# Patient Record
Sex: Male | Born: 1960 | Race: Black or African American | Hispanic: No | Marital: Single | Smoking: Former smoker
Health system: Southern US, Community
[De-identification: ages and names within clinical notes are randomized; demographics above are authoritative.]

## PROBLEM LIST (undated history)

## (undated) DIAGNOSIS — K922 Gastrointestinal hemorrhage, unspecified: Secondary | ICD-10-CM

## (undated) DIAGNOSIS — I517 Cardiomegaly: Secondary | ICD-10-CM

## (undated) DIAGNOSIS — D649 Anemia, unspecified: Secondary | ICD-10-CM

## (undated) DIAGNOSIS — M549 Dorsalgia, unspecified: Secondary | ICD-10-CM

## (undated) DIAGNOSIS — S82892A Other fracture of left lower leg, initial encounter for closed fracture: Secondary | ICD-10-CM

## (undated) DIAGNOSIS — I10 Essential (primary) hypertension: Secondary | ICD-10-CM

## (undated) DIAGNOSIS — N289 Disorder of kidney and ureter, unspecified: Secondary | ICD-10-CM

## (undated) DIAGNOSIS — D696 Thrombocytopenia, unspecified: Secondary | ICD-10-CM

## (undated) DIAGNOSIS — I8500 Esophageal varices without bleeding: Secondary | ICD-10-CM

## (undated) DIAGNOSIS — K759 Inflammatory liver disease, unspecified: Secondary | ICD-10-CM

## (undated) DIAGNOSIS — Z95828 Presence of other vascular implants and grafts: Secondary | ICD-10-CM

## (undated) DIAGNOSIS — D689 Coagulation defect, unspecified: Secondary | ICD-10-CM

## (undated) DIAGNOSIS — K7460 Unspecified cirrhosis of liver: Secondary | ICD-10-CM

## (undated) DIAGNOSIS — F319 Bipolar disorder, unspecified: Secondary | ICD-10-CM

## (undated) HISTORY — PX: HERNIA REPAIR: SHX51

## (undated) HISTORY — DX: Esophageal varices without bleeding: I85.00

## (undated) HISTORY — DX: Unspecified cirrhosis of liver: K74.60

## (undated) HISTORY — DX: Gastrointestinal hemorrhage, unspecified: K92.2

---

## 1989-08-10 HISTORY — PX: EYE SURGERY: SHX253

## 1998-12-13 ENCOUNTER — Emergency Department (HOSPITAL_COMMUNITY): Admission: EM | Admit: 1998-12-13 | Discharge: 1998-12-13 | Payer: Self-pay | Admitting: Emergency Medicine

## 2002-10-08 ENCOUNTER — Emergency Department (HOSPITAL_COMMUNITY): Admission: EM | Admit: 2002-10-08 | Discharge: 2002-10-08 | Payer: Self-pay | Admitting: Emergency Medicine

## 2004-04-10 ENCOUNTER — Emergency Department (HOSPITAL_COMMUNITY): Admission: EM | Admit: 2004-04-10 | Discharge: 2004-04-10 | Payer: Self-pay

## 2008-03-16 ENCOUNTER — Emergency Department (HOSPITAL_COMMUNITY): Admission: EM | Admit: 2008-03-16 | Discharge: 2008-03-16 | Payer: Self-pay | Admitting: Emergency Medicine

## 2008-03-30 ENCOUNTER — Emergency Department (HOSPITAL_COMMUNITY): Admission: EM | Admit: 2008-03-30 | Discharge: 2008-03-30 | Payer: Self-pay | Admitting: Emergency Medicine

## 2008-10-10 DIAGNOSIS — S82892A Other fracture of left lower leg, initial encounter for closed fracture: Secondary | ICD-10-CM

## 2008-10-10 HISTORY — DX: Other fracture of left lower leg, initial encounter for closed fracture: S82.892A

## 2008-10-21 ENCOUNTER — Emergency Department (HOSPITAL_COMMUNITY): Admission: EM | Admit: 2008-10-21 | Discharge: 2008-10-21 | Payer: Self-pay | Admitting: Emergency Medicine

## 2008-11-01 ENCOUNTER — Ambulatory Visit (HOSPITAL_COMMUNITY): Admission: RE | Admit: 2008-11-01 | Discharge: 2008-11-01 | Payer: Self-pay | Admitting: Orthopedic Surgery

## 2010-08-06 ENCOUNTER — Emergency Department (HOSPITAL_COMMUNITY): Admission: EM | Admit: 2010-08-06 | Discharge: 2010-08-06 | Payer: Self-pay | Admitting: Emergency Medicine

## 2011-02-22 LAB — CBC
HCT: 39.9 % (ref 39.0–52.0)
Hemoglobin: 14.2 g/dL (ref 13.0–17.0)
RBC: 4.51 MIL/uL (ref 4.22–5.81)
RDW: 14.8 % (ref 11.5–15.5)

## 2011-02-22 LAB — POCT CARDIAC MARKERS
CKMB, poc: 4.9 ng/mL (ref 1.0–8.0)
Myoglobin, poc: 155 ng/mL (ref 12–200)
Troponin i, poc: <0.05 ng/mL (ref 0.00–0.09)

## 2011-02-22 LAB — POCT I-STAT, CHEM 8
BUN: <3 mg/dL — ABNORMAL LOW (ref 6–23)
Chloride: 108 mEq/L (ref 96–112)
Glucose, Bld: 113 mg/dL — ABNORMAL HIGH (ref 70–99)
HCT: 44 % (ref 39.0–52.0)
Sodium: 145 mEq/L (ref 135–145)

## 2011-02-22 LAB — DIFFERENTIAL
Basophils Relative: 1 % (ref 0–1)
Eosinophils Absolute: 0.1 10*3/uL (ref 0.0–0.7)
Monocytes Absolute: 0.4 10*3/uL (ref 0.1–1.0)
Monocytes Relative: 7 % (ref 3–12)
Neutrophils Relative %: 76 % (ref 43–77)

## 2011-04-24 NOTE — Consult Note (Signed)
Edwin Love, Edwin Love NO.:  [id number]   MEDICAL RECORD NO.:  [id number]          PATIENT TYPE:  EMS   LOCATION:  [facility]                         FACILITY:  [facility]   PHYSICIAN:  Mearl Latin, PA       DATE OF BIRTH:  12-06-1961   DATE OF CONSULTATION:  10/21/2008  DATE OF DISCHARGE:                                 CONSULTATION   REQUESTING PHYSICIAN:  Marisa Severin, MD   REASON FOR CONSULTATION:  Left ankle fracture.   HISTORY OF PRESENT ILLNESS:  [name] is a 50 year old African-  American male who presents to the emergency room today with complaints  of left ankle pain.  [name] states at approximately one week ago  he was walking on his front lawn when he stepped into an uneven surface,  most likely a path hole in the ground and subsequently rolled his ankle  resulting in an injury.  [name] initially thought nothing of it  and did not go to the emergency room, wanted to have it evaluated  despite his mom encouraging him to have it assessed.  Over the ensuing  week, his pain continued to get worse and today, he was walking on it  when he fell again, which reinjured the left ankle.  Currently, Mr.  Rozo presents to the emergency room for evaluation of his injury.  X-  rays of the left ankle obtained in the ED demonstrated a left ankle  fracture.  Currently, [name] states that his pain is approximately  6-7/10.  It is worse with movement and it is located primarily about his  left ankle.  He does get some relief when he is at rest and pain  medications do help his pain.  As noted, his pain has gotten worse over  the ensuing last week and was exacerbated with his recent fall today.  Pain is described as sharp at times around the left ankle.  No radiation  of pain up to his leg or down into his foot.  He does note that his  ankle is swollen as well.  [name] denies any chest pain, shortness  of breath.  No nausea, vomiting, or diarrhea.   No headaches.  No fevers.  No chills.  No recent weight loss.  Positive left ankle pain.   PAST MEDICAL HISTORY:  Significant for uncontrolled hypertension.   MEDICATIONS:  None.   ALLERGIES:  No known drug allergies.   SURGICAL HISTORY:  Traumatic injury to left globe with subsequent  resection.   FAMILY HISTORY:  Mom with diabetes.  Sister with sickle cell anemia.   SOCIAL HISTORY:  Mr. Bolender does use tobacco products and has done so  for many years and does not get a specific timeframe, states that he  used approximately 1 to 2 packs of cigarettes per day and smoke more  when he is drinking.  He does admit to marijuana use.  Chronic alcohol  use, drinks approximately 6 packs per day, and has not had any alcohol  today.  The patient is unemployed, but did work for [employer] in  the past.  The  patient does live at home with his mom.   PHYSICAL EXAMINATION:  VITAL SIGNS:  Temperature 98.1, heart rate 85, BP  160/90, respirations 16 and 100% on room air.  No labs.  GENERAL:  Mr. Staples is a 50 year old African American male who does  appear to be older than his stated age.  He does appear to be well  nourished.  HEENT:  Atraumatic and normocephalic.  Moist mucous membranes.  Poor  dentition.  His left eye has been removed.  Right eye, extraocular  muscles are intact.  NECK:  Supple.  No lymphadenopathy.  No spinous process tenderness.  The  patient demonstrates full range of motion of C-spine.  LUNGS:  Clear to auscultation bilaterally.  HEART:  Regular rate and rhythm.  ABDOMEN:  Soft and nontender.  Positive bowel sounds.  PELVIS:  Stable without pain with AP and lateral compression of ASIS or  iliac crest.  EXTREMITIES:  Bilateral upper extremities, no gross deformities noted.  The patient demonstrates full active range of motion at the hand, wrist,  elbow, and shoulder joints.  Strength is also appropriate at 5/5.  Sensation along the radial, ulnar, and median  nerves is intact to light  touch bilaterally.  Motor function along the radial, ulnar, and median  nerves is also intact.  There is palpable radial pulses bilaterally.  The extremities were warm and skin is appropriate color and temperature.  Right lower extremity, no gross deformities noted.  The patient  demonstrates good range of motion with ankle motion, knee motion, and  hip motion.  No pain is elicited with palpation of the soft tissue of  the joint.  No crepitus or back motion is appreciated with passive  ranging.  Motor and sensory functions on the right leg are also intact  along the deep peroneal nerve, superficial peroneal nerve, and tibial  nerve.  There is a palpable dorsalis pedis pulse.  The extremity is warm  and appropriate color and temperature.  Of note, the patient's nails  demonstrates significant degree of onycholysis.  Left lower extremity,  left hip and left knee are unremarkable.  They are nontender to  palpation, demonstrate good range of motion within satisfactory  limitations.  No tenderness is elicited with palpation of the hip or the  left knee.  Thigh is also nontender to palpation.  Initial inspection of  the left ankle demonstrates a significant swelling with some ecchymosis  appreciated along the lateral aspect.  The patient does have significant  tenderness of palpation over the lateral aspect of the left ankle, which  is more painful than palpation over the medial aspect.  Significant  tenderness elicited with palpation of the lateral malleolus and some  tenderness appreciated with palpation over the distal tib of the medial  malleolus.  No significant tenderness elicited with palpation of the  distal tibia in the midline.  The patient does have pain with gentle  passive range of motion of the left ankle as well.  His skin does not  wrinkle satisfactorily both medial and laterally.  Sensation along the  deep peroneal nerve, superficial peroneal nerve,  and tibial nerve is  intact to light touch.  Extensor hallucis longus and flexor hallucis  longus function is intact.  Grossly anterior tibialis, posterior  tibialis, peroneals, and gastroc are also intact.  There was a palpable  dorsalis pedis and posterior tibialis pulse.  The extremity is warm.  Again, the patient has significant degree of onycholysis of his  toenails.  The patient does not complain of any numbness or tingling.   X-RAYS:  Three views of the left ankle demonstrate a spiral-type  fracture of the left distal fibula with some extension in the fibula  shaft.  There is no obvious fracture of the medial malleolus; however,  there is lateral displacement of the talus with regards to the ankle  mortise.  There is also appreciable avulsion of the lateral talar  process.  There is significant medial clear space widening as well as  tibiofibular overlap approximately 10.2 mm on AP radiographs and no  appreciable overlap on the oblique films.   ASSESSMENT AND PLAN:  This is a 50 year old African American male with a  left ankle fracture/bimalleolar equivalent/supination-external rotation.  OTA classification 44 - B2.  1. The patient was placed in a posterior stirrup splint to hold      reduction in place secondary to soft tissue swelling.  The patient      is not a candidate for open reduction and internal fixation at this      time.  We will have the patient followup in the office      approximately 1 week for reevaluation of the soft tissue.  2. Postreduction films are pending.  3. Nonweightbearing left lower extremity.  The patient will ambulate      with crutches.  4. The patient is fairly comfortable, given delayed presentation;      therefore, we feel that it is unnecessary to admit the patient to      the hospital at this time.  The patient would be more comfortable      at home.  5. Strict ice and elevation.  The patient instructed to elevate his      left lower  extremity above his heart.  The patient was also      instructed on proper splint care.  The patient is to avoid getting      the splint wet and dirty.  I have informed Mr. Cardosa that should      the splint get wet, he runs a risk of splint breakdown as well as      maceration of the skin secondary to wet padding material against      his skin.  6. The patient is also to contact his PCP regarding his elevated blood      pressure.  We will not proceed the surgery until this is fairly      controlled.  7. Provided the patient with a script for Percocet for his pain      control as well as oxycodone for breakthrough pain.  8. The patient will follow up within 1 week.  He is to call our office      to schedule an appointment, number is [phone].  The patient was      seen and evaluated in the emergency room with Dr. Carola Frost.      Mearl Latin, PA     KWP/MEDQ  D:  10/21/2008  T:  10/22/2008  Job:  [id number]

## 2011-09-04 LAB — RAPID URINE DRUG SCREEN, HOSP PERFORMED
Benzodiazepines: NOT DETECTED
Cocaine: NOT DETECTED
Opiates: NOT DETECTED

## 2011-09-04 LAB — DIFFERENTIAL
Basophils Absolute: 0
Basophils Relative: 0
Eosinophils Absolute: 0.1
Eosinophils Relative: 1
Lymphs Abs: 1.7
Monocytes Absolute: 0.4
Monocytes Relative: 4

## 2011-09-04 LAB — BASIC METABOLIC PANEL
BUN: 32 — ABNORMAL HIGH
CO2: 20
Creatinine, Ser: 1.07
GFR calc Af Amer: >60
Glucose, Bld: 152 — ABNORMAL HIGH

## 2011-09-04 LAB — CBC
Hemoglobin: 13.2
WBC: 10.9 — ABNORMAL HIGH

## 2011-09-04 LAB — PROTIME-INR: Prothrombin Time: 17.6 — ABNORMAL HIGH

## 2011-09-11 LAB — COMPREHENSIVE METABOLIC PANEL
ALT: 41
AST: 77 — ABNORMAL HIGH
Alkaline Phosphatase: 113
CO2: 25
Chloride: 107
Creatinine, Ser: 0.8
GFR calc Af Amer: >60
Sodium: 140
Total Bilirubin: 1.3 — ABNORMAL HIGH

## 2011-09-11 LAB — CBC
Hemoglobin: 12.6 — ABNORMAL LOW
MCHC: 32.7
MCV: 85.2
RBC: 4.52
RDW: 18.2 — ABNORMAL HIGH

## 2011-09-11 LAB — URINE CULTURE: Colony Count: 25000

## 2011-09-11 LAB — APTT: aPTT: 37

## 2011-09-11 LAB — DIFFERENTIAL
Eosinophils Absolute: 0.1
Lymphs Abs: 2.4
Monocytes Absolute: 0.8
Neutro Abs: 7.9 — ABNORMAL HIGH
Neutrophils Relative %: 70

## 2011-09-11 LAB — URINALYSIS, ROUTINE W REFLEX MICROSCOPIC
Specific Gravity, Urine: 1.02
pH: 6

## 2011-09-11 LAB — URINE MICROSCOPIC-ADD ON

## 2012-09-20 ENCOUNTER — Emergency Department (HOSPITAL_COMMUNITY): Payer: Self-pay

## 2012-09-20 ENCOUNTER — Encounter (HOSPITAL_COMMUNITY): Payer: Self-pay | Admitting: Emergency Medicine

## 2012-09-20 ENCOUNTER — Emergency Department (HOSPITAL_COMMUNITY)
Admission: EM | Admit: 2012-09-20 | Discharge: 2012-09-20 | Disposition: A | Discharge status: Home / Self Care | Payer: Self-pay | Attending: Emergency Medicine | Admitting: Emergency Medicine

## 2012-09-20 DIAGNOSIS — Z8781 Personal history of (healed) traumatic fracture: Secondary | ICD-10-CM | POA: Insufficient documentation

## 2012-09-20 DIAGNOSIS — M79673 Pain in unspecified foot: Secondary | ICD-10-CM

## 2012-09-20 DIAGNOSIS — M79609 Pain in unspecified limb: Secondary | ICD-10-CM | POA: Insufficient documentation

## 2012-09-20 HISTORY — DX: Essential (primary) hypertension: I10

## 2012-09-20 MED ORDER — IBUPROFEN 600 MG PO TABS: 600.0000 mg | ORAL_TABLET | Freq: Four times a day (QID) | ORAL | Status: DC | PRN

## 2012-09-20 MED ORDER — HYDROCODONE-ACETAMINOPHEN 5-325 MG PO TABS: 1.0000 | ORAL_TABLET | Freq: Four times a day (QID) | ORAL | Status: DC | PRN

## 2012-09-20 NOTE — ED Notes (Signed)
Pt returned from radiology.

## 2012-09-20 NOTE — ED Provider Notes (Signed)
History    This chart was scribed for Edwin Kaplan, MD, MD by Smitty Pluck. The patient was seen in room TR09C and the patient's care was started at 7:16PM.  CSN: [id number]  Arrival date & time 09/20/12  1423    Chief Complaint  Patient presents with  . Foot Pain    (Consider location/radiation/quality/duration/timing/severity/associated sxs/prior treatment) Patient is a 51 y.o. male presenting with lower extremity pain. The history is provided by the patient. No language interpreter was used.  Foot Pain   URHO RIO is a 51 y.o. male who presents to the Emergency Department complaining of constant, moderate left foot pain in the bunion region and first 2-3 toes onset  Week. Bearing weight aggravates the pain. Pt reports that he has had calluses removed and tried foot cream without relief. Denies hx of substance abuse, liver disease and gout . Pt reports that he drinks beer daily and smokes cigarettes. Reports having intermittent, right wrist pain. Denies any other pain.    Past Medical History  Diagnosis Date  . Hypertension     History reviewed. No pertinent past surgical history.  No family history on file.  History  Substance Use Topics  . Smoking status: Current Every Day Smoker  . Smokeless tobacco: Not on file  . Alcohol Use: Yes      Review of Systems  Musculoskeletal: Positive for joint swelling.  All other systems reviewed and are negative.    Allergies  Review of patient's allergies indicates no known allergies.  Home Medications  No current outpatient prescriptions on file.  BP 144/95  Pulse 69  Temp 98.1 F (36.7 C) (Oral)  Resp 20  SpO2 91%  Physical Exam  Nursing note and vitals reviewed. Constitutional: He is oriented to person, place, and time. He appears well-developed and well-nourished. No distress.  HENT:  Head: Normocephalic and atraumatic.  Neck: Normal range of motion.  Cardiovascular: Normal rate, regular rhythm  and normal heart sounds.   No murmur heard. Pulmonary/Chest: Effort normal. No respiratory distress.  Musculoskeletal:       Right wrist on dorsal aspect 2-3 cm lesion soft mobile and moves with flexion and extension of wrist. +2 radial pulse  +2 DP  Tenderness over metatarsal MTP joint and base of foot No erythema No crepitus No fluctuance The left great toe is nontender to palpation Left great toe 2 and 3 are tender to palpation but without erythema   Neurological: He is alert and oriented to person, place, and time.  Skin: Skin is warm and dry.  Psychiatric: He has a normal mood and affect. His behavior is normal.    ED Course  Procedures (including critical care time) DIAGNOSTIC STUDIES: Oxygen Saturation is 91% on room air, normal by my interpretation.    COORDINATION OF CARE: 7:23 PM Discussed ED treatment with pt     Labs Reviewed - No data to display No results found.   No diagnosis found.    MDM  Medical screening examination/treatment/procedure(s) were performed by me as the supervising physician. Scribe service was utilized for documentation only.  Pt exam is benign, but he has some risk factors for infection and we will get Xray to ensure there is no underlying pathology.         Edwin Kaplan, MD 10/11/12 [phone]

## 2012-09-20 NOTE — ED Notes (Signed)
Pt. Stated, I've had foot pain for a week or so.  I went to [facility]. He took some calluses off but its still hurting anytime I put pressure on it.

## 2012-09-20 NOTE — Progress Notes (Signed)
Orthopedic Tech Progress Note Patient Details:  Edwin Love 29-Aug-1961 [id number]  Ortho Devices Type of Ortho Device: Postop boot Ortho Device/Splint Location: left foot Ortho Device/Splint Interventions: Application   [name] 09/20/2012, 9:08 PM

## 2012-09-21 NOTE — ED Provider Notes (Signed)
Edwin Love S   8:20PM Pt discussed in sign out with Dr. Rhunette Croft.  Pt with complaints of foot pain.  X-rays pending.  X-rays with no new acute injury.  Pt with old fx.  Pt stable for d/c home.    Angus Seller, [place] 09/21/12 2224

## 2012-09-22 NOTE — ED Provider Notes (Signed)
Medical screening examination/treatment/procedure(s) were performed by non-physician practitioner and as supervising physician I was immediately available for consultation/collaboration.  Doug Sou, MD 09/22/12 [phone]

## 2014-01-10 DIAGNOSIS — D649 Anemia, unspecified: Secondary | ICD-10-CM

## 2014-01-10 HISTORY — DX: Anemia, unspecified: D64.9

## 2014-01-21 ENCOUNTER — Encounter (HOSPITAL_COMMUNITY): Payer: Self-pay | Admitting: Emergency Medicine

## 2014-01-21 ENCOUNTER — Other Ambulatory Visit: Payer: Self-pay

## 2014-01-21 ENCOUNTER — Emergency Department (HOSPITAL_COMMUNITY)
Admission: EM | Admit: 2014-01-21 | Discharge: 2014-01-21 | Disposition: A | Discharge status: Home / Self Care | Payer: Medicaid Other | Attending: Emergency Medicine | Admitting: Emergency Medicine

## 2014-01-21 ENCOUNTER — Emergency Department (HOSPITAL_COMMUNITY): Payer: Medicaid Other

## 2014-01-21 DIAGNOSIS — Z8679 Personal history of other diseases of the circulatory system: Secondary | ICD-10-CM | POA: Insufficient documentation

## 2014-01-21 DIAGNOSIS — R079 Chest pain, unspecified: Secondary | ICD-10-CM | POA: Insufficient documentation

## 2014-01-21 DIAGNOSIS — R0602 Shortness of breath: Secondary | ICD-10-CM | POA: Insufficient documentation

## 2014-01-21 DIAGNOSIS — Z791 Long term (current) use of non-steroidal anti-inflammatories (NSAID): Secondary | ICD-10-CM | POA: Insufficient documentation

## 2014-01-21 DIAGNOSIS — F1210 Cannabis abuse, uncomplicated: Secondary | ICD-10-CM | POA: Insufficient documentation

## 2014-01-21 DIAGNOSIS — F17200 Nicotine dependence, unspecified, uncomplicated: Secondary | ICD-10-CM | POA: Insufficient documentation

## 2014-01-21 DIAGNOSIS — D649 Anemia, unspecified: Secondary | ICD-10-CM

## 2014-01-21 DIAGNOSIS — I10 Essential (primary) hypertension: Secondary | ICD-10-CM | POA: Insufficient documentation

## 2014-01-21 DIAGNOSIS — N289 Disorder of kidney and ureter, unspecified: Secondary | ICD-10-CM | POA: Insufficient documentation

## 2014-01-21 DIAGNOSIS — D509 Iron deficiency anemia, unspecified: Secondary | ICD-10-CM | POA: Insufficient documentation

## 2014-01-21 DIAGNOSIS — I517 Cardiomegaly: Secondary | ICD-10-CM

## 2014-01-21 DIAGNOSIS — R040 Epistaxis: Secondary | ICD-10-CM | POA: Insufficient documentation

## 2014-01-21 DIAGNOSIS — M549 Dorsalgia, unspecified: Secondary | ICD-10-CM | POA: Insufficient documentation

## 2014-01-21 HISTORY — DX: Dorsalgia, unspecified: M54.9

## 2014-01-21 LAB — RPR: RPR: NONREACTIVE

## 2014-01-21 LAB — CBC
HEMATOCRIT: 29.3 % — AB (ref 39.0–52.0)
HEMOGLOBIN: 10.3 g/dL — AB (ref 13.0–17.0)
MCH: 25.3 pg — AB (ref 26.0–34.0)
MCHC: 35.2 g/dL (ref 30.0–36.0)
MCV: 72 fL — ABNORMAL LOW (ref 78.0–100.0)
Platelets: 315 10*3/uL (ref 150–400)
RBC: 4.07 MIL/uL — AB (ref 4.22–5.81)
RDW: 17.9 % — ABNORMAL HIGH (ref 11.5–15.5)
WBC: 9.3 10*3/uL (ref 4.0–10.5)

## 2014-01-21 LAB — BASIC METABOLIC PANEL
BUN: 16 mg/dL (ref 6–23)
BUN: 17 mg/dL (ref 6–23)
CALCIUM: 8.5 mg/dL (ref 8.4–10.5)
CHLORIDE: 108 meq/L (ref 96–112)
CO2: 13 meq/L — AB (ref 19–32)
CO2: 16 mEq/L — ABNORMAL LOW (ref 19–32)
Calcium: 8.2 mg/dL — ABNORMAL LOW (ref 8.4–10.5)
Chloride: 106 mEq/L (ref 96–112)
Creatinine, Ser: 1.4 mg/dL — ABNORMAL HIGH (ref 0.50–1.35)
Creatinine, Ser: 1.41 mg/dL — ABNORMAL HIGH (ref 0.50–1.35)
GFR calc Af Amer: 65 mL/min — ABNORMAL LOW (ref >90)
GFR calc non Af Amer: 56 mL/min — ABNORMAL LOW (ref >90)
GFR, EST AFRICAN AMERICAN: 64 mL/min — AB (ref >90)
GFR, EST NON AFRICAN AMERICAN: 55 mL/min — AB (ref >90)
GLUCOSE: 84 mg/dL (ref 70–99)
Glucose, Bld: 109 mg/dL — ABNORMAL HIGH (ref 70–99)
POTASSIUM: 4.1 meq/L (ref 3.7–5.3)
POTASSIUM: 4.3 meq/L (ref 3.7–5.3)
SODIUM: 137 meq/L (ref 137–147)
SODIUM: 139 meq/L (ref 137–147)

## 2014-01-21 LAB — POCT I-STAT TROPONIN I
TROPONIN I, POC: 0.02 ng/mL (ref 0.00–0.08)
TROPONIN I, POC: 0.01 ng/mL (ref 0.00–0.08)

## 2014-01-21 LAB — PRO B NATRIURETIC PEPTIDE: Pro B Natriuretic peptide (BNP): 350.3 pg/mL — ABNORMAL HIGH (ref 0–125)

## 2014-01-21 LAB — OCCULT BLOOD, POC DEVICE: FECAL OCCULT BLD: NEGATIVE

## 2014-01-21 LAB — CG4 I-STAT (LACTIC ACID): Lactic Acid, Venous: 1.12 mmol/L (ref 0.5–2.2)

## 2014-01-21 LAB — SALICYLATE LEVEL

## 2014-01-21 LAB — ETHANOL

## 2014-01-21 MED ORDER — ASPIRIN 81 MG PO CHEW: 81.0000 mg | CHEWABLE_TABLET | Freq: Every day | ORAL | Status: DC

## 2014-01-21 MED ORDER — SODIUM CHLORIDE 0.9 % IV BOLUS (SEPSIS)
1000.0000 mL | Freq: Once | INTRAVENOUS | Status: AC
  Administered 2014-01-21: 1000 mL via INTRAVENOUS

## 2014-01-21 MED ORDER — SODIUM CHLORIDE 0.9 % IV BOLUS (SEPSIS)
1000.0000 mL | Freq: Once | INTRAVENOUS | Status: AC
Start: 2014-01-21 — End: 2014-01-21
  Administered 2014-01-21: 1000 mL via INTRAVENOUS

## 2014-01-21 MED ORDER — FERROUS SULFATE 325 (65 FE) MG PO TABS: 325.0000 mg | ORAL_TABLET | Freq: Every day | ORAL | Status: DC

## 2014-01-21 NOTE — ED Provider Notes (Signed)
CSN: [id number]     Arrival date & time 01/21/14  1242 History   First MD Initiated Contact with Patient 01/21/14 1519     Chief Complaint  Patient presents with  . Chest Pain  . Epistaxis     (Consider location/radiation/quality/duration/timing/severity/associated sxs/prior Treatment) Patient is a 53 y.o. male presenting with chest pain and nosebleeds.  Chest Pain Epistaxis  53 yo male presents today with Nose bleed that started 2 days ago. Patient has hx of nose bleeds. Bleeding currently resolved but patient states he sees little specs of blood when he blows his nose. Patient states he blows his nose a lot. Admits to runny nose, sore throat, chills, and productive cough with green sputum production. Cold sxs reported to be ongoing for 10 days. Patient also admits to chest pain over the past couple days as well. Describes pressure pain that comes and goes. Patient states pain is same with activity as it is with rest. Pain worsened by movement and lifting. Patient admits to chronic shortness of breath with activity "for years" that is unchanged since recent symptoms. Patient admits to similar chest pains in the past.  PMH significant for back pain, HTN, LEFT leg pain.   Advanced age > 22 yo: Yes HTN: Yes Hyperlipidemia: No Cigarette smoking: Yes Diabetes Mellitus: No Family hx of CAD or MI < 73 yo : No Male or Post menopausal: Yes Cocaine use: No Prior MI: No CABG: No Stress test: No Angina: Yes      Past Medical History  Diagnosis Date  . Hypertension   . Back pain    History reviewed. No pertinent past surgical history. History reviewed. No pertinent family history. History  Substance Use Topics  . Smoking status: Current Every Day Smoker  . Smokeless tobacco: Not on file  . Alcohol Use: Yes    Review of Systems  HENT: Positive for nosebleeds.   Cardiovascular: Positive for chest pain.  All other systems reviewed and are negative.      Allergies  Review  of patient's allergies indicates no known allergies.  Home Medications   Current Outpatient Rx  Name  Route  Sig  Dispense  Refill  . ibuprofen (ADVIL,MOTRIN) 600 MG tablet   Oral   Take 1,200 mg by mouth every 6 (six) hours as needed (back pain).         Marland Kitchen PRESCRIPTION MEDICATION   Oral   Take 1 tablet by mouth daily. Depression medication         . aspirin 81 MG chewable tablet   Oral   Chew 1 tablet (81 mg total) by mouth daily.   30 tablet   0   . ferrous sulfate 325 (65 FE) MG tablet   Oral   Take 1 tablet (325 mg total) by mouth daily.   30 tablet   0    BP 124/76  Pulse 98  Temp(Src) 97.9 F (36.6 C) (Oral)  Resp 21  Ht 5\' 9"  (1.753 m)  Wt 190 lb (86.183 kg)  BMI 28.05 kg/m2  SpO2 100% Physical Exam  Nursing note and vitals reviewed. Constitutional: He is oriented to person, place, and time. He appears well-developed and well-nourished. No distress.  HENT:  Head: Normocephalic and atraumatic.  Eyes: Conjunctivae are normal.  Neck: No JVD present. No tracheal deviation present.  Cardiovascular: Normal rate and regular rhythm.  Exam reveals no gallop and no friction rub.   No murmur heard. Pulmonary/Chest: Effort normal. No respiratory distress. He has  no wheezes. He has no rhonchi. He has no rales.  Abdominal: Soft. There is no tenderness.  Musculoskeletal: Normal range of motion. He exhibits no edema.  Neurological: He is alert and oriented to person, place, and time.  Skin: Skin is warm and dry. He is not diaphoretic.  Psychiatric: He has a normal mood and affect. His behavior is normal.    ED Course  Procedures (including critical care time) Labs Review Labs Reviewed  CBC - Abnormal; Notable for the following:    RBC 4.07 (*)    Hemoglobin 10.3 (*)    HCT 29.3 (*)    MCV 72.0 (*)    MCH 25.3 (*)    RDW 17.9 (*)    All other components within normal limits  BASIC METABOLIC PANEL - Abnormal; Notable for the following:    CO2 13 (*)     Creatinine, Ser 1.40 (*)    GFR calc non Af Amer 56 (*)    GFR calc Af Amer 65 (*)    All other components within normal limits  PRO B NATRIURETIC PEPTIDE - Abnormal; Notable for the following:    Pro B Natriuretic peptide (BNP) 350.3 (*)    All other components within normal limits  BASIC METABOLIC PANEL - Abnormal; Notable for the following:    CO2 16 (*)    Glucose, Bld 109 (*)    Creatinine, Ser 1.41 (*)    Calcium 8.2 (*)    GFR calc non Af Amer 55 (*)    GFR calc Af Amer 64 (*)    All other components within normal limits  SALICYLATE LEVEL - Abnormal; Notable for the following:    Salicylate Lvl <1.6 (*)    All other components within normal limits  RPR  ETHANOL  POCT I-STAT TROPONIN I  OCCULT BLOOD, POC DEVICE  CG4 I-STAT (LACTIC ACID)  POCT I-STAT TROPONIN I   Imaging Review Dg Chest 2 View  01/21/2014   CLINICAL DATA:  Chest pain.  EXAM: CHEST  2 VIEW  COMPARISON:  August 06, 2010.  FINDINGS: The heart size and mediastinal contours are within normal limits. Both lungs are clear. No pneumothorax or pleural effusion is noted. The visualized skeletal structures are unremarkable.  IMPRESSION: No active cardiopulmonary disease.   Electronically Signed   By: Sabino Dick M.D.   On: 01/21/2014 13:36    EKG Interpretation    Date/Time:  Thursday January 21 2014 12:51:02 EST Ventricular Rate:  112 PR Interval:  120 QRS Duration: 102 QT Interval:  352 QTC Calculation: 480 R Axis:   79 Text Interpretation:  Sinus tachycardia Right atrial enlargement Left ventricular hypertrophy with repolarization abnormality Abnormal ECG Since last tracing rate faster Confirmed by MILLER  MD, BRIAN ([id number]) on 01/21/2014 3:23:36 PM            MDM   Final diagnoses:  Chest pain  Epistaxis  Renal insufficiency  Anemia  LVH (left ventricular hypertrophy)   EKG shows sinus tach with LVH. CXR negative Troponin negative BNP mildy elevated at 350.3.  Low CO2 with associated anion  gap of 18  Cr elevated to 1.4 Anemia at 10.3, Hemmoccult negative. No other obvious signs of bleeding. Plan to have rechecked in 1-2 days.  Lactic acid < 4 Salicylate level negative Ethanol level negative Repeat BMP shows improved anion gap of 15 after IV fluids.   Patient states he is read to go. Patient appears stable for discharge.  Discussed findings with patient. Advised  patient to start iron supplementation as well as baby aspirin daily. Advised follow up in 1-2 days to recheck Hgb either at PCP or ED. Patient agrees with plan.  Meds given in ED:  Medications  sodium chloride 0.9 % bolus 1,000 mL (0 mLs Intravenous Stopped 01/21/14 1718)  sodium chloride 0.9 % bolus 1,000 mL (0 mLs Intravenous Stopped 01/21/14 1907)    Discharge Medication List as of 01/21/2014  7:05 PM    START taking these medications   Details  aspirin 81 MG chewable tablet Chew 1 tablet (81 mg total) by mouth daily., Starting 01/21/2014, Until Discontinued, Print    ferrous sulfate 325 (65 FE) MG tablet Take 1 tablet (325 mg total) by mouth daily., Starting 01/21/2014, Until Discontinued, Print          Dossie Arbour [place], [place] 01/23/14 [phone]

## 2014-01-21 NOTE — Discharge Instructions (Signed)
Cardiology will call you today to set up an appointment for outpatient stress test. Get your hemoglobin rechecked in 1-2 days to assess for any acute significant changes in hemoglobin, either at your primary care provider or return to emergency department for repeat testing. If you develop progressive chest pain or shortness of breath please return to the ED immediately. Recommend start iron supplement as prescribed for your anemia. Start 81 mg aspirin therapy daily.    Emergency Department Resource Guide 1) Find a Doctor and Pay Out of Pocket Although you won't have to find out who is covered by your insurance plan, it is a good idea to ask around and get recommendations. You will then need to call the office and see if the doctor you have chosen will accept you as a new patient and what types of options they offer for patients who are self-pay. Some doctors offer discounts or will set up payment plans for their patients who do not have insurance, but you will need to ask so you aren't surprised when you get to your appointment.  2) Contact Your Local Health Department Not all health departments have doctors that can see patients for sick visits, but many do, so it is worth a call to see if yours does. If you don't know where your local health department is, you can check in your phone book. The CDC also has a tool to help you locate your state's health department, and many state websites also have listings of all of their local health departments.  3) Find a [facility] If your illness is not likely to be very severe or complicated, you may want to try a walk in clinic. These are popping up all over the country in pharmacies, drugstores, and shopping centers. They're usually staffed by nurse practitioners or physician assistants that have been trained to treat common illnesses and complaints. They're usually fairly quick and inexpensive. However, if you have serious medical issues or chronic medical  problems, these are probably not your best option.  No Primary Care Doctor: - Call Health Connect at  [phone] - they can help you locate a primary care doctor that  accepts your insurance, provides certain services, etc. - Physician Referral Service- [phone]  Chronic Pain Problems: Organization         Address  Phone   Notes  [facility]  [phone] Patients need to be referred by their primary care doctor.   Medication Assistance: Organization         Address  Phone   Notes  [facility] Medication [facility] [place], [place] [address] [phone] --Must be a resident of [facility] -- Must have NO insurance coverage whatsoever (no Medicaid/ Medicare, etc.) -- The pt. MUST have a primary care doctor that directs their care regularly and follows them in the community   MedAssist  [phone]   [employer]  [phone]    Agencies that provide inexpensive medical care: Organization         Address  Phone   Notes  [place]  [phone]   [facility]    [phone]   [facility] [place], [place] [address] [phone]   [place] [address] [place] [phone]   Planned Parenthood    [phone]   [facility]    [phone]  Community Health and [place]  [place] [address], [place] Phone:  [phone], Fax:  [phone] Hours of Operation:  9 am - 6 pm, M-F.  Also accepts Medicaid/Medicare and self-pay.  [facility] for [place] [address], [address], [place] Phone: [phone], Fax: [phone]. Hours of Operation:  8:30 am - 5:30 pm, M-F.  Also accepts Medicaid and self-pay.  [facility] [place] [address] [place] Phone: [phone]   [place], [place], [place] [phone], Ext. [phone]  Mondays & Thursdays: 7-9 AM.  First 15 patients are seen on a first come, first serve basis.    [place] Providers:  Organization         Address  Phone   Notes  [facility] [address], [place] [phone] Also accepts self-pay patients.  [facility] [address], [place]  [phone]   [place], [address], [place] [phone]   [facility] [address], [place] [phone]   Lucianne Lei [address], [place]   [phone] Only accepts [place] Access [place] patients after they have their name applied to their card.   Self-Pay (no insurance) in [facility]:  Organization         Address  Phone   Notes  Sickle Cell Patients, [facility] [place] [phone]   [facility] Urgent Care [place] [phone]   [facility] [place]  [place], [place], [place] [phone]   [facility]/Dr. Osei-Bonsu  [address] [place] or [address], [place] [phone] Phone number for both [place] and [place] locations is the same.  Urgent Medical and [facility] [address], [place] [phone]   [facility] [place] [address], [place] or [address] [phone]   [facility] [address] [place] [phone], phone; [phone], fax Sees patients 1st and 3rd Saturday of every month.  Must not qualify for public or private insurance (i.e. Medicaid, Medicare, [place] Health Choice, Veterans' Benefits)  Household income should be no more than 200% of the poverty level The clinic cannot treat you if you are pregnant or think you are pregnant  Sexually transmitted diseases are not treated at  the clinic.    Dental Care: Organization         Address  Phone  Notes  [facility] Department of [facility] [place] [phone] Accepts children up to age 25 who are enrolled in [place] or [place]; pregnant women with a Medicaid card; and children who have applied for Medicaid or [place] Health Choice, but were declined, whose parents can pay a reduced fee at time of service.  [facility] Department of [facility]  [address], [place] [phone] Accepts children up to age 67 who are enrolled in [place] or [place]; pregnant women with a Medicaid card; and children who have applied for Medicaid or  Health Choice, but were declined, whose parents can pay a reduced fee at time of service.  [place]  [phone]  [place] [phone] Patients are seen by appointment only. Walk-ins are not accepted. [place] will see patients 46 years of age and older. Monday - Tuesday (8am-5pm) Most Wednesdays (8:30-5pm) $30 per visit, cash only  [facility] Adult Dental Access PROGRAM  [address], [facility] [phone] Patients are seen by appointment only. Walk-ins are not accepted. [place] will see patients 10 years of age and older. One Wednesday Evening (Monthly: Volunteer Based).  $30 per visit, cash only  [place]  [phone] for adults; Children under age 30, call Graduate Pediatric Dentistry at [phone]. Children aged 7-14, please call [phone] to request a pediatric application.  Dental services are provided in all areas of dental care including fillings, crowns and bridges, complete and partial dentures, implants, gum treatment, root canals, and extractions. Preventive care is also provided. Treatment is provided to both adults and children. Patients are selected via a lottery and there is often a  waiting list.   [facility] [address], [place]  [phone] [email or web address]   [facility] [address] [place], [place] [phone], Ext. [phone] Second and Fourth Thursday of each month, opens at 6:30 AM; Clinic ends at 9 AM.  Patients are seen on a first-come first-served basis, and a limited number are seen during each clinic.   [facility]  [address] Hillard Danker [place], [place] [phone]   Eligibility Requirements You must have lived in [place], [place], or [place] counties for at least the last three months.   You cannot be eligible for state or federal sponsored [employer], including [employer], [place], or [employer].   You generally cannot be eligible for healthcare insurance through your employer.    How to apply: Eligibility screenings are held every Tuesday and Wednesday afternoon from 1:00 pm until 4:00 pm. You do not need an appointment for the interview!  [facility] [address] [place], [place]   [place]  [place] Department  [place]  [phone]    Behavioral Health Resources in the Community: Intensive Outpatient Programs Organization         Address  Phone  Notes  [place] [place], [place] [phone]   [facility] Outpatient [address] [place], [place]   ADS: Alcohol & Drug S[facility]cs [address] [place], [place]   [place] [address]  [place], [place] or [phone]   Substance Abuse Resources Organization         Address  Phone  Notes  Alcohol and Drug Services  [phone]   [place]  [phone]   [facility] [place]   Chinita Pester  [phone]   Residential & Outpatient Substance Abuse  Program  [phone]   Psychological Services Organization         Address  Phone  Notes  [facility] [place]  [place]  [phone]   [place] [address], [place] or [phone]    Mobile Crisis Teams Organization         Address  Phone  Notes  [facility], Mobile Crisis Care Unit  [phone]   [facility] Psychotherapeutic [facility]  [address] [place], [place]  [facility] [address], Ste [place] [phone]    Self-Help/Support Groups Organization         Address  Phone             Notes  Mental Health Assoc. of [place] - variety of support groups  [place] Call for more information  Narcotics Anonymous (NA), [facility] [address], [employer] [place]  2 meetings at this location   Special educational needs teacher         Address  Phone  Notes  ASAP Residential Treatment [place],    [place]  [phone]   [facility]  [address] [place] [address], [place], [place]   [place], [place] [phone] Admissions: 8am-3pm M-F  Incentives Substance [place] [address],    [place], [place] [phone]   [facility] [address] [place], [place], [place]   [facility] [address]  [place], [place]   [facility] - Intensive Outpatient [address]een [address], [place], [place]   [facility] ([place].,  [place], [place] [phone] or [phone]   [facility] (RTS) [address], [place], [place] Accepts Medicaid  [facility] [place] [address]  [place] [phone] Substance Abuse/Addiction Treatment   [facility] Organization          Address  Phone  Notes  [facility]  [phone]   Domenic Schwab, PhD [address] [place]lis [place], [place]   2818698019 or [phone]   [place], [place] [phone]   [facility] [address] [place], [place] [phone] Insurance/Medicaid/sponsorship through [facility] and Families [address] [place]                                    [place], [place] [phone] [place] [address], [place] [phone]    Dr. Adele Schilder  [phone]   [facility] Dept. 1) [address] [place] 2) [place] 3)  [place] [address], [place] [phone]  [phone]   [place] [phone] or [phone] (After Hours)

## 2014-01-21 NOTE — ED Notes (Signed)
Pt reports onset two days ago of mid chest tightness, sob and nosebleeds. Airway is intact, ekg done at triage.

## 2014-01-21 NOTE — ED Notes (Signed)
Chaperoned Bernadene Bell, PA in Hemoccult collection

## 2014-01-21 NOTE — ED Notes (Signed)
PA at bedside.

## 2014-01-24 NOTE — ED Provider Notes (Signed)
53 year old male, history of hypertension and tobacco use who presents with multiple complaints  #1 nosebleeding, states that he has been having sneezing dry cough and specks of blood when he blows his nose over the last several days. This never bleed spontaneously, it is always small volume, he has no nose pain, sinus pain or congestion associated with this.  #2 chest pain, the patient states that he has been having intermittent chest pain for several months, he describes this as a feeling of pressure on the left side of his chest, he is always short lived, and always happens when he exerts himself, it always resolves very quickly and he is chest pain-free at this time. In fact he has had very little chest pain over the last couple of days. The patient denies any lower extremities swelling, immobilization, trauma, travel, hormone use. He has no history of DVT or PE, no history of cardiac disease he does have a history of left ventricular hypertrophy that he describes as "they told me I had an enlarged heart and that was because of a high blood pressure"  The patient's EKG is consistent with left ventricular hypertrophy, mild sinus tachycardia, repolarization abnormalities are present however the EKG is unchanged since August of 2011. His chest x-ray as interpreted by myself shows no signs of infiltrate, pneumothorax or abnormal mediastinum. Labwork ordered and will be interpreted, troponin pending, again the patient is chest pain-free and has been most of the day today. His symptoms are only when he exerts himself inactivity such as raking the leaves, doing chores around the house. He has never had a stress test, no history of heart catheterization, no history of heart attack that he knows of.  I discussed his care with Cecilie Kicks of cardiology who will call the patient today to make an appointment for a cardiology followup this week and possible stress test. The patient is in agreement with this  plan  5:15 PM, the patient has an increased anion gap acidosis, will obtain alcohol level, salicylate level, 1 L of fluid given, repeat troponin, patient still appears benign, has no chest pain, no shortness of breath, no nose bleeding while he has been here.  EKG Interpretation    Date/Time:  Thursday January 21 2014 12:51:02 EST Ventricular Rate:  112 PR Interval:  120 QRS Duration: 102 QT Interval:  352 QTC Calculation: 480 R Axis:   79 Text Interpretation:  Sinus tachycardia Right atrial enlargement Left ventricular hypertrophy with repolarization abnormality Abnormal ECG Since last tracing rate faster Confirmed by Darald Uzzle  MD, Deneene Tarver ([id number]) on 01/21/2014 3:23:36 PM             Medical screening examination/treatment/procedure(s) were conducted as a shared visit with non-physician practitioner(s) and myself.  I personally evaluated the patient during the encounter.  Clinical Impression: Chest Pain, bronchitis, epistaxis, anemia, renal insufficiency     Johnna Acosta, MD 01/24/14 0003

## 2014-01-29 ENCOUNTER — Ambulatory Visit (INDEPENDENT_AMBULATORY_CARE_PROVIDER_SITE_OTHER): Payer: Medicaid Other | Admitting: Cardiology

## 2014-01-29 ENCOUNTER — Encounter: Payer: Self-pay | Admitting: Cardiology

## 2014-01-29 VITALS — BP 162/110 | HR 112 | Ht 69.0 in | Wt 176.0 lb

## 2014-01-29 DIAGNOSIS — R0609 Other forms of dyspnea: Secondary | ICD-10-CM

## 2014-01-29 DIAGNOSIS — N183 Chronic kidney disease, stage 3 unspecified: Secondary | ICD-10-CM

## 2014-01-29 DIAGNOSIS — F329 Major depressive disorder, single episode, unspecified: Secondary | ICD-10-CM

## 2014-01-29 DIAGNOSIS — R0989 Other specified symptoms and signs involving the circulatory and respiratory systems: Secondary | ICD-10-CM

## 2014-01-29 DIAGNOSIS — R079 Chest pain, unspecified: Secondary | ICD-10-CM

## 2014-01-29 DIAGNOSIS — Z720 Tobacco use: Secondary | ICD-10-CM | POA: Insufficient documentation

## 2014-01-29 DIAGNOSIS — F32A Depression, unspecified: Secondary | ICD-10-CM

## 2014-01-29 DIAGNOSIS — R000 Tachycardia, unspecified: Secondary | ICD-10-CM | POA: Insufficient documentation

## 2014-01-29 DIAGNOSIS — F3289 Other specified depressive episodes: Secondary | ICD-10-CM

## 2014-01-29 DIAGNOSIS — R0600 Dyspnea, unspecified: Secondary | ICD-10-CM

## 2014-01-29 DIAGNOSIS — F17200 Nicotine dependence, unspecified, uncomplicated: Secondary | ICD-10-CM

## 2014-01-29 MED ORDER — CARVEDILOL 12.5 MG PO TABS: 12.5000 mg | ORAL_TABLET | Freq: Two times a day (BID) | ORAL | Status: DC

## 2014-01-29 MED ORDER — LISINOPRIL 10 MG PO TABS: 10.0000 mg | ORAL_TABLET | Freq: Every day | ORAL | Status: DC

## 2014-01-29 NOTE — Patient Instructions (Signed)
Your physician has recommended you make the following change in your medication:   1. Carvedilol 12.5mg  twice daily. 2. Lisinopril 10 mg daily.  Your physician has requested that you have an echocardiogram. Echocardiography is a painless test that uses sound waves to create images of your heart. It provides your doctor with information about the size and shape of your heart and how well your heart's chambers and valves are working. This procedure takes approximately one hour. There are no restrictions for this procedure.  Your physician has requested that you have a lexiscan myoview. For further information please visit [email or web address]. Please follow instruction sheet, as given.  Your physician recommends that you return for lab work in: 2 weeks, BMET  Your physician recommends that you schedule a follow-up appointment in: 2 weeks with Dr. Marlou Porch

## 2014-01-29 NOTE — Progress Notes (Signed)
[place]. [address], [address] [place], [place]  [address] Phone: [phone] Fax:  [phone]  Date:  01/29/2014   ID:  Edwin Love, DOB 20-Jun-1961, MRN [id number]  PCP:  Love primary provider on file.   History of Present Illness: Edwin Love is a 53 y.o. male here for evaluation of chest pain. Was in emergency department on 11/20/14 with complaint of chest pain and nosebleeds. He was having ongoing cold symptoms at the time. He is limited chest discomfort over the last several days, intermittent pressure same with activity and rest which sometimes is worsened by movement or lifting. He's had chronic shortness of breath with activity for years that is unchanged. These chest pains have been present in the past. Hypertension in the past. Not on any medications. He was told that his heart was enlarged at one point. Chest x-ray here does not show cardiomegaly.  Continues to smoke, Love prior CAD history.  Creatinine 1.41, potassium 4.3, blood alcohol was normal.  EKG on 11/21/14 demonstrated sinus rhythm, LVH, nonspecific ST-T wave changes with generalized ST segment depression.  Has March 5 Adult medicine center visit. He is being treated with depression medications which he brought with him.  Wt Readings from Last 3 Encounters:  01/29/14 176 lb (79.833 kg)  01/21/14 190 lb (86.183 kg)     Past Medical History  Diagnosis Date  . Hypertension   . Back pain     Past Surgical History  Procedure Laterality Date  . Eye surgery Left 1990's    Current Outpatient Prescriptions  Medication Sig Dispense Refill  . citalopram (CELEXA) 20 MG tablet Take 20 mg by mouth daily.      . hydrOXYzine (ATARAX/VISTARIL) 50 MG tablet Take 50 mg by mouth daily at 10 pm.      . ibuprofen (ADVIL,MOTRIN) 200 MG tablet Take 200 mg by mouth every 6 (six) hours as needed.      . paliperidone (INVEGA) 6 MG 24 hr tablet Take 6 mg by mouth daily.       Love current  facility-administered medications for this visit.    Allergies:   Love Known Allergies  Social History:  The patient  reports that he has been smoking.  He does not have any smokeless tobacco history on file. He reports that he drinks alcohol. He reports that he uses illicit drugs (Marijuana).   Family History  Problem Relation Age of Onset  . Diabetes Brother   . Hypertension      family   mother had diabetes, sister with sickle cell anemia.  ROS:  Please see the history of present illness.   Denies any fevers, chills, orthopnea, PND, rash, syncope. Recent upper respiratory virus. Positive for chest pain.   All other systems reviewed and negative.   PHYSICAL EXAM: VS:  BP 162/110  Pulse 112  Ht 5\' 9"  (1.753 m)  Wt 176 lb (79.833 kg)  BMI 25.98 kg/m2 Well nourished, well developed, in Love acute distressWearing sunglasses. HEENT: normal, [place]/AT, EOMI Neck: Love JVD, normal carotid upstroke, Love bruit Cardiac:  normal S1, S2; tachycardic regular; Love murmurNo significant JVD Lungs:  clear to auscultation bilaterally, Love wheezing, rhonchi or rales Abd: soft, nontender, Love hepatomegaly, Love bruitsMildly protuberant Ext: Love edema, 2+ distal pulses Skin: warm and dry GU: deferred Neuro: Love focal abnormalities noted, AAO x 3  EKG:  11/21/14 demonstrated sinus rhythm, LVH, nonspecific ST-T wave changes with generalized ST segment depression.  Chest x-ray 01/21/14-normal, Love evidence of cardiomegaly. Personally viewed.  Labs: 01/21/14- BNP 350, creatinine 1.41, potassium 4.3, troponin normal, hemoglobin 10.3  ASSESSMENT AND PLAN:  1. Chest pain-intermittent, chronic. Abnormal EKG with nonspecific ST changes. Could be angina. He does have some exertional components to it. I will check a pharmacologic nuclear stress test. 2. Dyspnea/tachycardia-we will go ahead and check an echocardiogram. He was told previously that he had cardiomegaly. Perhaps this was from his LVH on EKG. With his  uncontrolled blood pressure, tachycardia, certainly he could have a cardiomyopathy. Because of this, I am starting him on carvedilol 12.5 mg twice a day as well as lisinopril 10 mg once a day. His blood pressure is 162/110 and he should be able to tolerate this dosing. BNP is slightly elevated. 3. Chronic kidney disease stage III-creatinine 1.41 at baseline. We will recheck in 2 weeks after initiation of ACE inhibitor. He also has an appointment with adult internal medicine, primary care on March 5. I strongly encouraged him to continue with this appointment. 4. Tachycardia-as above. 5. Tobacco use-tobacco cessation counseling. 6. He states that he would like records for his disability lawyer. This is fine. 7. Two-week followup with basic metabolic profile.  Signed, Candee Furbish, MD [facility]  01/29/2014 9:48 AM

## 2014-02-07 DIAGNOSIS — I517 Cardiomegaly: Secondary | ICD-10-CM

## 2014-02-07 HISTORY — DX: Cardiomegaly: I51.7

## 2014-02-08 ENCOUNTER — Encounter (HOSPITAL_COMMUNITY): Admission: EM | Disposition: A | Discharge status: Home / Self Care | Payer: Self-pay | Source: Home / Self Care

## 2014-02-08 ENCOUNTER — Encounter (HOSPITAL_COMMUNITY): Payer: Self-pay | Admitting: Emergency Medicine

## 2014-02-08 ENCOUNTER — Observation Stay (HOSPITAL_COMMUNITY): Payer: Medicaid Other | Admitting: Anesthesiology

## 2014-02-08 ENCOUNTER — Emergency Department (HOSPITAL_COMMUNITY): Payer: Medicaid Other

## 2014-02-08 ENCOUNTER — Encounter (HOSPITAL_COMMUNITY): Payer: Medicaid Other | Admitting: Anesthesiology

## 2014-02-08 ENCOUNTER — Inpatient Hospital Stay (HOSPITAL_COMMUNITY)
Admission: EM | Admit: 2014-02-08 | Discharge: 2014-02-11 | DRG: 352 | Disposition: A | Discharge status: Home / Self Care | Payer: Medicaid Other | Attending: General Surgery | Admitting: General Surgery

## 2014-02-08 DIAGNOSIS — G8929 Other chronic pain: Secondary | ICD-10-CM | POA: Diagnosis present

## 2014-02-08 DIAGNOSIS — D696 Thrombocytopenia, unspecified: Secondary | ICD-10-CM | POA: Diagnosis present

## 2014-02-08 DIAGNOSIS — K4030 Unilateral inguinal hernia, with obstruction, without gangrene, not specified as recurrent: Principal | ICD-10-CM | POA: Diagnosis present

## 2014-02-08 DIAGNOSIS — F17200 Nicotine dependence, unspecified, uncomplicated: Secondary | ICD-10-CM | POA: Diagnosis present

## 2014-02-08 DIAGNOSIS — M549 Dorsalgia, unspecified: Secondary | ICD-10-CM | POA: Diagnosis present

## 2014-02-08 DIAGNOSIS — F319 Bipolar disorder, unspecified: Secondary | ICD-10-CM | POA: Diagnosis present

## 2014-02-08 DIAGNOSIS — M109 Gout, unspecified: Secondary | ICD-10-CM | POA: Diagnosis not present

## 2014-02-08 DIAGNOSIS — N183 Chronic kidney disease, stage 3 unspecified: Secondary | ICD-10-CM | POA: Diagnosis present

## 2014-02-08 DIAGNOSIS — F1210 Cannabis abuse, uncomplicated: Secondary | ICD-10-CM | POA: Diagnosis present

## 2014-02-08 DIAGNOSIS — I129 Hypertensive chronic kidney disease with stage 1 through stage 4 chronic kidney disease, or unspecified chronic kidney disease: Secondary | ICD-10-CM | POA: Diagnosis present

## 2014-02-08 DIAGNOSIS — F1010 Alcohol abuse, uncomplicated: Secondary | ICD-10-CM | POA: Diagnosis present

## 2014-02-08 DIAGNOSIS — D638 Anemia in other chronic diseases classified elsewhere: Secondary | ICD-10-CM | POA: Diagnosis present

## 2014-02-08 DIAGNOSIS — F1410 Cocaine abuse, uncomplicated: Secondary | ICD-10-CM | POA: Diagnosis present

## 2014-02-08 HISTORY — DX: Cardiomegaly: I51.7

## 2014-02-08 HISTORY — PX: INSERTION OF MESH: SHX5868

## 2014-02-08 HISTORY — DX: Disorder of kidney and ureter, unspecified: N28.9

## 2014-02-08 HISTORY — PX: INGUINAL HERNIA REPAIR: SHX194

## 2014-02-08 HISTORY — DX: Anemia, unspecified: D64.9

## 2014-02-08 LAB — CBC WITH DIFFERENTIAL/PLATELET
Basophils Absolute: 0 10*3/uL (ref 0.0–0.1)
Basophils Relative: 0 % (ref 0–1)
EOS PCT: 1 % (ref 0–5)
Eosinophils Absolute: 0.1 10*3/uL (ref 0.0–0.7)
HCT: 28.3 % — ABNORMAL LOW (ref 39.0–52.0)
Hemoglobin: 9.9 g/dL — ABNORMAL LOW (ref 13.0–17.0)
Lymphocytes Relative: 14 % (ref 12–46)
Lymphs Abs: 0.9 10*3/uL (ref 0.7–4.0)
MCH: 26.3 pg (ref 26.0–34.0)
MCHC: 35 g/dL (ref 30.0–36.0)
MCV: 75.3 fL — ABNORMAL LOW (ref 78.0–100.0)
MONOS PCT: 5 % (ref 3–12)
Monocytes Absolute: 0.3 10*3/uL (ref 0.1–1.0)
NEUTROS PCT: 80 % — AB (ref 43–77)
Neutro Abs: 4.9 10*3/uL (ref 1.7–7.7)
Platelets: 120 10*3/uL — ABNORMAL LOW (ref 150–400)
RBC: 3.76 MIL/uL — AB (ref 4.22–5.81)
RDW: 22.6 % — ABNORMAL HIGH (ref 11.5–15.5)
SMEAR REVIEW: DECREASED
WBC: 6.2 10*3/uL (ref 4.0–10.5)

## 2014-02-08 LAB — BASIC METABOLIC PANEL
BUN: 12 mg/dL (ref 6–23)
CO2: 17 mEq/L — ABNORMAL LOW (ref 19–32)
Calcium: 8.5 mg/dL (ref 8.4–10.5)
Chloride: 105 mEq/L (ref 96–112)
Creatinine, Ser: 0.91 mg/dL (ref 0.50–1.35)
Glucose, Bld: 122 mg/dL — ABNORMAL HIGH (ref 70–99)
POTASSIUM: 4.5 meq/L (ref 3.7–5.3)
SODIUM: 137 meq/L (ref 137–147)

## 2014-02-08 LAB — URINALYSIS, ROUTINE W REFLEX MICROSCOPIC
Bilirubin Urine: NEGATIVE
Glucose, UA: NEGATIVE mg/dL
Ketones, ur: NEGATIVE mg/dL
Leukocytes, UA: NEGATIVE
NITRITE: NEGATIVE
PH: 5.5 (ref 5.0–8.0)
Protein, ur: 100 mg/dL — AB
SPECIFIC GRAVITY, URINE: 1.03 (ref 1.005–1.030)
UROBILINOGEN UA: 1 mg/dL (ref 0.0–1.0)

## 2014-02-08 LAB — HEPATIC FUNCTION PANEL
ALBUMIN: 2.1 g/dL — AB (ref 3.5–5.2)
ALT: 12 U/L (ref 0–53)
AST: 31 U/L (ref 0–37)
Alkaline Phosphatase: 118 U/L — ABNORMAL HIGH (ref 39–117)
BILIRUBIN TOTAL: 0.4 mg/dL (ref 0.3–1.2)
Bilirubin, Direct: <0.2 mg/dL (ref 0.0–0.3)
TOTAL PROTEIN: 7.9 g/dL (ref 6.0–8.3)

## 2014-02-08 LAB — I-STAT CG4 LACTIC ACID, ED: Lactic Acid, Venous: 2.31 mmol/L — ABNORMAL HIGH (ref 0.5–2.2)

## 2014-02-08 LAB — URINE MICROSCOPIC-ADD ON

## 2014-02-08 LAB — PROTIME-INR
INR: 1.27 (ref 0.00–1.49)
PROTHROMBIN TIME: 15.6 s — AB (ref 11.6–15.2)

## 2014-02-08 LAB — ETHANOL: Alcohol, Ethyl (B): 146 mg/dL — ABNORMAL HIGH (ref 0–11)

## 2014-02-08 LAB — AMMONIA: AMMONIA: 38 umol/L (ref 11–60)

## 2014-02-08 SURGERY — REPAIR, HERNIA, INGUINAL, INCARCERATED
Anesthesia: General | Site: Groin | Laterality: Right

## 2014-02-08 MED ORDER — SODIUM CHLORIDE 0.9 % IV SOLN
1.0000 g | Freq: Once | INTRAVENOUS | Status: DC
  Filled 2014-02-08: qty 1

## 2014-02-08 MED ORDER — ROCURONIUM BROMIDE 50 MG/5ML IV SOLN
INTRAVENOUS | Status: AC
Start: 2014-02-08 — End: 2014-02-08
  Filled 2014-02-08: qty 1

## 2014-02-08 MED ORDER — KETOROLAC TROMETHAMINE 30 MG/ML IJ SOLN
30.0000 mg | Freq: Once | INTRAMUSCULAR | Status: AC
  Administered 2014-02-08: 30 mg via INTRAVENOUS
  Filled 2014-02-08: qty 1

## 2014-02-08 MED ORDER — LACTATED RINGERS IV SOLN
INTRAVENOUS | Status: DC | PRN
  Administered 2014-02-08 (×2): via INTRAVENOUS

## 2014-02-08 MED ORDER — IOHEXOL 300 MG/ML  SOLN
25.0000 mL | Freq: Once | INTRAMUSCULAR | Status: AC | PRN
  Administered 2014-02-08: 25 mL via ORAL

## 2014-02-08 MED ORDER — BUPIVACAINE-EPINEPHRINE (PF) 0.5% -1:200000 IJ SOLN
INTRAMUSCULAR | Status: AC
  Filled 2014-02-08: qty 10

## 2014-02-08 MED ORDER — FENTANYL CITRATE 0.05 MG/ML IJ SOLN
INTRAMUSCULAR | Status: DC | PRN
  Administered 2014-02-08: 100 ug via INTRAVENOUS

## 2014-02-08 MED ORDER — ONDANSETRON HCL 4 MG/2ML IJ SOLN
INTRAMUSCULAR | Status: AC
  Filled 2014-02-08: qty 2

## 2014-02-08 MED ORDER — LIDOCAINE HCL (CARDIAC) 20 MG/ML IV SOLN
INTRAVENOUS | Status: AC
  Filled 2014-02-08: qty 5

## 2014-02-08 MED ORDER — PROPOFOL 10 MG/ML IV BOLUS
INTRAVENOUS | Status: AC
  Filled 2014-02-08: qty 20

## 2014-02-08 MED ORDER — SUCCINYLCHOLINE CHLORIDE 20 MG/ML IJ SOLN
INTRAMUSCULAR | Status: DC | PRN
  Administered 2014-02-08: 120 mg via INTRAVENOUS

## 2014-02-08 MED ORDER — HYDROMORPHONE HCL PF 1 MG/ML IJ SOLN
1.0000 mg | INTRAMUSCULAR | Status: AC
  Administered 2014-02-08: 1 mg via INTRAVENOUS
  Filled 2014-02-08: qty 1

## 2014-02-08 MED ORDER — 0.9 % SODIUM CHLORIDE (POUR BTL) OPTIME
TOPICAL | Status: DC | PRN
  Administered 2014-02-08: 1000 mL

## 2014-02-08 MED ORDER — MIDAZOLAM HCL 5 MG/5ML IJ SOLN
INTRAMUSCULAR | Status: DC | PRN
  Administered 2014-02-08: 2 mg via INTRAVENOUS

## 2014-02-08 MED ORDER — ROCURONIUM BROMIDE 100 MG/10ML IV SOLN
INTRAVENOUS | Status: DC | PRN
  Administered 2014-02-08: 30 mg via INTRAVENOUS

## 2014-02-08 MED ORDER — ONDANSETRON HCL 4 MG/2ML IJ SOLN
4.0000 mg | Freq: Four times a day (QID) | INTRAMUSCULAR | Status: DC | PRN
  Filled 2014-02-08: qty 2

## 2014-02-08 MED ORDER — OXYCODONE HCL 5 MG PO TABS: 5.0000 mg | ORAL_TABLET | Freq: Once | ORAL | Status: DC | PRN

## 2014-02-08 MED ORDER — MIDAZOLAM HCL 2 MG/2ML IJ SOLN
INTRAMUSCULAR | Status: AC
  Filled 2014-02-08: qty 2

## 2014-02-08 MED ORDER — ONDANSETRON HCL 4 MG/2ML IJ SOLN
4.0000 mg | Freq: Once | INTRAMUSCULAR | Status: AC
  Administered 2014-02-08: 4 mg via INTRAVENOUS
  Filled 2014-02-08: qty 2

## 2014-02-08 MED ORDER — HYDROMORPHONE HCL PF 1 MG/ML IJ SOLN
INTRAMUSCULAR | Status: AC
  Administered 2014-02-08: 0.5 mg via INTRAVENOUS
  Filled 2014-02-08: qty 1

## 2014-02-08 MED ORDER — ONDANSETRON HCL 4 MG/2ML IJ SOLN: 4.0000 mg | Freq: Four times a day (QID) | INTRAMUSCULAR | Status: DC | PRN

## 2014-02-08 MED ORDER — GLYCOPYRROLATE 0.2 MG/ML IJ SOLN
INTRAMUSCULAR | Status: DC | PRN
  Administered 2014-02-08: 0.6 mg via INTRAVENOUS

## 2014-02-08 MED ORDER — PROPOFOL 10 MG/ML IV BOLUS
INTRAVENOUS | Status: DC | PRN
  Administered 2014-02-08: 150 mg via INTRAVENOUS
  Administered 2014-02-08: 50 mg via INTRAVENOUS

## 2014-02-08 MED ORDER — HYDROMORPHONE HCL PF 1 MG/ML IJ SOLN
0.2500 mg | INTRAMUSCULAR | Status: DC | PRN
  Administered 2014-02-08 (×2): 0.5 mg via INTRAVENOUS

## 2014-02-08 MED ORDER — ENOXAPARIN SODIUM 40 MG/0.4ML ~~LOC~~ SOLN
40.0000 mg | SUBCUTANEOUS | Status: DC
  Administered 2014-02-08 – 2014-02-10 (×3): 40 mg via SUBCUTANEOUS
  Filled 2014-02-08 (×4): qty 0.4

## 2014-02-08 MED ORDER — SODIUM CHLORIDE 0.9 % IV SOLN
INTRAVENOUS | Status: DC
Start: 2014-02-08 — End: 2014-02-11
  Administered 2014-02-09 – 2014-02-10 (×3): via INTRAVENOUS

## 2014-02-08 MED ORDER — FENTANYL CITRATE 0.05 MG/ML IJ SOLN
INTRAMUSCULAR | Status: AC
  Filled 2014-02-08: qty 5

## 2014-02-08 MED ORDER — OXYCODONE HCL 5 MG/5ML PO SOLN: 5.0000 mg | Freq: Once | ORAL | Status: DC | PRN

## 2014-02-08 MED ORDER — ARTIFICIAL TEARS OP OINT
TOPICAL_OINTMENT | OPHTHALMIC | Status: DC | PRN
  Administered 2014-02-08: 1 via OPHTHALMIC

## 2014-02-08 MED ORDER — ONDANSETRON HCL 4 MG/2ML IJ SOLN
INTRAMUSCULAR | Status: DC | PRN
  Administered 2014-02-08: 4 mg via INTRAVENOUS

## 2014-02-08 MED ORDER — ARTIFICIAL TEARS OP OINT
TOPICAL_OINTMENT | OPHTHALMIC | Status: AC
  Filled 2014-02-08: qty 3.5

## 2014-02-08 MED ORDER — SODIUM CHLORIDE 0.9 % IV BOLUS (SEPSIS)
1000.0000 mL | Freq: Once | INTRAVENOUS | Status: AC
  Administered 2014-02-08: 1000 mL via INTRAVENOUS

## 2014-02-08 MED ORDER — HYDROMORPHONE HCL PF 1 MG/ML IJ SOLN
0.5000 mg | INTRAMUSCULAR | Status: DC | PRN
  Administered 2014-02-09 – 2014-02-10 (×3): 1 mg via INTRAVENOUS
  Filled 2014-02-08 (×3): qty 1

## 2014-02-08 MED ORDER — BUPIVACAINE-EPINEPHRINE 0.5% -1:200000 IJ SOLN
INTRAMUSCULAR | Status: DC | PRN
  Administered 2014-02-08: 30 mL

## 2014-02-08 MED ORDER — SUCCINYLCHOLINE CHLORIDE 20 MG/ML IJ SOLN
INTRAMUSCULAR | Status: AC
  Filled 2014-02-08: qty 1

## 2014-02-08 MED ORDER — NEOSTIGMINE METHYLSULFATE 1 MG/ML IJ SOLN
INTRAMUSCULAR | Status: DC | PRN
  Administered 2014-02-08: 3 mg via INTRAVENOUS

## 2014-02-08 MED ORDER — LIDOCAINE HCL (CARDIAC) 20 MG/ML IV SOLN
INTRAVENOUS | Status: DC | PRN
  Administered 2014-02-08: 100 mg via INTRAVENOUS

## 2014-02-08 MED ORDER — ERTAPENEM SODIUM 1 G IJ SOLR
1.0000 g | INTRAMUSCULAR | Status: DC | PRN
  Administered 2014-02-08: 1 g via INTRAVENOUS

## 2014-02-08 MED ORDER — IOHEXOL 300 MG/ML  SOLN
100.0000 mL | Freq: Once | INTRAMUSCULAR | Status: AC | PRN
  Administered 2014-02-08: 100 mL via INTRAVENOUS

## 2014-02-08 SURGICAL SUPPLY — 57 items
ADH SKN CLS APL DERMABOND .7 (GAUZE/BANDAGES/DRESSINGS) ×1
BLADE SURG 10 STRL SS (BLADE) ×3 IMPLANT
BLADE SURG 15 STRL LF DISP TIS (BLADE) ×1 IMPLANT
BLADE SURG 15 STRL SS (BLADE) ×3
BLADE SURG ROTATE 9660 (MISCELLANEOUS) ×2 IMPLANT
CHLORAPREP W/TINT 26ML (MISCELLANEOUS) ×3 IMPLANT
COVER SURGICAL LIGHT HANDLE (MISCELLANEOUS) ×3 IMPLANT
DECANTER SPIKE VIAL GLASS SM (MISCELLANEOUS) IMPLANT
DERMABOND ADVANCED (GAUZE/BANDAGES/DRESSINGS) ×2
DERMABOND ADVANCED .7 DNX12 (GAUZE/BANDAGES/DRESSINGS) ×1 IMPLANT
DRAIN PENROSE 1/2X12 LTX STRL (WOUND CARE) ×2 IMPLANT
DRAPE INCISE IOBAN 66X45 STRL (DRAPES) ×3 IMPLANT
DRAPE LAPAROTOMY TRNSV 102X78 (DRAPE) ×3 IMPLANT
DRAPE UTILITY 15X26 W/TAPE STR (DRAPE) ×6 IMPLANT
ELECT CAUTERY BLADE 6.4 (BLADE) ×3 IMPLANT
ELECT REM PT RETURN 9FT ADLT (ELECTROSURGICAL) ×3
ELECTRODE REM PT RTRN 9FT ADLT (ELECTROSURGICAL) ×1 IMPLANT
GLOVE BIO SURGEON STRL SZ7.5 (GLOVE) ×2 IMPLANT
GLOVE BIOGEL PI IND STRL 6.5 (GLOVE) IMPLANT
GLOVE BIOGEL PI IND STRL 7.0 (GLOVE) IMPLANT
GLOVE BIOGEL PI IND STRL 7.5 (GLOVE) IMPLANT
GLOVE BIOGEL PI IND STRL 8 (GLOVE) ×1 IMPLANT
GLOVE BIOGEL PI INDICATOR 6.5 (GLOVE) ×4
GLOVE BIOGEL PI INDICATOR 7.0 (GLOVE) ×4
GLOVE BIOGEL PI INDICATOR 7.5 (GLOVE) ×2
GLOVE BIOGEL PI INDICATOR 8 (GLOVE) ×2
GLOVE ECLIPSE 6.5 STRL STRAW (GLOVE) ×2 IMPLANT
GLOVE SS BIOGEL STRL SZ 7.5 (GLOVE) ×1 IMPLANT
GLOVE SUPERSENSE BIOGEL SZ 7.5 (GLOVE) ×2
GLOVE SURG SS PI 7.0 STRL IVOR (GLOVE) ×2 IMPLANT
GOWN STRL NON-REIN LRG LVL3 (GOWN DISPOSABLE) ×7 IMPLANT
GOWN STRL REIN XL XLG (GOWN DISPOSABLE) ×5 IMPLANT
KIT BASIN OR (CUSTOM PROCEDURE TRAY) ×3 IMPLANT
KIT ROOM TURNOVER OR (KITS) ×3 IMPLANT
MESH PROLENE 3X6 (Mesh General) ×2 IMPLANT
NDL HYPO 25GX1X1/2 BEV (NEEDLE) ×1 IMPLANT
NEEDLE HYPO 25GX1X1/2 BEV (NEEDLE) ×3 IMPLANT
NS IRRIG 1000ML POUR BTL (IV SOLUTION) ×3 IMPLANT
PACK SURGICAL SETUP 50X90 (CUSTOM PROCEDURE TRAY) ×3 IMPLANT
PAD ARMBOARD 7.5X6 YLW CONV (MISCELLANEOUS) ×3 IMPLANT
PENCIL BUTTON HOLSTER BLD 10FT (ELECTRODE) ×3 IMPLANT
SPECIMEN JAR SMALL (MISCELLANEOUS) IMPLANT
SPONGE LAP 4X18 X RAY DECT (DISPOSABLE) ×3 IMPLANT
SUT MON AB 4-0 PC3 18 (SUTURE) ×3 IMPLANT
SUT PROLENE 0 CT 1 CR/8 (SUTURE) ×2 IMPLANT
SUT PROLENE 2 0 CT2 30 (SUTURE) ×6 IMPLANT
SUT SILK 2 0 SH (SUTURE) IMPLANT
SUT SILK 3 0 (SUTURE)
SUT SILK 3-0 18XBRD TIE 12 (SUTURE) IMPLANT
SUT VIC AB 3-0 54X BRD REEL (SUTURE) ×1 IMPLANT
SUT VIC AB 3-0 BRD 54 (SUTURE) ×3
SUT VIC AB 3-0 CT1 27 (SUTURE) ×3
SUT VIC AB 3-0 CT1 TAPERPNT 27 (SUTURE) ×1 IMPLANT
SYR CONTROL 10ML LL (SYRINGE) ×3 IMPLANT
TOWEL OR 17X24 6PK STRL BLUE (TOWEL DISPOSABLE) ×3 IMPLANT
TOWEL OR 17X26 10 PK STRL BLUE (TOWEL DISPOSABLE) ×3 IMPLANT
TRAY FOLEY CATH 14FRSI W/METER (CATHETERS) ×2 IMPLANT

## 2014-02-08 NOTE — ED Notes (Signed)
Surgical PA at bedside  

## 2014-02-08 NOTE — Anesthesia Procedure Notes (Signed)
Procedure Name: Intubation Date/Time: 02/08/2014 1:05 PM Performed by: Scheryl Darter Pre-anesthesia Checklist: Patient identified, Emergency Drugs available, Suction available, Patient being monitored and Timeout performed Patient Re-evaluated:Patient Re-evaluated prior to inductionOxygen Delivery Method: Circle system utilized Preoxygenation: Pre-oxygenation with 100% oxygen Intubation Type: IV induction and Rapid sequence Ventilation: Mask ventilation without difficulty Laryngoscope Size: Miller and 3 Grade View: Grade I Tube type: Oral Tube size: 7.5 mm Number of attempts: 1 Airway Equipment and Method: Stylet Placement Confirmation: ETT inserted through vocal cords under direct vision Secured at: 22 cm Tube secured with: Tape Comments: ALL teeth loose as per pt disclosure during pre-op interview. Pt aware one or more teeth may come out before/after/during intubation/extubation process. Pt aware and agrees  to proceed.

## 2014-02-08 NOTE — ED Notes (Signed)
Patient transported to CT 

## 2014-02-08 NOTE — ED Notes (Signed)
Pt informed of to drink contrast to prevent delay

## 2014-02-08 NOTE — Anesthesia Preprocedure Evaluation (Addendum)
Anesthesia Evaluation  Patient identified by MRN, date of birth, ID band Patient awake    Reviewed: Allergy & Precautions, H&P , NPO status , Patient's Chart, lab work & pertinent test results  Airway Mallampati: II TM Distance: >3 FB Neck ROM: full    Dental  (+) Poor Dentition, Loose, Dental Advisory Given   Pulmonary Current Smoker,    Pulmonary exam normal       Cardiovascular hypertension,     Neuro/Psych Depression negative neurological ROS     GI/Hepatic negative GI ROS, Neg liver ROS,   Endo/Other  negative endocrine ROS  Renal/GU Renal InsufficiencyRenal disease     Musculoskeletal   Abdominal   Peds  Hematology   Anesthesia Other Findings   Reproductive/Obstetrics                         Anesthesia Physical Anesthesia Plan  ASA: II  Anesthesia Plan: General   Post-op Pain Management:    Induction: Intravenous  Airway Management Planned: Oral ETT  Additional Equipment:   Intra-op Plan:   Post-operative Plan: Extubation in OR  Informed Consent: I have reviewed the patients History and Physical, chart, labs and discussed the procedure including the risks, benefits and alternatives for the proposed anesthesia with the patient or authorized representative who has indicated his/her understanding and acceptance.   Dental advisory given  Plan Discussed with: CRNA, Anesthesiologist and Surgeon  Anesthesia Plan Comments:        Anesthesia Quick Evaluation

## 2014-02-08 NOTE — H&P (Signed)
Edwin Love [birth year]  [id number].    Requesting MD: Dr. Lita Mains Chief Complaint/Reason for Consult: Inguinal hernia HPI:  Edwin Love is a 53 y.o. male with past medical history of HTN, Stage III Kidney disease, LVH, and chronic back pain who arrived with EMS from home reporting right testicular pain/swelling that started at 0200 on 02/08/14. Patient states he has had recurrent pain and swelling to the same area in the past.  He was referred to a urologist but did not follow up due to cost.  He rates the pain 8/10 and states that it does not radiate anywhere.  He denies any aggravating/relieving factors. He states he sought medical care for one previous episode, but has had at least two similar episodes where he did not seek care and the pain and swelling resolved on its own.  Pt denies hernia to any other area and denies any abdominal surgeries (hernia repair or otherwise).  Pt reports drinking a "40oz" a day and admits to sometimes drinking more.  Denies any recent hx of drug use, but states he has used drugs in the past.    Pt denies injury, fever, chills, chest pain, SOB, dysuria, pain with bowel movements, blood in his stool, and N/V.  No h/o other hernias or any abdominal surgeries.  Pt states he may have lost weight recently, but doesn't keep track of it.   Pt states he is scheduled for a cardiac workup with Dr. Marlou Porch later this month after going to the hospital with chest pain.  The patient was found to have both clinically and radiographically a right incarcerated inguinal hernia, with elevated lactic acid   ROS: All systems reviewed and otherwise negative except for as above  Family History  Problem Relation Age of Onset  . Diabetes Brother   . Hypertension      family    Past Medical History  Diagnosis Date  . Hypertension   . Back pain   . Renal disorder   . Anemia   . LVH (left ventricular hypertrophy)     Past Surgical History  Procedure  Laterality Date  . Eye surgery Left 1990's    Foreign body    Social History:  reports that he has been smoking Cigarettes.  He has a 17 pack-year smoking history. He does not have any smokeless tobacco history on file. He reports that he drinks alcohol. He reports that he uses illicit drugs (Marijuana and Cocaine).  Allergies: No Known Allergies   (Not in a hospital admission)  Blood pressure 148/88, pulse 82, temperature 98 F (36.7 C), temperature source Oral, resp. rate 16, SpO2 98.00%.  Physical Exam: General: pleasant, WD/WN black male who is laying in bed in NAD HEENT: head is normocephalic, atraumatic. Left eye resected from a previous trauma. Sclera of Right eye noninjected, with mild scleral icterus.  Pupil approx 2 mm.  Ears and nose without any masses or lesions.  Mouth is pink and moist. Heart: regular, rate, and rhythm.  No obvious murmurs, gallops, or rubs noted. Lungs: CTAB, no wheezes, rhonchi, or rales noted.  Respiratory effort nonlabored Abd: soft, non tender, distended. +BS, large amount of ascites. Fluid wave present. Non-tender umbilical hernia with defect approx 7 mm in diameter.  MS: all 4 extremities are symmetrical with no cyanosis, clubbing, or edema. 2+ DP pulses. GU: Obvious swelling to right side of scrotum, with a large, hard non-reducible mass.  Severe tenderness. Left testicle normal, non-tender.   Results for orders placed  during the hospital encounter of 02/08/14 (from the past 48 hour(s))  CBC WITH DIFFERENTIAL     Status: Abnormal   Collection Time    02/08/14  7:19 AM      Result Value Ref Range   WBC 6.2  4.0 - 10.5 K/uL   RBC 3.76 (*) 4.22 - 5.81 MIL/uL   Hemoglobin 9.9 (*) 13.0 - 17.0 g/dL   HCT 28.3 (*) 39.0 - 52.0 %   MCV 75.3 (*) 78.0 - 100.0 fL   MCH 26.3  26.0 - 34.0 pg   MCHC 35.0  30.0 - 36.0 g/dL   RDW 22.6 (*) 11.5 - 15.5 %   Platelets 120 (*) 150 - 400 K/uL   Neutrophils Relative % 80 (*) 43 - 77 %   Lymphocytes Relative 14   12 - 46 %   Monocytes Relative 5  3 - 12 %   Eosinophils Relative 1  0 - 5 %   Basophils Relative 0  0 - 1 %   Neutro Abs 4.9  1.7 - 7.7 K/uL   Lymphs Abs 0.9  0.7 - 4.0 K/uL   Monocytes Absolute 0.3  0.1 - 1.0 K/uL   Eosinophils Absolute 0.1  0.0 - 0.7 K/uL   Basophils Absolute 0.0  0.0 - 0.1 K/uL   RBC Morphology TARGET CELLS     Comment: POLYCHROMASIA PRESENT   WBC Morphology TOXIC GRANULATION     Smear Review PLATELETS APPEAR DECREASED    BASIC METABOLIC PANEL     Status: Abnormal   Collection Time    02/08/14  7:19 AM      Result Value Ref Range   Sodium 137  137 - 147 mEq/L   Potassium 4.5  3.7 - 5.3 mEq/L   Comment: HEMOLYSIS AT THIS LEVEL MAY AFFECT RESULT   Chloride 105  96 - 112 mEq/L   CO2 17 (*) 19 - 32 mEq/L   Glucose, Bld 122 (*) 70 - 99 mg/dL   BUN 12  6 - 23 mg/dL   Creatinine, Ser 0.91  0.50 - 1.35 mg/dL   Calcium 8.5  8.4 - 10.5 mg/dL   GFR calc non Af Amer >90  >90 mL/min   GFR calc Af Amer >90  >90 mL/min   Comment: (NOTE)     The eGFR has been calculated using the CKD EPI equation.     This calculation has not been validated in all clinical situations.     eGFR's persistently <90 mL/min signify possible Chronic Kidney     Disease.   Ct Abdomen Pelvis W Contrast  02/08/2014   CLINICAL DATA:  Right testicular pain and swelling onset this morning, with history of recurring pain to this area  EXAM: CT ABDOMEN AND PELVIS WITH CONTRAST  TECHNIQUE: Multidetector CT imaging of the abdomen and pelvis was performed using the standard protocol following bolus administration of intravenous contrast.  CONTRAST:  150mL OMNIPAQUE IOHEXOL 300 MG/ML  SOLN  COMPARISON:  US SCROTUM dated 04/10/2004; [place] ART/VEN ABD/PELV/SCROTUM DOPPLER dated 04/10/2004  FINDINGS: Visualized lung bases clear.  Small volume of ascites seen around the liver, and into the gallbladder fossa as was trace ascites in the right lower quadrant. No focal hepatic abnormalities. Gallbladder is mildly distended with  mild wall thickening. No gallstones identified. Pancreas and spleen are normal. Adrenal glands show no significant findings, with an 8 mm nodule left adrenal apex likely benign. Kidneys are normal. Now calcification of the aorta noted. Appendix is normal.  Bladder is normal. Distal ileum herniates into the right inguinal canal and into the right scrotum were there is a hydrocele. There is mild bowel wall thickening involving herniated bowel with mild inflammatory change in the inguinal canal. Immediately proximal loop of small bowel is mildly dilated to a diameter of 3.4 cm. Terminal ileum is decompressed. There is moderate wall thickening of the cecum and proximal ascending colon with mild surrounding inflammatory change.  There are no acute osseous abnormalities.  IMPRESSION: 1. Right inguinal hernia containing distal small bowel with findings to suggest possibility of strangulation and developing obstruction. 2. Evidence of wall thickening and inflammatory change involving proximal colon. Uncertain of this is related to the small bowel containing inguinal hernia, with considerations including secondary inflammation by adjacent spread versus vascular compromise. 3. Small volume ascites in the abdomen. Gallbladder is distended to about 11 cm with mild wall thickening. Uncertain if this is related to the inflammatory process in the pelvis or was of this represents a separate concurrent inflammatory process involving the gallbladder. Critical Value/emergent results were called by telephone at the time of interpretation on 02/08/2014 at 9:56 AM to the physician treating the patient in the emergency department, who verbally acknowledged these results.   Electronically Signed   By: Skipper Cliche M.D.   On: 02/08/2014 09:58      Assessment/Plan R incarcerated Inguinal hernia Stage III Renal Disease Thrombocytopenia Anemia of chronic disease? Hypertension - not currently taking meds due to cost Chronic back  pain - applying for disability LVH Alcohol Use Tobacco Use Depression - on celexa  Plan: 1.  Admit to Surgery for emergent reduction of inguinal hernia and repair, possible bowel resection, possible mesh placement. 2.  NPO, IVF, pain control, antiemetics, antibiotics preop 1g Invanz 3.  Pending LFT's 4.  May need medicine and/or cardiology post-op   Barrington Ellison, PA-S [facility] 02/08/2014, 11:09 AM Pager: [phone]

## 2014-02-08 NOTE — Preoperative (Signed)
Beta Blockers   Reason not to administer Beta Blockers:Not Applicable 

## 2014-02-08 NOTE — Transfer of Care (Signed)
Immediate Anesthesia Transfer of Care Note  Patient: Edwin Love  Procedure(s) Performed: Procedure(s): HERNIA REPAIR INGUINAL INCARCERATED (Right) INSERTION OF MESH (Right)  Patient Location: PACU  Anesthesia Type:General  Level of Consciousness: awake, sedated and patient cooperative  Airway & Oxygen Therapy: Patient Spontanous Breathing and Patient connected to face mask oxygen  Post-op Assessment: Report given to PACU RN, Post -op Vital signs reviewed and stable and Patient moving all extremities  Post vital signs: Reviewed and stable  Complications: No apparent anesthesia complications

## 2014-02-08 NOTE — Op Note (Signed)
Preoperative Diagnosis: Incarcerated right inguinal hernia [550.10]  Postoprative Diagnosis: Same  Procedure: Procedure(s): HERNIA REPAIR INGUINAL INCARCERATED INSERTION OF MESH   Surgeon: Excell Seltzer T   Assistants: Coralie Keens P.A.  Anesthesia:  General endotracheal anesthesia  Indications: patient is a 53 year old male who presents with an approximately 8 hour history of pain and swelling in his right groin. On examination and confirmed by CT scan he has an incarcerated right inguinal hernia containing bowel. We have recommended the emergency repair under general anesthesia. I discussed the indications for the surgery and risks the patient and his father detailed elsewhere and he agrees to proceed.  Procedure Detail:  Patient was brought to the operating room, placed in the supine position on the operating table, and general endotracheal anesthesia induced. Orogastric tube was placed. Foley catheter was placed. The abdomen and groins, genitalia and upper thighs were widely sterilely prepped and draped. Patient timeout was performed and the procedure verified. He had received broad-spectrum IV antibiotics. An oblique incision was made in the right groin and dissection carried down through the subcutaneous tissue and Scarpa's fascia. The external oblique was identified and cleared laterally to the inguinal ligament and down to the external ring. The external oblique was then divided along the lines of its fibers through the external ring. A large hernia sac and the cord were bluntly dissected up off the floor of the inguinal canal at this point the contents of the hernia reduced into the abdominal cavity. There was a large very thick hernia sac. Using extensive blunt and cautery dissection I was able to identify the cord structures and dissected these completely off of it away from the hernia sac and the hernia sac was isolated from cord structures down toward the testicle. At this point I  divided the hernia sac with cautery just above the testicle. It contained clear serous fluid. I was able to examine the several loops of bowel through the fairly tight internal ring and one appeared somewhat edematous, likely having been within the hernia sac, but no evidence of damage or ischemia to the bowel. Following this the hernia sac was completely dissected away from cord structures up to and above the level of the internal ring and at this point the sac was suture ligated with 0 Prolene and divided and removed and the stump retracted nicely above the internal ring. The inguinal ligament and floor of the canal were cleared for repair. A piece of Prolene mesh was trimmed to size to fit the floor of the inguinal canal with tails around the cord at the internal ring. It was sutured initially to the pubic tubercle and then to the inguinal ligament working medial to lateral until the suture was well lateral to the internal ring. Medially the mesh was sutured to the edge of the rectus sheath. The tails were then tacked together lateral to the cord creating a new snug internal ring. Following this the cord and ilioinguinal nerves were returned to their anatomic position the external oblique was closed over this with running 3-0 Vicryl. Subcutaneous was closed with running 3-0 Vicryl and skin with subcuticular 4-0 Monocryl and Dermabond. Sponge needle and instrument counts were correct.    Findings: Incarcerated inguinal hernia containing bowel which did not appear ischemic  Estimated Blood Loss:  Minimal         Drains:  none  Blood Given: none          Specimens:  none  Complications:  * No complications entered in OR log *         Disposition: PACU - hemodynamically stable.         Condition: stable

## 2014-02-08 NOTE — Discharge Planning (Signed)
J0RP Felicia E, Community Liaison  Spoke to patient about primary care resources, resource guide and my contact information given for any future questions or concerns.

## 2014-02-08 NOTE — ED Provider Notes (Signed)
CSN: [id number]     Arrival date & time 02/08/14  7062 History   First MD Initiated Contact with Patient 02/08/14 [phone]     Chief Complaint  Patient presents with  . Testicle Pain     (Consider location/radiation/quality/duration/timing/severity/associated sxs/prior Treatment) The history is provided by the patient.   Patient presents with a "knot" above his right testicle.  States he has problems with his testicles before and has been to the urologist.  States this comes and goes but this time the "knot" is above his right testicle and is painful, 8/10 intensity, no radiation, exacerbated with palpation, no palliative factors.  Denies fevers, abdominal pain, vomiting, penile discharge, dysuria, change in bowel habits.    Interview somewhat limited due to apparent intoxication.    Past Medical History  Diagnosis Date  . Hypertension   . Back pain   . Renal disorder   . Anemia   . LVH (left ventricular hypertrophy)    Past Surgical History  Procedure Laterality Date  . Eye surgery Left 91's   Family History  Problem Relation Age of Onset  . Diabetes Brother   . Hypertension      family   History  Substance Use Topics  . Smoking status: Current Every Day Smoker  . Smokeless tobacco: Not on file  . Alcohol Use: Yes     Comment: mostly on weekends    Review of Systems  Constitutional: Negative for fever.  Respiratory: Negative for cough and shortness of breath.   Cardiovascular: Negative for chest pain.  Gastrointestinal: Negative for nausea, vomiting, abdominal pain and diarrhea.  Genitourinary: Positive for scrotal swelling. Negative for dysuria, urgency, frequency, discharge and penile swelling.  All other systems reviewed and are negative.      Allergies  Review of patient's allergies indicates no known allergies.  Home Medications   Current Outpatient Rx  Name  Route  Sig  Dispense  Refill  . carvedilol (COREG) 12.5 MG tablet   Oral   Take 1 tablet  (12.5 mg total) by mouth 2 (two) times daily.   60 tablet   3   . citalopram (CELEXA) 20 MG tablet   Oral   Take 20 mg by mouth daily.         . hydrOXYzine (ATARAX/VISTARIL) 50 MG tablet   Oral   Take 50 mg by mouth daily at 10 pm.         . ibuprofen (ADVIL,MOTRIN) 200 MG tablet   Oral   Take 200 mg by mouth every 6 (six) hours as needed.         Marland Kitchen lisinopril (PRINIVIL,ZESTRIL) 10 MG tablet   Oral   Take 1 tablet (10 mg total) by mouth daily.   30 tablet   3   . paliperidone (INVEGA) 6 MG 24 hr tablet   Oral   Take 6 mg by mouth daily.          BP 103/67  Pulse 77  Temp(Src) 98 F (36.7 C) (Oral)  Resp 14  SpO2 100% Physical Exam  Nursing note and vitals reviewed. Constitutional: He appears well-developed and well-nourished. No distress.  Appears intoxicated  HENT:  Head: Normocephalic and atraumatic.  Neck: Neck supple.  Cardiovascular: Normal rate and regular rhythm.   Pulmonary/Chest: Effort normal and breath sounds normal. No respiratory distress. He has no wheezes. He has no rales.  Abdominal: Soft. He exhibits no distension and no mass. There is no tenderness. There is no rebound and  no guarding. A hernia is present. Hernia confirmed positive in the right inguinal area.  Genitourinary:     Neurological: He is alert. He exhibits normal muscle tone.  Skin: He is not diaphoretic.    ED Course  Procedures (including critical care time) Labs Review Labs Reviewed  CBC WITH DIFFERENTIAL - Abnormal; Notable for the following:    RBC 3.76 (*)    Hemoglobin 9.9 (*)    HCT 28.3 (*)    MCV 75.3 (*)    RDW 22.6 (*)    Platelets 120 (*)    Neutrophils Relative % 80 (*)    All other components within normal limits  BASIC METABOLIC PANEL - Abnormal; Notable for the following:    CO2 17 (*)    Glucose, Bld 122 (*)    All other components within normal limits  HEPATIC FUNCTION PANEL - Abnormal; Notable for the following:    Albumin 2.1 (*)     Alkaline Phosphatase 118 (*)    All other components within normal limits  ETHANOL - Abnormal; Notable for the following:    Alcohol, Ethyl (B) 146 (*)    All other components within normal limits  PROTIME-INR - Abnormal; Notable for the following:    Prothrombin Time 15.6 (*)    All other components within normal limits  I-STAT CG4 LACTIC ACID, ED - Abnormal; Notable for the following:    Lactic Acid, Venous 2.31 (*)    All other components within normal limits  AMMONIA  URINALYSIS, ROUTINE W REFLEX MICROSCOPIC   Imaging Review Ct Abdomen Pelvis W Contrast  02/08/2014   CLINICAL DATA:  Right testicular pain and swelling onset this morning, with history of recurring pain to this area  EXAM: CT ABDOMEN AND PELVIS WITH CONTRAST  TECHNIQUE: Multidetector CT imaging of the abdomen and pelvis was performed using the standard protocol following bolus administration of intravenous contrast.  CONTRAST:  190mL OMNIPAQUE IOHEXOL 300 MG/ML  SOLN  COMPARISON:  US SCROTUM dated 04/10/2004; [place] ART/VEN ABD/PELV/SCROTUM DOPPLER dated 04/10/2004  FINDINGS: Visualized lung bases clear.  Small volume of ascites seen around the liver, and into the gallbladder fossa as was trace ascites in the right lower quadrant. No focal hepatic abnormalities. Gallbladder is mildly distended with mild wall thickening. No gallstones identified. Pancreas and spleen are normal. Adrenal glands show no significant findings, with an 8 mm nodule left adrenal apex likely benign. Kidneys are normal. Now calcification of the aorta noted. Appendix is normal.  Bladder is normal. Distal ileum herniates into the right inguinal canal and into the right scrotum were there is a hydrocele. There is mild bowel wall thickening involving herniated bowel with mild inflammatory change in the inguinal canal. Immediately proximal loop of small bowel is mildly dilated to a diameter of 3.4 cm. Terminal ileum is decompressed. There is moderate wall thickening of the  cecum and proximal ascending colon with mild surrounding inflammatory change.  There are no acute osseous abnormalities.  IMPRESSION: 1. Right inguinal hernia containing distal small bowel with findings to suggest possibility of strangulation and developing obstruction. 2. Evidence of wall thickening and inflammatory change involving proximal colon. Uncertain of this is related to the small bowel containing inguinal hernia, with considerations including secondary inflammation by adjacent spread versus vascular compromise. 3. Small volume ascites in the abdomen. Gallbladder is distended to about 11 cm with mild wall thickening. Uncertain if this is related to the inflammatory process in the pelvis or was of this represents a separate  concurrent inflammatory process involving the gallbladder. Critical Value/emergent results were called by telephone at the time of interpretation on 02/08/2014 at 9:56 AM to the physician treating the patient in the emergency department, who verbally acknowledged these results.   Electronically Signed   By: Skipper Cliche M.D.   On: 02/08/2014 09:58     EKG Interpretation None     6:47 AM Suspect incarcerated right inguinal hernia. Hernia not currently reducible.  I have laid the bed flat and have ordered pain medication, will reattempt.  No outward signs of strangulation.  CT abd/pelvis ordered.   7:41 AM Unable to reduce hernia after pain medication.   MDM   Final diagnoses:  Strangulated inguinal hernia    Patient with c/o right testicular pain, found to have right inguinal hernia on exam that I could not reduce.  Concern for incarceration.  CT scan shows possible strangulation and developing obstruction.  Also noted gallbladder distension and ascites.  He does not have abdominal tenderness.  Pt admitted to [facility].  Taken from ED to the OR.        [place], PA-C 02/08/14 1619

## 2014-02-08 NOTE — H&P (Signed)
Patient interviewed and examined, agree with PA note above. He has an incarcerated RIH and will require emergency repair. Discussed procedure with patient and father including indication, risks of anesthesia, cardiorespiratory failure, bleeding, infection, recurrence and possible need for bowel resection.  They understand and agree to proceed Edward Jolly MD, FACS  02/08/2014 12:53 PM

## 2014-02-08 NOTE — Anesthesia Postprocedure Evaluation (Signed)
Anesthesia Post Note  Patient: Edwin Love  Procedure(s) Performed: Procedure(s) (LRB): HERNIA REPAIR INGUINAL INCARCERATED (Right) INSERTION OF MESH (Right)  Anesthesia type: general  Patient location: PACU  Post pain: Pain level controlled  Post assessment: Patient's Cardiovascular Status Stable  Last Vitals:  Filed Vitals:   02/08/14 1545  BP: 154/93  Pulse: 89  Temp:   Resp: 15    Post vital signs: Reviewed and stable  Level of consciousness: sedated  Complications: No apparent anesthesia complications

## 2014-02-08 NOTE — ED Notes (Addendum)
Pt. arrived with EMS from home reports right testicular pain/swelling onset this morning , denies injury / no urinary discomfort , no fever or chills. Pt. stated recurring pain in the past - referred to urologist but did not return for follow up visit .

## 2014-02-09 LAB — HEPATIC FUNCTION PANEL
ALT: 9 U/L (ref 0–53)
AST: 28 U/L (ref 0–37)
Albumin: 1.7 g/dL — ABNORMAL LOW (ref 3.5–5.2)
Alkaline Phosphatase: 101 U/L (ref 39–117)
Bilirubin, Direct: 0.3 mg/dL (ref 0.0–0.3)
Indirect Bilirubin: 0.4 mg/dL (ref 0.3–0.9)
TOTAL PROTEIN: 6.8 g/dL (ref 6.0–8.3)
Total Bilirubin: 0.7 mg/dL (ref 0.3–1.2)

## 2014-02-09 LAB — BASIC METABOLIC PANEL
BUN: 12 mg/dL (ref 6–23)
CHLORIDE: 110 meq/L (ref 96–112)
CO2: 21 meq/L (ref 19–32)
Calcium: 7.9 mg/dL — ABNORMAL LOW (ref 8.4–10.5)
Creatinine, Ser: 1.01 mg/dL (ref 0.50–1.35)
GFR calc non Af Amer: 83 mL/min — ABNORMAL LOW (ref >90)
Glucose, Bld: 88 mg/dL (ref 70–99)
Potassium: 3.7 mEq/L (ref 3.7–5.3)
Sodium: 143 mEq/L (ref 137–147)

## 2014-02-09 LAB — CBC
HCT: 35.1 % — ABNORMAL LOW (ref 39.0–52.0)
Hemoglobin: 11.9 g/dL — ABNORMAL LOW (ref 13.0–17.0)
MCH: 26 pg (ref 26.0–34.0)
MCHC: 33.9 g/dL (ref 30.0–36.0)
MCV: 76.8 fL — AB (ref 78.0–100.0)
PLATELETS: 65 10*3/uL — AB (ref 150–400)
RBC: 4.57 MIL/uL (ref 4.22–5.81)
RDW: 22.8 % — ABNORMAL HIGH (ref 11.5–15.5)
WBC: 5.4 10*3/uL (ref 4.0–10.5)

## 2014-02-09 MED ORDER — VITAMIN B-1 100 MG PO TABS
100.0000 mg | ORAL_TABLET | Freq: Every day | ORAL | Status: DC
Start: 2014-02-09 — End: 2014-02-11
  Administered 2014-02-09 – 2014-02-11 (×3): 100 mg via ORAL
  Filled 2014-02-09 (×3): qty 1

## 2014-02-09 MED ORDER — LISINOPRIL 10 MG PO TABS
10.0000 mg | ORAL_TABLET | Freq: Every day | ORAL | Status: DC
  Administered 2014-02-09 – 2014-02-11 (×3): 10 mg via ORAL
  Filled 2014-02-09 (×3): qty 1

## 2014-02-09 MED ORDER — LORAZEPAM 2 MG/ML IJ SOLN: 1.0000 mg | Freq: Four times a day (QID) | INTRAMUSCULAR | Status: DC | PRN

## 2014-02-09 MED ORDER — THIAMINE HCL 100 MG/ML IJ SOLN
100.0000 mg | Freq: Every day | INTRAMUSCULAR | Status: DC
  Filled 2014-02-09: qty 1

## 2014-02-09 MED ORDER — ACETAMINOPHEN 160 MG/5ML PO SOLN: 650.0000 mg | ORAL | Status: DC | PRN

## 2014-02-09 MED ORDER — ADULT MULTIVITAMIN W/MINERALS CH
1.0000 | ORAL_TABLET | Freq: Every day | ORAL | Status: DC
  Administered 2014-02-09 – 2014-02-11 (×3): 1 via ORAL
  Filled 2014-02-09 (×3): qty 1

## 2014-02-09 MED ORDER — PALIPERIDONE ER 6 MG PO TB24
6.0000 mg | ORAL_TABLET | Freq: Every day | ORAL | Status: DC
  Administered 2014-02-09 – 2014-02-11 (×3): 6 mg via ORAL
  Filled 2014-02-09 (×3): qty 1

## 2014-02-09 MED ORDER — CARVEDILOL 12.5 MG PO TABS
12.5000 mg | ORAL_TABLET | Freq: Two times a day (BID) | ORAL | Status: DC
  Administered 2014-02-09 – 2014-02-11 (×5): 12.5 mg via ORAL
  Filled 2014-02-09 (×6): qty 1

## 2014-02-09 MED ORDER — LORAZEPAM 1 MG PO TABS: 1.0000 mg | ORAL_TABLET | Freq: Four times a day (QID) | ORAL | Status: DC | PRN

## 2014-02-09 MED ORDER — OXYCODONE HCL 5 MG/5ML PO SOLN
5.0000 mg | ORAL | Status: DC | PRN
  Administered 2014-02-09 (×2): 10 mg via ORAL
  Administered 2014-02-10: 5 mg via ORAL
  Filled 2014-02-09 (×3): qty 10

## 2014-02-09 MED ORDER — CITALOPRAM HYDROBROMIDE 20 MG PO TABS
20.0000 mg | ORAL_TABLET | Freq: Every day | ORAL | Status: DC
  Administered 2014-02-09 – 2014-02-11 (×3): 20 mg via ORAL
  Filled 2014-02-09 (×3): qty 1

## 2014-02-09 MED ORDER — HYDROXYZINE HCL 50 MG PO TABS
50.0000 mg | ORAL_TABLET | Freq: Every day | ORAL | Status: DC
  Administered 2014-02-09 – 2014-02-10 (×2): 50 mg via ORAL
  Filled 2014-02-09 (×3): qty 1

## 2014-02-09 MED ORDER — FOLIC ACID 1 MG PO TABS
1.0000 mg | ORAL_TABLET | Freq: Every day | ORAL | Status: DC
  Administered 2014-02-09 – 2014-02-11 (×3): 1 mg via ORAL
  Filled 2014-02-09 (×3): qty 1

## 2014-02-09 NOTE — Progress Notes (Signed)
1 Day Post-Op  Subjective: Complains of pain, will not roll to side or sit up for exam.  Some flatus, denies every having issues with DT's or withdrawal issues.  He says he's trying to get disability for vision loss and Bipolar.  Objective: Vital signs in last 24 hours: Temp:  [97.5 F (36.4 C)-98.8 F (37.1 C)] 98.8 F (37.1 C) (03/03 0622) Pulse Rate:  [76-96] 96 (03/03 0622) Resp:  [13-20] 18 (03/03 0622) BP: (145-167)/(84-99) 154/88 mmHg (03/03 0622) SpO2:  [96 %-100 %] 96 % (03/03 0622) Weight:  [83 kg (182 lb 15.7 oz)] 83 kg (182 lb 15.7 oz) (03/02 2300)  NPO x icechips BP up some, afebrile,  Plaetlets low  Intake/Output from previous day: 03/02 0701 - 03/03 0700 In: 1666.7 [I.V.:1666.7] Out: 2450 [Urine:1825; Blood:25] Intake/Output this shift:    General appearance: alert, cooperative, no distress and hurts and does not want to move. Resp: clear to ascultation anterior, I cannot get him to sit up or roll to his side. GI: soft, sore, few BS, hypoactive, not really distended. some flatus  Lab Results:   Recent Labs  02/08/14 0719 02/09/14 0350  WBC 6.2 5.4  HGB 9.9* 11.9*  HCT 28.3* 35.1*  PLT 120* 65*    BMET  Recent Labs  02/08/14 0719 02/09/14 0350  NA 137 143  K 4.5 3.7  CL 105 110  CO2 17* 21  GLUCOSE 122* 88  BUN 12 12  CREATININE 0.91 1.01  CALCIUM 8.5 7.9*   PT/INR  Recent Labs  02/08/14 1150  LABPROT 15.6*  INR 1.27     Recent Labs Lab 02/08/14 1111  AST 31  ALT 12  ALKPHOS 118*  BILITOT 0.4  PROT 7.9  ALBUMIN 2.1*     Lipase  No results found for this basename: lipase     Studies/Results: Ct Abdomen Pelvis W Contrast  02/08/2014   CLINICAL DATA:  Right testicular pain and swelling onset this morning, with history of recurring pain to this area  EXAM: CT ABDOMEN AND PELVIS WITH CONTRAST  TECHNIQUE: Multidetector CT imaging of the abdomen and pelvis was performed using the standard protocol following bolus administration  of intravenous contrast.  CONTRAST:  153mL OMNIPAQUE IOHEXOL 300 MG/ML  SOLN  COMPARISON:  US SCROTUM dated 04/10/2004; [place] ART/VEN ABD/PELV/SCROTUM DOPPLER dated 04/10/2004  FINDINGS: Visualized lung bases clear.  Small volume of ascites seen around the liver, and into the gallbladder fossa as was trace ascites in the right lower quadrant. No focal hepatic abnormalities. Gallbladder is mildly distended with mild wall thickening. No gallstones identified. Pancreas and spleen are normal. Adrenal glands show no significant findings, with an 8 mm nodule left adrenal apex likely benign. Kidneys are normal. Now calcification of the aorta noted. Appendix is normal.  Bladder is normal. Distal ileum herniates into the right inguinal canal and into the right scrotum were there is a hydrocele. There is mild bowel wall thickening involving herniated bowel with mild inflammatory change in the inguinal canal. Immediately proximal loop of small bowel is mildly dilated to a diameter of 3.4 cm. Terminal ileum is decompressed. There is moderate wall thickening of the cecum and proximal ascending colon with mild surrounding inflammatory change.  There are no acute osseous abnormalities.  IMPRESSION: 1. Right inguinal hernia containing distal small bowel with findings to suggest possibility of strangulation and developing obstruction. 2. Evidence of wall thickening and inflammatory change involving proximal colon. Uncertain of this is related to the small bowel containing  inguinal hernia, with considerations including secondary inflammation by adjacent spread versus vascular compromise. 3. Small volume ascites in the abdomen. Gallbladder is distended to about 11 cm with mild wall thickening. Uncertain if this is related to the inflammatory process in the pelvis or was of this represents a separate concurrent inflammatory process involving the gallbladder. Critical Value/emergent results were called by telephone at the time of  interpretation on 02/08/2014 at 9:56 AM to the physician treating the patient in the emergency department, who verbally acknowledged these results.   Electronically Signed   By: Skipper Cliche M.D.   On: 02/08/2014 09:58    Medications: . enoxaparin (LOVENOX) injection  40 mg Subcutaneous Q24H  . ertapenem  1 g Intravenous Once   . sodium chloride 100 mL/hr at 02/09/14 0020   Prior to Admission medications   Medication Sig Start Date End Date Taking? Authorizing Provider  carvedilol (COREG) 12.5 MG tablet Take 1 tablet (12.5 mg total) by mouth 2 (two) times daily. 01/29/14  Yes Candee Furbish, MD  citalopram (CELEXA) 20 MG tablet Take 20 mg by mouth daily.   Yes Historical Provider, MD  hydrOXYzine (ATARAX/VISTARIL) 50 MG tablet Take 50 mg by mouth daily at 10 pm.   Yes Historical Provider, MD  ibuprofen (ADVIL,MOTRIN) 200 MG tablet Take 400 mg by mouth every 6 (six) hours as needed for moderate pain.   Yes Historical Provider, MD  paliperidone (INVEGA) 6 MG 24 hr tablet Take 6 mg by mouth daily.   Yes Historical Provider, MD  lisinopril (PRINIVIL,ZESTRIL) 10 MG tablet Take 1 tablet (10 mg total) by mouth daily. 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  Start to mobilize.  It doesn't look like he's had anything since 1230 AM.  D/c foley.    LOS: 1 day    Shelley Pooley 02/09/2014

## 2014-02-09 NOTE — Progress Notes (Signed)
Patient interviewed and examined, agree with PA note above. Wound clean with minimal swelling  Edward Jolly MD, FACS  02/09/2014 9:45 AM

## 2014-02-10 ENCOUNTER — Encounter (HOSPITAL_COMMUNITY): Payer: Self-pay | Admitting: General Surgery

## 2014-02-10 DIAGNOSIS — F17200 Nicotine dependence, unspecified, uncomplicated: Secondary | ICD-10-CM | POA: Diagnosis present

## 2014-02-10 DIAGNOSIS — F1010 Alcohol abuse, uncomplicated: Secondary | ICD-10-CM | POA: Diagnosis present

## 2014-02-10 DIAGNOSIS — D638 Anemia in other chronic diseases classified elsewhere: Secondary | ICD-10-CM | POA: Diagnosis present

## 2014-02-10 DIAGNOSIS — M549 Dorsalgia, unspecified: Secondary | ICD-10-CM | POA: Diagnosis present

## 2014-02-10 DIAGNOSIS — F1410 Cocaine abuse, uncomplicated: Secondary | ICD-10-CM | POA: Diagnosis present

## 2014-02-10 DIAGNOSIS — F1210 Cannabis abuse, uncomplicated: Secondary | ICD-10-CM | POA: Diagnosis present

## 2014-02-10 DIAGNOSIS — N183 Chronic kidney disease, stage 3 unspecified: Secondary | ICD-10-CM | POA: Diagnosis present

## 2014-02-10 DIAGNOSIS — F319 Bipolar disorder, unspecified: Secondary | ICD-10-CM | POA: Diagnosis present

## 2014-02-10 DIAGNOSIS — M109 Gout, unspecified: Secondary | ICD-10-CM | POA: Diagnosis not present

## 2014-02-10 DIAGNOSIS — G8929 Other chronic pain: Secondary | ICD-10-CM | POA: Diagnosis present

## 2014-02-10 DIAGNOSIS — K4030 Unilateral inguinal hernia, with obstruction, without gangrene, not specified as recurrent: Secondary | ICD-10-CM | POA: Diagnosis present

## 2014-02-10 DIAGNOSIS — I129 Hypertensive chronic kidney disease with stage 1 through stage 4 chronic kidney disease, or unspecified chronic kidney disease: Secondary | ICD-10-CM | POA: Diagnosis present

## 2014-02-10 DIAGNOSIS — D696 Thrombocytopenia, unspecified: Secondary | ICD-10-CM | POA: Diagnosis present

## 2014-02-10 LAB — CBC
HEMATOCRIT: 29.6 % — AB (ref 39.0–52.0)
Hemoglobin: 10 g/dL — ABNORMAL LOW (ref 13.0–17.0)
MCH: 26.4 pg (ref 26.0–34.0)
MCHC: 33.8 g/dL (ref 30.0–36.0)
MCV: 78.1 fL (ref 78.0–100.0)
PLATELETS: 130 10*3/uL — AB (ref 150–400)
RBC: 3.79 MIL/uL — ABNORMAL LOW (ref 4.22–5.81)
RDW: 23 % — AB (ref 11.5–15.5)
WBC: 9.2 10*3/uL (ref 4.0–10.5)

## 2014-02-10 LAB — URIC ACID: Uric Acid, Serum: 8.6 mg/dL — ABNORMAL HIGH (ref 4.0–7.8)

## 2014-02-10 LAB — COMPREHENSIVE METABOLIC PANEL
ALBUMIN: 1.8 g/dL — AB (ref 3.5–5.2)
ALK PHOS: 118 U/L — AB (ref 39–117)
ALT: 9 U/L (ref 0–53)
AST: 24 U/L (ref 0–37)
BUN: 14 mg/dL (ref 6–23)
CHLORIDE: 109 meq/L (ref 96–112)
CO2: 20 mEq/L (ref 19–32)
Calcium: 8.2 mg/dL — ABNORMAL LOW (ref 8.4–10.5)
Creatinine, Ser: 1.24 mg/dL (ref 0.50–1.35)
GFR calc Af Amer: 75 mL/min — ABNORMAL LOW (ref >90)
GFR calc non Af Amer: 65 mL/min — ABNORMAL LOW (ref >90)
Glucose, Bld: 111 mg/dL — ABNORMAL HIGH (ref 70–99)
POTASSIUM: 4.1 meq/L (ref 3.7–5.3)
Sodium: 143 mEq/L (ref 137–147)
Total Bilirubin: 0.7 mg/dL (ref 0.3–1.2)
Total Protein: 7.3 g/dL (ref 6.0–8.3)

## 2014-02-10 LAB — PROTIME-INR
INR: 1.41 (ref 0.00–1.49)
Prothrombin Time: 16.9 seconds — ABNORMAL HIGH (ref 11.6–15.2)

## 2014-02-10 LAB — APTT: aPTT: 37 seconds (ref 24–37)

## 2014-02-10 MED ORDER — METHYLPREDNISOLONE ACETATE 80 MG/ML IJ SUSP
Freq: Once | INTRAMUSCULAR | Status: AC
  Administered 2014-02-10: 17:00:00 via INTRA_ARTICULAR
  Filled 2014-02-10 (×2): qty 1

## 2014-02-10 NOTE — Evaluation (Signed)
Physical Therapy Evaluation Patient Details Name: Edwin Love MRN: [id number] DOB: 1961/10/24 Today's Date: 02/10/2014 Time: 5009-3818 PT Time Calculation (min): 26 min  PT Assessment / Plan / Recommendation History of Present Illness  patient is a 53 yo male s/p HERNIA REPAIR INGUINAL INCARCERATED (Right).  Clinical Impression  Patient demonstrates deficits in functional mobility as indicated below. Will need skilled PT to address deficits and maximize function. Will need assist from family upon discharge, if family unable to provide and patient does not progress, may need ST SNF. Will see as indicated and progress as tolerated.    PT Assessment  Patient needs continued PT services    Follow Up Recommendations  Supervision/Assistance - 24 hour    Does the patient have the potential to tolerate intense rehabilitation      Barriers to Discharge Inaccessible home environment;Decreased caregiver support (family not entirely reliable) stairs to enter the home    Equipment Recommendations  Rolling walker with 5" wheels       Frequency Min 4X/week    Precautions / Restrictions Precautions Precautions: Fall   Pertinent Vitals/Pain 8/10      Mobility  Bed Mobility Overal bed mobility: Needs Assistance Bed Mobility: Rolling;Sidelying to Sit Rolling: Min guard Sidelying to sit: Min assist General bed mobility comments: increased time and VCs for positioning with bed mobility, patient very limited by pain, difficulty coming to upright EOB on his own without minimal assist Transfers Overall transfer level: Needs assistance Equipment used: Rolling walker (2 wheeled) Transfers: Sit to/from [facility] Sit to Stand: Mod assist Stand pivot transfers: Min assist General transfer comment: Mod assist to come to upright, VCs for hand placement, assist for elevation and stability using RW. patient complains of LLE pain during  standing Ambulation/Gait Ambulation/Gait assistance: Mod assist Ambulation Distance (Feet): 3 Feet Assistive device: Rolling walker (2 wheeled)    Exercises     PT Diagnosis: Difficulty walking;Acute pain;Abnormality of gait  PT Problem List: Decreased strength;Decreased range of motion;Decreased activity tolerance;Decreased balance;Decreased mobility;Decreased knowledge of use of DME;Pain PT Treatment Interventions: DME instruction;Stair training;Gait training;Functional mobility training;Therapeutic activities;Therapeutic exercise;Balance training;Patient/family education     PT Goals(Current goals can be found in the care plan section) Acute Rehab PT Goals Patient Stated Goal: to go home tomorrow PT Goal Formulation: With patient Time For Goal Achievement: 02/24/14 Potential to Achieve Goals: Fair  Visit Information  Last PT Received On: 02/10/14 Assistance Needed: +1 History of Present Illness: patient is a 53 yo male s/p HERNIA REPAIR INGUINAL INCARCERATED (Right).       Prior Functioning  Home Living Family/patient expects to be discharged to:: Private residence Living Arrangements: Parent;Other relatives Available Help at Discharge: Family Type of Home: House Home Access: Stairs to enter [employer] of Steps: 3 Entrance Stairs-Rails: Can reach both Home Layout: Two level;Able to live on main level with bedroom/bathroom Home Equipment: Cane - single point Additional Comments: walk in shower Prior Function Level of Independence: Independent Comments: occasional use of cane Communication Communication: No difficulties Dominant Hand: Right    Cognition  Cognition Arousal/Alertness: Awake/alert Behavior During Therapy: WFL for tasks assessed/performed Overall Cognitive Status: Within Functional Limits for tasks assessed    Extremity/Trunk Assessment Upper Extremity Assessment Upper Extremity Assessment: Overall WFL for tasks assessed Lower Extremity  Assessment Lower Extremity Assessment: LLE deficits/detail LLE Deficits / Details: complains of pain in LLE      End of Session PT - End of Session Equipment Utilized During Treatment: Gait belt Activity Tolerance:  Patient limited by pain Patient left: in chair;with call bell/phone within reach Nurse Communication: Mobility status  GP Functional Assessment Tool Used: clinical judgement Functional Limitation: Mobility: Walking and moving around Mobility: Walking and Moving Around Current Status (O1224): 0 percent impaired, limited or restricted Mobility: Walking and Moving Around Goal Status (M2500): At least 40 percent but less than 60 percent impaired, limited or restricted   Duncan Dull 02/10/2014, 1:11 PM Alben Deeds, [place] DPT  [phone]

## 2014-02-10 NOTE — Progress Notes (Signed)
UR completed. Patient changed to inpatient- slow to progress- requiring IVF @ 50

## 2014-02-10 NOTE — ED Provider Notes (Signed)
Medical screening examination/treatment/procedure(s) were performed by non-physician practitioner and as supervising physician I was immediately available for consultation/collaboration.   EKG Interpretation None        Vanesa Renier, MD 02/10/14 2308 

## 2014-02-10 NOTE — Progress Notes (Signed)
2 Days Post-Op  Subjective: He did get out of bed and into bathroom, but it does not sound like he left the room.  He says he has flatus and BM, but I don't see anything to document this.  He keeps his eyes closed thru most of our talk and does not want to sit up but was able to.  He lives with his parents according to the patient.  He was asking about his shoes also.  Objective: Vital signs in last 24 hours: Temp:  [97.8 F (36.6 C)-99.9 F (37.7 C)] 98.4 F (36.9 C) (03/04 0512) Pulse Rate:  [72-90] 80 (03/04 0512) Resp:  [16-20] 17 (03/04 0512) BP: (138-172)/(71-96) 141/80 mmHg (03/04 0512) SpO2:  [93 %-98 %] 95 % (03/04 0512)   120 PO recorded  Diet: clears Afebrile, BP up some,  Labs ok yesterday  Intake/Output from previous day: 03/03 0701 - 03/04 0700 In: 2710 [P.O.:120; I.V.:2590] Out: 825 [Urine:825] Intake/Output this shift:    General appearance: alert, cooperative and no distress Resp: clear to auscultation bilaterally GI: soft, non-tender; bowel sounds normal; no masses,  no organomegaly and the incision looks fine.  Lab Results:   Recent Labs  02/08/14 0719 02/09/14 0350  WBC 6.2 5.4  HGB 9.9* 11.9*  HCT 28.3* 35.1*  PLT 120* 65*    BMET  Recent Labs  02/08/14 0719 02/09/14 0350  NA 137 143  K 4.5 3.7  CL 105 110  CO2 17* 21  GLUCOSE 122* 88  BUN 12 12  CREATININE 0.91 1.01  CALCIUM 8.5 7.9*   PT/INR  Recent Labs  02/08/14 1150  LABPROT 15.6*  INR 1.27     Recent Labs Lab 02/08/14 1111 02/09/14 0830  AST 31 28  ALT 12 9  ALKPHOS 118* 101  BILITOT 0.4 0.7  PROT 7.9 6.8  ALBUMIN 2.1* 1.7*     Lipase  No results found for this basename: lipase     Studies/Results: Ct Abdomen Pelvis W Contrast  02/08/2014   CLINICAL DATA:  Right testicular pain and swelling onset this morning, with history of recurring pain to this area  EXAM: CT ABDOMEN AND PELVIS WITH CONTRAST  TECHNIQUE: Multidetector CT imaging of the abdomen and  pelvis was performed using the standard protocol following bolus administration of intravenous contrast.  CONTRAST:  164mL OMNIPAQUE IOHEXOL 300 MG/ML  SOLN  COMPARISON:  US SCROTUM dated 04/10/2004; [place] ART/VEN ABD/PELV/SCROTUM DOPPLER dated 04/10/2004  FINDINGS: Visualized lung bases clear.  Small volume of ascites seen around the liver, and into the gallbladder fossa as was trace ascites in the right lower quadrant. No focal hepatic abnormalities. Gallbladder is mildly distended with mild wall thickening. No gallstones identified. Pancreas and spleen are normal. Adrenal glands show no significant findings, with an 8 mm nodule left adrenal apex likely benign. Kidneys are normal. Now calcification of the aorta noted. Appendix is normal.  Bladder is normal. Distal ileum herniates into the right inguinal canal and into the right scrotum were there is a hydrocele. There is mild bowel wall thickening involving herniated bowel with mild inflammatory change in the inguinal canal. Immediately proximal loop of small bowel is mildly dilated to a diameter of 3.4 cm. Terminal ileum is decompressed. There is moderate wall thickening of the cecum and proximal ascending colon with mild surrounding inflammatory change.  There are no acute osseous abnormalities.  IMPRESSION: 1. Right inguinal hernia containing distal small bowel with findings to suggest possibility of strangulation and developing obstruction. 2.  Evidence of wall thickening and inflammatory change involving proximal colon. Uncertain of this is related to the small bowel containing inguinal hernia, with considerations including secondary inflammation by adjacent spread versus vascular compromise. 3. Small volume ascites in the abdomen. Gallbladder is distended to about 11 cm with mild wall thickening. Uncertain if this is related to the inflammatory process in the pelvis or was of this represents a separate concurrent inflammatory process involving the gallbladder.  Critical Value/emergent results were called by telephone at the time of interpretation on 02/08/2014 at 9:56 AM to the physician treating the patient in the emergency department, who verbally acknowledged these results.   Electronically Signed   By: Skipper Cliche M.D.   On: 02/08/2014 09:58    Medications: . carvedilol  12.5 mg Oral BID  . citalopram  20 mg Oral Daily  . enoxaparin (LOVENOX) injection  40 mg Subcutaneous Q24H  . ertapenem  1 g Intravenous Once  . folic acid  1 mg Oral Daily  . hydrOXYzine  50 mg Oral Q2200  . lisinopril  10 mg Oral Daily  . multivitamin with minerals  1 tablet Oral Daily  . paliperidone  6 mg Oral Daily  . thiamine  100 mg Oral Daily   Or  . 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  PLan:  Ask case management to see and talk with family about going home tomorrow.  Mobilize, ask PT to see how he does in the halls.  Advance diet to full liquids now and soft diet by supper if he does well.  Recheck labs in AM. Plan: He is on Lovenox, I doubt it is DVT, but will start with dopplers and if negative will need Ortho consult, for left knee effusion.  Recheck labs now. uRIC ACID IS 8.6.  Orthopedic has been called.  LOS: 2 days    Raynette Arras 02/10/2014

## 2014-02-10 NOTE — Consult Note (Signed)
Reason for Consult: swollen l. knee Referring Physician: hospitalists  Edwin Love is an 53 y.o. male.  HPI: 53 yo male s/p hernia repair with painful swollen l knee.  We are consulted for evaluation and treatment.Pt really gives no history and only grunts.  Past Medical History  Diagnosis Date  . Hypertension   . Back pain   . Renal disorder   . Anemia   . LVH (left ventricular hypertrophy)     Past Surgical History  Procedure Laterality Date  . Eye surgery Left 1990's    Foreign body  . Inguinal hernia repair Right 02/08/2014    Procedure: HERNIA REPAIR INGUINAL INCARCERATED;  Surgeon: Mariella Saa, MD;  Location: [facility] OR;  Service: General;  Laterality: Right;  . Insertion of mesh Right 02/08/2014    Procedure: INSERTION OF MESH;  Surgeon: Mariella Saa, MD;  Location: [facility] OR;  Service: General;  Laterality: Right;    Family History  Problem Relation Age of Onset  . Diabetes Brother   . Hypertension      family    Social History:  reports that he has been smoking Cigarettes.  He has a 17 pack-year smoking history. He does not have any smokeless tobacco history on file. He reports that he drinks alcohol. He reports that he uses illicit drugs (Marijuana and Cocaine).  Allergies: No Known Allergies  Medications: I have reviewed the patient's current medications.  Results for orders placed during the hospital encounter of 02/08/14 (from the past 48 hour(s))  URINALYSIS, ROUTINE W REFLEX MICROSCOPIC     Status: Abnormal   Collection Time    02/08/14  6:50 PM      Result Value Ref Range   Color, Urine YELLOW  YELLOW   APPearance CLEAR  CLEAR   Specific Gravity, Urine 1.030  1.005 - 1.030   pH 5.5  5.0 - 8.0   Glucose, UA NEGATIVE  NEGATIVE mg/dL   Hgb urine dipstick SMALL (*) NEGATIVE   Bilirubin Urine NEGATIVE  NEGATIVE   Ketones, ur NEGATIVE  NEGATIVE mg/dL   Protein, ur 751 (*) NEGATIVE mg/dL   Urobilinogen, UA 1.0  0.0 - 1.0 mg/dL   Nitrite  NEGATIVE  NEGATIVE   Leukocytes, UA NEGATIVE  NEGATIVE  URINE MICROSCOPIC-ADD ON     Status: None   Collection Time    02/08/14  6:50 PM      Result Value Ref Range   Squamous Epithelial / LPF RARE  RARE   WBC, UA 0-2  <3 WBC/hpf   RBC / HPF 0-2  <3 RBC/hpf   Bacteria, UA RARE  RARE  BASIC METABOLIC PANEL     Status: Abnormal   Collection Time    02/09/14  3:50 AM      Result Value Ref Range   Sodium 143  137 - 147 mEq/L   Potassium 3.7  3.7 - 5.3 mEq/L   Comment: DELTA CHECK NOTED   Chloride 110  96 - 112 mEq/L   CO2 21  19 - 32 mEq/L   Glucose, Bld 88  70 - 99 mg/dL   BUN 12  6 - 23 mg/dL   Creatinine, Ser 9.82  0.50 - 1.35 mg/dL   Calcium 7.9 (*) 8.4 - 10.5 mg/dL   GFR calc non Af Amer 83 (*) >90 mL/min   GFR calc Af Amer >90  >90 mL/min   Comment: (NOTE)     The eGFR has been calculated using the CKD EPI equation.  This calculation has not been validated in all clinical situations.     eGFR's persistently <90 mL/min signify possible Chronic Kidney     Disease.  CBC     Status: Abnormal   Collection Time    02/09/14  3:50 AM      Result Value Ref Range   WBC 5.4  4.0 - 10.5 K/uL   RBC 4.57  4.22 - 5.81 MIL/uL   Hemoglobin 11.9 (*) 13.0 - 17.0 g/dL   Comment: DELTA CHECK NOTED     REPEATED TO VERIFY   HCT 35.1 (*) 39.0 - 52.0 %   MCV 76.8 (*) 78.0 - 100.0 fL   MCH 26.0  26.0 - 34.0 pg   MCHC 33.9  30.0 - 36.0 g/dL   RDW 22.8 (*) 11.5 - 15.5 %   Platelets 65 (*) 150 - 400 K/uL   Comment: PLATELET COUNT CONFIRMED BY SMEAR     REPEATED TO VERIFY  HEPATIC FUNCTION PANEL     Status: Abnormal   Collection Time    02/09/14  8:30 AM      Result Value Ref Range   Total Protein 6.8  6.0 - 8.3 g/dL   Albumin 1.7 (*) 3.5 - 5.2 g/dL   AST 28  0 - 37 U/L   ALT 9  0 - 53 U/L   Alkaline Phosphatase 101  39 - 117 U/L   Total Bilirubin 0.7  0.3 - 1.2 mg/dL   Bilirubin, Direct 0.3  0.0 - 0.3 mg/dL   Indirect Bilirubin 0.4  0.3 - 0.9 mg/dL  CBC     Status: Abnormal  (Preliminary result)   Collection Time    02/10/14  2:30 PM      Result Value Ref Range   WBC 9.2  4.0 - 10.5 K/uL   RBC 3.79 (*) 4.22 - 5.81 MIL/uL   Hemoglobin 10.0 (*) 13.0 - 17.0 g/dL   HCT 29.6 (*) 39.0 - 52.0 %   MCV 78.1  78.0 - 100.0 fL   MCH 26.4  26.0 - 34.0 pg   MCHC 33.8  30.0 - 36.0 g/dL   RDW 23.0 (*) 11.5 - 15.5 %   Platelets PENDING  150 - 400 K/uL  COMPREHENSIVE METABOLIC PANEL     Status: Abnormal   Collection Time    02/10/14  2:30 PM      Result Value Ref Range   Sodium 143  137 - 147 mEq/L   Potassium 4.1  3.7 - 5.3 mEq/L   Chloride 109  96 - 112 mEq/L   CO2 20  19 - 32 mEq/L   Glucose, Bld 111 (*) 70 - 99 mg/dL   BUN 14  6 - 23 mg/dL   Creatinine, Ser 1.24  0.50 - 1.35 mg/dL   Calcium 8.2 (*) 8.4 - 10.5 mg/dL   Total Protein 7.3  6.0 - 8.3 g/dL   Albumin 1.8 (*) 3.5 - 5.2 g/dL   AST 24  0 - 37 U/L   ALT 9  0 - 53 U/L   Alkaline Phosphatase 118 (*) 39 - 117 U/L   Total Bilirubin 0.7  0.3 - 1.2 mg/dL   GFR calc non Af Amer 65 (*) >90 mL/min   GFR calc Af Amer 75 (*) >90 mL/min   Comment: (NOTE)     The eGFR has been calculated using the CKD EPI equation.     This calculation has not been validated in all clinical situations.  eGFR's persistently <90 mL/min signify possible Chronic Kidney     Disease.  URIC ACID     Status: Abnormal   Collection Time    02/10/14  2:30 PM      Result Value Ref Range   Uric Acid, Serum 8.6 (*) 4.0 - 7.8 mg/dL  APTT     Status: None   Collection Time    02/10/14  2:30 PM      Result Value Ref Range   aPTT 37  24 - 37 seconds   Comment:            IF BASELINE aPTT IS ELEVATED,     SUGGEST PATIENT RISK ASSESSMENT     BE USED TO DETERMINE APPROPRIATE     ANTICOAGULANT THERAPY.  PROTIME-INR     Status: Abnormal   Collection Time    02/10/14  2:30 PM      Result Value Ref Range   Prothrombin Time 16.9 (*) 11.6 - 15.2 seconds   INR 1.41  0.00 - 1.49    No results found.  ROS ROS: I have reviewed the  patient's review of systems thoroughly and there are no positive responses as relates to the HPI. EXAM: Blood pressure 155/82, pulse 72, temperature 98.7 F (37.1 C), temperature source Oral, resp. rate 19, height $RemoveBe'5\' 9"'PCpexsKhQ$  (1.753 m), weight 182 lb 15.7 oz (83 kg), SpO2 96.00%. Physical Exam Well-developed well-nourished patient in no acute distress. Alert and oriented x3 HEENT:within normal limits Cardiac: Regular rate and rhythm Pulmonary: Lungs clear to auscultation Abdomen: Soft and nontender.  Normal active bowel sounds  Musculoskeletal:l knee: 2+ effusion and pain with rom.  Minimal erythema Assessment/Plan: 53 yo male with painful swollen l knee/  We aspirated 90cc of fluid that appeared top be gout.  Fluid was flushed by nurse before we could send it for evaluation.  Given the consistency with gout, we felt injection with cortisone indicated and he was injected with 6 cc marcaine and 2cc $Remov'80mg'xMJmyj$ /cc depomedrol under sterile condition.  We will follow in hospital and in office if discharged.  Altheria Shadoan L 02/10/2014, 6:04 PM

## 2014-02-10 NOTE — Progress Notes (Signed)
Patient interviewed and examined, agree with PA note above.  Edward Jolly MD, FACS  02/10/2014 7:57 PM
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
## 2014-02-11 ENCOUNTER — Telehealth (INDEPENDENT_AMBULATORY_CARE_PROVIDER_SITE_OTHER): Payer: Self-pay | Admitting: *Deleted

## 2014-02-11 MED ORDER — THIAMINE HCL 100 MG PO TABS: 100.0000 mg | ORAL_TABLET | Freq: Every day | ORAL | Status: DC

## 2014-02-11 MED ORDER — ADULT MULTIVITAMIN W/MINERALS CH: 1.0000 | ORAL_TABLET | Freq: Every day | ORAL | Status: DC

## 2014-02-11 MED ORDER — OXYCODONE-ACETAMINOPHEN 5-325 MG PO TABS: 1.0000 | ORAL_TABLET | Freq: Four times a day (QID) | ORAL | Status: DC | PRN

## 2014-02-11 MED ORDER — LISINOPRIL 10 MG PO TABS: 10.0000 mg | ORAL_TABLET | Freq: Every day | ORAL | Status: DC

## 2014-02-11 MED ORDER — CARVEDILOL 12.5 MG PO TABS: 12.5000 mg | ORAL_TABLET | Freq: Two times a day (BID) | ORAL | Status: DC

## 2014-02-11 NOTE — Discharge Instructions (Signed)
[facility]      [facility] [phone]  OPEN ABDOMINAL SURGERY: POST OP INSTRUCTIONS  Always review your discharge instruction sheet given to you by the facility where your surgery was performed.  IF YOU HAVE DISABILITY OR FAMILY LEAVE FORMS, YOU MUST BRING THEM TO THE OFFICE FOR PROCESSING.  PLEASE DO NOT GIVE THEM TO YOUR DOCTOR.  1. A prescription for pain medication may be given to you upon discharge.  Take your pain medication as prescribed, if needed.  If narcotic pain medicine is not needed, then you may take acetaminophen (Tylenol) or ibuprofen (Advil) as needed. 2. Take your usually prescribed medications unless otherwise directed. 3. If you need a refill on your pain medication, please contact your pharmacy. They will contact our office to request authorization.  Prescriptions will not be filled after 5pm or on week-ends. 4. You should follow a light diet the first few days after arrival home, such as soup and crackers, pudding, etc.unless your doctor has advised otherwise. A high-fiber, low fat diet can be resumed as tolerated.   Be sure to include lots of fluids daily. Most patients will experience some swelling and bruising on the chest and neck area.  Ice packs will help.  Swelling and bruising can take several days to resolve 5. Most patients will experience some swelling and bruising in the area of the incision. Ice pack will help. Swelling and bruising can take several days to resolve..  6. It is common to experience some constipation if taking pain medication after surgery.  Increasing fluid intake and taking a stool softener will usually help or prevent this problem from occurring.  A mild laxative (Milk of Magnesia or Miralax) should be taken according to package directions if there are no bowel movements after 48 hours. 7.  You may have steri-strips (small skin tapes) in place directly over the incision.  These strips should be left on the skin for 7-10 days.  If your  surgeon used skin glue on the incision, you may shower in 24 hours.  The glue will flake off over the next 2-3 weeks.  Any sutures or staples will be removed at the office during your follow-up visit. You may find that a light gauze bandage over your incision may keep your staples from being rubbed or pulled. You may shower and replace the bandage daily. 8. ACTIVITIES:  You may resume regular (light) daily activities beginning the next day--such as daily self-care, walking, climbing stairs--gradually increasing activities as tolerated.  You may have sexual intercourse when it is comfortable.  Refrain from any heavy lifting or straining until approved by your doctor. a. You may drive when you no longer are taking prescription pain medication, you can comfortably wear a seatbelt, and you can safely maneuver your car and apply brakes b. Return to Work: ___________________________________ 9. You should see your doctor in the office for a follow-up appointment approximately two weeks after your surgery.  Make sure that you call for this appointment within a day or two after you arrive home to insure a convenient appointment time. OTHER INSTRUCTIONS:  _____________________________________________________________ _____________________________________________________________  WHEN TO CALL YOUR DOCTOR: 1. Fever over 101.0 2. Inability to urinate 3. Nausea and/or vomiting 4. Extreme swelling or bruising 5. Continued bleeding from incision. 6. Increased pain, redness, or drainage from the incision. 7. Difficulty swallowing or breathing 8. Muscle cramping or spasms. 9. Numbness or tingling in hands or feet or around lips.  The clinic staff is available to   answer your questions during regular business hours.  Please don't hesitate to call and ask to speak to one of the nurses if you have concerns.  For further questions, please visit [email or web address]   

## 2014-02-11 NOTE — Discharge Summary (Signed)
Physician Discharge Summary  Patient ID: Edwin Love MRN: [id number] DOB/AGE: [birth year] 53 y.o.  Admit date: 02/08/2014 Discharge date: 02/11/2014  Admitting Diagnosis: Incarcerated right inguinal hernia Scrotal swelling  Discharge Diagnosis Patient Active Problem List   Diagnosis Date Noted  . Incarcerated inguinal hernia 02/08/2014  . Chest pain 01/29/2014  . Tachycardia 01/29/2014  . CKD (chronic kidney disease) stage 3, GFR 30-59 ml/min 01/29/2014  . Tobacco abuse 01/29/2014  . Depression 01/29/2014    Consultants None  Imaging: No results found.  Procedures Dr. Excell Seltzer (02/08/14) - Repair incarcerated inguinal hernia.  Hospital Course:  53 y.o. male with past medical history of HTN, Stage III Kidney disease, LVH, and chronic back pain who arrived with EMS from home reporting right testicular pain/swelling that started at 0200 on 02/08/14. Patient states he has had recurrent pain and swelling to the same area in the past. He was referred to a urologist but did not follow up due to cost. He rates the pain 8/10 and states that it does not radiate anywhere. He denies any aggravating/relieving factors. He states he sought medical care for one previous episode, but has had at least two similar episodes where he did not seek care and the pain and swelling resolved on its own. Pt denies hernia to any other area and denies any abdominal surgeries (hernia repair or otherwise). Pt reports drinking a "40oz" a day and admits to sometimes drinking more. Denies any recent hx of drug use, but states he has used drugs in the past.   Pt denies injury, fever, chills, chest pain, SOB, dysuria, pain with bowel movements, blood in his stool, and N/V. No h/o other hernias or any abdominal surgeries. Pt states he may have lost weight recently, but doesn't keep track of it. Pt states he is scheduled for a cardiac workup with Dr. Marlou Porch later this month after going to the hospital with chest pain.   The patient was found to have both clinically and radiographically a right incarcerated inguinal hernia, with elevated lactic acid.  Patient was admitted and underwent procedure listed above.  Tolerated procedure well and was transferred to the floor.  Had significant post-op pain which was eventually well controlled.  Diet was advanced as tolerated.  On POD #3, the patient was voiding well, tolerating diet, ambulating well, pain well controlled, vital signs stable, incisions c/d/i and felt stable for discharge home.  Patient will follow up in our office in 2-3 weeks and knows to call with questions or concerns.  Physical Exam: General:  Alert, NAD, pleasant, comfortable Abd:  Soft, ND, mild tenderness, incision in R groin C/D/I with dermabond in place    Medication List         carvedilol 12.5 MG tablet  Commonly known as:  COREG  Take 1 tablet (12.5 mg total) by mouth 2 (two) times daily.     carvedilol 12.5 MG tablet  Commonly known as:  COREG  Take 1 tablet (12.5 mg total) by mouth 2 (two) times daily.     citalopram 20 MG tablet  Commonly known as:  CELEXA  Take 20 mg by mouth daily.     hydrOXYzine 50 MG tablet  Commonly known as:  ATARAX/VISTARIL  Take 50 mg by mouth daily at 10 pm.     ibuprofen 200 MG tablet  Commonly known as:  ADVIL,MOTRIN  Take 400 mg by mouth every 6 (six) hours as needed for moderate pain.     lisinopril 10 MG tablet  Commonly known as:  PRINIVIL,ZESTRIL  Take 1 tablet (10 mg total) by mouth daily.     lisinopril 10 MG tablet  Commonly known as:  PRINIVIL,ZESTRIL  Take 1 tablet (10 mg total) by mouth daily.     multivitamin with minerals Tabs tablet  Take 1 tablet by mouth daily.     oxyCODONE-acetaminophen 5-325 MG per tablet  Commonly known as:  ROXICET  Take 1-2 tablets by mouth every 6 (six) hours as needed for moderate pain or severe pain.     paliperidone 6 MG 24 hr tablet  Commonly known as:  INVEGA  Take 6 mg by mouth daily.      thiamine 100 MG tablet  Take 1 tablet (100 mg total) by mouth daily.             Follow-up Information   Follow up with HOXWORTH,BENJAMIN T, MD In 3 weeks. (For post-operation check, arrive 30 minutes prior to your appointment time to fill out paperwork.)    Specialty:  General Surgery   Contact information:   [place] [address] [phone]       Schedule an appointment as soon as possible for a visit with No PCP Per Patient. (For a post-follow up appointment with your primary care provider.)    Specialty:  General Practice      Follow up with Candee Furbish, MD. ([place])    Specialty:  Cardiology   Contact information:   [address] [place] [address] [phone]       Signed: Coralie Keens [facility] [phone]  02/11/2014, 8:28 AM

## 2014-02-11 NOTE — Progress Notes (Signed)
Subjective: Patient much more alert and talkative today.  He states the left knee feels great.  He is no longer having difficulty up and moving around.   Objective: Vital signs in last 24 hours: Temp:  [97.6 F (36.4 C)-98.8 F (37.1 C)] 97.6 F (36.4 C) (03/05 0501) Pulse Rate:  [69-86] 69 (03/05 0754) Resp:  [17-19] 18 (03/05 0501) BP: (111-193)/(76-107) 166/99 mmHg (03/05 0754) SpO2:  [96 %-99 %] 99 % (03/05 0501)  Intake/Output from previous day: 03/04 0701 - 03/05 0700 In: 240 [P.O.:240] Out: 370 [Urine:370] Intake/Output this shift:     Recent Labs  02/09/14 0350 02/10/14 1430  HGB 11.9* 10.0*    Recent Labs  02/09/14 0350 02/10/14 1430  WBC 5.4 9.2  RBC 4.57 3.79*  HCT 35.1* 29.6*  PLT 65* 130*    Recent Labs  02/09/14 0350 02/10/14 1430  NA 143 143  K 3.7 4.1  CL 110 109  CO2 21 20  BUN 12 14  CREATININE 1.01 1.24  GLUCOSE 88 111*  CALCIUM 7.9* 8.2*    Recent Labs  02/08/14 1150 02/10/14 1430  INR 1.27 1.41    Neurologically intact ABD soft Neurovascular intact Sensation intact distally Intact pulses distally No cellulitis present Compartment soft left knee 1+ effusion today.  No instability.  Minimal pain with range of motion.  No warmth or erythema.  Assessment/Plan: Patient with significant left knee effusion which has resolved with aspiration injection.  I will plan on setting him in the office in 10-14 days for repeat evaluation.  We will sign off at this point.   Hera Celaya L 02/11/2014, 8:29 AM

## 2014-02-11 NOTE — Telephone Encounter (Signed)
Appt made for 03/04/14 @ 11a per CM.  Megan PA paged with information

## 2014-02-11 NOTE — Telephone Encounter (Signed)
Message copied by Theora Master on Thu Feb 11, 2014  8:58 AM ------      Message from: [facility]      Created: Thu Feb 11, 2014  8:13 AM       Please make appt with Dr. Excell Seltzer in 2-3 weeks for post-op check s/p inguinal hernia reduction and repair with mesh for incarcerated right inguinal hernia ------

## 2014-02-11 NOTE — Progress Notes (Signed)
Physical Therapy Treatment Patient Details Name: Edwin Love MRN: [id number] DOB: May 28, 1961 Today's Date: 02/11/2014 Time: 1037-1050 PT Time Calculation (min): 13 min  PT Assessment / Plan / Recommendation  History of Present Illness patient is a 53 yo male s/p HERNIA REPAIR INGUINAL INCARCERATED (Right).   PT Comments   Patient appears to be back to baseline with functional mobility. Some sway with ambulation but no LOB and has been ambulating in his room without assistance. HHPT for safety eval. Pt planning for DC today  Follow Up Recommendations  Home health PT;Supervision/Assistance - 24 hour     Does the patient have the potential to tolerate intense rehabilitation     Barriers to Discharge        Equipment Recommendations       Recommendations for Other Services    Frequency     Progress towards PT Goals Progress towards PT goals: Progressing toward goals  Plan Current plan remains appropriate    Precautions / Restrictions Precautions Precautions: Fall   Pertinent Vitals/Pain Denied pain    Mobility  Bed Mobility Overal bed mobility: Modified Independent Transfers Overall transfer level: Modified independent Ambulation/Gait Ambulation/Gait assistance: Supervision Ambulation Distance (Feet): 400 Feet Assistive device: None Stairs: Yes Stairs assistance: Supervision Stair Management: One rail Left;One rail Right Number of Stairs: 5 General stair comments: good technique and balance    Exercises     PT Diagnosis:    PT Problem List:   PT Treatment Interventions:     PT Goals (current goals can now be found in the care plan section)    Visit Information  Last PT Received On: 02/11/14 Assistance Needed: +1 History of Present Illness: patient is a 53 yo male s/p HERNIA REPAIR INGUINAL INCARCERATED (Right).    Subjective Data      Cognition  Cognition Arousal/Alertness: Awake/alert Behavior During Therapy: WFL for tasks  assessed/performed Overall Cognitive Status: Within Functional Limits for tasks assessed    Balance     End of Session PT - End of Session Equipment Utilized During Treatment: Gait belt Activity Tolerance: Patient tolerated treatment well Patient left: in bed;with call bell/phone within reach   GP     Jacqualyn Posey 02/11/2014, 12:14 PM 02/11/2014 Jacqualyn Posey PTA [phone] pager [phone] office

## 2014-02-11 NOTE — Progress Notes (Signed)
Discharge home. Home discharge instruction given, no questions verbalized. 

## 2014-02-11 NOTE — Discharge Summary (Signed)
Patient interviewed and examined, agree with PA note above.  Edward Jolly MD, FACS  02/11/2014 5:34 PM

## 2014-02-12 ENCOUNTER — Encounter (HOSPITAL_COMMUNITY): Payer: Self-pay

## 2014-02-16 ENCOUNTER — Ambulatory Visit (HOSPITAL_COMMUNITY): Payer: Medicaid Other | Attending: Cardiology | Admitting: Cardiology

## 2014-02-16 ENCOUNTER — Encounter: Payer: Self-pay | Admitting: Cardiology

## 2014-02-16 DIAGNOSIS — R0609 Other forms of dyspnea: Secondary | ICD-10-CM

## 2014-02-16 DIAGNOSIS — R079 Chest pain, unspecified: Secondary | ICD-10-CM | POA: Diagnosis not present

## 2014-02-16 DIAGNOSIS — R0989 Other specified symptoms and signs involving the circulatory and respiratory systems: Secondary | ICD-10-CM

## 2014-02-16 NOTE — Progress Notes (Signed)
Echo performed. 

## 2014-02-17 ENCOUNTER — Telehealth: Payer: Self-pay | Admitting: Cardiology

## 2014-02-17 NOTE — Telephone Encounter (Signed)
Returned call to patient he stated he wanted to know if Dr.Skains had a chance to review CD that he gave him.Stated he needed to send it to his lawyers.Dr.Skains out of office will send message to him.Advised to keep appointment with Dr.Skains 02/23/14.

## 2014-02-17 NOTE — Telephone Encounter (Signed)
New Message:  Pt is asking if Dr. Marlou Porch has reviewed his CD with his x-rays on it. Pt is requesting a call back to hear if the doctor has reviewed that information or not.

## 2014-02-17 NOTE — Telephone Encounter (Signed)
I was unable to open files on my computer. So no, did not get to review x-rays.

## 2014-02-18 NOTE — Telephone Encounter (Signed)
Returned call to patient Dr.Skains was unable to open files on his computer.Advised to keep appointments.

## 2014-02-22 ENCOUNTER — Ambulatory Visit (HOSPITAL_COMMUNITY): Payer: Medicaid Other | Attending: Internal Medicine | Admitting: Radiology

## 2014-02-22 ENCOUNTER — Encounter: Payer: Self-pay | Admitting: Internal Medicine

## 2014-02-22 VITALS — BP 152/97 | Ht 69.0 in | Wt 176.0 lb

## 2014-02-22 DIAGNOSIS — R0989 Other specified symptoms and signs involving the circulatory and respiratory systems: Secondary | ICD-10-CM

## 2014-02-22 DIAGNOSIS — R0609 Other forms of dyspnea: Secondary | ICD-10-CM

## 2014-02-22 DIAGNOSIS — R079 Chest pain, unspecified: Secondary | ICD-10-CM

## 2014-02-22 MED ORDER — TECHNETIUM TC 99M SESTAMIBI GENERIC - CARDIOLITE
10.0000 | Freq: Once | INTRAVENOUS | Status: AC | PRN
  Administered 2014-02-22: 10 via INTRAVENOUS

## 2014-02-22 MED ORDER — REGADENOSON 0.4 MG/5ML IV SOLN
0.4000 mg | Freq: Once | INTRAVENOUS | Status: AC
  Administered 2014-02-22: 0.4 mg via INTRAVENOUS

## 2014-02-22 MED ORDER — TECHNETIUM TC 99M SESTAMIBI GENERIC - CARDIOLITE
30.0000 | Freq: Once | INTRAVENOUS | Status: AC | PRN
  Administered 2014-02-22: 30 via INTRAVENOUS

## 2014-02-22 NOTE — Progress Notes (Signed)
[facility] [address] [place], [place] [address] [phone]    Cardiology Nuclear Med Study  Edwin Love is a 53 y.o. male     MRN : [id number]     DOB: 04-29-1961  Procedure Date: 02/22/2014  Nuclear Med Background Indication for Stress Test:  Evaluation for Ischemia and Abnormal EKG History:  02/16/14 ECHO: Severe LVH EF: 55-60% Cardiac Risk Factors: Hypertension and Smoker  Symptoms:  Chest Pain, DOE and Palpitations   Nuclear Pre-Procedure Caffeine/Decaff Intake:  10:30pm NPO After: 10:30pm   Lungs:  clear O2 Sat: 96% on room air. IV 0.9% NS with Angio Cath:  22g  IV Site: R Hand x 1, tolerated well IV Started by:  Irven Baltimore, RN  Chest Size (in):  42 Cup Size: n/a  Height: 5\' 9"  (1.753 m)  Weight:  176 lb (79.833 kg)  BMI:  Body mass index is 25.98 kg/(m^2). Tech Comments:  Took Lisinopril this am, and Coreg last night. Irven Baltimore, RN    Nuclear Med Study 1 or 2 day study: 1 day  Stress Test Type:  Carlton Adam  Reading MD: N/A  Order Authorizing Provider:  Candee Furbish, MD  Resting Radionuclide: Technetium 12m Sestamibi  Resting Radionuclide Dose: 11.0 mCi   Stress Radionuclide:  Technetium 49m Sestamibi  Stress Radionuclide Dose: 33.0 mCi           Stress Protocol Rest HR: 71 Stress HR: 100  Rest BP: 152/97 Stress BP: 161/107  Exercise Time (min): n/a METS: n/a   Predicted Max HR: 167 bpm % Max HR: 59.88 bpm Rate Pressure Product: 16100   Dose of Adenosine (mg):  n/a Dose of Lexiscan: 0.4 mg  Dose of Atropine (mg): n/a Dose of Dobutamine: n/a mcg/kg/min (at max HR)  Stress Test Technologist: Perrin Maltese, EMT-P  Nuclear Technologist:  Charlton Amor, CNMT     Rest Procedure:  Myocardial perfusion imaging was performed at rest 45 minutes following the intravenous administration of Technetium 80m Sestamibi. Rest ECG: NSR . TWI inferiorly  Stress Procedure:  The patient received IV Lexiscan 0.4 mg over  15-seconds.  Technetium 60m Sestamibi injected at 30-seconds. This patient was sob and lt. Headed with the Lexiscan injection. Quantitative spect images were obtained after a 45 minute delay. Stress ECG: No significant change from baseline ECG  QPS Raw Data Images:  Normal; no motion artifact; normal heart/lung ratio. Stress Images:  Normal homogeneous uptake in all areas of the myocardium. Rest Images:  Normal homogeneous uptake in all areas of the myocardium. Subtraction (SDS):  No evidence of ischemia. Transient Ischemic Dilatation (Normal <1.22):  0.85 Lung/Heart Ratio (Normal <0.45):  0.28  Quantitative Gated Spect Images QGS EDV:  128 ml QGS ESV:  69 ml  Impression Exercise Capacity:  Lexiscan with no exercise. BP Response:  Normal blood pressure response. Clinical Symptoms:  No significant symptoms noted. ECG Impression:  No significant ST segment change suggestive of ischemia. Comparison with Prior Nuclear Study: No images to compare  Overall Impression:  Normal stress nuclear study.  No evidence of ischemia.   LV Ejection Fraction: 46%.  LV Wall Motion:  NL LV Function; NL Wall Motion.   Thayer Headings, Brooke Bonito., MD, [facility] 02/22/2014, 5:47 PM Office - [phone] Pager [phone]

## 2014-02-23 ENCOUNTER — Encounter: Payer: Self-pay | Admitting: Cardiology

## 2014-02-23 ENCOUNTER — Ambulatory Visit (INDEPENDENT_AMBULATORY_CARE_PROVIDER_SITE_OTHER): Payer: Medicaid Other | Admitting: Cardiology

## 2014-02-23 VITALS — BP 190/90 | HR 76 | Ht 69.0 in | Wt 174.0 lb

## 2014-02-23 DIAGNOSIS — Z720 Tobacco use: Secondary | ICD-10-CM

## 2014-02-23 DIAGNOSIS — R000 Tachycardia, unspecified: Secondary | ICD-10-CM

## 2014-02-23 DIAGNOSIS — I10 Essential (primary) hypertension: Secondary | ICD-10-CM

## 2014-02-23 DIAGNOSIS — F17200 Nicotine dependence, unspecified, uncomplicated: Secondary | ICD-10-CM

## 2014-02-23 DIAGNOSIS — I517 Cardiomegaly: Secondary | ICD-10-CM

## 2014-02-23 DIAGNOSIS — I119 Hypertensive heart disease without heart failure: Secondary | ICD-10-CM

## 2014-02-23 MED ORDER — LISINOPRIL 10 MG PO TABS: 20.0000 mg | ORAL_TABLET | Freq: Every day | ORAL | Status: DC

## 2014-02-23 MED ORDER — CARVEDILOL 25 MG PO TABS: 25.0000 mg | ORAL_TABLET | Freq: Two times a day (BID) | ORAL | Status: DC

## 2014-02-23 MED ORDER — AMLODIPINE BESYLATE 5 MG PO TABS: 5.0000 mg | ORAL_TABLET | Freq: Every day | ORAL | Status: DC

## 2014-02-23 NOTE — Patient Instructions (Signed)
Your physician has recommended you make the following change in your medication:   1. Increase Carvedilol to 25 mg twice daily. 2. Increase Lisinopril to 20 mg once daily. 3. Start Amlodipine 5 mg once daily.  Your physician wants you to follow-up in: 6 months with Dr. Marlou Porch. You will receive a reminder letter in the mail two months in advance. If you don't receive a letter, please call our office to schedule the follow-up appointment.

## 2014-02-23 NOTE — Progress Notes (Signed)
[facility]. [address], [address] [place], [place]  [address] Phone: [phone] Fax:  [phone]  Date:  02/23/2014   ID:  Edwin Love, DOB [birth year], MRN [id number]  PCP:  No primary provider on file.   History of Present Illness: Edwin Love is a 53 y.o. male here for evaluation of chest pain. Was in emergency department on 11/20/14 with complaint of chest pain and nosebleeds. He was having ongoing cold symptoms at the time. He is limited chest discomfort over the last several days, intermittent pressure same with activity and rest which sometimes is worsened by movement or lifting. He's had chronic shortness of breath with activity for years that is unchanged. These chest pains have been present in the past. Hypertension in the past. Not on any medications. He was told that his heart was enlarged at one point. Chest x-ray here does not show cardiomegaly.  Continues to smoke, no prior CAD history.  Creatinine 1.41, potassium 4.3, blood alcohol was normal.  EKG on 11/21/14 demonstrated sinus rhythm, LVH, nonspecific ST-T wave changes with generalized ST segment depression.  Has  Adult medicine center visit. He is being treated with depression medications which he brought with him.  I'm not convinced that he is taking his medications.    Wt Readings from Last 3 Encounters:  02/23/14 174 lb (78.926 kg)  02/22/14 176 lb (79.833 kg)  02/08/14 182 lb 15.7 oz (83 kg)     Past Medical History  Diagnosis Date  . Hypertension   . Back pain   . Renal disorder   . Anemia   . LVH (left ventricular hypertrophy)     Past Surgical History  Procedure Laterality Date  . Eye surgery Left 1990's    Foreign body  . Inguinal hernia repair Right 02/08/2014    Procedure: HERNIA REPAIR INGUINAL INCARCERATED;  Surgeon: Edward Jolly, MD;  Location: [facility];  Service: General;  Laterality: Right;  . Insertion of mesh Right 02/08/2014    Procedure: INSERTION OF MESH;   Surgeon: Edward Jolly, MD;  Location: [facility];  Service: General;  Laterality: Right;    Current Outpatient Prescriptions  Medication Sig Dispense Refill  . carvedilol (COREG) 12.5 MG tablet Take 1 tablet (12.5 mg total) by mouth 2 (two) times daily.  60 tablet  3  . citalopram (CELEXA) 20 MG tablet Take 20 mg by mouth daily.      . hydrOXYzine (ATARAX/VISTARIL) 50 MG tablet Take 50 mg by mouth daily at 10 pm.      . ibuprofen (ADVIL,MOTRIN) 200 MG tablet Take 400 mg by mouth every 6 (six) hours as needed for moderate pain.      Marland Kitchen lisinopril (PRINIVIL,ZESTRIL) 10 MG tablet Take 1 tablet (10 mg total) by mouth daily.  30 tablet  0  . oxyCODONE-acetaminophen (ROXICET) 5-325 MG per tablet Take 1-2 tablets by mouth every 6 (six) hours as needed for moderate pain or severe pain.  40 tablet  0  . paliperidone (INVEGA) 6 MG 24 hr tablet Take 6 mg by mouth daily.       No current facility-administered medications for this visit.    Allergies:   No Known Allergies  Social History:  The patient  reports that he has been smoking Cigarettes.  He has a 17 pack-year smoking history. He does not have any smokeless tobacco history on file. He reports that he drinks alcohol. He reports that he uses illicit drugs (  Marijuana and Cocaine).   Family History  Problem Relation Age of Onset  . Diabetes Brother   . Hypertension      family   mother had diabetes, sister with sickle cell anemia.  ROS:  Please see the history of present illness.   Denies any fevers, chills, orthopnea, PND, rash, syncope. Recent upper respiratory virus. Positive for chest pain.   All other systems reviewed and negative.   PHYSICAL EXAM: VS:  BP 190/90  Pulse 76  Ht 5\' 9"  (1.753 m)  Wt 174 lb (78.926 kg)  BMI 25.68 kg/m2 Well nourished, well developed, in no acute distressWearing sunglasses. HEENT: normal, [place]/AT, EOMI, normal-appearing tongue Neck: no JVD, normal carotid upstroke, no bruit Cardiac:  normal S1, S2;   regular; no murmurNo significant JVD Lungs:  clear to auscultation bilaterally, no wheezing, rhonchi or rales Abd: soft, nontender, no hepatomegaly, no bruitsMildly protuberant Ext: no edema, 2+ distal pulses Skin: warm and dry GU: deferred Neuro: no focal abnormalities noted, AAO x 3  EKG:  11/21/14 demonstrated sinus rhythm, LVH, nonspecific ST-T wave changes with generalized ST segment depression.    Chest x-ray 01/21/14-normal, no evidence of cardiomegaly. Personally viewed.  Labs: 01/21/14- BNP 350, creatinine 1.41, potassium 4.3, troponin normal, hemoglobin 10.3  Nuclear stress test 02/23/14: Low risk, no ischemia, EF 46%  Echocardiogram 02/16/14-left ventricular hypertrophy, severe, normal ejection fraction, grade 2 diastolic dysfunction, moderately dilated left atrium.  ASSESSMENT AND PLAN:  1. Chest pain-intermittent, chronic. Abnormal EKG with nonspecific ST changes. Nuclear stress test overall was reassuring with no evidence of ischemia.  2. Dyspnea/tachycardia-echocardiogram demonstrates left ventricular hypertrophy, quite significant. He was told previously that he had cardiomegaly. With his uncontrolled blood pressure, this is the most likely culprit for his left ventricular hypertrophy. Because of this, I will increase his carvedilol to 25 mg twice a day. His tachycardia is improved. Also increase his lisinopril to 20 mg a day. His blood pressure originally was 162/110 and he should be able to tolerate this dosing. BNP is slightly elevated. 3. Chronic kidney disease stage III-creatinine 1.41 at baseline.  2 weeks after initiation of ACE inhibitor. He also has an appointment with adult internal medicine. I strongly encouraged him to continue with this appointment. 4. Tachycardia-as above. Improved 5. Tobacco use-tobacco cessation counseling. 6. Hypertension-as above. For cost reasons, he would like to work with his PCP on this. 7. He states that he would like records for his  disability lawyer. This is fine. I requested that the lawyers send [place] a formal request. 8. Checking basic metabolic profile and TSH. Will check SPEP  as well (low albumin normal total protein). Thrombocytopenia, platelets 65,000. Recheck CBC. Definitely has the possibility of underlying hepatic disease. Continue workup with primary physician. With LVH, consider amyloidosis however hypertension most likely reason. However classic EKG finding is low voltage with severe LVH.  Signed, Candee Furbish, MD [facility]  02/23/2014 9:29 AM

## 2014-03-04 ENCOUNTER — Encounter (INDEPENDENT_AMBULATORY_CARE_PROVIDER_SITE_OTHER): Payer: Self-pay | Admitting: General Surgery

## 2014-03-16 ENCOUNTER — Telehealth (INDEPENDENT_AMBULATORY_CARE_PROVIDER_SITE_OTHER): Payer: Self-pay

## 2014-03-16 NOTE — Telephone Encounter (Signed)
Called and left message for patient regarding his appointment being rescheduled to 03/18/14 @ 10:10 am w/Dr. Excell Seltzer.  Father will give message to patient to make aware.

## 2014-03-17 ENCOUNTER — Encounter (INDEPENDENT_AMBULATORY_CARE_PROVIDER_SITE_OTHER): Payer: Self-pay | Admitting: General Surgery

## 2014-03-18 ENCOUNTER — Encounter (INDEPENDENT_AMBULATORY_CARE_PROVIDER_SITE_OTHER): Payer: Self-pay | Admitting: General Surgery

## 2014-03-18 ENCOUNTER — Ambulatory Visit (INDEPENDENT_AMBULATORY_CARE_PROVIDER_SITE_OTHER): Payer: Medicaid Other | Admitting: General Surgery

## 2014-03-18 VITALS — BP 190/120 | HR 80 | Temp 98.2°F | Resp 14 | Ht 68.0 in | Wt 179.8 lb

## 2014-03-18 DIAGNOSIS — Z09 Encounter for follow-up examination after completed treatment for conditions other than malignant neoplasm: Secondary | ICD-10-CM

## 2014-03-18 NOTE — Patient Instructions (Signed)
You have a small hernia at the umbilicus (bellybutton) that likely will need to be repaired. We will discuss this at your next visit.

## 2014-03-18 NOTE — Progress Notes (Signed)
Chief complaint: Followup hernia repair  History: Patient returns 4 weeks following emergency repair of an incarcerated right inguinal hernia with small bowel obstruction. He still has some soreness at the incision particularly after helping his father lived to table a week ago but no major complaints. No GI complaints.  He does however notice a lump in the midline abdomen just above his umbilicus.  Exam: BP 190/120  Pulse 80  Temp(Src) 98.2 F (36.8 C) (Temporal)  Resp 14  Ht 5\' 8"  (1.478 m)  Wt 295 lb 12.8 oz (81.557 kg)  BMI 27.34 kg/m2 General: No distress Abdomen: Right inguinal incision is well-healed. No evidence of recurrence or complication there is however a several centimeter reducible tender hernia presenting just above the umbilicus.  Assessment and plan: Status post emergency right inguinal hernia repair, doing well without complication. He does however have a somewhat symptomatic epigastric hernia. I think this likely will need to be repaired. We will give him a little more time to get over his recent surgery. I will see him back in 6 weeks to discuss this further and recheck.

## 2014-04-29 ENCOUNTER — Encounter (INDEPENDENT_AMBULATORY_CARE_PROVIDER_SITE_OTHER): Payer: Self-pay

## 2014-04-29 ENCOUNTER — Encounter (INDEPENDENT_AMBULATORY_CARE_PROVIDER_SITE_OTHER): Payer: Self-pay | Admitting: General Surgery

## 2014-04-29 ENCOUNTER — Ambulatory Visit (INDEPENDENT_AMBULATORY_CARE_PROVIDER_SITE_OTHER): Payer: Medicaid Other | Admitting: General Surgery

## 2014-04-29 VITALS — BP 158/94 | HR 80 | Temp 97.4°F | Resp 16 | Ht 69.0 in | Wt 181.0 lb

## 2014-04-29 DIAGNOSIS — K439 Ventral hernia without obstruction or gangrene: Secondary | ICD-10-CM

## 2014-04-29 NOTE — Progress Notes (Signed)
Chief complaint: Hernia abdominal wall in followup inguinal hernia  History: Patient returns to the office with almost 3 months following emergency repair of an incarcerated right inguinal hernia with small bowel obstruction.  He generally has been doing well from this with just some gradually decreasing occasional discomfort. He however has an intermittent bulge just above his umbilicus that is somewhat tender at times without any significant pain or vomiting. This been present for quite a while. He was actually noted at his admission 3 months ago but due to the emergency nature of the surgery and bowel obstruction we deferred repair at that time.  Past Medical History  Diagnosis Date  . Hypertension   . Back pain   . Renal disorder   . Anemia   . LVH (left ventricular hypertrophy)    Past Surgical History  Procedure Laterality Date  . Eye surgery Left 1990's    Foreign body  . Inguinal hernia repair Right 02/08/2014    Procedure: HERNIA REPAIR INGUINAL INCARCERATED;  Surgeon: Edward Jolly, MD;  Location: [facility];  Service: General;  Laterality: Right;  . Insertion of mesh Right 02/08/2014    Procedure: INSERTION OF MESH;  Surgeon: Edward Jolly, MD;  Location: [facility];  Service: General;  Laterality: Right;  . Hernia repair     Current Outpatient Prescriptions  Medication Sig Dispense Refill  . amLODipine (NORVASC) 5 MG tablet Take 1 tablet (5 mg total) by mouth daily.  30 tablet  3  . carvedilol (COREG) 25 MG tablet Take 1 tablet (25 mg total) by mouth 2 (two) times daily.  60 tablet  3  . citalopram (CELEXA) 20 MG tablet Take 20 mg by mouth daily.      . hydrOXYzine (ATARAX/VISTARIL) 50 MG tablet Take 50 mg by mouth daily at 10 pm.      . ibuprofen (ADVIL,MOTRIN) 200 MG tablet Take 400 mg by mouth every 6 (six) hours as needed for moderate pain.      Marland Kitchen lisinopril (PRINIVIL,ZESTRIL) 10 MG tablet Take 2 tablets (20 mg total) by mouth daily.  60 tablet  3  . paliperidone (INVEGA)  6 MG 24 hr tablet Take 6 mg by mouth daily.       No current facility-administered medications for this visit.   No Known Allergies History  Substance Use Topics  . Smoking status: Current Every Day Smoker -- 0.50 packs/day for 34 years    Types: Cigarettes  . Smokeless tobacco: Not on file     Comment: Smoke since 53 years old  . Alcohol Use: Yes     Comment: 1-2 40oz daily  review of systems: General: No fever chills malaise Respiratory: Some shortness of breath on exertion. No cough or wheezing Cardiac: Denies chest pain palpitations GI: Denies nausea vomiting or change in bowel habits  Exam: BP 158/94  Pulse 80  Temp(Src) 97.4 F (36.3 C) (Temporal)  Resp 16  Ht 5\' 9"  (1.753 m)  Wt 181 lb (82.101 kg)  BMI 26.72 kg/m2 General: Alert slightly chronically ill-appearing Afro-American male in no distress Skin: No rash infection Lungs: Clear breath sounds bilaterally without increased work of breathing Cardiac: Regular rate and rhythm. No edema. Abdomen: Mildly obese. Right inguinal repair is solid with mild tenderness. There is a moderate-sized hernia palpable in the midline just above the umbilicus which is slightly tender and reducible with slight difficulty and appears to be coming through about a 2 cm defect. Extremities: No edema Neurologic: alert and oriented.  Gait normal.  Assessment and plan: Symptomatic supraumbilical hernia. This is tender and I believe at risk for incarceration and therefore should be repaired. We discussed the nature of the surgery and risks of anesthesia, bleeding, infection and recurrence. He recently saw his cardiologist and appeared stable but we will obtain cardiac clearance. He states he has not been taking his blood pressure medicines as he is out of him and I stressed to him the extreme importance of being on his blood pressure medication regularly to avoid heart and kidney damage stroke and other problems. Plan repair of epigastric hernia with  mesh under general anesthesia as an outpatient following cardiac clearance.

## 2014-04-30 ENCOUNTER — Encounter: Payer: Self-pay | Admitting: Cardiology

## 2014-06-09 DIAGNOSIS — D696 Thrombocytopenia, unspecified: Secondary | ICD-10-CM

## 2014-06-09 DIAGNOSIS — D689 Coagulation defect, unspecified: Secondary | ICD-10-CM

## 2014-06-09 HISTORY — DX: Coagulation defect, unspecified: D68.9

## 2014-06-09 HISTORY — DX: Thrombocytopenia, unspecified: D69.6

## 2014-06-24 ENCOUNTER — Emergency Department (HOSPITAL_COMMUNITY): Payer: Medicaid Other | Admitting: Anesthesiology

## 2014-06-24 ENCOUNTER — Encounter (HOSPITAL_COMMUNITY)
Admission: EM | Disposition: A | Discharge status: Rehabilitation Facility | Payer: Self-pay | Source: Home / Self Care | Attending: Family Medicine

## 2014-06-24 ENCOUNTER — Emergency Department (HOSPITAL_COMMUNITY): Payer: Medicaid Other

## 2014-06-24 ENCOUNTER — Encounter (HOSPITAL_COMMUNITY): Payer: Medicaid Other | Admitting: Anesthesiology

## 2014-06-24 ENCOUNTER — Inpatient Hospital Stay (HOSPITAL_COMMUNITY): Payer: Medicaid Other

## 2014-06-24 ENCOUNTER — Inpatient Hospital Stay (HOSPITAL_COMMUNITY)
Admission: EM | Admit: 2014-06-24 | Discharge: 2014-07-05 | DRG: 326 | Disposition: A | Discharge status: Rehabilitation Facility | Payer: Medicaid Other | Attending: Family Medicine | Admitting: Family Medicine

## 2014-06-24 ENCOUNTER — Encounter (HOSPITAL_COMMUNITY): Payer: Self-pay | Admitting: Emergency Medicine

## 2014-06-24 DIAGNOSIS — K7030 Alcoholic cirrhosis of liver without ascites: Secondary | ICD-10-CM | POA: Diagnosis present

## 2014-06-24 DIAGNOSIS — Z5189 Encounter for other specified aftercare: Secondary | ICD-10-CM | POA: Diagnosis not present

## 2014-06-24 DIAGNOSIS — I469 Cardiac arrest, cause unspecified: Secondary | ICD-10-CM | POA: Diagnosis not present

## 2014-06-24 DIAGNOSIS — E874 Mixed disorder of acid-base balance: Secondary | ICD-10-CM | POA: Diagnosis not present

## 2014-06-24 DIAGNOSIS — F1020 Alcohol dependence, uncomplicated: Secondary | ICD-10-CM | POA: Diagnosis present

## 2014-06-24 DIAGNOSIS — K7682 Hepatic encephalopathy: Secondary | ICD-10-CM | POA: Diagnosis present

## 2014-06-24 DIAGNOSIS — Z66 Do not resuscitate: Secondary | ICD-10-CM | POA: Diagnosis not present

## 2014-06-24 DIAGNOSIS — R578 Other shock: Secondary | ICD-10-CM

## 2014-06-24 DIAGNOSIS — E875 Hyperkalemia: Secondary | ICD-10-CM | POA: Diagnosis not present

## 2014-06-24 DIAGNOSIS — Z720 Tobacco use: Secondary | ICD-10-CM

## 2014-06-24 DIAGNOSIS — E872 Acidosis, unspecified: Secondary | ICD-10-CM | POA: Diagnosis not present

## 2014-06-24 DIAGNOSIS — D6959 Other secondary thrombocytopenia: Secondary | ICD-10-CM | POA: Diagnosis present

## 2014-06-24 DIAGNOSIS — E876 Hypokalemia: Secondary | ICD-10-CM | POA: Diagnosis not present

## 2014-06-24 DIAGNOSIS — J9601 Acute respiratory failure with hypoxia: Secondary | ICD-10-CM

## 2014-06-24 DIAGNOSIS — D62 Acute posthemorrhagic anemia: Secondary | ICD-10-CM | POA: Diagnosis present

## 2014-06-24 DIAGNOSIS — D696 Thrombocytopenia, unspecified: Secondary | ICD-10-CM | POA: Diagnosis present

## 2014-06-24 DIAGNOSIS — K648 Other hemorrhoids: Secondary | ICD-10-CM | POA: Diagnosis present

## 2014-06-24 DIAGNOSIS — Z833 Family history of diabetes mellitus: Secondary | ICD-10-CM | POA: Diagnosis not present

## 2014-06-24 DIAGNOSIS — F10239 Alcohol dependence with withdrawal, unspecified: Secondary | ICD-10-CM | POA: Diagnosis present

## 2014-06-24 DIAGNOSIS — I8510 Secondary esophageal varices without bleeding: Secondary | ICD-10-CM | POA: Diagnosis present

## 2014-06-24 DIAGNOSIS — J9600 Acute respiratory failure, unspecified whether with hypoxia or hypercapnia: Secondary | ICD-10-CM

## 2014-06-24 DIAGNOSIS — F3289 Other specified depressive episodes: Secondary | ICD-10-CM | POA: Diagnosis present

## 2014-06-24 DIAGNOSIS — C228 Malignant neoplasm of liver, primary, unspecified as to type: Secondary | ICD-10-CM | POA: Diagnosis present

## 2014-06-24 DIAGNOSIS — E8809 Other disorders of plasma-protein metabolism, not elsewhere classified: Secondary | ICD-10-CM | POA: Diagnosis present

## 2014-06-24 DIAGNOSIS — H5440 Blindness, one eye, unspecified eye: Secondary | ICD-10-CM | POA: Diagnosis present

## 2014-06-24 DIAGNOSIS — R031 Nonspecific low blood-pressure reading: Secondary | ICD-10-CM | POA: Diagnosis present

## 2014-06-24 DIAGNOSIS — I509 Heart failure, unspecified: Secondary | ICD-10-CM | POA: Diagnosis present

## 2014-06-24 DIAGNOSIS — K922 Gastrointestinal hemorrhage, unspecified: Secondary | ICD-10-CM

## 2014-06-24 DIAGNOSIS — J189 Pneumonia, unspecified organism: Secondary | ICD-10-CM | POA: Diagnosis not present

## 2014-06-24 DIAGNOSIS — R000 Tachycardia, unspecified: Secondary | ICD-10-CM | POA: Diagnosis present

## 2014-06-24 DIAGNOSIS — I129 Hypertensive chronic kidney disease with stage 1 through stage 4 chronic kidney disease, or unspecified chronic kidney disease: Secondary | ICD-10-CM | POA: Diagnosis present

## 2014-06-24 DIAGNOSIS — J9819 Other pulmonary collapse: Secondary | ICD-10-CM | POA: Diagnosis not present

## 2014-06-24 DIAGNOSIS — J9584 Transfusion-related acute lung injury (TRALI): Secondary | ICD-10-CM | POA: Diagnosis present

## 2014-06-24 DIAGNOSIS — K625 Hemorrhage of anus and rectum: Secondary | ICD-10-CM | POA: Diagnosis present

## 2014-06-24 DIAGNOSIS — Z23 Encounter for immunization: Secondary | ICD-10-CM

## 2014-06-24 DIAGNOSIS — R0681 Apnea, not elsewhere classified: Secondary | ICD-10-CM | POA: Diagnosis not present

## 2014-06-24 DIAGNOSIS — N183 Chronic kidney disease, stage 3 unspecified: Secondary | ICD-10-CM | POA: Diagnosis present

## 2014-06-24 DIAGNOSIS — F329 Major depressive disorder, single episode, unspecified: Secondary | ICD-10-CM | POA: Diagnosis present

## 2014-06-24 DIAGNOSIS — F1210 Cannabis abuse, uncomplicated: Secondary | ICD-10-CM | POA: Diagnosis present

## 2014-06-24 DIAGNOSIS — K429 Umbilical hernia without obstruction or gangrene: Secondary | ICD-10-CM | POA: Diagnosis present

## 2014-06-24 DIAGNOSIS — Z9911 Dependence on respirator [ventilator] status: Secondary | ICD-10-CM

## 2014-06-24 DIAGNOSIS — K7010 Alcoholic hepatitis without ascites: Secondary | ICD-10-CM | POA: Diagnosis present

## 2014-06-24 DIAGNOSIS — T458X5A Adverse effect of other primarily systemic and hematological agents, initial encounter: Secondary | ICD-10-CM | POA: Diagnosis present

## 2014-06-24 DIAGNOSIS — K7031 Alcoholic cirrhosis of liver with ascites: Secondary | ICD-10-CM

## 2014-06-24 DIAGNOSIS — K5730 Diverticulosis of large intestine without perforation or abscess without bleeding: Secondary | ICD-10-CM | POA: Diagnosis present

## 2014-06-24 DIAGNOSIS — G8929 Other chronic pain: Secondary | ICD-10-CM | POA: Diagnosis present

## 2014-06-24 DIAGNOSIS — K649 Unspecified hemorrhoids: Secondary | ICD-10-CM | POA: Diagnosis present

## 2014-06-24 DIAGNOSIS — K7290 Hepatic failure, unspecified without coma: Secondary | ICD-10-CM | POA: Diagnosis present

## 2014-06-24 DIAGNOSIS — R609 Edema, unspecified: Secondary | ICD-10-CM | POA: Diagnosis present

## 2014-06-24 DIAGNOSIS — K766 Portal hypertension: Secondary | ICD-10-CM | POA: Diagnosis present

## 2014-06-24 DIAGNOSIS — M549 Dorsalgia, unspecified: Secondary | ICD-10-CM | POA: Diagnosis present

## 2014-06-24 DIAGNOSIS — Z832 Family history of diseases of the blood and blood-forming organs and certain disorders involving the immune mechanism: Secondary | ICD-10-CM | POA: Diagnosis not present

## 2014-06-24 DIAGNOSIS — R599 Enlarged lymph nodes, unspecified: Secondary | ICD-10-CM | POA: Diagnosis present

## 2014-06-24 DIAGNOSIS — K921 Melena: Secondary | ICD-10-CM

## 2014-06-24 DIAGNOSIS — K7460 Unspecified cirrhosis of liver: Secondary | ICD-10-CM

## 2014-06-24 DIAGNOSIS — I864 Gastric varices: Secondary | ICD-10-CM

## 2014-06-24 DIAGNOSIS — F10939 Alcohol use, unspecified with withdrawal, unspecified: Secondary | ICD-10-CM | POA: Diagnosis present

## 2014-06-24 DIAGNOSIS — R188 Other ascites: Secondary | ICD-10-CM | POA: Diagnosis present

## 2014-06-24 DIAGNOSIS — G9340 Encephalopathy, unspecified: Secondary | ICD-10-CM

## 2014-06-24 DIAGNOSIS — F17200 Nicotine dependence, unspecified, uncomplicated: Secondary | ICD-10-CM | POA: Diagnosis present

## 2014-06-24 DIAGNOSIS — F1910 Other psychoactive substance abuse, uncomplicated: Secondary | ICD-10-CM | POA: Diagnosis present

## 2014-06-24 DIAGNOSIS — I5030 Unspecified diastolic (congestive) heart failure: Secondary | ICD-10-CM | POA: Diagnosis present

## 2014-06-24 DIAGNOSIS — I81 Portal vein thrombosis: Secondary | ICD-10-CM | POA: Diagnosis present

## 2014-06-24 DIAGNOSIS — I959 Hypotension, unspecified: Secondary | ICD-10-CM | POA: Diagnosis not present

## 2014-06-24 DIAGNOSIS — I8500 Esophageal varices without bleeding: Secondary | ICD-10-CM

## 2014-06-24 DIAGNOSIS — N179 Acute kidney failure, unspecified: Secondary | ICD-10-CM

## 2014-06-24 HISTORY — DX: Thrombocytopenia, unspecified: D69.6

## 2014-06-24 HISTORY — DX: Coagulation defect, unspecified: D68.9

## 2014-06-24 HISTORY — PX: ESOPHAGOGASTRODUODENOSCOPY: SHX5428

## 2014-06-24 HISTORY — DX: Other fracture of left lower leg, initial encounter for closed fracture: S82.892A

## 2014-06-24 LAB — COMPREHENSIVE METABOLIC PANEL
ALT: 20 U/L (ref 0–53)
ANION GAP: 21 — AB (ref 5–15)
AST: 71 U/L — ABNORMAL HIGH (ref 0–37)
Albumin: 1.4 g/dL — ABNORMAL LOW (ref 3.5–5.2)
Alkaline Phosphatase: 85 U/L (ref 39–117)
BUN: 15 mg/dL (ref 6–23)
CALCIUM: 7 mg/dL — AB (ref 8.4–10.5)
CO2: 11 mEq/L — ABNORMAL LOW (ref 19–32)
CREATININE: 1.61 mg/dL — AB (ref 0.50–1.35)
Chloride: 110 mEq/L (ref 96–112)
GFR calc Af Amer: 55 mL/min — ABNORMAL LOW (ref >90)
GFR calc non Af Amer: 47 mL/min — ABNORMAL LOW (ref >90)
GLUCOSE: 130 mg/dL — AB (ref 70–99)
Potassium: 4.3 mEq/L (ref 3.7–5.3)
SODIUM: 142 meq/L (ref 137–147)
TOTAL PROTEIN: 4.7 g/dL — AB (ref 6.0–8.3)
Total Bilirubin: 0.4 mg/dL (ref 0.3–1.2)

## 2014-06-24 LAB — AMMONIA: Ammonia: 35 umol/L (ref 11–60)

## 2014-06-24 LAB — CBC WITH DIFFERENTIAL/PLATELET
BASOS ABS: 0.1 10*3/uL (ref 0.0–0.1)
BASOS PCT: 1 % (ref 0–1)
Eosinophils Absolute: 0.1 10*3/uL (ref 0.0–0.7)
Eosinophils Relative: 1 % (ref 0–5)
HEMATOCRIT: 18.2 % — AB (ref 39.0–52.0)
Hemoglobin: 6.2 g/dL — CL (ref 13.0–17.0)
Lymphocytes Relative: 17 % (ref 12–46)
Lymphs Abs: 1.4 10*3/uL (ref 0.7–4.0)
MCH: 27.4 pg (ref 26.0–34.0)
MCHC: 34.1 g/dL (ref 30.0–36.0)
MCV: 80.5 fL (ref 78.0–100.0)
MONO ABS: 0.7 10*3/uL (ref 0.1–1.0)
Monocytes Relative: 8 % (ref 3–12)
NEUTROS ABS: 6.2 10*3/uL (ref 1.7–7.7)
Neutrophils Relative %: 73 % (ref 43–77)
Platelets: 94 10*3/uL — ABNORMAL LOW (ref 150–400)
RBC: 2.26 MIL/uL — ABNORMAL LOW (ref 4.22–5.81)
RDW: 20.7 % — AB (ref 11.5–15.5)
WBC: 8.5 10*3/uL (ref 4.0–10.5)

## 2014-06-24 LAB — I-STAT CHEM 8, ED
BUN: 14 mg/dL (ref 6–23)
CHLORIDE: 114 meq/L — AB (ref 96–112)
Calcium, Ion: 1.03 mmol/L — ABNORMAL LOW (ref 1.12–1.23)
Creatinine, Ser: 1.9 mg/dL — ABNORMAL HIGH (ref 0.50–1.35)
Glucose, Bld: 124 mg/dL — ABNORMAL HIGH (ref 70–99)
HEMATOCRIT: 20 % — AB (ref 39.0–52.0)
HEMOGLOBIN: 6.8 g/dL — AB (ref 13.0–17.0)
POTASSIUM: 4.1 meq/L (ref 3.7–5.3)
SODIUM: 143 meq/L (ref 137–147)
TCO2: 11 mmol/L (ref 0–100)

## 2014-06-24 LAB — PROTIME-INR
INR: 1.87 — AB (ref 0.00–1.49)
Prothrombin Time: 21.5 seconds — ABNORMAL HIGH (ref 11.6–15.2)

## 2014-06-24 LAB — ETHANOL: Alcohol, Ethyl (B): 131 mg/dL — ABNORMAL HIGH (ref 0–11)

## 2014-06-24 LAB — ABO/RH: ABO/RH(D): B POS

## 2014-06-24 LAB — APTT: APTT: 33 s (ref 24–37)

## 2014-06-24 LAB — HEPATITIS B SURFACE ANTIGEN: HEP B S AG: NEGATIVE

## 2014-06-24 LAB — PREPARE RBC (CROSSMATCH)

## 2014-06-24 LAB — I-STAT TROPONIN, ED: Troponin i, poc: 0.04 ng/mL (ref 0.00–0.08)

## 2014-06-24 LAB — MRSA PCR SCREENING: MRSA by PCR: NEGATIVE

## 2014-06-24 LAB — HEPATITIS B SURFACE ANTIBODY,QUALITATIVE: Hep B S Ab: NEGATIVE

## 2014-06-24 LAB — LIPASE, BLOOD: Lipase: 78 U/L — ABNORMAL HIGH (ref 11–59)

## 2014-06-24 LAB — AFP TUMOR MARKER: AFP TUMOR MARKER: 2.8 ng/mL (ref 0.0–8.0)

## 2014-06-24 LAB — HEPATITIS C ANTIBODY: HCV AB: REACTIVE — AB

## 2014-06-24 LAB — GLUCOSE, CAPILLARY: Glucose-Capillary: 130 mg/dL — ABNORMAL HIGH (ref 70–99)

## 2014-06-24 SURGERY — EGD (ESOPHAGOGASTRODUODENOSCOPY)
Anesthesia: Monitor Anesthesia Care

## 2014-06-24 SURGERY — ESOPHAGOGASTRODUODENOSCOPY (EGD) WITH PROPOFOL
Anesthesia: Monitor Anesthesia Care

## 2014-06-24 SURGERY — EGD (ESOPHAGOGASTRODUODENOSCOPY)
Anesthesia: Moderate Sedation

## 2014-06-24 MED ORDER — FOLIC ACID 5 MG/ML IJ SOLN
1.0000 mg | Freq: Every day | INTRAMUSCULAR | Status: DC
Start: 2014-06-24 — End: 2014-06-29
  Administered 2014-06-25 – 2014-06-28 (×5): 1 mg via INTRAVENOUS
  Filled 2014-06-24 (×6): qty 0.2

## 2014-06-24 MED ORDER — SODIUM CHLORIDE 0.9 % IV SOLN
1000.0000 mL | Freq: Once | INTRAVENOUS | Status: AC
  Administered 2014-06-24: 1000 mL via INTRAVENOUS

## 2014-06-24 MED ORDER — SODIUM CHLORIDE 0.9 % IV SOLN: 1000.0000 mL | INTRAVENOUS | Status: DC

## 2014-06-24 MED ORDER — FENTANYL CITRATE 0.05 MG/ML IJ SOLN
INTRAMUSCULAR | Status: DC | PRN
  Administered 2014-06-24: 50 ug via INTRAVENOUS

## 2014-06-24 MED ORDER — LORAZEPAM 2 MG/ML IJ SOLN
1.0000 mg | INTRAMUSCULAR | Status: DC | PRN
  Administered 2014-06-25: 1 mg via INTRAVENOUS
  Filled 2014-06-24: qty 2

## 2014-06-24 MED ORDER — PANTOPRAZOLE SODIUM 40 MG IV SOLR
40.0000 mg | INTRAVENOUS | Status: DC
  Administered 2014-06-24 – 2014-06-28 (×5): 40 mg via INTRAVENOUS
  Filled 2014-06-24 (×6): qty 40

## 2014-06-24 MED ORDER — SODIUM CHLORIDE 0.9 % IV SOLN
INTRAVENOUS | Status: DC
  Administered 2014-06-24 (×2): via INTRAVENOUS

## 2014-06-24 MED ORDER — MIDAZOLAM HCL 5 MG/ML IJ SOLN
INTRAMUSCULAR | Status: AC
  Filled 2014-06-24: qty 2

## 2014-06-24 MED ORDER — SODIUM CHLORIDE 0.9 % IV SOLN
250.0000 mL | INTRAVENOUS | Status: DC | PRN
  Administered 2014-06-25: 16:00:00 via INTRAVENOUS

## 2014-06-24 MED ORDER — FENTANYL CITRATE 0.05 MG/ML IJ SOLN
INTRAMUSCULAR | Status: AC
  Filled 2014-06-24: qty 4

## 2014-06-24 MED ORDER — VITAMIN K1 10 MG/ML IJ SOLN
10.0000 mg | Freq: Every day | INTRAMUSCULAR | Status: DC
  Administered 2014-06-25: 10 mg via SUBCUTANEOUS
  Filled 2014-06-24 (×2): qty 1

## 2014-06-24 MED ORDER — SODIUM CHLORIDE 0.9 % IV SOLN: 1000.0000 mL | Freq: Once | INTRAVENOUS | Status: DC

## 2014-06-24 MED ORDER — ONDANSETRON HCL 4 MG/2ML IJ SOLN
4.0000 mg | Freq: Once | INTRAMUSCULAR | Status: AC
  Administered 2014-06-24: 4 mg via INTRAVENOUS

## 2014-06-24 MED ORDER — THIAMINE HCL 100 MG/ML IJ SOLN
100.0000 mg | Freq: Every day | INTRAMUSCULAR | Status: DC
  Administered 2014-06-25 – 2014-06-28 (×5): 100 mg via INTRAVENOUS
  Filled 2014-06-24 (×6): qty 1

## 2014-06-24 MED ORDER — PROPOFOL 10 MG/ML IV BOLUS
INTRAVENOUS | Status: DC | PRN
  Administered 2014-06-24: 50 mg via INTRAVENOUS

## 2014-06-24 MED ORDER — TECHNETIUM TC 99M-LABELED RED BLOOD CELLS IV KIT
25.0000 | PACK | Freq: Once | INTRAVENOUS | Status: AC | PRN
  Administered 2014-06-24: 25 via INTRAVENOUS

## 2014-06-24 MED ORDER — ONDANSETRON HCL 4 MG/2ML IJ SOLN
INTRAMUSCULAR | Status: AC
  Filled 2014-06-24: qty 2

## 2014-06-24 MED ORDER — IOHEXOL 300 MG/ML  SOLN: 25.0000 mL | INTRAMUSCULAR | Status: DC

## 2014-06-24 MED ORDER — IOHEXOL 300 MG/ML  SOLN
80.0000 mL | Freq: Once | INTRAMUSCULAR | Status: AC | PRN
  Administered 2014-06-24: 80 mL via INTRAVENOUS

## 2014-06-24 MED ORDER — DIPHENHYDRAMINE HCL 50 MG/ML IJ SOLN
INTRAMUSCULAR | Status: AC
  Filled 2014-06-24: qty 1

## 2014-06-24 MED ORDER — PROPOFOL INFUSION 10 MG/ML OPTIME
INTRAVENOUS | Status: DC | PRN
  Administered 2014-06-24: 75 ug/kg/min via INTRAVENOUS

## 2014-06-24 MED ORDER — SODIUM CHLORIDE 0.9 % IV SOLN
INTRAVENOUS | Status: DC
  Administered 2014-06-24: 22:00:00 via INTRAVENOUS

## 2014-06-24 MED ORDER — MIDAZOLAM HCL 5 MG/5ML IJ SOLN
INTRAMUSCULAR | Status: DC | PRN
  Administered 2014-06-24: 2 mg via INTRAVENOUS

## 2014-06-24 NOTE — ED Notes (Signed)
Pt laying on bed with bright red blood around him on floor and bed. Family found him. unknown where began. ETOH smell per EMS. BP 60/30 per PTAR on seen. Palpated 68 BP. HR 80 SR. No N/V. Hx of similar episode several years ago. Right upper/lower quadrant pain. Pt is alert- moaning.

## 2014-06-24 NOTE — Consult Note (Signed)
[facility] Consult: 1:22 PM 06/24/2014  LOS: 0 days    Referring Provider: Dr Tomi Bamberger of ED Primary Care Physician:  No primary provider on file. Dr Marlou Porch is the cardiologist.  Primary Gastroenterologist:  unassigned     Reason for Consultation:  Hematochezia.    HPI: Edwin Love is a 53 y.o. male. Stage 3 CKD, chronic back pain.  S/p 02/08/14 repair of incarcerated right inguinal hernia with mesh. Had left knee effusion that was aspirated and injected with marcaine/depomedrol and looked to be gout, though fluid discarded before it could be sent to lab.  At surgical follow up 03/18/14, Dr Excell Seltzer says doing well but pt likely to need supraumbilical hernia repair in future. Drinks significant ETOH (admits to 80 oz malt liquor daily) with level of 146 in 02/2014 and 131 today.   Hgb 10 to 11.9 in March 2015. MCV was low at 72 in 01/2014.  Platelets as low as 65 on 02/09/14.  Were normal in 01/2014.  Was FOBT negative 01/21/14  In ED today with painless large volume hematochezia and Hgb down to 6.2, MCV 80. Platelets pending. The first of 2 episodes started 8 AM today.  No abd pain, no nausea, no significant heartburn.  No hx PUD or GI bleed.  No previous transfusions or iron therapy. No increased abdominal girth.  No blood in urine.  Sees small amount of blood when blowing his nose.  No "free bleeding" or unusual bruising. Drinks 2 "forties" of malt liquor daily.  Used to take Ibuprofen 200 mg daily, several days a week; none for last 2 weeks.  No syncope but was groggy, confused on initial arrival.  This has improved as BP and O2 sats improved.  Coags 21.5 and 1.8, previously normal. AST in 70s and ALT/Alk phos/T bili normal.  All LFTs previously normal.  Lipase is 78 (rr 11-59).  Albumin is low.  Both  hypotension (81/40) and low oxygen sats (82%) improved with fluid bolus.  No tachycardia.   CT scan in 02/2014 showed small volume ascites around liver, in GB fossa, in RLQ.  The liver was normal.   CT repeated today shows cirrhosis, liver nodules, partial PV thrombosis and umbilical hernia.      Past Medical History  Diagnosis Date  . Hypertension   . Back pain   . Renal disorder     CKD stage 3 noted in 02/2014 [facility] & [facility]  . Anemia 01/2014  . LVH (left ventricular hypertrophy) 02/2014    grade 2 diastolic dysfunction.   . Ankle fracture, left 10/2008    non surgical mgt.   . Thrombocytopenia 06/2014  . Coagulopathy 06/2014    Past Surgical History  Procedure Laterality Date  . Eye surgery Left 1990's    Foreign body  . Inguinal hernia repair Right 02/08/2014    Procedure: HERNIA REPAIR INGUINAL INCARCERATED;  Surgeon: Edward Jolly, MD;  Location: [facility];  Service: General;  Laterality: Right;  . Insertion of mesh Right 02/08/2014    Procedure: INSERTION OF MESH;  Surgeon:  Edward Jolly, MD;  Location: [facility];  Service: General;  Laterality: Right;  . Hernia repair      Prior to Admission medications   Medication Sig Start Date End Date Taking? Authorizing Provider  amLODipine (NORVASC) 5 MG tablet Take 1 tablet (5 mg total) by mouth daily. 02/23/14   Candee Furbish, MD  carvedilol (COREG) 25 MG tablet Take 1 tablet (25 mg total) by mouth 2 (two) times daily. 02/23/14   Candee Furbish, MD  citalopram (CELEXA) 20 MG tablet Take 20 mg by mouth daily.    Historical Provider, MD  hydrOXYzine (ATARAX/VISTARIL) 50 MG tablet Take 50 mg by mouth daily at 10 pm.    Historical Provider, MD  ibuprofen (ADVIL,MOTRIN) 200 MG tablet Take 400 mg by mouth every 6 (six) hours as needed for moderate pain.    Historical Provider, MD  lisinopril (PRINIVIL,ZESTRIL) 10 MG tablet Take 2 tablets (20 mg total) by mouth daily. 02/23/14   Candee Furbish, MD  paliperidone (INVEGA) 6 MG 24 hr tablet Take 6 mg by mouth  daily.    Historical Provider, MD    Scheduled Meds:  Infusions:  PRN Meds:    Allergies as of 06/24/2014  . (No Known Allergies)    Family History  Problem Relation Age of Onset  . Diabetes Brother     Mother too  . Hypertension      family  . Sickle cell anemia Sister         History   Social History  . Marital Status: Single    Spouse Name: N/A    Number of Children: N/A  . Years of Education: N/A   Occupational History  . Not on file.   Social History Main Topics  . Smoking status: Current Every Day Smoker -- 0.50 packs/day for 34 years    Types: Cigarettes  . Smokeless tobacco: Not on file     Comment: Smoke since 53 years old  . Alcohol Use: Yes     Comment: 1-2 40oz daily  . Drug Use: Yes    Special: Marijuana, Cocaine     Comment: Cocaine 15 years ago, Marijuana 4 mo ago  . Sexual Activity: Not on file   Other Topics Concern  . Not on file   Social History Narrative  . Unemployed.  Lives with his mother in [place].     REVIEW OF SYSTEMS: Constitutional:  No significant weight loss ENT:  No nose bleeds Pulm:  No SOB or cough.  No asthma CV:  No palpitations, no LE edema. No chest pain.   GU:  No hematuria, no frequency GI:  Per HPI.  No  Dysphagia, no anorexia Heme:  Per HPI   Transfusions:  none Neuro:  No headaches, no peripheral tingling or numbness Derm:  No itching, no rash or sores.  Endocrine:  No sweats or chills.  No polyuria or dysuria Immunization:  Not queried Travel:  None beyond local counties in last few months.    PHYSICAL EXAM: Vital signs in last 24 hours: Filed Vitals:   06/24/14 1315  BP: 135/62  Pulse: 92  Temp: 97.3 F (36.3 C)  Resp: 20   Wt Readings from Last 3 Encounters:  04/29/14 82.101 kg (181 lb)  03/18/14 81.557 kg (179 lb 12.8 oz)  02/23/14 78.926 kg (174 lb)    General: sleepy.  AAM who is comfortable.  Mumbles his answers.  Is comfortable and does not look ill.  Head:  No asymmetry or  swelling    Eyes:  No icterus.  Conjunctiva pale. Ears:  ? HOH?  Nose:  No discharge, no blood in nostrils Mouth:  Poor dentition.  Moist, clear and pink MM Neck:  No JVD, no bruits, no TMG or mass Lungs:  Clear bil.  Quiet, unlabored resps.  No cough Heart: RRR.  No MRG Abdomen:  Soft, NT, BS hypoactive.  Liver edge 3 to 4 cm below costal edge, not tender.  Belly protuberant, no fluid wave.   Rectal: red blood per ED doc.  GU:  No scrotal or penile swelling. Circumcised.   Musc/Skeltl: no joint swelling or pain Extremities:  No pedal or LE edema.  Feet warm.  Cap refill brisk.  Neurologic:  Oriented x 3.  No tremor Skin:  No rash, no sores Tattoos:  None seen Nodes:  No inguinal adenopathy   Psych:  Cooperative, somnolent but arouseable.   Intake/Output from previous day:   Intake/Output this shift: Total I/O In: 2347.5 [I.V.:2000; Blood:347.5] Out: -   LAB RESULTS:  Recent Labs  06/24/14 1100 06/24/14 1114  WBC 8.5  --   HGB 6.2* 6.8*  HCT 18.2* 20.0*  PLT 94*  --    BMET Lab Results  Component Value Date   NA 143 06/24/2014   NA 142 06/24/2014   NA 143 02/10/2014   K 4.1 06/24/2014   K 4.3 06/24/2014   K 4.1 02/10/2014   CL 114* 06/24/2014   CL 110 06/24/2014   CL 109 02/10/2014   CO2 11* 06/24/2014   CO2 20 02/10/2014   CO2 21 02/09/2014   GLUCOSE 124* 06/24/2014   GLUCOSE 130* 06/24/2014   GLUCOSE 111* 02/10/2014   BUN 14 06/24/2014   BUN 15 06/24/2014   BUN 14 02/10/2014   CREATININE 1.90* 06/24/2014   CREATININE 1.61* 06/24/2014   CREATININE 1.24 02/10/2014   CALCIUM 7.0* 06/24/2014   CALCIUM 8.2* 02/10/2014   CALCIUM 7.9* 02/09/2014   LFT  Recent Labs  06/24/14 1100  PROT 4.7*  ALBUMIN 1.4*  AST 71*  ALT 20  ALKPHOS 85  BILITOT 0.4   PT/INR Lab Results  Component Value Date   INR 1.87* 06/24/2014       Protime                   21.5             Hepatitis Panel No results found for this basename: HEPBSAG, HCVAB, HEPAIGM, HEPBIGM,  in the last 72 hours  Drugs of Abuse      Component Value Date/Time   LABOPIA NONE DETECTED 03/30/2008 [place] 03/30/2008 1852   LABBENZ NONE DETECTED 03/30/2008 1852   AMPHETMU NONE DETECTED 03/30/2008 1852   THCU NONE DETECTED 03/30/2008 1852   LABBARB  Value: NONE DETECTED        DRUG SCREEN FOR MEDICAL PURPOSES ONLY.  IF CONFIRMATION IS NEEDED FOR ANY PURPOSE, NOTIFY LAB WITHIN 5 DAYS. 03/30/2008 1852     RADIOLOGY STUDIES: Ct Abdomen Pelvis W Contrast  06/24/2014   COMPARISON:  CT 02/08/2014.  FINDINGS: The liver has an irregular contour suggesting cirrhosis. Multiple new hepatic low-density lesions are noted. The largest measures 2.6 cm, image number 18/series 2. Although these could represent regenerating nodules, metastatic disease or multifocal hepatocellular carcinoma cannot be excluded. There is a new enhancing 2.2 cm nodule in the dome of the liver with similar differential, image number 12/series 2. Spleen is normal. Perigastric and  periportal serpiginous densities are noted most likely varices. Periportal adenopathy cannot be excluded. Partial thrombosis of the portal vein noted. Multiple retroperitoneal varices are present. Varicosities of the mesenteric venous system noted. Pelvic varicosities particularly prominent. Splenic vein patent. Hepatic veins patent. No biliary distention. Gallbladder wall edema noted, this may be from hypoproteinemia. Mild ascites present.  Adrenals normal. No focal renal abnormality. No hydronephrosis or obstructing ureteral stone. The bladder is unremarkable. Calcification within the prostate.  No significant adenopathy. Aorta normal caliber with atherosclerotic vascular changes. Celiac and mesenteric arteries patent.  Appendix normal. Stool noted throughout the colon. Umbilical hernia is noted with what appears to be herniated small bowel. Mild proximal and mid small bowel distention is present. This could be secondary to partial obstruction. Diffuse small bowel wall edema  noted. Although edema could be secondary to the hernia and partial obstruction, small bowel wall edema may also be secondary to hyperproteinemia and vascular congestion given the patient's partial portal vein thrombosis and varices. No pneumatosis intestinalis. No portal venous air. No free air.  Lung bases clear. Coronary artery disease. Mild cardiomegaly. Degenerative changes lumbar spine and both hips.  IMPRESSION: 1. Liver appears cirrhotic. Multiple new hepatic lesions are noted. Differential diagnosis includes regenerating nodules, metastatic disease, multifocal hepatocellular carcinoma. 2. Partial thrombosis of the portal vein. 3. Portal hypertension with multiple varices. 4. Periportal adenopathy cannot be excluded. 5. Umbilical hernia with herniation of small bowel. Partial small bowel obstruction cannot be excluded. 6. Extensive small bowel wall edema, although this could be related to partial small bowel obstruction, hyperproteinemia and vascular congestion could also result in bowel wall edema. No pneumatosis intestinalis. No portal venous air. No free air.   Electronically Signed   By: Maisie Fus  Register   On: 06/24/2014 13:03   Dg Chest Portable 1 View  06/24/2014   CLINICAL DATA:  Shortness of breath and hypotension  EXAM: PORTABLE CHEST - 1 VIEW  COMPARISON:  PA and lateral chest x-ray of January 21, 2014  FINDINGS: The lungs are hypoinflated but clear. The cardiac silhouette is mildly enlarged. The pulmonary vascularity is normal. There is no pleural effusion. The bony thorax is unremarkable.  IMPRESSION: The study is limited due to hypoinflation. There is new enlargement of the cardiac silhouette without evidence of pulmonary edema.   Electronically Signed   By: David  [place]   On: 06/24/2014 11:33   2 D Echo 02/16/2014 Study Conclusions - Left ventricle: The cavity size was normal. Wall thickness was increased in a pattern of severe LVH. Systolic function was normal. The estimated ejection  fraction was in the range of 55% to 60%. Wall motion was normal; there were no regional wall motion abnormalities. Features are consistent with a pseudonormal left ventricular filling pattern, with concomitant abnormal relaxation and increased filling pressure (grade 2 diastolic dysfunction). Doppler parameters are consistent with high ventricular filling pressure. - Mitral valve: Calcified annulus. Mildly thickened leaflets - Left atrium: The atrium was moderately dilated. Impressions: - Vigorous LV function; mildly elevated LVOT velocity most likely related to vigorous LV; severe LVH (consider infiltrative cardiomyopathy such as amyloidosis); probable PFO.   ENDOSCOPIC STUDIES: none  IMPRESSION:   *  Lower GI bleed.  Rule out rapid upper GI bleed (ulcer, varices? however BUN is normal. ), rule out diverticular bleed, rule out neoplasia,  *  ABL anemia.  Transfusing #1 of 2 PRBCs.   *  New diagnoses of cirrhosis and partial PV thrombosis based on CT scan. Multiple liver nodules, propaby  regenerative nodules but need AFP level checked to rule out [place], along with hepatitis serologies.   *  Thrombocytopenia  *  Coagulopathy. New.   *  ETOH abuse,  ? Dependence.  *  S/P 02/08/14 repair of incarcerated right inguinal hernia. Today's CT showing umbilical hernia with herniation of SB.  No evidence or clinical sxs to suggest obstruction.   *  Diastolic dysfunction on 2 D Echo of 02/2014.    PLAN:     *  Start Protonix 40 mg IV q 24 hours, empiric until undergoes EGD. ? Start Octreotide. Hold decision on this til after EGD.  *  EGD today      Azucena Freed  06/24/2014, 1:22 PM Pager: [phone]  GI Attending Note   Chart was reviewed and patient was examined. X-rays and lab were reviewed.    I agree with management and plans.  Hematochezia may be secondary to a brisk upper GI bleed.  Esophageal varices should be ruled out in view of CT findings that demonstrated a well  established cirrhosis, portal hypertension and varices.  Multiple liver nodules may be regenerative nodules but HCC is also concern.  Plan emergent endoscopy.  Patient should get vitamin K and be placed on broad-spectrum antibiotics if varices are seen.  Sandy Salaam. [name], M.D., [facility] Cell [phone]

## 2014-06-24 NOTE — ED Notes (Signed)
Pt received 2 liters of NS from EMS

## 2014-06-24 NOTE — ED Notes (Signed)
Placed nasal canula 5L Zap. Sats 88%. Placed on nonrebreather 93%

## 2014-06-24 NOTE — ED Notes (Signed)
Pt's sats 100% on room air.

## 2014-06-24 NOTE — Anesthesia Preprocedure Evaluation (Signed)
Anesthesia Evaluation  Patient identified by MRN, date of birth, ID band Patient awake    Reviewed: Allergy & Precautions, H&P , NPO status , Patient's Chart, lab work & pertinent test results  History of Anesthesia Complications Negative for: history of anesthetic complications  Airway Mallampati: II TM Distance: >3 FB Neck ROM: Full    Dental  (+) Missing   Pulmonary neg sleep apnea, neg COPDneg recent URI, Current Smoker,          Cardiovascular hypertension, Pt. on medications and Pt. on home beta blockers - angina+CHF - CAD and - Past MI Rhythm:Regular     Neuro/Psych PSYCHIATRIC DISORDERS Depression negative neurological ROS     GI/Hepatic negative GI ROS, Neg liver ROS,   Endo/Other    Renal/GU CRFRenal disease     Musculoskeletal   Abdominal   Peds  Hematology  (+) anemia ,   Anesthesia Other Findings   Reproductive/Obstetrics                           Anesthesia Physical Anesthesia Plan  ASA: III  Anesthesia Plan: MAC   Post-op Pain Management:    Induction:   Airway Management Planned: Natural Airway  Additional Equipment: None  Intra-op Plan:   Post-operative Plan:   Informed Consent: I have reviewed the patients History and Physical, chart, labs and discussed the procedure including the risks, benefits and alternatives for the proposed anesthesia with the patient or authorized representative who has indicated his/her understanding and acceptance.   Dental advisory given  Plan Discussed with: CRNA and Surgeon  Anesthesia Plan Comments:         Anesthesia Quick Evaluation

## 2014-06-24 NOTE — ED Notes (Signed)
Pt transported to Endo with [employer] [facility]. Pt sent with his belongings- pants and bag of medications.

## 2014-06-24 NOTE — Progress Notes (Signed)
Upper endoscopy demonstrated rate for subtle varices but there is no blood in the upper GI tract.  Accordingly, he appears to be having a lower GI bleed.  Recommendations #1 Tagged red cell scan; if positive proceed with arteriography

## 2014-06-24 NOTE — Procedures (Signed)
Arterial Catheter Insertion Procedure Note Edwin Love [id number] Oct 11, 1961  Procedure: Insertion of Arterial Catheter  Indications: Blood pressure monitoring  Procedure Details Consent: Risks of procedure as well as the alternatives and risks of each were explained to the (patient/caregiver).  Consent for procedure obtained. Time Out: Verified patient identification, verified procedure, site/side was marked, verified correct patient position, special equipment/implants available, medications/allergies/relevent history reviewed, required imaging and test results available.  Performed  Maximum sterile technique was used including antiseptics, cap, gloves, gown, hand hygiene, mask and sheet. Skin prep: Chlorhexidine; local anesthetic administered 20 gauge catheter was inserted into right radial artery using the Seldinger technique.  Evaluation Blood flow good; BP tracing good. Complications: No apparent complications.   Edwin Love 06/24/2014

## 2014-06-24 NOTE — ED Notes (Signed)
Internal MD at bedside.

## 2014-06-24 NOTE — ED Provider Notes (Signed)
CSN: [id number]     Arrival date & time 06/24/14  1023 History   None    Chief Complaint  Patient presents with  . GI Bleeding   Level V cavea severity of patient's condition HPI The patient presents to the emergency room with acute rectal bleeding. Patient has a history of alcohol abuse according to the EMS report as well as intestinal bleeding in the past.  Patient was then by family this morning lying on his bed. He had bright red blood around him. There is also blood noted on the floor in the bathroom.  Patient is having a hard time providing history to me. He admits to drinking some alcohol last night. He said he is having some pain in his lower abdomen and back. He can't tell me exactly when the bleeding started. He denies any injuries. Past Medical History  Diagnosis Date  . Hypertension   . Back pain   . Renal disorder     CKD stage 3 noted in 02/2014 H & P  . Anemia 01/2014  . LVH (left ventricular hypertrophy) 02/2014    grade 2 diastolic dysfunction.   . Ankle fracture, left 10/2008    non surgical mgt.   . Thrombocytopenia 06/2014  . Coagulopathy 06/2014   Past Surgical History  Procedure Laterality Date  . Eye surgery Left 1990's    Foreign body  . Inguinal hernia repair Right 02/08/2014    Procedure: HERNIA REPAIR INGUINAL INCARCERATED;  Surgeon: Edward Jolly, MD;  Location: [facility];  Service: General;  Laterality: Right;  . Insertion of mesh Right 02/08/2014    Procedure: INSERTION OF MESH;  Surgeon: Edward Jolly, MD;  Location: [facility];  Service: General;  Laterality: Right;  . Hernia repair     Family History  Problem Relation Age of Onset  . Diabetes Brother     Mother too  . Hypertension      family  . Sickle cell anemia Sister    History  Substance Use Topics  . Smoking status: Current Every Day Smoker -- 0.50 packs/day for 34 years    Types: Cigarettes  . Smokeless tobacco: Not on file     Comment: Smoke since 53 years old  . Alcohol Use: Yes   Comment: 1-2 40oz daily    Review of Systems  Constitutional: Negative for fever.  Respiratory: Negative for shortness of breath.   Cardiovascular: Negative for chest pain.  All other systems reviewed and are negative.     Allergies  Review of patient's allergies indicates no known allergies.  Home Medications   Prior to Admission medications   Medication Sig Start Date End Date Taking? Authorizing Provider  amLODipine (NORVASC) 5 MG tablet Take 1 tablet (5 mg total) by mouth daily. 02/23/14   Candee Furbish, MD  carvedilol (COREG) 25 MG tablet Take 1 tablet (25 mg total) by mouth 2 (two) times daily. 02/23/14   Candee Furbish, MD  citalopram (CELEXA) 20 MG tablet Take 20 mg by mouth daily.    Historical Provider, MD  hydrOXYzine (ATARAX/VISTARIL) 50 MG tablet Take 50 mg by mouth daily at 10 pm.    Historical Provider, MD  ibuprofen (ADVIL,MOTRIN) 200 MG tablet Take 400 mg by mouth every 6 (six) hours as needed for moderate pain.    Historical Provider, MD  lisinopril (PRINIVIL,ZESTRIL) 10 MG tablet Take 2 tablets (20 mg total) by mouth daily. 02/23/14   Candee Furbish, MD  paliperidone (INVEGA) 6 MG 24 hr  tablet Take 6 mg by mouth daily.    Historical Provider, MD   BP 135/62  Pulse 92  Temp(Src) 97.3 F (36.3 C) (Oral)  Resp 20  SpO2 100% Physical Exam  Nursing note and vitals reviewed. Constitutional: He appears listless. He appears distressed.  Moaning, Slow to answer questions  HENT:  Head: Normocephalic.  Right Ear: External ear normal.  Left Ear: External ear normal.  Eyes: Conjunctivae are normal. Right eye exhibits no discharge. Left eye exhibits no discharge. No scleral icterus.  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 No tracheal deviation present.  Cardiovascular: Normal rate, regular rhythm and intact distal pulses.   Pulmonary/Chest: Effort normal and breath sounds normal. No stridor. No respiratory distress. He has no wheezes. He has no rales.  Abdominal: Soft. Bowel  sounds are normal. He exhibits no distension, no ascites and no mass. There is tenderness in the right lower quadrant. There is no rebound and no guarding. No hernia.  Genitourinary:  No mass noted on rectal exam, bright red blood from the rectum  Musculoskeletal: He exhibits no edema and no tenderness.  Neurological: He appears listless. He is not disoriented. No cranial nerve deficit (no facial droop, extraocular movements intact, no slurred speech) or sensory deficit. He exhibits normal muscle tone. He displays no seizure activity.  Generalized weakness, slow to answer questions and follow commands  Skin: Skin is warm and dry. No rash noted.  Psychiatric: He has a normal mood and affect.    ED Course  Procedures  CRITICAL CARE Performed by: CZYSA,YTK Total critical care time: 40 Critical care time was exclusive of separately billable procedures and treating other patients. Critical care was necessary to treat or prevent imminent or life-threatening deterioration. Critical care was time spent personally by me on the following activities: development of treatment plan with patient and/or surrogate as well as nursing, discussions with consultants, evaluation of patient's response to treatment, examination of patient, obtaining history from patient or surrogate, ordering and performing treatments and interventions, ordering and review of laboratory studies, ordering and review of radiographic studies, pulse oximetry and re-evaluation of patient's condition.  1030  Pt hypotensive on arrival.  BP has increased to 80s with fluid bolus.  Will continue fluid bolus.  Emergent blood transfusion ordered considering his persistent bleeding and low bp.  Will monitor closely.  Protecting airway at this time. 1125  BP is improving now.  104/63 .  Blood is transfusing.  Will decrease saline resuscitation.  Continue to monitor.  MS still is not normal.  Continues to answer questions though 1156  BP is stable.    1342  CT is back.  Repeated exam.  No incarcerated hernia on exam.  No ttp on abx exam Labs Review Labs Reviewed  CBC WITH DIFFERENTIAL - Abnormal; Notable for the following:    RBC 2.26 (*)    Hemoglobin 6.2 (*)    HCT 18.2 (*)    RDW 20.7 (*)    Platelets 94 (*)    All other components within normal limits  COMPREHENSIVE METABOLIC PANEL - Abnormal; Notable for the following:    CO2 11 (*)    Glucose, Bld 130 (*)    Creatinine, Ser 1.61 (*)    Calcium 7.0 (*)    Total Protein 4.7 (*)    Albumin 1.4 (*)    AST 71 (*)    GFR calc non Af Amer 47 (*)    GFR calc Af Amer 55 (*)  Anion gap 21 (*)    All other components within normal limits  PROTIME-INR - Abnormal; Notable for the following:    Prothrombin Time 21.5 (*)    INR 1.87 (*)    All other components within normal limits  LIPASE, BLOOD - Abnormal; Notable for the following:    Lipase 78 (*)    All other components within normal limits  ETHANOL - Abnormal; Notable for the following:    Alcohol, Ethyl (B) 131 (*)    All other components within normal limits  I-STAT CHEM 8, ED - Abnormal; Notable for the following:    Chloride 114 (*)    Creatinine, Ser 1.90 (*)    Glucose, Bld 124 (*)    Calcium, Ion 1.03 (*)    Hemoglobin 6.8 (*)    HCT 20.0 (*)    All other components within normal limits  APTT  AMMONIA  BLOOD GAS, ARTERIAL  HEPATITIS C ANTIBODY  HEPATITIS B SURFACE ANTIGEN  HEPATITIS B SURFACE ANTIBODY  AFP TUMOR MARKER  I-STAT TROPOININ, ED  TYPE AND SCREEN  PREPARE RBC (CROSSMATCH)  ABO/RH    Imaging Review Ct Abdomen Pelvis W Contrast  06/24/2014   CLINICAL DATA:  GI bleeding.  Pain.  EXAM: CT ABDOMEN AND PELVIS WITH CONTRAST  TECHNIQUE: Multidetector CT imaging of the abdomen and pelvis was performed using the standard protocol following bolus administration of intravenous contrast.  CONTRAST:  4mL OMNIPAQUE IOHEXOL 300 MG/ML  SOLN  COMPARISON:  CT 02/08/2014.  FINDINGS: The liver has an irregular  contour suggesting cirrhosis. Multiple new hepatic low-density lesions are noted. The largest measures 2.6 cm, image number 18/series 2. Although these could represent regenerating nodules, metastatic disease or multifocal hepatocellular carcinoma cannot be excluded. There is a new enhancing 2.2 cm nodule in the dome of the liver with similar differential, image number 12/series 2. Spleen is normal. Perigastric and periportal serpiginous densities are noted most likely varices. Periportal adenopathy cannot be excluded. Partial thrombosis of the portal vein noted. Multiple retroperitoneal varices are present. Varicosities of the mesenteric venous system noted. Pelvic varicosities particularly prominent. Splenic vein patent. Hepatic veins patent. No biliary distention. Gallbladder wall edema noted, this may be from hypoproteinemia. Mild ascites present.  Adrenals normal. No focal renal abnormality. No hydronephrosis or obstructing ureteral stone. The bladder is unremarkable. Calcification within the prostate.  No significant adenopathy. Aorta normal caliber with atherosclerotic vascular changes. Celiac and mesenteric arteries patent.  Appendix normal. Stool noted throughout the colon. Umbilical hernia is noted with what appears to be herniated small bowel. Mild proximal and mid small bowel distention is present. This could be secondary to partial obstruction. Diffuse small bowel wall edema noted. Although edema could be secondary to the hernia and partial obstruction, small bowel wall edema may also be secondary to hyperproteinemia and vascular congestion given the patient's partial portal vein thrombosis and varices. No pneumatosis intestinalis. No portal venous air. No free air.  Lung bases clear. Coronary artery disease. Mild cardiomegaly. Degenerative changes lumbar spine and both hips.  IMPRESSION: 1. Liver appears cirrhotic. Multiple new hepatic lesions are noted. Differential diagnosis includes regenerating  nodules, metastatic disease, multifocal hepatocellular carcinoma. 2. Partial thrombosis of the portal vein. 3. Portal hypertension with multiple varices. 4. Periportal adenopathy cannot be excluded. 5. Umbilical hernia with herniation of small bowel. Partial small bowel obstruction cannot be excluded. 6. Extensive small bowel wall edema, although this could be related to partial small bowel obstruction, hyperproteinemia and vascular congestion could also result  in bowel wall edema. No pneumatosis intestinalis. No portal venous air. No free air.   Electronically Signed   By: Marcello Moores  Register   On: 06/24/2014 13:03   Dg Chest Portable 1 View  06/24/2014   CLINICAL DATA:  Shortness of breath and hypotension  EXAM: PORTABLE CHEST - 1 VIEW  COMPARISON:  PA and lateral chest x-ray of January 21, 2014  FINDINGS: The lungs are hypoinflated but clear. The cardiac silhouette is mildly enlarged. The pulmonary vascularity is normal. There is no pleural effusion. The bony thorax is unremarkable.  IMPRESSION: The study is limited due to hypoinflation. There is new enlargement of the cardiac silhouette without evidence of pulmonary edema.   Electronically Signed   By: David  Martinique   On: 06/24/2014 11:33     EKG Interpretation   Date/Time:  Thursday June 24 2014 10:41:58 EDT Ventricular Rate:  75 PR Interval:  132 QRS Duration: 93 QT Interval:  470 QTC Calculation: 525 R Axis:   66 Text Interpretation:  Sinus rhythm Multiple premature complexes, vent   Consider left ventricular hypertrophy Prolonged QT interval Baseline  wander in lead(s) V4 Since last tracing rate slower repolarization changes  are no longer present Confirmed by Talana Slatten  MD-J, Webb Weed ([id number]) on 06/24/2014  10:46:08 AM      MDM   Final diagnoses:  Gastrointestinal hemorrhage associated with anorectal source  Hemorrhagic shock  Umbilical hernia without obstruction and without gangrene   Patient presented to the emergency room with acute GI  bleed. He initially presented in hemorrhagic shock. He was resuscitated with IV fluids and transfused 2 units of blood emergently. The patient's blood pressure has improved now. Blood pressure is now 135/62. CT scan showed an umbilical hernia and they could not exclude a partial small bowel obstruction associated with that. On exam however, the patient does not have any evidence of an incarcerated abdominal hernia. Do not think this is related to his intestinal bleeding and overall I doubt obstruction at this time.  Gastroenterology as evaluated the patient. They plan on evaluating him further today.  Consult the medical service regarding admission. He'll likely need to be monitored in the step down unit.      Dorie Rank, MD 06/24/14 1345

## 2014-06-24 NOTE — ED Notes (Signed)
Obtained consent for EGD. Pt alert and able to sign. Second RN present and witnessed

## 2014-06-24 NOTE — Progress Notes (Signed)
ABG stuck x 2. The 1st time, I was able to get a small amount of blood and there was a sample error. The 2nd time, I was unable to obtain. MD aware and canceling order

## 2014-06-24 NOTE — Procedures (Signed)
Central Venous Catheter Insertion Procedure Note SANJITH SIWEK [id number] 1960-12-12  Procedure: Insertion of Large Bore Central Venous Catheter Indications: Assessment of intravascular volume, Drug and/or fluid administration and Frequent blood sampling  Procedure Details Consent: Unable to obtain consent because of emergent medical necessity. Time Out: Verified patient identification, verified procedure, site/side was marked, verified correct patient position, special equipment/implants available, medications/allergies/relevent history reviewed, required imaging and test results available.  Performed  Maximum sterile technique was used including antiseptics, cap, gloves, gown, hand hygiene, mask and sheet. Skin prep: Chlorhexidine; local anesthetic administered A antimicrobial bonded/coated triple lumen catheter was placed in the left internal jugular vein using the Seldinger technique.  Evaluation Blood flow good Complications: No apparent complications Patient did tolerate procedure well. Chest X-ray ordered to verify placement.  CXR: pending.  Procedure performed under direct ultrasound guidance for real time vessel cannulation.      Montey Hora, [facility] Pgr: [phone]  or [phone]  Baltazar Apo, MD, PhD 06/24/2014, 11:17 PM [facility] [phone] or if no answer [phone]

## 2014-06-24 NOTE — ED Notes (Signed)
Pt finished contrast. Notified CT 

## 2014-06-24 NOTE — ED Notes (Signed)
CBG 157 per EMS

## 2014-06-24 NOTE — ED Notes (Signed)
Notified MD that pt's temp rose from 97.3 to 98. MD aware and no further orders at this time. Pt stable no respiratory distress, no complaints

## 2014-06-24 NOTE — Transfer of Care (Signed)
Immediate Anesthesia Transfer of Care Note  Patient: Edwin Love  Procedure(s) Performed: Procedure(s): ESOPHAGOGASTRODUODENOSCOPY (EGD) (N/A)  Patient Location: PACU and Endoscopy Unit  Anesthesia Type:MAC  Level of Consciousness: awake and alert   Airway & Oxygen Therapy: Patient Spontanous Breathing and Patient connected to nasal cannula oxygen  Post-op Assessment: Report given to PACU RN and Post -op Vital signs reviewed and stable  Post vital signs: Reviewed and stable  Complications: No apparent anesthesia complications

## 2014-06-24 NOTE — Progress Notes (Signed)
Called by Dr. Pascal Lux with + bleeding scan, felt to be acute GI bleeding from sigmoid colon EGD with varices today but no evidence of bleeding from the UGI tract Colon is unprepped currently Dr. Pascal Lux and I discussed angiography and he is concerned about his coagulopathy and renal insufficiency with the contrast dye involved with angio. With that in mind, I have ordered FFP x 2 units, [place] Vit K 10 mg x 3 doses.  pRBC to maintain Hgb ~ 8.   If active bleeding continues, my recommendation is to proceed with angiography for control of hemorrhage even with higher risk, as every therapeutic option in this critically ill patient carries elevated risk. I have also discussed this plan with my partner, Dr. [name], who is agreement for angiography for active bleeding after FFP.

## 2014-06-24 NOTE — H&P (Deleted)
Name: Edwin Love MRN: [id number] DOB: 12-May-1961    LOS: 0  Referring Provider:  [name] Reason for Referral:  Active GI bleed with hypotension  PULMONARY / CRITICAL CARE MEDICINE  HPI:  Edwin Love is a 53 y.o. male presenting by EMS after being found by family with large volume BRBPR.   The history is provided by patient as well as medical record. Edwin Love was found by family this morning in his bed surrounded by blood. He is unable to report when this started, though there was blood on the bathroom floor as well. There was an odor of alcohol and he has a history of heavy drinking. EMS reports hypotension at the scene, though he remained responsive on arrival where he was resuscitated with IVF and 2u PRBCs.   He went to endoscopy where he continued to have large volume BRBPR. He had a positive bleeding scan with likely source in the sigmoid colon. GI decided to hold of on angiography given renal insufficiency. He was given 2 units FFP and Vitamin K.  On arrival to the ICU he continued to have active bleeding. He was given another 2u pRBCs and a L IJ cordis was placed.   Past Medical History  Diagnosis Date  . Hypertension   . Back pain   . Renal disorder     CKD stage 3 noted in 02/2014 H & P  . Anemia 01/2014  . LVH (left ventricular hypertrophy) 02/2014    grade 2 diastolic dysfunction.   . Ankle fracture, left 10/2008    non surgical mgt.   . Thrombocytopenia 06/2014  . Coagulopathy 06/2014   Past Surgical History  Procedure Laterality Date  . Eye surgery Left 1990's    Foreign body  . Inguinal hernia repair Right 02/08/2014    Procedure: HERNIA REPAIR INGUINAL INCARCERATED;  Surgeon: Edwin Jolly, MD;  Location: [facility];  Service: General;  Laterality: Right;  . Insertion of mesh Right 02/08/2014    Procedure: INSERTION OF MESH;  Surgeon: Edwin Jolly, MD;  Location: [facility];  Service: General;  Laterality: Right;  . Hernia repair     Prior to  Admission medications   Medication Sig Start Date End Date Taking? Authorizing Provider  amLODipine (NORVASC) 5 MG tablet Take 1 tablet (5 mg total) by mouth daily. 02/23/14  Yes Candee Furbish, MD  carvedilol (COREG) 25 MG tablet Take 1 tablet (25 mg total) by mouth 2 (two) times daily. 02/23/14  Yes Candee Furbish, MD  citalopram (CELEXA) 20 MG tablet Take 20 mg by mouth daily.   Yes Historical Provider, MD  hydrOXYzine (ATARAX/VISTARIL) 50 MG tablet Take 50 mg by mouth daily at 10 pm.   Yes Historical Provider, MD  ibuprofen (ADVIL,MOTRIN) 200 MG tablet Take 400 mg by mouth every 6 (six) hours as needed for moderate pain.   Yes Historical Provider, MD  lisinopril (PRINIVIL,ZESTRIL) 10 MG tablet Take 2 tablets (20 mg total) by mouth daily. 02/23/14  Yes Candee Furbish, MD  paliperidone (INVEGA) 6 MG 24 hr tablet Take 6 mg by mouth daily.   Yes Historical Provider, MD   Allergies No Known Allergies  Family History Family History  Problem Relation Age of Onset  . Diabetes Brother     Mother too  . Hypertension      family  . Sickle cell anemia Sister    Social History  reports that he has been smoking Cigarettes.  He has a 17 pack-year smoking history.  He does not have any smokeless tobacco history on file. He reports that he drinks alcohol. He reports that he uses illicit drugs (Marijuana and Cocaine).  Review Of Systems:  Endorsing feeling cold but otherwise limited by patient distress.  Brief patient description:  Edwin Love is a 53 y.o. male presenting with acute GI bleed. PMH is significant for alcohol abuse, history of GI bleeding, HTN, CKD, HFpEF (grade 2 DD).   Vital Signs: Temp:  [97.3 F (36.3 C)-98.4 F (36.9 C)] 98.4 F (36.9 C) (07/16 1430) Pulse Rate:  [75-130] 130 (07/16 1950) Resp:  [12-27] 25 (07/16 1950) BP: (81-163)/(40-98) 149/80 mmHg (07/16 1950) SpO2:  [81 %-100 %] 99 % (07/16 1950) Weight:  [190 lb (86.183 kg)] 190 lb (86.183 kg) (07/16 1443)  Physical  Examination: General: Ill-appearing, drowsy, pale 53 y.o. male  HEENT: NCAT, MMM, enucleated left eye  Cardiovascular: Regular rate, 1+ flow murmur  Respiratory: Non-labored, 100% on O2, CTA anteriorly  Abdomen: +BS, + easily reducible umbilical hernia, NT, ND, soft  Extremities: No obvious deformities  Skin: Pale, no wounds noted  Neuro: Drowsy, poorly responsive and slowly speaking. +oriented. No focal deficits in strength (diffusely weak)  Active Problems:   Acute posthemorrhagic anemia   Blood in stool   Alcoholic cirrhosis   Esophageal varices without bleeding   GI bleed  ASSESSMENT AND PLAN  PULMONARY No results found for this basename: PHART, PCO2, PCO2ART, PO2ART, HCO3, O2SAT,  in the last 168 hours Ventilator Settings:   ETT:  None  A:  Stable P:   Monitor, high risk for respiratory failure   CARDIOVASCULAR No results found for this basename: TROPONINI, LATICACIDVEN,  O2SATVEN, PROBNP,  in the last 168 hours ECG:  Sinus rhythm, long QT, multiple premature complexes Lines: L IJ cordis  A: Hemorrhagic shock, improved with fluids and pRBCs Chronic HTN HFpEF P:  Continue fluid resuscitation Consider addition of pressor if unable to maintain pressures with volume resuscitation Hold home antihypertensives in the setting of acute blood loss  RENAL  Recent Labs Lab 06/24/14 1100 06/24/14 1114  NA 142 143  K 4.3 4.1  CL 110 114*  CO2 11*  --   BUN 15 14  CREATININE 1.61* 1.90*  CALCIUM 7.0*  --    Intake/Output     07/16 0701 - 07/17 0700   I.V. (mL/kg) 2250 (26.1)   Blood 347.5   Total Intake(mL/kg) 2597.5 (30.1)   Urine (mL/kg/hr) 300   Total Output 300   Net +2297.5        Foley:  none  A: AoCKD, Stage 3, baseline creatinine 0.9-1.4, 1.6 on admission P:   Volume resuscitation Monitor BMET  GASTROINTESTINAL  Recent Labs Lab 06/24/14 1100  AST 71*  ALT 20  ALKPHOS 85  BILITOT 0.4  PROT 4.7*  ALBUMIN 1.4*    A:   GI bleed -  sigmoid colon source Cirrhosis, alcoholic Hepatitis C Ab positive P:   Management of bleed per GI, will hold off on angiography unless active bleeding continues  NPO PPI  HEMATOLOGIC  Recent Labs Lab 06/24/14 1100 06/24/14 1114  HGB 6.2* 6.8*  HCT 18.2* 20.0*  PLT 94*  --   INR 1.87*  --   APTT 33  --    A:   Acute blood loss anemia - s/p 4u pRBCs so far Thrombocytopenia - 94 on admission Coagulopathy P:  Monitor CBC Goal hemoglobin >8 per GI Transfuse as needed  INFECTIOUS  Recent Labs Lab 06/24/14  1100  WBC 8.5   Cultures: HCV Ab + HBV Ab - HBV Ag -  Antibiotics: None  A:  No acute infection Unclear on prior hepatitis vs. new dx P:   Will need ID follow-up outpatient for possible HCV treatment if this is new for him  ENDOCRINE  Recent Labs Lab 06/24/14 2030  GLUCAP 130*   A:  Stable  P:   Monitor glucose on BMET  NEUROLOGIC  A:   Alcohol intoxication Chronic alcoholism P:   CIWA protocol - ativan prn Thiamine and folate   Beverlyn Roux, MD, MPH [place] PGY-2 06/24/2014 9:40 PM

## 2014-06-24 NOTE — ED Notes (Signed)
Results of chem 8 given to Dr. Hillard Danker

## 2014-06-24 NOTE — H&P (Signed)
PULMONARY / CRITICAL CARE MEDICINE   Name: Edwin Love MRN: [id number] DOB: 09/15/1961    ADMISSION DATE: 06/24/2014  CONSULTATION DATE: 06/24/2014   REFERRING MD : [name]  PRIMARY SERVICE: PCCM   CHIEF COMPLAINT: Lower GI Hemorrhage   BRIEF PATIENT DESCRIPTION: 53 y.o. M with cirrhosis and esophageal varices brought to ED after being found lying in pool of blood by family. Underwent EGD 7/16 which was negative for UGIB; however, pt continued to have massive hematochezia. Bleeding scan positive for LGIB in sigmoid colon. PCCM consulted and pt admitted to ICU.   SIGNIFICANT EVENTS / STUDIES:  7/16 Abd CT >>> cirrhosis, multiple new hepatic lesions are noted, partial thrombosis of portal vein, portal HTN with multiple varices, periportal adenopathy cannot be excluded, umbilical hernia with herniation of small bowel, extensive small bowel wall edema. 7/16 EGD >>> varices present; however, no acute bleeding.  7/16 Bleeding Scan >>> positive for acute lower GI bleed with source likely within the sigmoid colon.   LINES / TUBES:  Left IJ large bore TLC 7/16 >>>   CULTURES:  7/16 HIV Ab >>> 7/16 HCV Ab >>> POS  7/16 AFP >>> POS  ANTIBIOTICS:  None   HISTORY OF PRESENT ILLNESS: Edwin Love is a 53 y.o. male presenting by EMS after being found by family with large volume BRBPR.  The history is provided by patient as well as medical record. Edwin Love was found by family this morning in his bed surrounded by blood. He is unable to report when this started, though there was blood on the bathroom floor as well. There was an odor of alcohol and he has a history of heavy drinking. EMS reports hypotension at the scene, though he remained responsive on arrival where he was resuscitated with IVF and 2u PRBCs.  He went to endoscopy where he continued to have large volume BRBPR. He had a positive bleeding scan with likely source in the sigmoid colon. GI decided to hold of on  angiography given renal insufficiency. He was given 2 units FFP and Vitamin K.  On arrival to the ICU he continued to have active bleeding. He was given another 2u pRBCs and a L IJ cordis was placed.    PAST MEDICAL HISTORY :  Past Medical History  Diagnosis Date  . Hypertension   . Back pain   . Renal disorder     CKD stage 3 noted in 02/2014 H & P  . Anemia 01/2014  . LVH (left ventricular hypertrophy) 02/2014    grade 2 diastolic dysfunction.   . Ankle fracture, left 10/2008    non surgical mgt.   . Thrombocytopenia 06/2014  . Coagulopathy 06/2014   Past Surgical History  Procedure Laterality Date  . Eye surgery Left 1990's    Foreign body  . Inguinal hernia repair Right 02/08/2014    Procedure: HERNIA REPAIR INGUINAL INCARCERATED;  Surgeon: Edward Jolly, MD;  Location: [place];  Service: General;  Laterality: Right;  . Insertion of mesh Right 02/08/2014    Procedure: INSERTION OF MESH;  Surgeon: Edward Jolly, MD;  Location: [place];  Service: General;  Laterality: Right;  . Hernia repair     Prior to Admission medications   Medication Sig Start Date End Date Taking? Authorizing Provider  amLODipine (NORVASC) 5 MG tablet Take 1 tablet (5 mg total) by mouth daily. 02/23/14  Yes Candee Furbish, MD  carvedilol (COREG) 25 MG tablet Take 1 tablet (25 mg total) by mouth  2 (two) times daily. 02/23/14  Yes Candee Furbish, MD  citalopram (CELEXA) 20 MG tablet Take 20 mg by mouth daily.   Yes Historical Provider, MD  hydrOXYzine (ATARAX/VISTARIL) 50 MG tablet Take 50 mg by mouth daily at 10 pm.   Yes Historical Provider, MD  ibuprofen (ADVIL,MOTRIN) 200 MG tablet Take 400 mg by mouth every 6 (six) hours as needed for moderate pain.   Yes Historical Provider, MD  lisinopril (PRINIVIL,ZESTRIL) 10 MG tablet Take 2 tablets (20 mg total) by mouth daily. 02/23/14  Yes Candee Furbish, MD  paliperidone (INVEGA) 6 MG 24 hr tablet Take 6 mg by mouth daily.   Yes Historical Provider, MD   No Known  Allergies  FAMILY HISTORY:  Family History  Problem Relation Age of Onset  . Diabetes Brother     Mother too  . Hypertension      family  . Sickle cell anemia Sister    SOCIAL HISTORY:  reports that he has been smoking Cigarettes.  He has a 17 pack-year smoking history. He does not have any smokeless tobacco history on file. He reports that he drinks alcohol. He reports that he uses illicit drugs (Marijuana and Cocaine).  REVIEW OF SYSTEMS:  Endorsing feeling cold but otherwise limited by patient distress.  SUBJECTIVE:   VITAL SIGNS: Temp:  [97.3 F (36.3 C)-98.5 F (36.9 C)] 98.4 F (36.9 C) (07/16 2135) Pulse Rate:  [75-130] 98 (07/16 2135) Resp:  [12-31] 21 (07/16 2135) BP: (75-163)/(34-98) 121/84 mmHg (07/16 2130) SpO2:  [81 %-100 %] 100 % (07/16 2135) Weight:  [86.183 kg (190 lb)] 86.183 kg (190 lb) (07/16 1443) HEMODYNAMICS:   VENTILATOR SETTINGS:   INTAKE / OUTPUT: Intake/Output     07/16 0701 - 07/17 0700   I.V. (mL/kg) 2250 (26.1)   Blood 372.5   Total Intake(mL/kg) 2622.5 (30.4)   Urine (mL/kg/hr) 300   Total Output 300   Net +2322.5        PHYSICAL EXAMINATION: General: Ill-appearing, drowsy, pale 53 y.o. male  HEENT: NCAT, MMM, enucleated left eye  Cardiovascular: Regular rate, 1+ flow murmur  Respiratory: Non-labored, 100% on O2, CTA anteriorly  Abdomen: +BS, + easily reducible umbilical hernia, NT, ND, soft  Extremities: No obvious deformities  Skin: Pale, no wounds noted  Neuro: Drowsy, poorly responsive and slowly speaking. +oriented. No focal deficits in strength (diffusely weak)   LABS:  CBC  Recent Labs Lab 06/24/14 1100 06/24/14 1114  WBC 8.5  --   HGB 6.2* 6.8*  HCT 18.2* 20.0*  PLT 94*  --    Coag's  Recent Labs Lab 06/24/14 1100  APTT 33  INR 1.87*   BMET  Recent Labs Lab 06/24/14 1100 06/24/14 1114  NA 142 143  K 4.3 4.1  CL 110 114*  CO2 11*  --   BUN 15 14  CREATININE 1.61* 1.90*  GLUCOSE 130* 124*    Electrolytes  Recent Labs Lab 06/24/14 1100  CALCIUM 7.0*   Sepsis Markers No results found for this basename: LATICACIDVEN, PROCALCITON, O2SATVEN,  in the last 168 hours ABG No results found for this basename: PHART, PCO2ART, PO2ART,  in the last 168 hours Liver Enzymes  Recent Labs Lab 06/24/14 1100  AST 71*  ALT 20  ALKPHOS 85  BILITOT 0.4  ALBUMIN 1.4*   Cardiac Enzymes No results found for this basename: TROPONINI, PROBNP,  in the last 168 hours Glucose  Recent Labs Lab 06/24/14 2030  GLUCAP 130*    Imaging Nm  Gi Blood Loss  06/24/2014   CLINICAL DATA:  Bloody stools since yesterday. Evaluate for GI bleed.  EXAM: NUCLEAR MEDICINE GASTROINTESTINAL BLEEDING SCAN  TECHNIQUE: Sequential abdominal images were obtained following intravenous administration of Tc-22m labeled red blood cells.  RADIOPHARMACEUTICALS:  25 mCi Tc-44m in-vitro labeled red cells.  COMPARISON:  CT the abdomen pelvis - 06/24/2014; 02/08/2014  FINDINGS: The examination is degraded secondary to patient motion artifact. There is a faint ill-defined area of persistent radiotracer activity within the right lower abdominal quadrant seen on the initial 60 min of planar imaging. There is a rapid worsening of this radiotracer seen on the provided 110 min anterior projection planar images with subsequent retrograde flow into the distal sigmoid, ascending and transverse colon.  Expected physiologic activity is seen within the hepatic parenchyma an intravascular blood pool of the abdomen and pelvis. Excreted free radiotracer is seen within the urinary bladder.  IMPRESSION: Examination is positive for acute lower GI bleed with source likely within the sigmoid colon.  Critical Value/emergent results were called by telephone at the time of interpretation on 06/24/2014 at 8:06 pm to Dr. Zenovia Jarred, who verbally acknowledged these results.   Electronically Signed   By: Sandi Mariscal M.D.   On: 06/24/2014 20:22   Ct Abdomen  Pelvis W Contrast  06/24/2014   CLINICAL DATA:  GI bleeding.  Pain.  EXAM: CT ABDOMEN AND PELVIS WITH CONTRAST  TECHNIQUE: Multidetector CT imaging of the abdomen and pelvis was performed using the standard protocol following bolus administration of intravenous contrast.  CONTRAST:  69mL OMNIPAQUE IOHEXOL 300 MG/ML  SOLN  COMPARISON:  CT 02/08/2014.  FINDINGS: The liver has an irregular contour suggesting cirrhosis. Multiple new hepatic low-density lesions are noted. The largest measures 2.6 cm, image number 18/series 2. Although these could represent regenerating nodules, metastatic disease or multifocal hepatocellular carcinoma cannot be excluded. There is a new enhancing 2.2 cm nodule in the dome of the liver with similar differential, image number 12/series 2. Spleen is normal. Perigastric and periportal serpiginous densities are noted most likely varices. Periportal adenopathy cannot be excluded. Partial thrombosis of the portal vein noted. Multiple retroperitoneal varices are present. Varicosities of the mesenteric venous system noted. Pelvic varicosities particularly prominent. Splenic vein patent. Hepatic veins patent. No biliary distention. Gallbladder wall edema noted, this may be from hypoproteinemia. Mild ascites present.  Adrenals normal. No focal renal abnormality. No hydronephrosis or obstructing ureteral stone. The bladder is unremarkable. Calcification within the prostate.  No significant adenopathy. Aorta normal caliber with atherosclerotic vascular changes. Celiac and mesenteric arteries patent.  Appendix normal. Stool noted throughout the colon. Umbilical hernia is noted with what appears to be herniated small bowel. Mild proximal and mid small bowel distention is present. This could be secondary to partial obstruction. Diffuse small bowel wall edema noted. Although edema could be secondary to the hernia and partial obstruction, small bowel wall edema may also be secondary to hyperproteinemia  and vascular congestion given the patient's partial portal vein thrombosis and varices. No pneumatosis intestinalis. No portal venous air. No free air.  Lung bases clear. Coronary artery disease. Mild cardiomegaly. Degenerative changes lumbar spine and both hips.  IMPRESSION: 1. Liver appears cirrhotic. Multiple new hepatic lesions are noted. Differential diagnosis includes regenerating nodules, metastatic disease, multifocal hepatocellular carcinoma. 2. Partial thrombosis of the portal vein. 3. Portal hypertension with multiple varices. 4. Periportal adenopathy cannot be excluded. 5. Umbilical hernia with herniation of small bowel. Partial small bowel obstruction cannot be excluded. 6. Extensive  small bowel wall edema, although this could be related to partial small bowel obstruction, hyperproteinemia and vascular congestion could also result in bowel wall edema. No pneumatosis intestinalis. No portal venous air. No free air.   Electronically Signed   By: Marcello Moores  Register   On: 06/24/2014 13:03   Dg Chest Port 1 View  06/24/2014   CLINICAL DATA:  Post central line placement  EXAM: PORTABLE CHEST - 1 VIEW  COMPARISON:  06/24/2014; 01/21/2014  FINDINGS: Grossly unchanged enlarged cardiac silhouette and mediastinal contours with atherosclerotic plaque within the thoracic aorta. Interval placement of left jugular approach intravenous catheter with tip projected over the central aspect of the left innominate vein. No pneumothorax. Grossly unchanged bilateral infrahilar heterogeneous opacities. No new focal airspace opacities. No pleural effusion. No evidence of edema. Unchanged bones.  IMPRESSION: 1. Interval placement of left jugular approach central venous catheter with tip projected over the central aspect of the left innominate vein. 2. Persistently reduced lung volumes with grossly unchanged infrahilar opacities, likely atelectasis.   Electronically Signed   By: Sandi Mariscal M.D.   On: 06/24/2014 21:46   Dg  Chest Portable 1 View  06/24/2014   CLINICAL DATA:  Shortness of breath and hypotension  EXAM: PORTABLE CHEST - 1 VIEW  COMPARISON:  PA and lateral chest x-ray of January 21, 2014  FINDINGS: The lungs are hypoinflated but clear. The cardiac silhouette is mildly enlarged. The pulmonary vascularity is normal. There is no pleural effusion. The bony thorax is unremarkable.  IMPRESSION: The study is limited due to hypoinflation. There is new enlargement of the cardiac silhouette without evidence of pulmonary edema.   Electronically Signed   By: David  Martinique   On: 06/24/2014 11:33     ASSESSMENT / PLAN:  PULMONARY A:  At risk respiratory failure  At risk pulmonary edema / TRALI  P:  Supplemental O2 as needed to maintain SpO2 > 93.  If necessary, low threshold intubation in setting of acute decompensation.  CXR in AM.  CARDIOVASCULAR A:  Hemorrhagic shock, improved with fluids and pRBCs  Chronic HTN  Diastolic CHF  P:  Continue fluid resuscitation.  Consider addition of pressor if unable to maintain pressures with volume resuscitation.  Hold home antihypertensives in the setting of acute blood loss.  Trend troponin.   RENAL A:  AoCKD, Stage 3, baseline creatinine 0.9-1.4, 1.6 on admission  P:  Volume resuscitation  Monitor BMET  GASTROINTESTINAL A:  Acute lower GI bleed - sigmoid colon source  Cirrhosis, alcoholic  Hepatitis C Ab positive - ? Old vs new diagnosis Multiple new hepatic lesions noted on CT Abdomen - unclear etiology, DDx includes regenerating nodules, metastatic  disease, multifocal hepatocellular carcinoma.  In addition, note AFP tumor marker is positive > consistent with hepatocellular CA P:  Management of bleed per GI, will hold off on angiography for now given renal failure and coagulopathy unless active bleeding continues: will consult IR if more BRBPR or dropping Hgb NPO  PPI Consider liver bx once active bleed resolved  HEMATOLOGIC A:  Acute blood loss  anemia - s/p 2u pRBCs so far, 2 more pending Thrombocytopenia - 94 on admission  Coagulopathy  P:  H / H q6hrs x 3 Monitor Coags Additional 2u PRBC for total 4 2 units FFP per GI Goal hemoglobin > 8 per GI  INFECTIOUS A:  No evidence of infection  Unclear on prior hepatitis vs. new dx  HIV pending P:  Will need ID follow-up outpatient for  possible HCV treatment if this is new for him  ENDOCRINE A:   No acute issues   P:   Monitor glucose on BMET  NEUROLOGIC A:  Alcohol abuse Chronic alcoholism  At risk alcohol withdrawal P:  Ativan PRN  Thiamine and folate   Beverlyn Roux, MD, MPH  [facility] PGY-2  06/24/2014 9:40 PM  I have personally obtained a history, examined the patient, evaluated laboratory and imaging results, formulated the assessment and plan and placed orders. CRITICAL CARE: The patient is critically ill with multiple organ systems failure and requires high complexity decision making for assessment and support, frequent evaluation and titration of therapies, application of advanced monitoring technologies and extensive interpretation of multiple databases. Critical Care Time devoted to patient care services described in this note is 60 minutes.    Baltazar Apo, MD, PhD 06/24/2014, 11:17 PM [facility] [phone] or if no answer [phone]

## 2014-06-24 NOTE — Progress Notes (Signed)
Patient having active bleeding per rectum,tranferred to 2100.

## 2014-06-24 NOTE — Op Note (Signed)
[facility] [place], [address]   ENDOSCOPY PROCEDURE REPORT  PATIENT: Edwin Love, Edwin Love  MR#: [id number] BIRTHDATE: [birth year] , 55  yrs. old GENDER: Male ENDOSCOPIST: Inda Castle, MD REFERRED BY: PROCEDURE DATE:  06/24/2014 PROCEDURE:  EGD, diagnostic ASA CLASS:     Class III INDICATIONS:  Hematochezia. MEDICATIONS: MAC sedation, administered by CRNA TOPICAL ANESTHETIC:  DESCRIPTION OF PROCEDURE: After the risks benefits and alternatives of the procedure were thoroughly explained, informed consent was obtained.  The Pentax Gastroscope [id number] endoscope was introduced through the mouth and advanced to the third portion of the duodenum. Without limitations.  The instrument was slowly withdrawn as the mucosa was fully examined.        ESOPHAGUS: There were 4 columns of large varices in the distal third of the esophagus.  The varices were not actively bleeding.  The remainder of the upper endoscopy exam was otherwise normal. There was no fresh or old blood in the upper GI tract.Retroflexed views revealed no abnormalities.     The scope was then withdrawn from the patient and the procedure completed.  COMPLICATIONS: There were no complications. ENDOSCOPIC IMPRESSION: grade 4 esophageal varices  All varices are present, it is very unlikely that they are source for bleeding, in the absence of any trace of blood in the upper GI tract.  RECOMMENDATIONS: Bleeding scan; if positive proceed with arteriography REPEAT EXAM:  eSigned:  Inda Castle, MD 06/24/2014 4:26 PM   CC:

## 2014-06-24 NOTE — H&P (Signed)
[facility] Admission History and Physical Service Pager: [phone]  Patient name: Edwin Love Medical record number: [id number] Date of birth: 02-09-1961 Age: 53 y.o. Gender: male  Primary Care Provider: Reports he has one but unable to say who Consultants: Gastroenterology Code Status: Full, per discussion with patient prior to endoscopy  Chief Complaint: Large volume hemorrhage   Assessment and Plan: Edwin Love is a 53 y.o. male presenting with acute GI bleed. PMH is significant for alcohol abuse, history of GI bleeding, HTN, CKD, HFpEF (grade 2 DD), unspecified psychiatric disorder.  Acute blood loss anemia from acute GI bleed: Hgb 6.2 > 6.8 while 2nd unit of PRBCs is still infusing. He is hemodynamically stable. Presumably due to gastrointestinal varices related to alcoholic cirrhosis.   - Admit to SDU for close monitoring of hgb, VS's, and additional transfusions of RBCs  - To endoscopy per GI  Cirrhosis, presumed alcoholic: Noted on CT abd/pelvis with non-specific nodularity. EtOH 131 on arrival and laboratory sequelae of cirrhosis noted (thrombocytopenia, hypoprothrombinemia). MELD: 20; discriminant function: 29.38 (if using ULN of PT control) - Check HIV, hepatitis panel - CIWA - Monitor CBC, CMP  HTN: Hold home antihypertensives: norvasc 5mg , coreg 25mg  BID, lisinopril 20mg . Unspecified psychiatric disorder: Holding invega for now and sedating medications  FEN/GI: D5 1/2NS w/20KCl @100cc /hr as pt appears euvolemic, NPO Prophylaxis: Protonix gtt, SCDs  Disposition: Admit to FMTS, Dr. Erin Hearing attending, on SDU  History of Present Illness: Edwin Love is a 53 y.o. male presenting by EMS after being found by family with large volume BRBPR.   The history is provided by patient as well as medical record. Mr. Andreoni was found by family this morning in his bed surrounded by blood. He is unable to report when this  started, though there was blood on the bathroom floor as well. There was an odor of alcohol and he has a history of heavy drinking. EMS reports hypotension at the scene, though he remained responsive on arrival where he was resuscitated with IVF and 2u PRBCs. He was not given vitamin K. GI has seen him. His vital signs remained stable, hgb stable, and he is in transport to endoscopy at the time of my interview.   He denies symptoms of chest pain, shortness of breath and palpitations, but feels weak and has dull vague back pain. Reports a history of "psych problems" for which he is treated with 1 medicine he can't remember from [facility]. He cannot remember the name of his PCP.   Review Of Systems: Per HPI Otherwise 12 point review of systems was performed and was unremarkable.  Patient Active Problem List   Diagnosis Date Noted  . Acute posthemorrhagic anemia 06/24/2014  . Blood in stool 06/24/2014  . Alcoholic cirrhosis 93/23/5573  . Incarcerated inguinal hernia 02/08/2014  . Chest pain 01/29/2014  . Tachycardia 01/29/2014  . CKD (chronic kidney disease) stage 3, GFR 30-59 ml/min 01/29/2014  . Tobacco abuse 01/29/2014  . Depression 01/29/2014   Past Medical History: Past Medical History  Diagnosis Date  . Hypertension   . Back pain   . Renal disorder     CKD stage 3 noted in 02/2014 H & P  . Anemia 01/2014  . LVH (left ventricular hypertrophy) 02/2014    grade 2 diastolic dysfunction.   . Ankle fracture, left 10/2008    non surgical mgt.   . Thrombocytopenia 06/2014  . Coagulopathy 06/2014   Past Surgical History: Past Surgical History  Procedure Laterality Date  . Eye surgery Left 1990's    Foreign body  . Inguinal hernia repair Right 02/08/2014    Procedure: HERNIA REPAIR INGUINAL INCARCERATED;  Surgeon: Edward Jolly, MD;  Location: [place];  Service: General;  Laterality: Right;  . Insertion of mesh Right 02/08/2014    Procedure: INSERTION OF MESH;  Surgeon: Edward Jolly, MD;  Location: [place];  Service: General;  Laterality: Right;  . Hernia repair     Social History: History  Substance Use Topics  . Smoking status: Current Every Day Smoker -- 0.50 packs/day for 34 years    Types: Cigarettes  . Smokeless tobacco: Not on file     Comment: Smoke since 53 years old  . Alcohol Use: Yes     Comment: 1-2 40oz daily   Please also refer to relevant sections of EMR.  Family History: Family History  Problem Relation Age of Onset  . Diabetes Brother     Mother too  . Hypertension      family  . Sickle cell anemia Sister    Allergies and Medications: No Known Allergies No current facility-administered medications on file prior to encounter.   Current Outpatient Prescriptions on File Prior to Encounter  Medication Sig Dispense Refill  . amLODipine (NORVASC) 5 MG tablet Take 1 tablet (5 mg total) by mouth daily.  30 tablet  3  . carvedilol (COREG) 25 MG tablet Take 1 tablet (25 mg total) by mouth 2 (two) times daily.  60 tablet  3  . citalopram (CELEXA) 20 MG tablet Take 20 mg by mouth daily.      . hydrOXYzine (ATARAX/VISTARIL) 50 MG tablet Take 50 mg by mouth daily at 10 pm.      . ibuprofen (ADVIL,MOTRIN) 200 MG tablet Take 400 mg by mouth every 6 (six) hours as needed for moderate pain.      Marland Kitchen lisinopril (PRINIVIL,ZESTRIL) 10 MG tablet Take 2 tablets (20 mg total) by mouth daily.  60 tablet  3  . paliperidone (INVEGA) 6 MG 24 hr tablet Take 6 mg by mouth daily.        Objective: BP 161/90  Pulse 98  Temp(Src) 98.4 F (36.9 C) (Oral)  Resp 17  Ht 5\' 9"  (1.753 m)  Wt 190 lb (86.183 kg)  BMI 28.05 kg/m2  SpO2 100% Exam: General: Ill-appearing, drowsy, pale 53 y.o. male HEENT: NCAT, MMM, enucleated left eye Cardiovascular: Regular rate, 1+ flow murmur Respiratory: Non-labored, 100% on O2, CTA anteriorly Abdomen: +BS, + easily reducible umbilical hernia, NT, ND, soft Extremities: No obvious deformities Skin: Pale, no wounds  noted Neuro: Drowsy, poorly responsive and slowly speaking. +Oriented. No focal deficits in strength (diffusely weak)  Labs and Imaging: CBC BMET   Recent Labs Lab 06/24/14 1100 06/24/14 1114  WBC 8.5  --   HGB 6.2* 6.8*  HCT 18.2* 20.0*  PLT 94*  --     Recent Labs Lab 06/24/14 1100 06/24/14 1114  NA 142 143  K 4.3 4.1  CL 110 114*  CO2 11*  --   BUN 15 14  CREATININE 1.61* 1.90*  GLUCOSE 130* 124*  CALCIUM 7.0*  --      Component Value Date   INR 1.87* 06/24/2014  Troponin, poc: 0.04 EtOH: 131 ECG: regular rate, sinus rhythm with premature atrial and ventricular beats, no ST segment changes, QTc prolonged.   CT ABDOMEN AND PELVIS WITH CONTRAST  1. Liver appears cirrhotic. Multiple new hepatic lesions are  noted.  Differential diagnosis includes regenerating nodules, metastatic  disease, multifocal hepatocellular carcinoma.  2. Partial thrombosis of the portal vein.  3. Portal hypertension with multiple varices.  4. Periportal adenopathy cannot be excluded.  5. Umbilical hernia with herniation of small bowel. Partial small  bowel obstruction cannot be excluded.  6. Extensive small bowel wall edema, although this could be related  to partial small bowel obstruction, hyperproteinemia and vascular  congestion could also result in bowel wall edema. No pneumatosis  intestinalis. No portal venous air. No free air.  Vance Gather, MD 06/24/2014, 3:56 PM PGY-2, [facility] pager: [phone], text pages welcome

## 2014-06-24 NOTE — ED Notes (Signed)
Rectal check by MD- blood at rectum.

## 2014-06-25 ENCOUNTER — Encounter (HOSPITAL_COMMUNITY): Payer: Self-pay | Admitting: Gastroenterology

## 2014-06-25 ENCOUNTER — Encounter (HOSPITAL_COMMUNITY): Payer: Medicaid Other | Admitting: Anesthesiology

## 2014-06-25 ENCOUNTER — Inpatient Hospital Stay (HOSPITAL_COMMUNITY): Payer: Medicaid Other

## 2014-06-25 ENCOUNTER — Inpatient Hospital Stay (HOSPITAL_COMMUNITY): Payer: Medicaid Other | Admitting: Anesthesiology

## 2014-06-25 ENCOUNTER — Encounter (HOSPITAL_COMMUNITY)
Admission: EM | Disposition: A | Discharge status: Rehabilitation Facility | Payer: Self-pay | Source: Home / Self Care | Attending: Family Medicine

## 2014-06-25 ENCOUNTER — Encounter (HOSPITAL_COMMUNITY)
Admission: EM | Disposition: A | Discharge status: Rehabilitation Facility | Payer: Medicaid Other | Source: Home / Self Care | Attending: Family Medicine

## 2014-06-25 DIAGNOSIS — K922 Gastrointestinal hemorrhage, unspecified: Secondary | ICD-10-CM

## 2014-06-25 DIAGNOSIS — I959 Hypotension, unspecified: Secondary | ICD-10-CM | POA: Insufficient documentation

## 2014-06-25 DIAGNOSIS — K7030 Alcoholic cirrhosis of liver without ascites: Secondary | ICD-10-CM

## 2014-06-25 DIAGNOSIS — K7460 Unspecified cirrhosis of liver: Secondary | ICD-10-CM

## 2014-06-25 DIAGNOSIS — K625 Hemorrhage of anus and rectum: Secondary | ICD-10-CM

## 2014-06-25 DIAGNOSIS — R578 Other shock: Secondary | ICD-10-CM

## 2014-06-25 DIAGNOSIS — I8500 Esophageal varices without bleeding: Secondary | ICD-10-CM

## 2014-06-25 DIAGNOSIS — N183 Chronic kidney disease, stage 3 unspecified: Secondary | ICD-10-CM

## 2014-06-25 DIAGNOSIS — J984 Other disorders of lung: Secondary | ICD-10-CM

## 2014-06-25 DIAGNOSIS — D62 Acute posthemorrhagic anemia: Secondary | ICD-10-CM

## 2014-06-25 DIAGNOSIS — K649 Unspecified hemorrhoids: Secondary | ICD-10-CM

## 2014-06-25 HISTORY — PX: HEMORRHOID SURGERY: SHX153

## 2014-06-25 HISTORY — PX: COLONOSCOPY: SHX5424

## 2014-06-25 LAB — POCT I-STAT 7, (LYTES, BLD GAS, ICA,H+H)
ACID-BASE DEFICIT: 20 mmol/L — AB (ref 0.0–2.0)
Bicarbonate: 9.8 mEq/L — ABNORMAL LOW (ref 20.0–24.0)
Calcium, Ion: 0.92 mmol/L — ABNORMAL LOW (ref 1.12–1.23)
HCT: 15 % — ABNORMAL LOW (ref 39.0–52.0)
HEMOGLOBIN: 5.1 g/dL — AB (ref 13.0–17.0)
O2 Saturation: 100 %
PH ART: 6.982 — AB (ref 7.350–7.450)
POTASSIUM: 4.2 meq/L (ref 3.7–5.3)
Sodium: 144 mEq/L (ref 137–147)
TCO2: 11 mmol/L (ref 0–100)
pCO2 arterial: 41.2 mmHg (ref 35.0–45.0)
pO2, Arterial: 256 mmHg — ABNORMAL HIGH (ref 80.0–100.0)

## 2014-06-25 LAB — CBC
HEMATOCRIT: 13.6 % — AB (ref 39.0–52.0)
HEMATOCRIT: 20.8 % — AB (ref 39.0–52.0)
Hemoglobin: 4.5 g/dL — CL (ref 13.0–17.0)
Hemoglobin: 7.4 g/dL — ABNORMAL LOW (ref 13.0–17.0)
MCH: 27.1 pg (ref 26.0–34.0)
MCH: 29.5 pg (ref 26.0–34.0)
MCHC: 33.1 g/dL (ref 30.0–36.0)
MCHC: 35.6 g/dL (ref 30.0–36.0)
MCV: 81.9 fL (ref 78.0–100.0)
MCV: 82.9 fL (ref 78.0–100.0)
Platelets: 62 10*3/uL — ABNORMAL LOW (ref 150–400)
Platelets: 108 10*3/uL — ABNORMAL LOW (ref 150–400)
RBC: 1.66 MIL/uL — AB (ref 4.22–5.81)
RBC: 2.51 MIL/uL — AB (ref 4.22–5.81)
RDW: 17.1 % — AB (ref 11.5–15.5)
RDW: 15 % (ref 11.5–15.5)
WBC: 10.7 10*3/uL — ABNORMAL HIGH (ref 4.0–10.5)
WBC: 16.9 10*3/uL — AB (ref 4.0–10.5)

## 2014-06-25 LAB — POCT I-STAT 3, ART BLOOD GAS (G3+)
ACID-BASE DEFICIT: 17 mmol/L — AB (ref 0.0–2.0)
Acid-base deficit: 13 mmol/L — ABNORMAL HIGH (ref 0.0–2.0)
Acid-base deficit: 13 mmol/L — ABNORMAL HIGH (ref 0.0–2.0)
BICARBONATE: 9.2 meq/L — AB (ref 20.0–24.0)
BICARBONATE: 12.5 meq/L — AB (ref 20.0–24.0)
Bicarbonate: 12.3 mEq/L — ABNORMAL LOW (ref 20.0–24.0)
O2 SAT: 99 %
O2 SAT: 99 %
O2 SAT: 100 %
PCO2 ART: 25.7 mmHg — AB (ref 35.0–45.0)
PH ART: 7.314 — AB (ref 7.350–7.450)
PO2 ART: 164 mmHg — AB (ref 80.0–100.0)
Patient temperature: 98.8
TCO2: 10 mmol/L (ref 0–100)
TCO2: 13 mmol/L (ref 0–100)
TCO2: 13 mmol/L (ref 0–100)
pCO2 arterial: 21.1 mmHg — ABNORMAL LOW (ref 35.0–45.0)
pCO2 arterial: 24.4 mmHg — ABNORMAL LOW (ref 35.0–45.0)
pH, Arterial: 7.245 — ABNORMAL LOW (ref 7.350–7.450)
pH, Arterial: 7.29 — ABNORMAL LOW (ref 7.350–7.450)
pO2, Arterial: 188 mmHg — ABNORMAL HIGH (ref 80.0–100.0)
pO2, Arterial: 214 mmHg — ABNORMAL HIGH (ref 80.0–100.0)

## 2014-06-25 LAB — COMPREHENSIVE METABOLIC PANEL
ALBUMIN: 2 g/dL — AB (ref 3.5–5.2)
ALT: 372 U/L — ABNORMAL HIGH (ref 0–53)
AST: 2032 U/L — ABNORMAL HIGH (ref 0–37)
Alkaline Phosphatase: 65 U/L (ref 39–117)
Anion gap: 27 — ABNORMAL HIGH (ref 5–15)
BILIRUBIN TOTAL: 2.3 mg/dL — AB (ref 0.3–1.2)
BUN: 26 mg/dL — ABNORMAL HIGH (ref 6–23)
CO2: 11 mEq/L — ABNORMAL LOW (ref 19–32)
CREATININE: 2.89 mg/dL — AB (ref 0.50–1.35)
Calcium: 6.6 mg/dL — ABNORMAL LOW (ref 8.4–10.5)
Chloride: 107 mEq/L (ref 96–112)
GFR calc Af Amer: 27 mL/min — ABNORMAL LOW (ref >90)
GFR calc non Af Amer: 23 mL/min — ABNORMAL LOW (ref >90)
GLUCOSE: 122 mg/dL — AB (ref 70–99)
Potassium: 4.4 mEq/L (ref 3.7–5.3)
Sodium: 145 mEq/L (ref 137–147)
TOTAL PROTEIN: 3.7 g/dL — AB (ref 6.0–8.3)

## 2014-06-25 LAB — BASIC METABOLIC PANEL
Anion gap: 22 — ABNORMAL HIGH (ref 5–15)
Anion gap: 22 — ABNORMAL HIGH (ref 5–15)
BUN: 21 mg/dL (ref 6–23)
BUN: 25 mg/dL — AB (ref 6–23)
CHLORIDE: 108 meq/L (ref 96–112)
CO2: 8 meq/L — AB (ref 19–32)
CO2: 10 meq/L — AB (ref 19–32)
CREATININE: 1.97 mg/dL — AB (ref 0.50–1.35)
CREATININE: 2.53 mg/dL — AB (ref 0.50–1.35)
Calcium: 6.5 mg/dL — ABNORMAL LOW (ref 8.4–10.5)
Calcium: 6.4 mg/dL — CL (ref 8.4–10.5)
Chloride: 116 mEq/L — ABNORMAL HIGH (ref 96–112)
GFR calc Af Amer: 32 mL/min — ABNORMAL LOW (ref >90)
GFR calc non Af Amer: 37 mL/min — ABNORMAL LOW (ref >90)
GFR calc non Af Amer: 27 mL/min — ABNORMAL LOW (ref >90)
GFR, EST AFRICAN AMERICAN: 43 mL/min — AB (ref >90)
GLUCOSE: 138 mg/dL — AB (ref 70–99)
Glucose, Bld: 153 mg/dL — ABNORMAL HIGH (ref 70–99)
Potassium: 5.4 mEq/L — ABNORMAL HIGH (ref 3.7–5.3)
Potassium: 6.2 mEq/L — ABNORMAL HIGH (ref 3.7–5.3)
Sodium: 146 mEq/L (ref 137–147)
Sodium: 140 mEq/L (ref 137–147)

## 2014-06-25 LAB — PREPARE RBC (CROSSMATCH)

## 2014-06-25 LAB — CBC WITH DIFFERENTIAL/PLATELET
BASOS PCT: 0 % (ref 0–1)
Basophils Absolute: 0 10*3/uL (ref 0.0–0.1)
EOS ABS: 0 10*3/uL (ref 0.0–0.7)
Eosinophils Relative: 0 % (ref 0–5)
HCT: 22.3 % — ABNORMAL LOW (ref 39.0–52.0)
HEMOGLOBIN: 7.8 g/dL — AB (ref 13.0–17.0)
Lymphocytes Relative: 15 % (ref 12–46)
Lymphs Abs: 0.6 10*3/uL — ABNORMAL LOW (ref 0.7–4.0)
MCH: 28.6 pg (ref 26.0–34.0)
MCHC: 35 g/dL (ref 30.0–36.0)
MCV: 81.7 fL (ref 78.0–100.0)
Monocytes Absolute: 0.1 10*3/uL (ref 0.1–1.0)
Monocytes Relative: 2 % — ABNORMAL LOW (ref 3–12)
NEUTROS PCT: 83 % — AB (ref 43–77)
Neutro Abs: 3.3 10*3/uL (ref 1.7–7.7)
Platelets: 31 10*3/uL — ABNORMAL LOW (ref 150–400)
RBC: 2.73 MIL/uL — ABNORMAL LOW (ref 4.22–5.81)
RDW: 15.2 % (ref 11.5–15.5)
WBC: 4 10*3/uL (ref 4.0–10.5)

## 2014-06-25 LAB — APTT
APTT: 36 s (ref 24–37)
APTT: 46 s — AB (ref 24–37)

## 2014-06-25 LAB — PROTIME-INR
INR: 2.33 — AB (ref 0.00–1.49)
INR: 2.33 — ABNORMAL HIGH (ref 0.00–1.49)
INR: 2.35 — AB (ref 0.00–1.49)
PROTHROMBIN TIME: 25.6 s — AB (ref 11.6–15.2)
Prothrombin Time: 25.6 seconds — ABNORMAL HIGH (ref 11.6–15.2)
Prothrombin Time: 25.7 seconds — ABNORMAL HIGH (ref 11.6–15.2)

## 2014-06-25 LAB — GLUCOSE, CAPILLARY: Glucose-Capillary: 115 mg/dL — ABNORMAL HIGH (ref 70–99)

## 2014-06-25 LAB — PHOSPHORUS: Phosphorus: 5 mg/dL — ABNORMAL HIGH (ref 2.3–4.6)

## 2014-06-25 LAB — TRIGLYCERIDES: Triglycerides: 62 mg/dL (ref <150)

## 2014-06-25 LAB — LACTIC ACID, PLASMA: Lactic Acid, Venous: 6.9 mmol/L — ABNORMAL HIGH (ref 0.5–2.2)

## 2014-06-25 LAB — TROPONIN I: Troponin I: <0.3 ng/mL (ref <0.30)

## 2014-06-25 LAB — MAGNESIUM: Magnesium: 1.6 mg/dL (ref 1.5–2.5)

## 2014-06-25 LAB — BLOOD PRODUCT ORDER (VERBAL) VERIFICATION

## 2014-06-25 SURGERY — HEMORRHOIDECTOMY
Anesthesia: General | Site: Rectum

## 2014-06-25 SURGERY — COLONOSCOPY
Anesthesia: Moderate Sedation

## 2014-06-25 MED ORDER — DEXTROSE 5 % IV SOLN
2.0000 ug/min | INTRAVENOUS | Status: DC
  Administered 2014-06-25: 30 ug/min via INTRAVENOUS
  Administered 2014-06-26: 15 ug/min via INTRAVENOUS
  Administered 2014-06-26: 25 ug/min via INTRAVENOUS
  Filled 2014-06-25 (×4): qty 16

## 2014-06-25 MED ORDER — CEFTRIAXONE SODIUM 1 G IJ SOLR
1.0000 g | INTRAMUSCULAR | Status: DC
  Administered 2014-06-25 – 2014-06-28 (×4): 1 g via INTRAVENOUS
  Filled 2014-06-25 (×4): qty 10

## 2014-06-25 MED ORDER — SODIUM BICARBONATE 4.2 % IV SOLN
INTRAVENOUS | Status: DC | PRN
  Administered 2014-06-25: 50 meq via INTRAVENOUS

## 2014-06-25 MED ORDER — MIDAZOLAM HCL 5 MG/5ML IJ SOLN
INTRAMUSCULAR | Status: DC | PRN
  Administered 2014-06-25: 2 mg via INTRAVENOUS

## 2014-06-25 MED ORDER — HYDROCORTISONE NA SUCCINATE PF 100 MG IJ SOLR
50.0000 mg | Freq: Four times a day (QID) | INTRAMUSCULAR | Status: DC
  Administered 2014-06-25 – 2014-06-27 (×7): 50 mg via INTRAVENOUS
  Filled 2014-06-25 (×11): qty 1

## 2014-06-25 MED ORDER — NITROGLYCERIN 1 MG/10 ML FOR IR/CATH LAB
INTRA_ARTERIAL | Status: AC
  Administered 2014-06-25 (×2): 100 ug
  Filled 2014-06-25: qty 10

## 2014-06-25 MED ORDER — SODIUM CHLORIDE 0.9 % IV SOLN
0.0000 mg/h | INTRAVENOUS | Status: DC
  Administered 2014-06-25: 2 mg/h via INTRAVENOUS
  Administered 2014-06-26 – 2014-06-27 (×2): 4 mg/h via INTRAVENOUS
  Filled 2014-06-25 (×3): qty 10

## 2014-06-25 MED ORDER — FENTANYL CITRATE 0.05 MG/ML IJ SOLN
INTRAMUSCULAR | Status: AC
Start: 2014-06-25 — End: 2014-06-25
  Administered 2014-06-25: 100 ug
  Filled 2014-06-25: qty 2

## 2014-06-25 MED ORDER — MIDAZOLAM HCL 2 MG/2ML IJ SOLN
INTRAMUSCULAR | Status: AC
  Filled 2014-06-25: qty 2

## 2014-06-25 MED ORDER — ACETAMINOPHEN 650 MG RE SUPP: 650.0000 mg | Freq: Four times a day (QID) | RECTAL | Status: DC | PRN

## 2014-06-25 MED ORDER — SODIUM BICARBONATE 8.4 % IV SOLN
INTRAVENOUS | Status: DC
  Administered 2014-06-25 – 2014-06-27 (×6): via INTRAVENOUS
  Filled 2014-06-25 (×11): qty 150

## 2014-06-25 MED ORDER — VASOPRESSIN 20 UNIT/ML IJ SOLN
100.0000 [IU] | INTRAVENOUS | Status: DC | PRN
  Administered 2014-06-25: .3 [IU]/min via INTRAVENOUS

## 2014-06-25 MED ORDER — MIDAZOLAM HCL 2 MG/2ML IJ SOLN
INTRAMUSCULAR | Status: AC
  Administered 2014-06-25: 2 mg
  Filled 2014-06-25: qty 2

## 2014-06-25 MED ORDER — MIDAZOLAM BOLUS VIA INFUSION
1.0000 mg | INTRAVENOUS | Status: DC | PRN
  Filled 2014-06-25: qty 2

## 2014-06-25 MED ORDER — KCL IN DEXTROSE-NACL 20-5-0.45 MEQ/L-%-% IV SOLN
INTRAVENOUS | Status: DC
Start: 2014-06-25 — End: 2014-06-25
  Filled 2014-06-25 (×2): qty 1000

## 2014-06-25 MED ORDER — FENTANYL CITRATE 0.05 MG/ML IJ SOLN: 100.0000 ug | INTRAMUSCULAR | Status: DC | PRN

## 2014-06-25 MED ORDER — FENTANYL CITRATE 0.05 MG/ML IJ SOLN
INTRAMUSCULAR | Status: AC
  Filled 2014-06-25: qty 5

## 2014-06-25 MED ORDER — CHLORHEXIDINE GLUCONATE 0.12 % MT SOLN
15.0000 mL | Freq: Two times a day (BID) | OROMUCOSAL | Status: DC
  Administered 2014-06-25 – 2014-07-05 (×21): 15 mL via OROMUCOSAL
  Filled 2014-06-25 (×22): qty 15

## 2014-06-25 MED ORDER — ACETAMINOPHEN 325 MG PO TABS: 650.0000 mg | ORAL_TABLET | ORAL | Status: DC | PRN

## 2014-06-25 MED ORDER — FENTANYL BOLUS VIA INFUSION
50.0000 ug | INTRAVENOUS | Status: DC | PRN
  Filled 2014-06-25: qty 100

## 2014-06-25 MED ORDER — ROCURONIUM BROMIDE 50 MG/5ML IV SOLN
INTRAVENOUS | Status: AC
  Filled 2014-06-25: qty 1

## 2014-06-25 MED ORDER — OCTREOTIDE LOAD VIA INFUSION
50.0000 ug | Freq: Once | INTRAVENOUS | Status: AC
  Administered 2014-06-25: 50 ug via INTRAVENOUS
  Filled 2014-06-25: qty 25

## 2014-06-25 MED ORDER — MIDAZOLAM HCL 5 MG/ML IJ SOLN
INTRAMUSCULAR | Status: AC
  Filled 2014-06-25: qty 1

## 2014-06-25 MED ORDER — FENTANYL CITRATE 0.05 MG/ML IJ SOLN
INTRAMUSCULAR | Status: AC
  Filled 2014-06-25: qty 2

## 2014-06-25 MED ORDER — FENTANYL CITRATE 0.05 MG/ML IJ SOLN
0.0000 ug/h | INTRAMUSCULAR | Status: DC
  Administered 2014-06-25: 50 ug/h via INTRAVENOUS
  Administered 2014-06-26: 150 ug/h via INTRAVENOUS
  Administered 2014-06-27 (×2): 200 ug/h via INTRAVENOUS
  Filled 2014-06-25 (×5): qty 50

## 2014-06-25 MED ORDER — SODIUM CHLORIDE 0.9 % IV SOLN
0.0300 [IU]/min | INTRAVENOUS | Status: DC
  Administered 2014-06-25 – 2014-06-26 (×3): 0.03 [IU]/min via INTRAVENOUS
  Filled 2014-06-25 (×3): qty 2.5

## 2014-06-25 MED ORDER — BIOTENE DRY MOUTH MT LIQD
15.0000 mL | Freq: Two times a day (BID) | OROMUCOSAL | Status: DC
  Administered 2014-06-25 – 2014-07-05 (×20): 15 mL via OROMUCOSAL

## 2014-06-25 MED ORDER — SODIUM CHLORIDE 0.9 % IV SOLN
1.0000 g | Freq: Once | INTRAVENOUS | Status: AC
  Administered 2014-06-25: 1 g via INTRAVENOUS
  Filled 2014-06-25: qty 10

## 2014-06-25 MED ORDER — MORPHINE SULFATE 2 MG/ML IJ SOLN: 2.0000 mg | INTRAMUSCULAR | Status: DC | PRN

## 2014-06-25 MED ORDER — IODIXANOL 320 MG/ML IV SOLN
100.0000 mL | Freq: Once | INTRAVENOUS | Status: AC | PRN
  Administered 2014-06-25: 100 mL via INTRAVENOUS

## 2014-06-25 MED ORDER — ALBUMIN HUMAN 5 % IV SOLN
INTRAVENOUS | Status: DC | PRN
  Administered 2014-06-25: 17:00:00 via INTRAVENOUS

## 2014-06-25 MED ORDER — TRANEXAMIC ACID 100 MG/ML IV SOLN
1000.0000 mg | Freq: Once | INTRAVENOUS | Status: AC
  Administered 2014-06-25: 1000 mg via INTRAVENOUS
  Filled 2014-06-25: qty 10

## 2014-06-25 MED ORDER — ACETAMINOPHEN 325 MG PO TABS: 650.0000 mg | ORAL_TABLET | Freq: Four times a day (QID) | ORAL | Status: DC | PRN

## 2014-06-25 MED ORDER — FENTANYL CITRATE 0.05 MG/ML IJ SOLN: 50.0000 ug | Freq: Once | INTRAMUSCULAR | Status: DC

## 2014-06-25 MED ORDER — PROPOFOL 10 MG/ML IV EMUL
0.0000 ug/kg/min | INTRAVENOUS | Status: DC
  Administered 2014-06-25: 9.861 ug/kg/min via INTRAVENOUS
  Filled 2014-06-25: qty 100

## 2014-06-25 MED ORDER — VITAMIN K1 10 MG/ML IJ SOLN
10.0000 mg | Freq: Every day | INTRAVENOUS | Status: AC
  Administered 2014-06-25 – 2014-06-27 (×3): 10 mg via INTRAVENOUS
  Filled 2014-06-25 (×3): qty 1

## 2014-06-25 MED ORDER — PEG 3350-KCL-NA BICARB-NACL 420 G PO SOLR
4000.0000 mL | Freq: Once | ORAL | Status: AC
  Administered 2014-06-25: 4000 mL via ORAL
  Filled 2014-06-25: qty 4000

## 2014-06-25 MED ORDER — PNEUMOCOCCAL VAC POLYVALENT 25 MCG/0.5ML IJ INJ
0.5000 mL | INJECTION | INTRAMUSCULAR | Status: AC
  Administered 2014-06-26: 0.5 mL via INTRAMUSCULAR
  Filled 2014-06-25: qty 0.5

## 2014-06-25 MED ORDER — DIPHENHYDRAMINE HCL 50 MG/ML IJ SOLN: 25.0000 mg | Freq: Four times a day (QID) | INTRAMUSCULAR | Status: DC | PRN

## 2014-06-25 MED ORDER — ROCURONIUM BROMIDE 100 MG/10ML IV SOLN
INTRAVENOUS | Status: DC | PRN
  Administered 2014-06-25: 50 mg via INTRAVENOUS

## 2014-06-25 MED ORDER — VITAMIN K1 10 MG/ML IJ SOLN
2.5000 mg | Freq: Once | INTRAVENOUS | Status: DC
  Filled 2014-06-25: qty 0.25

## 2014-06-25 MED ORDER — NOREPINEPHRINE BITARTRATE 1 MG/ML IV SOLN
4000.0000 ug | INTRAVENOUS | Status: DC | PRN
  Administered 2014-06-25: 70 ug/kg/min via INTRAVENOUS

## 2014-06-25 MED ORDER — SODIUM CHLORIDE 0.9 % IV SOLN
INTRAVENOUS | Status: DC
  Administered 2014-06-25: 20:00:00 via INTRAVENOUS

## 2014-06-25 MED ORDER — SODIUM CHLORIDE 0.9 % IV SOLN
25.0000 ug/h | INTRAVENOUS | Status: DC
  Administered 2014-06-25 – 2014-06-27 (×5): 25 ug/h via INTRAVENOUS
  Filled 2014-06-25 (×13): qty 1

## 2014-06-25 MED ORDER — SODIUM CHLORIDE 0.9 % IJ SOLN
3.0000 mL | Freq: Two times a day (BID) | INTRAMUSCULAR | Status: DC
  Administered 2014-06-26 – 2014-06-27 (×2): 3 mL via INTRAVENOUS
  Administered 2014-06-27: 6 mL via INTRAVENOUS
  Administered 2014-06-28 (×2): 3 mL via INTRAVENOUS
  Administered 2014-06-28: 10 mL via INTRAVENOUS
  Administered 2014-06-28: 6 mL via INTRAVENOUS
  Administered 2014-06-29 – 2014-07-05 (×11): 3 mL via INTRAVENOUS

## 2014-06-25 MED ORDER — ETHANOLAMINE OLEATE 5 % IV SOLN
INTRAVENOUS | Status: DC | PRN
  Administered 2014-06-25: 2 mL via SUBMUCOSAL

## 2014-06-25 MED FILL — Medication: Qty: 1 | Status: AC

## 2014-06-25 SURGICAL SUPPLY — 43 items
CANISTER SUCTION 2500CC (MISCELLANEOUS) ×3 IMPLANT
COVER SURGICAL LIGHT HANDLE (MISCELLANEOUS) ×3 IMPLANT
DECANTER SPIKE VIAL GLASS SM (MISCELLANEOUS) IMPLANT
DRAPE PROXIMA HALF (DRAPES) ×1 IMPLANT
DRAPE UTILITY 15X26 W/TAPE STR (DRAPE) ×2 IMPLANT
ELECT CAUTERY BLADE 6.4 (BLADE) ×1 IMPLANT
ELECT REM PT RETURN 9FT ADLT (ELECTROSURGICAL) ×3
ELECTRODE REM PT RTRN 9FT ADLT (ELECTROSURGICAL) ×1 IMPLANT
GAUZE SPONGE 4X4 16PLY XRAY LF (GAUZE/BANDAGES/DRESSINGS) ×1 IMPLANT
GLOVE BIO SURGEON STRL SZ7 (GLOVE) ×2 IMPLANT
GLOVE BIOGEL PI IND STRL 7.0 (GLOVE) IMPLANT
GLOVE BIOGEL PI INDICATOR 7.0 (GLOVE) ×2
GLOVE SURG SIGNA 7.5 PF LTX (GLOVE) ×3 IMPLANT
GOWN STRL REUS W/ TWL LRG LVL3 (GOWN DISPOSABLE) ×1 IMPLANT
GOWN STRL REUS W/ TWL XL LVL3 (GOWN DISPOSABLE) ×1 IMPLANT
GOWN STRL REUS W/TWL LRG LVL3 (GOWN DISPOSABLE) ×3
GOWN STRL REUS W/TWL XL LVL3 (GOWN DISPOSABLE) ×3
KIT BASIN OR (CUSTOM PROCEDURE TRAY) ×3 IMPLANT
KIT ROOM TURNOVER OR (KITS) ×3 IMPLANT
NDL HYPO 25GX1X1/2 BEV (NEEDLE) ×1 IMPLANT
NEEDLE HYPO 25GX1X1/2 BEV (NEEDLE) IMPLANT
NS IRRIG 1000ML POUR BTL (IV SOLUTION) ×3 IMPLANT
PACK LITHOTOMY IV (CUSTOM PROCEDURE TRAY) ×3 IMPLANT
PAD ARMBOARD 7.5X6 YLW CONV (MISCELLANEOUS) ×3 IMPLANT
PENCIL BUTTON HOLSTER BLD 10FT (ELECTRODE) ×1 IMPLANT
SPONGE GAUZE 4X4 12PLY (GAUZE/BANDAGES/DRESSINGS) ×1 IMPLANT
SPONGE HEMORRHOID 8X3CM (HEMOSTASIS) ×2 IMPLANT
SPONGE SURGIFOAM ABS GEL 100 (HEMOSTASIS) ×2 IMPLANT
SPONGE SURGIFOAM ABS GEL 100C (HEMOSTASIS) ×2 IMPLANT
SURGILUBE 2OZ TUBE FLIPTOP (MISCELLANEOUS) ×3 IMPLANT
SUT CHROMIC 2 0 SH (SUTURE) ×1 IMPLANT
SUT CHROMIC 3 0 PS 2 (SUTURE) ×2 IMPLANT
SUT VIC AB 2-0 CT1 27 (SUTURE) ×15
SUT VIC AB 2-0 CT1 TAPERPNT 27 (SUTURE) IMPLANT
SYR BULB 3OZ (MISCELLANEOUS) ×1 IMPLANT
SYR CONTROL 10ML LL (SYRINGE) ×1 IMPLANT
TOWEL OR 17X24 6PK STRL BLUE (TOWEL DISPOSABLE) ×3 IMPLANT
TOWEL OR 17X26 10 PK STRL BLUE (TOWEL DISPOSABLE) ×3 IMPLANT
TRAY PROCTOSCOPIC FIBER OPTIC (SET/KITS/TRAYS/PACK) IMPLANT
TUBE CONNECTING 12'X1/4 (SUCTIONS)
TUBE CONNECTING 12X1/4 (SUCTIONS) ×1 IMPLANT
UNDERPAD 30X30 INCONTINENT (UNDERPADS AND DIAPERS) ×3 IMPLANT
YANKAUER SUCT BULB TIP NO VENT (SUCTIONS) ×1 IMPLANT

## 2014-06-25 NOTE — Consult Note (Signed)
Edwin Love 09-03-1961  [id number].   Requesting MD: Dr. Kara Mead Chief Complaint/Reason for Consult: Lower GI bleed HPI: This is a 53 year old black male with a history of renal disease, grade 2 diastolic dysfunction, at least a Childs-Pugh C. cirrhotic who lives at home with his parents. He was found in a pool of blood by his parents last night. He was transported to [facility] emergency department. He was endoscoped last night with no source of bleed. He then had a nuclear medicine bleeding scan which was positive for likely in the sigmoid colon. He then had a selective angiogram done which revealed that his bleeding had ceased. He also had a CT scan completed which reveals cirrhosis of the liver with multiple new hepatic lesions which could be nodules, metastatic disease, multifocal hepatocellular carcinoma. 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 The patient had multiple bloody bowel movements overnight but has not had any so far this morning. He has been placed on octreotide with a plan for a colonoscopy this afternoon. We have been asked to evaluate the patient and follow along.  ROS unable to obtain as the patient is on the ventilator.  Family History  Problem Relation Age of Onset  . Diabetes Brother     Mother too  . Hypertension      family  . Sickle cell anemia Sister     Past Medical History  Diagnosis Date  . Hypertension   . Back pain   . Renal disorder     CKD stage 3 noted in 02/2014 [facility]  . Anemia 01/2014  . LVH (left ventricular hypertrophy) 02/2014    grade 2 diastolic dysfunction.   . Ankle fracture, left 10/2008    non surgical mgt.   . Thrombocytopenia 06/2014  . Coagulopathy 06/2014    Past Surgical History  Procedure Laterality Date  . Eye surgery Left 1990's    Foreign body  . Inguinal hernia repair Right 02/08/2014    Procedure: HERNIA REPAIR INGUINAL INCARCERATED;   Surgeon: Edward Jolly, MD;  Location: [place];  Service: General;  Laterality: Right;  . Insertion of mesh Right 02/08/2014    Procedure: INSERTION OF MESH;  Surgeon: Edward Jolly, MD;  Location: [place];  Service: General;  Laterality: Right;  . Hernia repair    . Esophagogastroduodenoscopy N/A 06/24/2014    Procedure: ESOPHAGOGASTRODUODENOSCOPY (EGD);  Surgeon: Inda Castle, MD;  Location: [place];  Service: Endoscopy;  Laterality: N/A;    Social History:  reports that he has been smoking Cigarettes.  He has a 17 pack-year smoking history. He does not have any smokeless tobacco history on file. He reports that he drinks alcohol. He reports that he uses illicit drugs (Marijuana and Cocaine).  Allergies: No Known Allergies  Medications Prior to Admission  Medication Sig Dispense Refill  . amLODipine (NORVASC) 5 MG tablet Take 1 tablet (5 mg total) by mouth daily.  30 tablet  3  . carvedilol (COREG) 25 MG tablet Take 1 tablet (25 mg total) by mouth 2 (two) times daily.  60 tablet  3  . citalopram (CELEXA) 20 MG tablet Take 20 mg by mouth daily.      . hydrOXYzine (ATARAX/VISTARIL) 50 MG tablet Take 50 mg by mouth daily at 10 pm.      . ibuprofen (ADVIL,MOTRIN) 200 MG tablet Take 400 mg by mouth every 6 (six) hours as  needed for moderate pain.      Marland Kitchen lisinopril (PRINIVIL,ZESTRIL) 10 MG tablet Take 2 tablets (20 mg total) by mouth daily.  60 tablet  3  . paliperidone (INVEGA) 6 MG 24 hr tablet Take 6 mg by mouth daily.        Blood pressure 110/55, pulse 77, temperature 98.7 F (37.1 C), temperature source Oral, resp. rate 22, height $RemoveBe'5\' 9"'wzmkxysFo$  (1.753 m), weight 187 lb 6.3 oz (85 kg), SpO2 100.00%. Physical Exam: General: sedated black male who is laying in bed in NAD but on the ventilator HEENT: head is normocephalic, atraumatic. Ears and nose without any masses or lesions.  ET tube in place Heart: regular, rate, and rhythm.  Normal s1,s2. No obvious murmurs, gallops, or rubs  noted.  Palpable radial and pedal pulses bilaterally Lungs: CTAB, no wheezes, rhonchi, or rales noted.  Respiratory effort nonlabored Abd: soft, NT, ND, +BS, no masses or organomegaly, small reducible umbilical hernia noted MS: all 4 extremities are symmetrical with no cyanosis, clubbing, or edema. Skin: warm and dry with no masses, lesions, or rashes Psych: Unable to evaluate as he is sedated on the ventilator    Results for orders placed during the hospital encounter of 06/24/14 (from the past 48 hour(s))  CBC WITH DIFFERENTIAL     Status: Abnormal   Collection Time    06/24/14 11:00 AM      Result Value Ref Range   WBC 8.5  4.0 - 10.5 K/uL   RBC 2.26 (*) 4.22 - 5.81 MIL/uL   Hemoglobin 6.2 (*) 13.0 - 17.0 g/dL   Comment: REPEATED TO VERIFY     CRITICAL RESULT CALLED TO, READ BACK BY AND VERIFIED WITH:     B.LEAK,RN 1203 06/24/14 CLARK,S   HCT 18.2 (*) 39.0 - 52.0 %   MCV 80.5  78.0 - 100.0 fL   MCH 27.4  26.0 - 34.0 pg   MCHC 34.1  30.0 - 36.0 g/dL   RDW 20.7 (*) 11.5 - 15.5 %   Platelets 94 (*) 150 - 400 K/uL   Comment: PLATELET COUNT CONFIRMED BY SMEAR   Neutrophils Relative % 73  43 - 77 %   Comment: CORRECTED ON 07/16 AT 1249: PREVIOUSLY REPORTED AS 74   Neutro Abs 6.2  1.7 - 7.7 K/uL   Comment: CORRECTED ON 07/16 AT 1249: PREVIOUSLY REPORTED AS 6.3   Lymphocytes Relative 17  12 - 46 %   Lymphs Abs 1.4  0.7 - 4.0 K/uL   Monocytes Relative 8  3 - 12 %   Monocytes Absolute 0.7  0.1 - 1.0 K/uL   Eosinophils Relative 1  0 - 5 %   Eosinophils Absolute 0.1  0.0 - 0.7 K/uL   Basophils Relative 1  0 - 1 %   Basophils Absolute 0.1  0.0 - 0.1 K/uL   Comment: CORRECTED ON 07/16 AT 1249: PREVIOUSLY REPORTED AS 0.0   WBC Morphology TOXIC GRANULATION     RBC Morphology POLYCHROMASIA PRESENT     Comment: BURR CELLS  COMPREHENSIVE METABOLIC PANEL     Status: Abnormal   Collection Time    06/24/14 11:00 AM      Result Value Ref Range   Sodium 142  137 - 147 mEq/L   Potassium 4.3   3.7 - 5.3 mEq/L   Chloride 110  96 - 112 mEq/L   CO2 11 (*) 19 - 32 mEq/L   Glucose, Bld 130 (*) 70 - 99 mg/dL   BUN 15  6 - 23 mg/dL   Creatinine, Ser 1.61 (*) 0.50 - 1.35 mg/dL   Calcium 7.0 (*) 8.4 - 10.5 mg/dL   Total Protein 4.7 (*) 6.0 - 8.3 g/dL   Albumin 1.4 (*) 3.5 - 5.2 g/dL   AST 71 (*) 0 - 37 U/L   ALT 20  0 - 53 U/L   Alkaline Phosphatase 85  39 - 117 U/L   Total Bilirubin 0.4  0.3 - 1.2 mg/dL   GFR calc non Af Amer 47 (*) >90 mL/min   GFR calc Af Amer 55 (*) >90 mL/min   Comment: (NOTE)     The eGFR has been calculated using the CKD EPI equation.     This calculation has not been validated in all clinical situations.     eGFR's persistently <90 mL/min signify possible Chronic Kidney     Disease.   Anion gap 21 (*) 5 - 15  APTT     Status: None   Collection Time    06/24/14 11:00 AM      Result Value Ref Range   aPTT 33  24 - 37 seconds  PROTIME-INR     Status: Abnormal   Collection Time    06/24/14 11:00 AM      Result Value Ref Range   Prothrombin Time 21.5 (*) 11.6 - 15.2 seconds   INR 1.87 (*) 0.00 - 1.49  TYPE AND SCREEN     Status: None   Collection Time    06/24/14 11:00 AM      Result Value Ref Range   ABO/RH(D) B POS     Antibody Screen NEG     Sample Expiration 06/27/2014     Unit Number [id number]     Blood Component Type RED CELLS,LR     Unit division 00     Status of Unit ISSUED     Unit tag comment VERBAL ORDERS PER DR KNAPP     Transfusion Status OK TO TRANSFUSE     Crossmatch Result COMPATIBLE     Unit Number [id number]     Blood Component Type RED CELLS,LR     Unit division 00     Status of Unit ISSUED     Unit tag comment VERBAL ORDERS PER DR KNAPP     Transfusion Status OK TO TRANSFUSE     Crossmatch Result COMPATIBLE     Unit Number [id number]     Blood Component Type RED CELLS,LR     Unit division 00     Status of Unit ISSUED     Transfusion Status OK TO TRANSFUSE     Crossmatch Result Compatible     Unit Number  [id number]     Blood Component Type RED CELLS,LR     Unit division 00     Status of Unit ISSUED     Transfusion Status OK TO TRANSFUSE     Crossmatch Result Compatible     Unit Number [id number]     Blood Component Type RED CELLS,LR     Unit division 00     Status of Unit ISSUED     Transfusion Status OK TO TRANSFUSE     Crossmatch Result Compatible     Unit Number [id number]     Blood Component Type RED CELLS,LR     Unit division 00     Status of Unit ISSUED     Transfusion Status OK TO TRANSFUSE     Crossmatch Result Compatible     Unit  Number [id number]     Blood Component Type RED CELLS,LR     Unit division 00     Status of Unit ISSUED     Transfusion Status OK TO TRANSFUSE     Crossmatch Result Compatible     Unit Number [id number]     Blood Component Type RED CELLS,LR     Unit division 00     Status of Unit ISSUED     Transfusion Status OK TO TRANSFUSE     Crossmatch Result Compatible    LIPASE, BLOOD     Status: Abnormal   Collection Time    06/24/14 11:00 AM      Result Value Ref Range   Lipase 78 (*) 11 - 59 U/L  AMMONIA     Status: None   Collection Time    06/24/14 11:00 AM      Result Value Ref Range   Ammonia 35  11 - 60 umol/L  ETHANOL     Status: Abnormal   Collection Time    06/24/14 11:00 AM      Result Value Ref Range   Alcohol, Ethyl (B) 131 (*) 0 - 11 mg/dL   Comment:            LOWEST DETECTABLE LIMIT FOR     SERUM ALCOHOL IS 11 mg/dL     FOR MEDICAL PURPOSES ONLY  PREPARE RBC (CROSSMATCH)     Status: None   Collection Time    06/24/14 11:00 AM      Result Value Ref Range   Order Confirmation ORDER PROCESSED BY BLOOD BANK    ABO/RH     Status: None   Collection Time    06/24/14 11:00 AM      Result Value Ref Range   ABO/RH(D) B POS    I-STAT TROPOININ, ED     Status: None   Collection Time    06/24/14 11:12 AM      Result Value Ref Range   Troponin i, poc 0.04  0.00 - 0.08 ng/mL   Comment 3            Comment: Due to  the release kinetics of cTnI,     a negative result within the first hours     of the onset of symptoms does not rule out     myocardial infarction with certainty.     If myocardial infarction is still suspected,     repeat the test at appropriate intervals.  I-STAT CHEM 8, ED     Status: Abnormal   Collection Time    06/24/14 11:14 AM      Result Value Ref Range   Sodium 143  137 - 147 mEq/L   Potassium 4.1  3.7 - 5.3 mEq/L   Chloride 114 (*) 96 - 112 mEq/L   BUN 14  6 - 23 mg/dL   Creatinine, Ser 1.90 (*) 0.50 - 1.35 mg/dL   Glucose, Bld 124 (*) 70 - 99 mg/dL   Calcium, Ion 1.03 (*) 1.12 - 1.23 mmol/L   TCO2 11  0 - 100 mmol/L   Hemoglobin 6.8 (*) 13.0 - 17.0 g/dL   HCT 20.0 (*) 39.0 - 52.0 %   Comment NOTIFIED PHYSICIAN    HEPATITIS C ANTIBODY     Status: Abnormal   Collection Time    06/24/14  1:40 PM      Result Value Ref Range   HCV Ab Reactive (*) NEGATIVE   Comment: (NOTE)  This test is for screening purposes only.  Reactive results should be     confirmed by an alternative method.  Suggest HCV Qualitative, PCR,     test code 83130.  Specimens will be stable for reflex testing up to 3     days after collection.     Performed at [facility]     Status: None   Collection Time    06/24/14  1:40 PM      Result Value Ref Range   Hepatitis B Surface Ag NEGATIVE  NEGATIVE   Comment: Performed at [place] ANTIBODY     Status: None   Collection Time    06/24/14  1:40 PM      Result Value Ref Range   Hep B S Ab NEGATIVE  NEGATIVE   Comment: Performed at [facility]  AFP TUMOR MARKER     Status: None   Collection Time    06/24/14  1:40 PM      Result Value Ref Range   AFP-Tumor Marker 2.8  0.0 - 8.0 ng/mL   Comment: (NOTE)     The Advia Centaur AFP immunoassay method is used.  Results obtained     with different  assay methods or kits cannot be used interchangeably.     AFP is a valuable aid in the management of nonseminomatous testicular     cancer patients when used in conjunction with information available     from the clinical evaluation and other diagnostic procedures.     Increased serum AFP concentrations have also been observed in ataxia     telangiectasia, hereditary tyrosinemia, primary hepatocellular     carcinoma, teratocarcinoma, gastrointestinal tract cancers with and     without liver metastases, and in benign hepatic conditions such as     acute viral hepatitis, chronic active hepatitis, and cirrhosis.  This     result cannot be interpreted as absolute evidence of the presence or     absence of malignant disease.  This result is not interpretable in     pregnant females.     Performed at [place], [place]     Status: Abnormal   Collection Time    06/24/14  8:30 PM      Result Value Ref Range   Glucose-Capillary 130 (*) 70 - 99 mg/dL  MRSA PCR SCREENING     Status: None   Collection Time    06/24/14  8:50 PM      Result Value Ref Range   MRSA by PCR NEGATIVE  NEGATIVE   Comment:            The GeneXpert MRSA Assay (FDA     approved for NASAL specimens     only), is one component of a     comprehensive MRSA colonization     surveillance program. It is not     intended to diagnose MRSA     infection nor to guide or     monitor treatment for     MRSA infections.  PREPARE RBC (CROSSMATCH)     Status: None   Collection Time    06/24/14  9:00 PM      Result Value Ref Range   Order Confirmation ORDER PROCESSED BY BLOOD BANK    PREPARE FRESH FROZEN PLASMA     Status: None   Collection Time    06/24/14  9:00 PM  Result Value Ref Range   Unit Number [id number]     Blood Component Type THAWED PLASMA     Unit division 00     Status of Unit ISSUED     Transfusion Status OK TO TRANSFUSE     Unit Number [id number]     Blood Component Type THAWED  PLASMA     Unit division 00     Status of Unit ISSUED     Transfusion Status OK TO TRANSFUSE    CBC     Status: Abnormal   Collection Time    06/25/14  3:00 AM      Result Value Ref Range   WBC 10.7 (*) 4.0 - 10.5 K/uL   RBC 1.66 (*) 4.22 - 5.81 MIL/uL   Hemoglobin 4.5 (*) 13.0 - 17.0 g/dL   Comment: REPEATED TO VERIFY     CRITICAL RESULT CALLED TO, READ BACK BY AND VERIFIED WITH:     Wyona Almas 7412 06/25/14 D BRADLEY   HCT 13.6 (*) 39.0 - 52.0 %   MCV 81.9  78.0 - 100.0 fL   MCH 27.1  26.0 - 34.0 pg   MCHC 33.1  30.0 - 36.0 g/dL   RDW 17.1 (*) 11.5 - 15.5 %   Platelets 62 (*) 150 - 400 K/uL   Comment: REPEATED TO VERIFY     SPECIMEN CHECKED FOR CLOTS     PLATELET COUNT CONFIRMED BY SMEAR  BASIC METABOLIC PANEL     Status: Abnormal   Collection Time    06/25/14  3:00 AM      Result Value Ref Range   Sodium 146  137 - 147 mEq/L   Potassium 5.4 (*) 3.7 - 5.3 mEq/L   Chloride 116 (*) 96 - 112 mEq/L   CO2 8 (*) 19 - 32 mEq/L   Comment: REPEATED TO VERIFY     CRITICAL RESULT CALLED TO, READ BACK BY AND VERIFIED WITH:     C.HEMPHILL,RN 0757 06/25/14 CLARK,S   Glucose, Bld 153 (*) 70 - 99 mg/dL   BUN 21  6 - 23 mg/dL   Creatinine, Ser 1.97 (*) 0.50 - 1.35 mg/dL   Calcium 6.5 (*) 8.4 - 10.5 mg/dL   GFR calc non Af Amer 37 (*) >90 mL/min   GFR calc Af Amer 43 (*) >90 mL/min   Comment: (NOTE)     The eGFR has been calculated using the CKD EPI equation.     This calculation has not been validated in all clinical situations.     eGFR's persistently <90 mL/min signify possible Chronic Kidney     Disease.   Anion gap 22 (*) 5 - 15  PHOSPHORUS     Status: Abnormal   Collection Time    06/25/14  3:00 AM      Result Value Ref Range   Phosphorus 5.0 (*) 2.3 - 4.6 mg/dL  MAGNESIUM     Status: None   Collection Time    06/25/14  3:00 AM      Result Value Ref Range   Magnesium 1.6  1.5 - 2.5 mg/dL  APTT     Status: None   Collection Time    06/25/14  3:00 AM      Result Value  Ref Range   aPTT 36  24 - 37 seconds  PROTIME-INR     Status: Abnormal   Collection Time    06/25/14  3:00 AM      Result Value Ref Range  Prothrombin Time 25.6 (*) 11.6 - 15.2 seconds   INR 2.33 (*) 0.00 - 1.49  PREPARE RBC (CROSSMATCH)     Status: None   Collection Time    06/25/14  3:00 AM      Result Value Ref Range   Order Confirmation ORDER PROCESSED BY BLOOD BANK    PREPARE PLATELET PHERESIS     Status: None   Collection Time    06/25/14  3:00 AM      Result Value Ref Range   Unit Number [id number]     Blood Component Type PLTPHER LR1     Unit division 00     Status of Unit ISSUED     Transfusion Status OK TO TRANSFUSE    PREPARE FRESH FROZEN PLASMA     Status: None   Collection Time    06/25/14  3:00 AM      Result Value Ref Range   Unit Number [id number]     Blood Component Type THAWED PLASMA     Unit division 00     Status of Unit ALLOCATED     Transfusion Status OK TO TRANSFUSE     Unit Number [id number]     Blood Component Type THAWED PLASMA     Unit division 00     Status of Unit ALLOCATED     Transfusion Status OK TO TRANSFUSE     Nm Gi Blood Loss  06/24/2014   CLINICAL DATA:  Bloody stools since yesterday. Evaluate for GI bleed.  EXAM: NUCLEAR MEDICINE GASTROINTESTINAL BLEEDING SCAN  TECHNIQUE: Sequential abdominal images were obtained following intravenous administration of Tc-52m labeled red blood cells.  RADIOPHARMACEUTICALS:  25 mCi Tc-59m in-vitro labeled red cells.  COMPARISON:  CT the abdomen pelvis - 06/24/2014; 02/08/2014  FINDINGS: The examination is degraded secondary to patient motion artifact. There is a faint ill-defined area of persistent radiotracer activity within the right lower abdominal quadrant seen on the initial 60 min of planar imaging. There is a rapid worsening of this radiotracer seen on the provided 110 min anterior projection planar images with subsequent retrograde flow into the distal sigmoid, ascending and transverse  colon.  Expected physiologic activity is seen within the hepatic parenchyma an intravascular blood pool of the abdomen and pelvis. Excreted free radiotracer is seen within the urinary bladder.  IMPRESSION: Examination is positive for acute lower GI bleed with source likely within the sigmoid colon.  Critical Value/emergent results were called by telephone at the time of interpretation on 06/24/2014 at 8:06 pm to Dr. Zenovia Jarred, who verbally acknowledged these results.   Electronically Signed   By: Sandi Mariscal M.D.   On: 06/24/2014 20:22   Ct Abdomen Pelvis W Contrast  06/24/2014   CLINICAL DATA:  GI bleeding.  Pain.  EXAM: CT ABDOMEN AND PELVIS WITH CONTRAST  TECHNIQUE: Multidetector CT imaging of the abdomen and pelvis was performed using the standard protocol following bolus administration of intravenous contrast.  CONTRAST:  15mL OMNIPAQUE IOHEXOL 300 MG/ML  SOLN  COMPARISON:  CT 02/08/2014.  FINDINGS: The liver has an irregular contour suggesting cirrhosis. Multiple new hepatic low-density lesions are noted. The largest measures 2.6 cm, image number 18/series 2. Although these could represent regenerating nodules, metastatic disease or multifocal hepatocellular carcinoma cannot be excluded. There is a new enhancing 2.2 cm nodule in the dome of the liver with similar differential, image number 12/series 2. Spleen is normal. Perigastric and periportal serpiginous densities are noted most likely varices. Periportal adenopathy cannot be excluded.  Partial thrombosis of the portal vein noted. Multiple retroperitoneal varices are present. Varicosities of the mesenteric venous system noted. Pelvic varicosities particularly prominent. Splenic vein patent. Hepatic veins patent. No biliary distention. Gallbladder wall edema noted, this may be from hypoproteinemia. Mild ascites present.  Adrenals normal. No focal renal abnormality. No hydronephrosis or obstructing ureteral stone. The bladder is unremarkable. Calcification  within the prostate.  No significant adenopathy. Aorta normal caliber with atherosclerotic vascular changes. Celiac and mesenteric arteries patent.  Appendix normal. Stool noted throughout the colon. Umbilical hernia is noted with what appears to be herniated small bowel. Mild proximal and mid small bowel distention is present. This could be secondary to partial obstruction. Diffuse small bowel wall edema noted. Although edema could be secondary to the hernia and partial obstruction, small bowel wall edema may also be secondary to hyperproteinemia and vascular congestion given the patient's partial portal vein thrombosis and varices. No pneumatosis intestinalis. No portal venous air. No free air.  Lung bases clear. Coronary artery disease. Mild cardiomegaly. Degenerative changes lumbar spine and both hips.  IMPRESSION: 1. Liver appears cirrhotic. Multiple new hepatic lesions are noted. Differential diagnosis includes regenerating nodules, metastatic disease, multifocal hepatocellular carcinoma. 2. Partial thrombosis of the portal vein. 3. Portal hypertension with multiple varices. 4. Periportal adenopathy cannot be excluded. 5. Umbilical hernia with herniation of small bowel. Partial small bowel obstruction cannot be excluded. 6. Extensive small bowel wall edema, although this could be related to partial small bowel obstruction, hyperproteinemia and vascular congestion could also result in bowel wall edema. No pneumatosis intestinalis. No portal venous air. No free air.   Electronically Signed   By: Marcello Moores  Register   On: 06/24/2014 13:03   Dg Chest Port 1 View  06/25/2014   CLINICAL DATA:  Check endotracheal tube  EXAM: PORTABLE CHEST - 1 VIEW  COMPARISON:  Chest x-ray from yesterday  FINDINGS: New endotracheal tube, tip at the clavicular heads. Unchanged positioning of a left IJ catheter.  Lower lung volumes with interstitial crowding and possible left lower lobe segmental atelectasis. There is no evidence of  effusion or pneumothorax.  IMPRESSION: 1. New endotracheal tube is in good position. 2. Low lung volumes and basilar atelectasis.   Electronically Signed   By: Jorje Guild M.D.   On: 06/25/2014 03:44   Dg Chest Port 1 View  06/24/2014   CLINICAL DATA:  Post central line placement  EXAM: PORTABLE CHEST - 1 VIEW  COMPARISON:  06/24/2014; 01/21/2014  FINDINGS: Grossly unchanged enlarged cardiac silhouette and mediastinal contours with atherosclerotic plaque within the thoracic aorta. Interval placement of left jugular approach intravenous catheter with tip projected over the central aspect of the left innominate vein. No pneumothorax. Grossly unchanged bilateral infrahilar heterogeneous opacities. No new focal airspace opacities. No pleural effusion. No evidence of edema. Unchanged bones.  IMPRESSION: 1. Interval placement of left jugular approach central venous catheter with tip projected over the central aspect of the left innominate vein. 2. Persistently reduced lung volumes with grossly unchanged infrahilar opacities, likely atelectasis.   Electronically Signed   By: Sandi Mariscal M.D.   On: 06/24/2014 21:46   Dg Chest Portable 1 View  06/24/2014   CLINICAL DATA:  Shortness of breath and hypotension  EXAM: PORTABLE CHEST - 1 VIEW  COMPARISON:  PA and lateral chest x-ray of January 21, 2014  FINDINGS: The lungs are hypoinflated but clear. The cardiac silhouette is mildly enlarged. The pulmonary vascularity is normal. There is no pleural effusion. The bony  thorax is unremarkable.  IMPRESSION: The study is limited due to hypoinflation. There is new enlargement of the cardiac silhouette without evidence of pulmonary edema.   Electronically Signed   By: David  [place]   On: 06/24/2014 11:33       Assessment/Plan 1. GI bleed, likely lower in origin 2. Cirrhosis of the liver, childs-pugh C 3. Portal hypertension 4. Multiple varices 5. Ventilator dependent respiratory failure 6. Hemorrhagic shock, with  anemia 7. Auto anticoagulated, secondary to cirrhosis 8. Hypoalbuminemia 9. EtOH abuse 10. Partial thrombosis of portal vein 11. Thrombocytopenia 12. Kidney disease 13. Diastolic dysfunction 14. History of hypertension  Plan: 1. Given the patient's cirrhosis, the patient has at least an 82% mortality risk with any type of surgical intervention. Therefore we would recommend aggressive conservative management with replacement of PRBCs and FFP to correct anemia as well as his INR to hopefully control his bleeding. 2. GI is planning a colonoscope this afternoon to directly visualize the colon and try to locate a source of bleeding. 3. We will follow along with you but no immediate plans for surgical intervention. We would highly recommend as much conservative management as possible as stated earlier his risk of mortality is extremely high with any type of surgical procedure.  Malin Sambrano E 06/25/2014, 9:05 AM Pager: [phone]

## 2014-06-25 NOTE — Progress Notes (Signed)
At 1600 patient's bp was 62/39 per art line, dr [name] performing coloscopy, he was informed that pressure was dropping and heart rhythm changed. He proceded to continue with scope in the rectum. At 1605, heart rhythm changed and pulse palpated was thready and weak, Dr. [name] notified and insisted on continuing with procedure. Dr Othella Boyer with e-link camered in and instructed [name] to stop procedure. Pulse was thready and pads placed on patient. Code Blue called, meds given-bicarb, calcium, insulin, atropine and blood products given. CPR started at 1610 and ended at 1618. Family was called and notified. Family returned to bedside and instructed that the patient was going to OR. Patient prepped for OR and taken emergently.  Randee Upchurch, Lauralyn Primes, RN

## 2014-06-25 NOTE — Transfer of Care (Signed)
Immediate Anesthesia Transfer of Care Note  Patient: Edwin Love  Procedure(s) Performed: Procedure(s): oversew of rectal varices (N/A)  Patient Location: PACU and ICU  Anesthesia Type:General  Level of Consciousness: unresponsive and obtunded  Airway & Oxygen Therapy: Patient remains intubated per anesthesia plan  Post-op Assessment: Report given to PACU RN and Post -op Vital signs reviewed and stable  Post vital signs: Reviewed and stable  Complications: No apparent anesthesia complications

## 2014-06-25 NOTE — Op Note (Signed)
oversew of rectal varices  Procedure Note  Edwin Love 06/24/2014 - 06/25/2014   Pre-op Diagnosis: Rectal Bleeding     Post-op Diagnosis: same  Procedure(s): oversew of rectal varices  Surgeon(s): Harl Bowie, MD  Anesthesia: General  Staff:  Circulator: Caryn Bee, RN; Courtney Paris, RN; Denna Haggard, RN; Royden Purl, RN; Abe People, RN Scrub Person: Leslie Andrea, CST  Estimated Blood Loss: 1000 cc                         [name]   Date: 06/25/2014  Time: 5:22 PM

## 2014-06-25 NOTE — Progress Notes (Signed)
Pt with profuse bleeding, BIOVAC used..Rectal exam:.  Bolus prior to procedure per ENDO RN request....and then additionally during procedure...Marland KitchenMarland Kitchenthe patient on vasopressors,  Cardiac rhythm deteriorated during procedure..cardiopulmonary resuscitation initiated with emergency drugs/CPR@16 :12, MAP = 31...the patient returned to [place] and rectal balloon placed with 62ml air to tamponode bleed, pt taken to OR emergently..... KJHenderson,RN

## 2014-06-25 NOTE — Sedation Documentation (Signed)
5Fr Exoseal closure by Richrd Humbles, RT

## 2014-06-25 NOTE — Sedation Documentation (Signed)
Received to IR from 2M01.  Pt vented responsive to move to table, on propofol and levophed, floor RN staying to give blood products.

## 2014-06-25 NOTE — Progress Notes (Addendum)
INITIAL NUTRITION ASSESSMENT  DOCUMENTATION CODES Per approved criteria  -Not Applicable   INTERVENTION: Recommend initiate Vital AF 1.2 @ 20 ml/hr ml/hr via OGT and increase by 10 ml every 4 hours to goal rate of 80 ml/hr.   Tube feeding regimen provides 2016 kcal (100% of needs), 131 grams of protein, and 1362 ml of H2O.   NUTRITION DIAGNOSIS: Inadequate oral intake related to inability to eat as evidenced by NPO status.   Goal: Pt will meet >90% of estimated nutritional needs  Monitor:  TF initiation/advancement/ tolerance, respiratory status, labs, weight changes, I/O's  Reason for Assessment: VDRF  53 y.o. male  Admitting Dx: GIB  ASSESSMENT: 53 y.o. M with cirrhosis and esophageal varices brought to ED after being found lying in pool of blood by family. Underwent EGD 7/16 which was negative for UGIB; however, pt continued to have massive hematochezia. Bleeding scan positive for LGIB in sigmoid colon. Per Dr. Elsworth Soho in rounds, pt awaiting abdominal ultrasound. TF not to start today due to multiple GI issues. Possibility for trickle feeds starting tomorrow. Spoke with RN who reports that propofol has been d.c and pt has transitioned to fentanyl drip. Also on octreotide for GIB management.  OGT in place. Patient is currently intubated on ventilator support MV: 14.7 L/min Temp (24hrs), Avg:98.5 F (36.9 C), Min:97.3 F (36.3 C), Max:99.3 F (37.4 C)  Nutrition focused physical exam completed; no signs of muscle or subcutaneous fat depletion noted.   Labs reviewed. K: 5.4, Phos: 5.0. Mg WNL. Glucose: 153. CGC: 130, last taken 06/24/14.  Height: Ht Readings from Last 1 Encounters:  06/24/14 5\' 9"  (1.753 m)    Weight: Wt Readings from Last 1 Encounters:  06/25/14 187 lb 6.3 oz (85 kg)    Ideal Body Weight: 72.73 kg  % Ideal Body Weight: 117%  Wt Readings from Last 10 Encounters:  06/25/14 187 lb 6.3 oz (85 kg)  06/25/14 187 lb 6.3 oz (85 kg)  06/25/14 187 lb 6.3  oz (85 kg)  06/25/14 187 lb 6.3 oz (85 kg)  06/25/14 187 lb 6.3 oz (85 kg)  04/29/14 181 lb (82.101 kg)  03/18/14 179 lb 12.8 oz (81.557 kg)  02/23/14 174 lb (78.926 kg)  02/22/14 176 lb (79.833 kg)  02/08/14 182 lb 15.7 oz (83 kg)    Usual Body Weight: 182#  % Usual Body Weight: 103%  BMI:  Body mass index is 27.66 kg/(m^2). Overweight  Estimated Nutritional Needs: Kcal: 2000.3 Protein: 117-127 grams Fluid: 2.0 L  Skin: WDL  Diet Order: NPO  EDUCATION NEEDS: -Education not appropriate at this time   Intake/Output Summary (Last 24 hours) at 06/25/14 1108 Last data filed at 06/25/14 1036  Gross per 24 hour  Intake 5196.15 ml  Output    500 ml  Net 4696.15 ml    Last BM: 06/25/14  Labs:   Recent Labs Lab 06/24/14 1100 06/24/14 1114 06/25/14 0300  NA 142 143 146  K 4.3 4.1 5.4*  CL 110 114* 116*  CO2 11*  --  8*  BUN 15 14 21   CREATININE 1.61* 1.90* 1.97*  CALCIUM 7.0*  --  6.5*  MG  --   --  1.6  PHOS  --   --  5.0*  GLUCOSE 130* 124* 153*    CBG (last 3)   Recent Labs  06/24/14 2030  GLUCAP 130*    Scheduled Meds: . cefTRIAXone (ROCEPHIN)  IV  1 g Intravenous Q24H  . fentaNYL  50 mcg  Intravenous Once  . folic acid  1 mg Intravenous Daily  . pantoprazole (PROTONIX) IV  40 mg Intravenous Q24H  . phytonadione (VITAMIN K) IV  10 mg Intravenous Daily  . thiamine  100 mg Intravenous Daily    Continuous Infusions: . fentaNYL infusion INTRAVENOUS 100 mcg/hr (06/25/14 1000)  . midazolam (VERSED) infusion 2 mg/hr (06/25/14 0944)  . norepinephrine (LEVOPHED) Adult infusion 5 mcg/min (06/25/14 0900)  . octreotide (SANDOSTATIN) infusion 25 mcg/hr (06/25/14 0940)  . propofol 10.054 mcg/kg/min (06/25/14 0800)  .  sodium bicarbonate  infusion 1000 mL 100 mL/hr at 06/25/14 9629    Past Medical History  Diagnosis Date  . Hypertension   . Back pain   . Renal disorder     CKD stage 3 noted in 02/2014 [facility]  . Anemia 01/2014  . LVH (left ventricular  hypertrophy) 02/2014    grade 2 diastolic dysfunction.   . Ankle fracture, left 10/2008    non surgical mgt.   . Thrombocytopenia 06/2014  . Coagulopathy 06/2014    Past Surgical History  Procedure Laterality Date  . Eye surgery Left 1990's    Foreign body  . Inguinal hernia repair Right 02/08/2014    Procedure: HERNIA REPAIR INGUINAL INCARCERATED;  Surgeon: Edward Jolly, MD;  Location: [place];  Service: General;  Laterality: Right;  . Insertion of mesh Right 02/08/2014    Procedure: INSERTION OF MESH;  Surgeon: Edward Jolly, MD;  Location: [place];  Service: General;  Laterality: Right;  . Hernia repair    . Esophagogastroduodenoscopy N/A 06/24/2014    Procedure: ESOPHAGOGASTRODUODENOSCOPY (EGD);  Surgeon: Inda Castle, MD;  Location: [place];  Service: Endoscopy;  Laterality: N/A;    Jaeliana Lococo A. Jimmye Norman, RD, LDN Pager: [phone] After hours Pager: [phone]

## 2014-06-25 NOTE — Procedures (Signed)
Intubation Procedure Note Edwin Love [id number] [birth year]  Procedure: Intubation Indications: Respiratory insufficiency  Procedure Details Consent: Unable to obtain consent because of emergent medical necessity. Time Out: Verified patient identification, verified procedure, site/side was marked, verified correct patient position, special equipment/implants available, medications/allergies/relevent history reviewed, required imaging and test results available.  Performed  Maximum sterile technique was used including cap, gloves, gown, hand hygiene and mask.  MAC and 3    Evaluation Hemodynamic Status: Transient hypotension treated with pressors and fluid; O2 sats: stable throughout Patient's Current Condition: unstable Complications: No apparent complications Patient did tolerate procedure well. Chest X-ray ordered to verify placement.  CXR: pending.  Montey Hora, [facility] Pgr: [phone]  or [phone]  Baltazar Apo, MD, PhD 06/25/2014, 3:17 AM [facility] [phone] or if no answer [phone]

## 2014-06-25 NOTE — Progress Notes (Signed)
Patient ID: Edwin Love, male   DOB: Feb 17, 1961, 53 y.o.   MRN: [id number]  Bleeding is coming from a varices in the rectum about 3 cm in.  Rest of colon was clean.  Pt arrested.  Now with pulse again. Need emergent exam under anesthesia and oversew of varices. Will discuss risk with patient's family.  Risk is basically death.

## 2014-06-25 NOTE — Progress Notes (Signed)
1 U PRBC & 4U FFP for  Hb 7.4 & INR 2.3 with active bleeding  Rigoberto Noel.  MD

## 2014-06-25 NOTE — Progress Notes (Signed)
eLink Physician-Brief Progress Note Patient Name: Edwin Love DOB: 12-21-1960 MRN: [id number]  Date of Service  06/25/2014   HPI/Events of Note   Lower GI hemorrhage Sock  High Levophed requirements   eICU Interventions   GI at bedside attempting colonoscopy D/c Propofol PRBC x 2 rapid infusion, stay 2 units ahead Tranexamic acid bolus and gtt Add Vasopressin Add Hydrocortisone    Intervention Category Major Interventions: Hemorrhage - evaluation and management;Shock - evaluation and management  Ramses Klecka 06/25/2014, 3:21 PM

## 2014-06-25 NOTE — Consult Note (Signed)
I have seen and examined the patient and agree with the assessment and plans. Agree with above.  I seriously doubt he would survive surgery (even not make it off the OR table) given his cirrhosis, varices, etc.  Will follow.  Lissy Deuser A. Ninfa Linden  MD, FACS

## 2014-06-25 NOTE — Progress Notes (Addendum)
Contacted at 02:40 by ICU nurse and then Dr. Alva Garnet with PCCM. Very recently patient has had recurrent large volume hematochezia with hypotension and altered mental status. Has received 2 units of FFP and 2 units of pRBC since approximately 7 pm. Now requiring norepi gtt for hypotension and just now has been intubated for airway protection. Dr. Alva Garnet spoke with Dr. Brantley Stage with [facility] who recommends IR for embolization as best option.  Tagged red scan + for left-sided colonic bleeding, but Dr. Brantley Stage felt localization with tagged scan often unreliable (bleeding could be left or right colonic making surgical localization also very difficult. I have seen the pt and currently, he is intubated, sedated, tachycardic but regular, clear BS b/l, abd is soft, +bs.  Hgb is 4.5 and he is receiving FFP #3 and 4, and pRBC #3 and 4. EGD was negative for UGI bleeding source approx 8 hours ago, and with + tagged scan, this is felt to be a colonic bleeding, with diverticular hemorrhage at top of ddx, though small bowel variceal bleed is also a possibility.  03:30 -- I have spoken with Dr. Kathlene Cote with IR regarding the current situation.  He is in agreement that an angiography study is the best option at present.  He will mobilize his team and plan angiography now.  pRBC unit 3 and 4 have been transfused and will order 2 additional units of blood at this time (#5 and 6).  Target Hgb 8 (was 4.5 and will be getting 2 units of pRBC).  Pt will travel to IR with ICU nurse. --IV ceftriaxone q24 hours for GI bleeding in the setting of cirrhosis with ascites for prophylaxis --GI will follow along with CCM, IR, and [facility]   Addendum: I have spoke with the patient's mother and father who are at bedside now.  They were previously unaware of his dx of cirrhosis.  I does live with them currently.  I have updated them as to his clinical situation, critical illness, and plans for resuscitation and interventional radiology for  angiography.  Time was provided for questions and answers and they thanked [place] for our work.  They will plan to remain in ICU waiting room at this time.

## 2014-06-25 NOTE — Progress Notes (Signed)
Chaplain was visiting another pt on this floor when she was stopped by pt family member. Family consisting of pt. Father, daughter and mother requested prayer. Chaplain provided prayer and a listening ear.   Charyl Dancer, [place]

## 2014-06-25 NOTE — Progress Notes (Signed)
Patient given FFP at 1530, infused at 999 via pressure bag per MD order. Unit number [id number] B Positive. Checked off with Emmaline Kluver, RN.  PRBC unit number [id number] B Positive. Checked off with Emmaline Kluver. Given emergent via pressure bag for emergent OR procedure.  PRBC unit number [id number] B Positive. Checked off with [employer]. Given emergent via pressure bag for emergent OR procedure. All units were checked at the bedside and forms signed at the bedside. Patient bp was sbp 40-50's. Md aware.

## 2014-06-25 NOTE — Progress Notes (Signed)
UR Completed.  [name] [phone] 06/25/2014

## 2014-06-25 NOTE — Anesthesia Postprocedure Evaluation (Signed)
  Anesthesia Post-op Note  Patient: Edwin Love  Procedure(s) Performed: Procedure(s): oversew of rectal varices (N/A)  Patient Location: ICU  Anesthesia Type:General  Level of Consciousness: obtunded and Patient remains intubated per anesthesia plan  Airway and Oxygen Therapy: Patient remains intubated per anesthesia plan and Patient placed on Ventilator (see vital sign flow sheet for setting)  Post-op Pain: none  Post-op Assessment: Post-op Vital signs reviewed and Patient's Cardiovascular Status Stable  Post-op Vital Signs: Reviewed and stable  Last Vitals:  Filed Vitals:   06/25/14 1731  BP: 178/62  Pulse: 116  Temp:   Resp: 17    Complications: No apparent anesthesia complications

## 2014-06-25 NOTE — Procedures (Signed)
Procedure:  Visceral arteriography Access:  Right CFA, 5 Fr sheath Findings:  Selective arteriography of IMA, SMA and left gastric artery performed. Unable to find separate celiac axis. Vessels extremely clamped down; 100 mcg NTG given in IMA and SMA during angio. No acute arterial bleeding source identified.  No AVM, pseudoaneurysm or AV fistula. Bleeding source may be the large intestinal/mesenteric varices identified by CT.  In pelvis, these varices invaginate the rectosigmoid by CT. Patient would be a poor candidate for TIPS currently and TIPS not guaranteed to completely decompress these varices.

## 2014-06-25 NOTE — Progress Notes (Signed)
eLink Physician-Brief Progress Note Patient Name: Edwin Love DOB: [birth year] MRN: [id number]  Date of Service  06/25/2014   HPI/Events of Note   Near arrest Wide complex bradycardia Hyperkalemia?  Recent Labs Lab 06/24/14 1100 06/24/14 1114 06/25/14 0300 06/25/14 1130  K 4.3 4.1 5.4* 6.2*     eICU Interventions   Calcium Bicarbonate Insulin D50 ACLS Dr. Halford Chessman at bedside    Intervention Category Major Interventions: Other:  Doree Fudge 06/25/2014, 4:19 PM

## 2014-06-25 NOTE — Progress Notes (Signed)
Daily Rounding Note  06/25/2014, 8:24 AM  LOS: 1 day   SUBJECTIVE:       Recent overnight events noted.  + tagged scan but negative arteriogram.   Lots of blood products received.   OBJECTIVE:         Vital signs in last 24 hours:    Temp:  [97.3 F (36.3 C)-99.3 F (37.4 C)] 98 F (36.7 C) (07/17 0520) Pulse Rate:  [72-130] 77 (07/17 0715) Resp:  [12-33] 22 (07/17 0715) BP: (75-197)/(34-98) 110/55 mmHg (07/17 0700) SpO2:  [81 %-100 %] 100 % (07/17 0715) Arterial Line BP: (87-179)/(28-63) 167/57 mmHg (07/17 0700) FiO2 (%):  [40 %] 40 % (07/17 0715) Weight:  [85 kg (187 lb 6.3 oz)-86.183 kg (190 lb)] 85 kg (187 lb 6.3 oz) (07/17 0440) Last BM Date: 06/25/14 General: sedated, intubated.  Not responsive   Heart: RRR Chest: clear, tubed on vent Abdomen: soft, NT, somewhat protuberant.  BS present Extremities: no CCE Neuro/Psych:  Sedated.  Not agitated.   Intake/Output from previous day: 07/16 0701 - 07/17 0700 In: 4358.5 [I.V.:2999; Blood:1309.5; IV Piggyback:50] Out: 500 [Urine:500]  Intake/Output this shift: Total I/O In: 111.4 [I.V.:98.4; Blood:13] Out: -   Lab Results:  Recent Labs  06/24/14 1100 06/24/14 1114 06/25/14 0300  WBC 8.5  --  10.7*  HGB 6.2* 6.8* 4.5*  HCT 18.2* 20.0* 13.6*  PLT 94*  --  62*   BMET  Recent Labs  06/24/14 1100 06/24/14 1114 06/25/14 0300  NA 142 143 146  K 4.3 4.1 5.4*  CL 110 114* 116*  CO2 11*  --  8*  GLUCOSE 130* 124* 153*  BUN 15 14 21   CREATININE 1.61* 1.90* 1.97*  CALCIUM 7.0*  --  6.5*   LFT  Recent Labs  06/24/14 1100  PROT 4.7*  ALBUMIN 1.4*  AST 71*  ALT 20  ALKPHOS 85  BILITOT 0.4   PT/INR  Recent Labs  06/24/14 1100 06/25/14 0300  LABPROT 21.5* 25.6*  INR 1.87* 2.33*   Hepatitis Panel  Recent Labs  06/24/14 1340  HEPBSAG NEGATIVE  HCVAB Reactive*  AFP             2.8  Studies/Results: Nm Gi Blood Loss 06/24/2014  FINDINGS: The examination is degraded secondary to patient motion artifact. There is a faint ill-defined area of persistent radiotracer activity within the right lower abdominal quadrant seen on the initial 60 min of planar imaging. There is a rapid worsening of this radiotracer seen on the provided 110 min anterior projection planar images with subsequent retrograde flow into the distal sigmoid, ascending and transverse colon.  Expected physiologic activity is seen within the hepatic parenchyma an intravascular blood pool of the abdomen and pelvis. Excreted free radiotracer is seen within the urinary bladder.  IMPRESSION: Examination is positive for acute lower GI bleed with source likely within the sigmoid colon.  Critical Value/emergent results were called by telephone at the time of interpretation on 06/24/2014 at 8:06 pm to Dr. Zenovia Jarred, who verbally acknowledged these results.   Electronically Signed   By: Sandi Mariscal M.D.   On: 06/24/2014 20:22   Ct Abdomen Pelvis W Contrast 06/24/2014  COMPARISON:  CT 02/08/2014.  FINDINGS: The liver has an irregular contour suggesting cirrhosis. Multiple new hepatic low-density lesions are noted. The largest measures 2.6 cm, image number 18/series 2. Although these could represent regenerating nodules, metastatic disease or multifocal hepatocellular carcinoma cannot be excluded.  There is a new enhancing 2.2 cm nodule in the dome of the liver with similar differential, image number 12/series 2. Spleen is normal. Perigastric and periportal serpiginous densities are noted most likely varices. Periportal adenopathy cannot be excluded. Partial thrombosis of the portal vein noted. Multiple retroperitoneal varices are present. Varicosities of the mesenteric venous system noted. Pelvic varicosities particularly prominent. Splenic vein patent. Hepatic veins patent. No biliary distention. Gallbladder wall edema noted, this may be from hypoproteinemia. Mild ascites present.   Adrenals normal. No focal renal abnormality. No hydronephrosis or obstructing ureteral stone. The bladder is unremarkable. Calcification within the prostate.  No significant adenopathy. Aorta normal caliber with atherosclerotic vascular changes. Celiac and mesenteric arteries patent.  Appendix normal. Stool noted throughout the colon. Umbilical hernia is noted with what appears to be herniated small bowel. Mild proximal and mid small bowel distention is present. This could be secondary to partial obstruction. Diffuse small bowel wall edema noted. Although edema could be secondary to the hernia and partial obstruction, small bowel wall edema may also be secondary to hyperproteinemia and vascular congestion given the patient's partial portal vein thrombosis and varices. No pneumatosis intestinalis. No portal venous air. No free air.  Lung bases clear. Coronary artery disease. Mild cardiomegaly. Degenerative changes lumbar spine and both hips.  IMPRESSION: 1. Liver appears cirrhotic. Multiple new hepatic lesions are noted. Differential diagnosis includes regenerating nodules, metastatic disease, multifocal hepatocellular carcinoma. 2. Partial thrombosis of the portal vein. 3. Portal hypertension with multiple varices. 4. Periportal adenopathy cannot be excluded. 5. Umbilical hernia with herniation of small bowel. Partial small bowel obstruction cannot be excluded. 6. Extensive small bowel wall edema, although this could be related to partial small bowel obstruction, hyperproteinemia and vascular congestion could also result in bowel wall edema. No pneumatosis intestinalis. No portal venous air. No free air.   Electronically Signed   By: Marcello Moores  Register   On: 06/24/2014 13:03   Dg Chest Port 1 View 06/25/2014   CLINICAL DATA:  Check endotracheal tube  EXAM: PORTABLE CHEST - 1 VIEW  COMPARISON:  Chest x-ray from yesterday  FINDINGS: New endotracheal tube, tip at the clavicular heads. Unchanged positioning of a left IJ  catheter.  Lower lung volumes with interstitial crowding and possible left lower lobe segmental atelectasis. There is no evidence of effusion or pneumothorax.  IMPRESSION: 1. New endotracheal tube is in good position. 2. Low lung volumes and basilar atelectasis.   Electronically Signed   By: Jorje Guild M.D.   On: 06/25/2014 03:44    Scheduled Meds: . calcium gluconate  1 g Intravenous Once  . cefTRIAXone (ROCEPHIN)  IV  1 g Intravenous Q24H  . fentaNYL  50 mcg Intravenous Once  . folic acid  1 mg Intravenous Daily  . octreotide  50 mcg Intravenous Once  . pantoprazole (PROTONIX) IV  40 mg Intravenous Q24H  . phytonadione (VITAMIN K) IV  10 mg Intravenous Daily  . thiamine  100 mg Intravenous Daily   Continuous Infusions: . fentaNYL infusion INTRAVENOUS    . midazolam (VERSED) infusion    . norepinephrine (LEVOPHED) Adult infusion 25 mcg/min (06/25/14 0753)  . octreotide (SANDOSTATIN) infusion    . propofol 10.054 mcg/kg/min (06/25/14 0800)  .  sodium bicarbonate  infusion 1000 mL     PRN Meds:.sodium chloride, fentaNYL, fentaNYL, LORazepam, midazolam   ASSESMENT:   *  Acute lower GI hemorrhage. EGD 7/16:  4+ but non-bleeding esophageal varices, no blood in stomach.  Tagged RBC scan +  for left sided colonic bleed.  Arteriography 7/17: unable to find separate celiax axis, vessels clamped down, NTG IV given.  No bleeding source ID'd.  Bleeding source may be large intestine or mesenteric varices as seen on CT. Poor candidate for TIPS.  On norepinephrine for shock, rocephin, Octreotide and Protonix drips.  Hep C positive.  AFP not elevated   *  Coagulopathy, thrombocytopenia.  S/p FFPx 2, platelets x 1, daily vitamin K IV.  Despite this coags further elevated this AM.  *  ABL anemia.  S/p PRBCs x 7.  * AKI.  Rising creatinine.   *  Alcoholism/abuse.    PLAN   *  Dr [name] and Kathlene Cote discussed case.  Plan is for colonoscopy this afternoon in attempt to identify varices,  locus of bleeding.  This may then be able to guide focus of therapies such as embolization or TIPS.    Azucena Freed  06/25/2014, 8:24 AM Pager: [phone]  GI Attending Note  I have personally taken an interval history, reviewed the chart, and examined the patient.I suspect that the patient has had a variceal bleed from a source distal to the esophagus and stomach.  Recommend octreotide drip.  Plan colonoscopy in an effort to isolate the source for bleeding.  If only rectal varices are seen, this may help direct embolization therapy if the patient has recurrent bleeding.  On the basis of the CT, he could have bleeding varices from the small bowel as well.  TIPS procedure does not guarantee decompression of all his varices but will have to be considered in the event that he has recurrent bleeding. Sandy Salaam. [name], MD, [facility] [phone]

## 2014-06-25 NOTE — Code Documentation (Signed)
Pt developed bradycardia/hypotension during colonoscopy leading to PEA and then asystole.  He was noted to have hyperkalemia earlier in the day.  CPR performed immediately and he was given epi/atropine x 1 each.  He was given insulin, bicarbonate, and calcium.  He was given 2 units PRBC via pressure bag.  He had return of pulse and blood pressure.  GI and surgery are at bedside.  Source of bleeding localized to varices at rectum.  Surgery is preparing pt for intervention to control bleeding.

## 2014-06-25 NOTE — Anesthesia Preprocedure Evaluation (Addendum)
Anesthesia Evaluation  Patient identified by MRN, date of birth, ID band Patient unresponsive    Reviewed: Unable to perform ROS - Chart review only  Airway      Comment: Intubated  Dental   Pulmonary Current Smoker,  + rhonchi         Cardiovascular hypertension, Pt. on medications Rhythm:Regular Rate:Tachycardia     Neuro/Psych    GI/Hepatic   Endo/Other    Renal/GU      Musculoskeletal   Abdominal   Peds  Hematology   Anesthesia Other Findings   Reproductive/Obstetrics                          Anesthesia Physical Anesthesia Plan  ASA: IV and emergent  Anesthesia Plan: General   Post-op Pain Management:    Induction: Intravenous  Airway Management Planned: Oral ETT  Additional Equipment:   Intra-op Plan:   Post-operative Plan: Post-operative intubation/ventilation  Informed Consent: I have reviewed the patients History and Physical, chart, labs and discussed the procedure including the risks, benefits and alternatives for the proposed anesthesia with the patient or authorized representative who has indicated his/her understanding and acceptance.   History available from chart only and Only emergency history available  Plan Discussed with: CRNA, Anesthesiologist and Surgeon  Anesthesia Plan Comments:         Anesthesia Quick Evaluation                                  Anesthesia Evaluation  Patient identified by MRN, date of birth, ID band Patient awake    Reviewed: Allergy & Precautions, H&P , NPO status , Patient's Chart, lab work & pertinent test results  History of Anesthesia Complications Negative for: history of anesthetic complications  Airway Mallampati: II TM Distance: >3 FB Neck ROM: Full    Dental  (+) Missing   Pulmonary neg sleep apnea, neg COPDneg recent URI, Current Smoker,          Cardiovascular hypertension, Pt. on medications  and Pt. on home beta blockers - angina+CHF - CAD and - Past MI Rhythm:Regular     Neuro/Psych PSYCHIATRIC DISORDERS Depression negative neurological ROS     GI/Hepatic negative GI ROS, Neg liver ROS,   Endo/Other    Renal/GU CRFRenal disease     Musculoskeletal   Abdominal   Peds  Hematology  (+) anemia ,   Anesthesia Other Findings   Reproductive/Obstetrics                           Anesthesia Physical Anesthesia Plan  ASA: III  Anesthesia Plan: MAC   Post-op Pain Management:    Induction:   Airway Management Planned: Natural Airway  Additional Equipment: None  Intra-op Plan:   Post-operative Plan:   Informed Consent: I have reviewed the patients History and Physical, chart, labs and discussed the procedure including the risks, benefits and alternatives for the proposed anesthesia with the patient or authorized representative who has indicated his/her understanding and acceptance.   Dental advisory given  Plan Discussed with: CRNA and Surgeon  Anesthesia Plan Comments:         Anesthesia Quick Evaluation

## 2014-06-25 NOTE — Sedation Documentation (Addendum)
Levophed increased to 72mcgs, charted by floor RN.  MD aware

## 2014-06-25 NOTE — Sedation Documentation (Signed)
Groin stick by Dr Kathlene Cote

## 2014-06-25 NOTE — H&P (Signed)
Did not see patient he was directly admitted to ICU

## 2014-06-25 NOTE — Progress Notes (Addendum)
PULMONARY / CRITICAL CARE MEDICINE   Name: Edwin Love MRN: [id number] DOB: 1960-12-16    ADMISSION DATE: 06/24/2014  CONSULTATION DATE: 06/24/2014   REFERRING MD : [name]  PRIMARY SERVICE: PCCM   CHIEF COMPLAINT: Lower GI Hemorrhage   BRIEF PATIENT DESCRIPTION: 53 y.o. M with cirrhosis and esophageal varices brought to ED after being found lying in pool of blood by family. Underwent EGD 7/16 which was negative for UGIB; however, pt continued to have massive hematochezia. Bleeding scan positive for LGIB in sigmoid colon. PCCM consulted and pt admitted to ICU.   SIGNIFICANT EVENTS / STUDIES:  7/16 Abd CT >>> cirrhosis, multiple new hepatic lesions are noted, partial thrombosis of portal vein, portal HTN with multiple varices, periportal adenopathy cannot be excluded, umbilical hernia with herniation of small bowel, extensive small bowel wall edema. 7/16 EGD >>> varices present; however, no acute bleeding.  7/16 Bleeding Scan >>> positive for acute lower GI bleed with source likely within the sigmoid colon.  7/17 Continued brisk BRBPR, shock, altered MS 7/17 Intubated 7/17 Angiogram of IMA, SMA and left gastric artery -No acute arterial bleeding source identified    LINES / TUBES:  Left IJ large bore TLC 7/16 >>>  ETT 7/17 >>   CULTURES:  7/16 HIV Ab >>> 7/16 HCV Ab >>> POS  7/16 AFP >>> POS  ANTIBIOTICS:  Ceftriaxone 7/17 >>   INTERVAL EVENTS: Remains on high dose levophed, being transfused Sedated on propofol gtt  VITAL SIGNS: Temp:  [97.3 F (36.3 C)-99.3 F (37.4 C)] 98.7 F (37.1 C) (07/17 0838) Pulse Rate:  [72-130] 93 (07/17 1130) Resp:  [12-33] 20 (07/17 1130) BP: (75-197)/(34-98) 95/58 mmHg (07/17 1130) SpO2:  [94 %-100 %] 100 % (07/17 1130) Arterial Line BP: (87-193)/(28-91) 120/54 mmHg (07/17 1130) FiO2 (%):  [40 %] 40 % (07/17 0800) Weight:  [85 kg (187 lb 6.3 oz)-86.183 kg (190 lb)] 85 kg (187 lb 6.3 oz) (07/17 0440) HEMODYNAMICS: CVP:  [5  mmHg] 5 mmHg VENTILATOR SETTINGS: Vent Mode:  [-] PRVC FiO2 (%):  [40 %] 40 % Set Rate:  [14 bmp] 14 bmp Vt Set:  [650 mL] 650 mL PEEP:  [5 cmH20] 5 cmH20 Plateau Pressure:  [14 cmH20-16 cmH20] 16 cmH20 INTAKE / OUTPUT: Intake/Output     07/16 0701 - 07/17 0700 07/17 0701 - 07/18 0700   I.V. (mL/kg) 3074 (36.2) 684.7 (8.1)   Blood 1309.5 460   IV Piggyback 50    Total Intake(mL/kg) 4433.5 (52.2) 1144.7 (13.5)   Urine (mL/kg/hr) 500    Total Output 500     Net +3933.5 +1144.7         PHYSICAL EXAMINATION: General: Ill-appearing, sedated HEENT: NCAT, MMM, enucleated left eye  Cardiovascular: Regular rate, 1+ flow murmur  Respiratory: Non-labored, 100% on O2, CTA anteriorly  Abdomen: +BS, + easily reducible umbilical hernia, NT, ND, soft  Extremities: No obvious deformities  Skin: Pale, no wounds noted  Neuro: sedated on propofol   LABS:  CBC  Recent Labs Lab 06/24/14 1100 06/24/14 1114 06/25/14 0300  WBC 8.5  --  10.7*  HGB 6.2* 6.8* 4.5*  HCT 18.2* 20.0* 13.6*  PLT 94*  --  62*   Coag's  Recent Labs Lab 06/24/14 1100 06/25/14 0300  APTT 33 36  INR 1.87* 2.33*   BMET  Recent Labs Lab 06/24/14 1100 06/24/14 1114 06/25/14 0300  NA 142 143 146  K 4.3 4.1 5.4*  CL 110 114* 116*  CO2 11*  --  8*  BUN 15 14 21   CREATININE 1.61* 1.90* 1.97*  GLUCOSE 130* 124* 153*   Electrolytes  Recent Labs Lab 06/24/14 1100 06/25/14 0300  CALCIUM 7.0* 6.5*  MG  --  1.6  PHOS  --  5.0*   Sepsis Markers No results found for this basename: LATICACIDVEN, PROCALCITON, O2SATVEN,  in the last 168 hours ABG  Recent Labs Lab 06/25/14 0849  PHART 7.245*  PCO2ART 21.1*  PO2ART 188.0*   Liver Enzymes  Recent Labs Lab 06/24/14 1100  AST 71*  ALT 20  ALKPHOS 85  BILITOT 0.4  ALBUMIN 1.4*   Cardiac Enzymes No results found for this basename: TROPONINI, PROBNP,  in the last 168 hours Glucose  Recent Labs Lab 06/24/14 2030  GLUCAP 130*     Imaging Nm Gi Blood Loss  06/24/2014   CLINICAL DATA:  Bloody stools since yesterday. Evaluate for GI bleed.  EXAM: NUCLEAR MEDICINE GASTROINTESTINAL BLEEDING SCAN  TECHNIQUE: Sequential abdominal images were obtained following intravenous administration of Tc-66m labeled red blood cells.  RADIOPHARMACEUTICALS:  25 mCi Tc-18m in-vitro labeled red cells.  COMPARISON:  CT the abdomen pelvis - 06/24/2014; 02/08/2014  FINDINGS: The examination is degraded secondary to patient motion artifact. There is a faint ill-defined area of persistent radiotracer activity within the right lower abdominal quadrant seen on the initial 60 min of planar imaging. There is a rapid worsening of this radiotracer seen on the provided 110 min anterior projection planar images with subsequent retrograde flow into the distal sigmoid, ascending and transverse colon.  Expected physiologic activity is seen within the hepatic parenchyma an intravascular blood pool of the abdomen and pelvis. Excreted free radiotracer is seen within the urinary bladder.  IMPRESSION: Examination is positive for acute lower GI bleed with source likely within the sigmoid colon.  Critical Value/emergent results were called by telephone at the time of interpretation on 06/24/2014 at 8:06 pm to Dr. Zenovia Jarred, who verbally acknowledged these results.   Electronically Signed   By: Sandi Mariscal M.D.   On: 06/24/2014 20:22   Ct Abdomen Pelvis W Contrast  06/24/2014   CLINICAL DATA:  GI bleeding.  Pain.  EXAM: CT ABDOMEN AND PELVIS WITH CONTRAST  TECHNIQUE: Multidetector CT imaging of the abdomen and pelvis was performed using the standard protocol following bolus administration of intravenous contrast.  CONTRAST:  52mL OMNIPAQUE IOHEXOL 300 MG/ML  SOLN  COMPARISON:  CT 02/08/2014.  FINDINGS: The liver has an irregular contour suggesting cirrhosis. Multiple new hepatic low-density lesions are noted. The largest measures 2.6 cm, image number 18/series 2. Although  these could represent regenerating nodules, metastatic disease or multifocal hepatocellular carcinoma cannot be excluded. There is a new enhancing 2.2 cm nodule in the dome of the liver with similar differential, image number 12/series 2. Spleen is normal. Perigastric and periportal serpiginous densities are noted most likely varices. Periportal adenopathy cannot be excluded. Partial thrombosis of the portal vein noted. Multiple retroperitoneal varices are present. Varicosities of the mesenteric venous system noted. Pelvic varicosities particularly prominent. Splenic vein patent. Hepatic veins patent. No biliary distention. Gallbladder wall edema noted, this may be from hypoproteinemia. Mild ascites present.  Adrenals normal. No focal renal abnormality. No hydronephrosis or obstructing ureteral stone. The bladder is unremarkable. Calcification within the prostate.  No significant adenopathy. Aorta normal caliber with atherosclerotic vascular changes. Celiac and mesenteric arteries patent.  Appendix normal. Stool noted throughout the colon. Umbilical hernia is noted with what appears to be herniated small bowel. Mild proximal and  mid small bowel distention is present. This could be secondary to partial obstruction. Diffuse small bowel wall edema noted. Although edema could be secondary to the hernia and partial obstruction, small bowel wall edema may also be secondary to hyperproteinemia and vascular congestion given the patient's partial portal vein thrombosis and varices. No pneumatosis intestinalis. No portal venous air. No free air.  Lung bases clear. Coronary artery disease. Mild cardiomegaly. Degenerative changes lumbar spine and both hips.  IMPRESSION: 1. Liver appears cirrhotic. Multiple new hepatic lesions are noted. Differential diagnosis includes regenerating nodules, metastatic disease, multifocal hepatocellular carcinoma. 2. Partial thrombosis of the portal vein. 3. Portal hypertension with multiple  varices. 4. Periportal adenopathy cannot be excluded. 5. Umbilical hernia with herniation of small bowel. Partial small bowel obstruction cannot be excluded. 6. Extensive small bowel wall edema, although this could be related to partial small bowel obstruction, hyperproteinemia and vascular congestion could also result in bowel wall edema. No pneumatosis intestinalis. No portal venous air. No free air.   Electronically Signed   By: Marcello Moores  Register   On: 06/24/2014 13:03   Ir Angiogram Visceral Selective  06/25/2014   CLINICAL DATA:  Massive acute lower GI bleed with severe anemia and hypotension. Bleeding scan suggested bleeding from the distal colon.  EXAM: 1. ULTRASOUND GUIDANCE FOR VASCULAR ACCESS OF THE RIGHT COMMON FEMORAL ARTERY 2. SELECTIVE ARTERIOGRAPHY OF THE INFERIOR MESENTERIC ARTERY 3. SELECTIVE ARTERIOGRAPHY OF THE SUPERIOR MESENTERIC ARTERY 4. SELECTIVE ARTERIOGRAPHY OF THE LEFT GASTRIC ARTERY  MEDICATIONS: 200 mcg intra-arterial nitroglycerin  CONTRAST:  180mL VISIPAQUE IODIXANOL 320 MG/ML IV SOLN  FLUOROSCOPY TIME:  15 min and 48 seconds.  PROCEDURE: The patient was intubated and unresponsive during the procedure. Emergent consent was obtained from the patient's father for arteriography with possible embolization.  The right groin was prepped with Betadine and draped. Maximal Sterile Barrier Technique was utilized including caps, mask, sterile gowns, sterile gloves, sterile drape, hand hygiene and skin antiseptic.  Ultrasound was used to confirm patency of the right common femoral artery. Access of the artery was performed with a 21 gauge needle under direct ultrasound guidance. After securing access with a micropuncture set, a 5 French sheath was placed over a guidewire.  A 5 [place] cobra catheter was introduced into the abdominal aorta. The inferior mesenteric artery was selectively catheterized. Selective arteriography was performed prior to, and following, administration of 100 mcg  intra-arterial nitroglycerin directly into the IMA.  The catheter was then used to selectively catheterize the superior mesenteric artery. Selective arteriography was performed prior to, and following, administration of 100 mcg intra-arterial nitroglycerin.  The catheter was then used to selectively catheterize the left gastric artery. Selective arteriography was performed.  The catheter was removed. Hemostasis was obtained at the arteriotomy with the Cordis ExoSeal device.  FINDINGS: Initial inferior mesenteric arteriography shows severe vasoconstriction, limiting visualization of distal branch vessels. After administration of nitroglycerin, improved visualization of arteries was identified. There is no evidence of active arterial bleeding, vascular malformation or other vascular abnormality within the IMA territory.  SMA arteriography demonstrated similar severe vasoconstriction which did show some improvement after administration of nitroglycerin. Arteriography demonstrates no evidence of active arterial bleeding, vascular malformation or other vascular abnormality.  In attempting catheterization of the celiac axis, selective arteriography was performed of a left gastric artery emanating from the abdominal aorta. 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 Injection at the  time of SMA arteriography was likely distal to the hepatic and splenic arteries.  IMPRESSION: Negative visceral arteriography for arterial bleeding source in the distribution of the inferior mesenteric artery, superior mesenteric artery and left gastric artery. 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   Electronically Signed   By: Aletta Edouard M.D.   On: 06/25/2014 11:08   Ir Angiogram Visceral Selective  06/25/2014   CLINICAL DATA:  Massive acute lower GI bleed with  severe anemia and hypotension. Bleeding scan suggested bleeding from the distal colon.  EXAM: 1. ULTRASOUND GUIDANCE FOR VASCULAR ACCESS OF THE RIGHT COMMON FEMORAL ARTERY 2. SELECTIVE ARTERIOGRAPHY OF THE INFERIOR MESENTERIC ARTERY 3. SELECTIVE ARTERIOGRAPHY OF THE SUPERIOR MESENTERIC ARTERY 4. SELECTIVE ARTERIOGRAPHY OF THE LEFT GASTRIC ARTERY  MEDICATIONS: 200 mcg intra-arterial nitroglycerin  CONTRAST:  170mL VISIPAQUE IODIXANOL 320 MG/ML IV SOLN  FLUOROSCOPY TIME:  15 min and 48 seconds.  PROCEDURE: The patient was intubated and unresponsive during the procedure. Emergent consent was obtained from the patient's father for arteriography with possible embolization.  The right groin was prepped with Betadine and draped. Maximal Sterile Barrier Technique was utilized including caps, mask, sterile gowns, sterile gloves, sterile drape, hand hygiene and skin antiseptic.  Ultrasound was used to confirm patency of the right common femoral artery. Access of the artery was performed with a 21 gauge needle under direct ultrasound guidance. After securing access with a micropuncture set, a 5 French sheath was placed over a guidewire.  A 5 [place] cobra catheter was introduced into the abdominal aorta. The inferior mesenteric artery was selectively catheterized. Selective arteriography was performed prior to, and following, administration of 100 mcg intra-arterial nitroglycerin directly into the IMA.  The catheter was then used to selectively catheterize the superior mesenteric artery. Selective arteriography was performed prior to, and following, administration of 100 mcg intra-arterial nitroglycerin.  The catheter was then used to selectively catheterize the left gastric artery. Selective arteriography was performed.  The catheter was removed. Hemostasis was obtained at the arteriotomy with the Cordis ExoSeal device.  FINDINGS: Initial inferior mesenteric arteriography shows severe vasoconstriction, limiting visualization  of distal branch vessels. After administration of nitroglycerin, improved visualization of arteries was identified. There is no evidence of active arterial bleeding, vascular malformation or other vascular abnormality within the IMA territory.  SMA arteriography demonstrated similar severe vasoconstriction which did show some improvement after administration of nitroglycerin. Arteriography demonstrates no evidence of active arterial bleeding, vascular malformation or other vascular abnormality.  In attempting catheterization of the celiac axis, selective arteriography was performed of a left gastric artery emanating from the abdominal aorta. 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 Injection at the time of SMA arteriography was likely distal to the hepatic and splenic arteries.  IMPRESSION: Negative visceral arteriography for arterial bleeding source in the distribution of the inferior mesenteric artery, superior mesenteric artery and left gastric artery. 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   Electronically Signed   By: Aletta Edouard M.D.   On: 06/25/2014 11:08   Ir Angiogram Visceral Selective  06/25/2014   CLINICAL DATA:  Massive acute lower GI bleed with severe anemia and hypotension. Bleeding scan suggested bleeding from the distal colon.  EXAM: 1. ULTRASOUND GUIDANCE FOR VASCULAR ACCESS OF THE RIGHT COMMON FEMORAL  ARTERY 2. SELECTIVE ARTERIOGRAPHY OF THE INFERIOR MESENTERIC ARTERY 3. SELECTIVE ARTERIOGRAPHY OF THE SUPERIOR MESENTERIC ARTERY 4. SELECTIVE ARTERIOGRAPHY OF THE LEFT GASTRIC ARTERY  MEDICATIONS: 200 mcg intra-arterial nitroglycerin  CONTRAST:  148mL VISIPAQUE IODIXANOL 320 MG/ML IV SOLN  FLUOROSCOPY TIME:  15 min and 48 seconds.  PROCEDURE: The patient was intubated and unresponsive during the procedure. Emergent  consent was obtained from the patient's father for arteriography with possible embolization.  The right groin was prepped with Betadine and draped. Maximal Sterile Barrier Technique was utilized including caps, mask, sterile gowns, sterile gloves, sterile drape, hand hygiene and skin antiseptic.  Ultrasound was used to confirm patency of the right common femoral artery. Access of the artery was performed with a 21 gauge needle under direct ultrasound guidance. After securing access with a micropuncture set, a 5 French sheath was placed over a guidewire.  A 5 [place] cobra catheter was introduced into the abdominal aorta. The inferior mesenteric artery was selectively catheterized. Selective arteriography was performed prior to, and following, administration of 100 mcg intra-arterial nitroglycerin directly into the IMA.  The catheter was then used to selectively catheterize the superior mesenteric artery. Selective arteriography was performed prior to, and following, administration of 100 mcg intra-arterial nitroglycerin.  The catheter was then used to selectively catheterize the left gastric artery. Selective arteriography was performed.  The catheter was removed. Hemostasis was obtained at the arteriotomy with the Cordis ExoSeal device.  FINDINGS: Initial inferior mesenteric arteriography shows severe vasoconstriction, limiting visualization of distal branch vessels. After administration of nitroglycerin, improved visualization of arteries was identified. There is no evidence of active arterial bleeding, vascular malformation or other vascular abnormality within the IMA territory.  SMA arteriography demonstrated similar severe vasoconstriction which did show some improvement after administration of nitroglycerin. Arteriography demonstrates no evidence of active arterial bleeding, vascular malformation or other vascular abnormality.  In attempting catheterization of the celiac axis, selective arteriography was  performed of a left gastric artery emanating from the abdominal aorta. 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 Injection at the time of SMA arteriography was likely distal to the hepatic and splenic arteries.  IMPRESSION: Negative visceral arteriography for arterial bleeding source in the distribution of the inferior mesenteric artery, superior mesenteric artery and left gastric artery. 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   Electronically Signed   By: Aletta Edouard M.D.   On: 06/25/2014 11:08   Ir US Guide Vasc Access Right  06/25/2014   CLINICAL DATA:  Massive acute lower GI bleed with severe anemia and hypotension. Bleeding scan suggested bleeding from the distal colon.  EXAM: 1. ULTRASOUND GUIDANCE FOR VASCULAR ACCESS OF THE RIGHT COMMON FEMORAL ARTERY 2. SELECTIVE ARTERIOGRAPHY OF THE INFERIOR MESENTERIC ARTERY 3. SELECTIVE ARTERIOGRAPHY OF THE SUPERIOR MESENTERIC ARTERY 4. SELECTIVE ARTERIOGRAPHY OF THE LEFT GASTRIC ARTERY  MEDICATIONS: 200 mcg intra-arterial nitroglycerin  CONTRAST:  1102mL VISIPAQUE IODIXANOL 320 MG/ML IV SOLN  FLUOROSCOPY TIME:  15 min and 48 seconds.  PROCEDURE: The patient was intubated and unresponsive during the procedure. Emergent consent was obtained from the patient's father for arteriography with possible embolization.  The right groin was prepped with Betadine and draped. Maximal Sterile Barrier Technique was utilized including caps, mask, sterile gowns, sterile gloves, sterile drape, hand hygiene and skin antiseptic.  Ultrasound was used to confirm patency of the  right common femoral artery. Access of the artery was performed with a 21 gauge needle under direct ultrasound guidance. After securing access with a micropuncture set, a 5 French sheath was placed over a guidewire.  A 5 [place] cobra catheter  was introduced into the abdominal aorta. The inferior mesenteric artery was selectively catheterized. Selective arteriography was performed prior to, and following, administration of 100 mcg intra-arterial nitroglycerin directly into the IMA.  The catheter was then used to selectively catheterize the superior mesenteric artery. Selective arteriography was performed prior to, and following, administration of 100 mcg intra-arterial nitroglycerin.  The catheter was then used to selectively catheterize the left gastric artery. Selective arteriography was performed.  The catheter was removed. Hemostasis was obtained at the arteriotomy with the Cordis ExoSeal device.  FINDINGS: Initial inferior mesenteric arteriography shows severe vasoconstriction, limiting visualization of distal branch vessels. After administration of nitroglycerin, improved visualization of arteries was identified. There is no evidence of active arterial bleeding, vascular malformation or other vascular abnormality within the IMA territory.  SMA arteriography demonstrated similar severe vasoconstriction which did show some improvement after administration of nitroglycerin. Arteriography demonstrates no evidence of active arterial bleeding, vascular malformation or other vascular abnormality.  In attempting catheterization of the celiac axis, selective arteriography was performed of a left gastric artery emanating from the abdominal aorta. 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 Injection at the time of SMA arteriography was likely distal to the hepatic and splenic arteries.  IMPRESSION: Negative visceral arteriography for arterial bleeding source in the distribution of the inferior mesenteric artery, superior mesenteric artery and left gastric artery. Bleeding source may be secondary to prominent mesenteric varices identified by CT which extend to abut  the rectum.   Electronically Signed   By: Aletta Edouard M.D.   On: 06/25/2014 11:08   Dg Chest Port 1 View  06/25/2014   CLINICAL DATA:  Check endotracheal tube  EXAM: PORTABLE CHEST - 1 VIEW  COMPARISON:  Chest x-ray from yesterday  FINDINGS: New endotracheal tube, tip at the clavicular heads. Unchanged positioning of a left IJ catheter.  Lower lung volumes with interstitial crowding and possible left lower lobe segmental atelectasis. There is no evidence of effusion or pneumothorax.  IMPRESSION: 1. New endotracheal tube is in good position. 2. Low lung volumes and basilar atelectasis.   Electronically Signed   By: Jorje Guild M.D.   On: 06/25/2014 03:44   Dg Chest Port 1 View  06/24/2014   CLINICAL DATA:  Post central line placement  EXAM: PORTABLE CHEST - 1 VIEW  COMPARISON:  06/24/2014; 01/21/2014  FINDINGS: Grossly unchanged enlarged cardiac silhouette and mediastinal contours with atherosclerotic plaque within the thoracic aorta. Interval placement of left jugular approach intravenous catheter with tip projected over the central aspect of the left innominate vein. No pneumothorax. Grossly unchanged bilateral infrahilar heterogeneous opacities. No new focal airspace opacities. No pleural effusion. No evidence of edema. Unchanged bones.  IMPRESSION: 1. Interval placement of left jugular approach central venous catheter with tip projected over the central aspect of the left innominate vein. 2. Persistently reduced lung volumes with grossly unchanged infrahilar opacities, likely atelectasis.   Electronically Signed   By: Sandi Mariscal M.D.   On: 06/24/2014 21:46   Dg Chest Portable 1 View  06/24/2014   CLINICAL DATA:  Shortness of breath and hypotension  EXAM: PORTABLE CHEST - 1 VIEW  COMPARISON:  PA and lateral chest x-ray of January 21, 2014  FINDINGS: The lungs are hypoinflated but clear. The cardiac silhouette is mildly enlarged. The pulmonary vascularity is normal. There is no pleural effusion.  The bony thorax is unremarkable.  IMPRESSION: The study is limited due to hypoinflation. There is new enlargement of the cardiac silhouette without evidence of pulmonary edema.   Electronically Signed   By: David  [place]   On: 06/24/2014 11:33   Dg Abd Portable 1v  06/25/2014   CLINICAL DATA:  53 year old male undergoing enteric tube placement.  EXAM: PORTABLE ABDOMEN - 1 VIEW  COMPARISON:  Portable chest radiograph 0309 hr the same day and earlier.  FINDINGS: Two portable AP views of the abdomen at 0959 hrs. Enteric tube projects over the proximal stomach, side hole at the level of the gastric fundus.  Increased dilated gas-filled small bowel loops, measuring up to 39 mm diameter. Similar volume of large bowel gas compared to recent CT Abdomen and Pelvis. Excreted IV contrast in the bladder. No definite pneumoperitoneum. No acute osseous abnormality identified.  IMPRESSION: 1. Enteric tube placed, side hole at the level of the gastric fundus. 2. New small bowel obstruction gas pattern.  No definite free air.   Electronically Signed   By: Lars Pinks M.D.   On: 06/25/2014 10:44     ASSESSMENT / PLAN:  PULMONARY A:  Acute respiratory failure  At risk pulmonary edema / TRALI  P:  PRVC ventilation  CARDIOVASCULAR A:  Hemorrhagic shock Chronic HTN  Diastolic CHF  P:  Volume resuscitate with blood & products Norepi started   GASTROINTESTINAL A:  Acute lower GI bleed - probable sigmoid colon source based on tagged RBC scan Cirrhosis, alcoholic  Hepatitis C Ab positive - ? Old vs new diagnosis Multiple new hepatic lesions noted on CT Abdomen - unclear etiology, DDx includes regenerating nodules, metastatic  disease, multifocal hepatocellular carcinoma.  In addition, note AFP tumor marker is positive > consistent with hepatocellular CA P:  Start octreotide load & infusion  COlonoscopy planned for rectal varices , If none then consider TIPS - obtain RUQ [place] in anticipation Not a surgical  candidate NPO  PPI Future issue will be liver bx of lesions, ? hepatocellular CA   RENAL A:  AoCKD, Stage 3, baseline creatinine 9.9-3.7 AG metabolic acidosis Contrast injury Hypocalcemia P:  Bicarb gtt Give calcium in anticipation  HEMATOLOGIC A:  Acute blood loss anemia  Thrombocytopenia - 94 on admission  Coagulopathy  Massive transfusion P:  Goal hemoglobin > 8  Goal INR <1.5 with FFP & vit K 10 mg IV x3 Goal plts > 50k Transfuse 4U FFP for every 6U PRBCs   INFECTIOUS A:  No evidence of infection  Unclear on prior hepatitis vs. new dx  HIV pending P:  Empiric ceftriaxone given small ascites, risk for SBP Will need ID follow-up outpatient for possible HCV treatment if this is new for him  ENDOCRINE A:  At risk hypoglycemia P:   Monitor glucose on BMET  NEUROLOGIC A:  Alcohol abuse Chronic alcoholism  At risk alcohol withdrawal P:  Versed gtt/ fent gtt, goal RASS -1 Dc propofol Ativan PRN  Thiamine and folate  GLOBAL - Guarded prognosis given to family, Care co-ordinated with GI & surgical consultants  I have personally obtained a history, examined the patient, evaluated laboratory and imaging results, formulated the assessment and plan and placed orders  CRITICAL CARE: The patient is critically ill with multiple organ systems failure and  requires high complexity decision making for assessment and support, frequent evaluation and titration of therapies, application of advanced monitoring technologies and extensive interpretation of multiple databases. Critical Care Time devoted to patient care services described in this note is 45 minutes.   Kara Mead MD. [facility] Flood. [facility] Pager [phone] If no response call [phone]    06/25/2014, 12:01 PM

## 2014-06-25 NOTE — Sedation Documentation (Signed)
Leviophed back down to 2mcgs by floor RN

## 2014-06-25 NOTE — Op Note (Signed)
[facility] [place], [id number]   COLONOSCOPY PROCEDURE REPORT  PATIENT: Edwin, Love  MR#: [id number] BIRTHDATE: March 25, 1961 , 16  yrs. old GENDER: Male ENDOSCOPIST: Inda Castle, MD REFERRED BY: PROCEDURE DATE:  06/25/2014 PROCEDURE:   Colonoscopy with control of bleeding ASA CLASS:   Class III INDICATIONS:Rectal Bleeding. MEDICATIONS:  DESCRIPTION OF PROCEDURE:   After the risks benefits and alternatives of the procedure were thoroughly explained, informed consent was obtained.  A digital rectal exam revealed no abnormalities of the rectum.   The Pentax Adult Colon [id number] endoscope was introduced through the anus and advanced to the terminal ileum which was intubated for a short distance. No adverse events experienced.   The quality of the prep was good, using MiraLax  The instrument was then slowly withdrawn as the colon was fully examined.      COLON FINDINGS: Fresh blood was seen in the distal left colon. Scope was passed to the terminal ileum which was entirely free of blood.  Small amounts of blood was seen throughout the colon. There were few scattered diverticula in the proximal transverse colon and in the sigmoid colon.  In the rectal vault there was a large varix with ulceration of the tip.  Active bleeding was initially seen but then stopped spontaneously.  2 endoscopic clips were placed around the area of ulceration and the base of the varix was injected with 5 cc of ethanolamine.  Soon thereafter the varix started to bleed.  Because of hypotension and arrhythmias scope was withdrawn.  Retroflexed views revealed internal hemorrhoids. The time to cecum=  . Withdrawal time=  .  The scope was withdrawn and the procedure completed.  COMPLICATIONS: Th   With recurrence of severe bleeding the patient became hypotensive and developed an irregular rhythm.  The scope was withdrawn.  The patient was  resuscitated with fluid and CPR was begun.  ENDOSCOPIC IMPRESSION: actively bleeding rectal varix diverticulosis  RECOMMENDATIONS: Following resuscitation patient will be taken to the OR for local control of bleeding and hemostasis  eSigned:  Inda Castle, MD 06/25/2014 4:56 PM   cc:   PATIENT NAME:  Edwin, Love MR#: [id number]

## 2014-06-25 NOTE — Anesthesia Postprocedure Evaluation (Signed)
  Anesthesia Post-op Note  Patient: Edwin Love  Procedure(s) Performed: Procedure(s): ESOPHAGOGASTRODUODENOSCOPY (EGD) (N/A)  Patient Location: PACU  Anesthesia Type:MAC  Level of Consciousness: awake and alert   Airway and Oxygen Therapy: Patient Spontanous Breathing  Post-op Pain: none  Post-op Assessment: Post-op Vital signs reviewed, Patient's Cardiovascular Status Stable, Respiratory Function Stable, Patent Airway, No signs of Nausea or vomiting and Pain level controlled  Post-op Vital Signs: Reviewed and stable  Last Vitals:  Filed Vitals:   06/25/14 1215  BP:   Pulse: 95  Temp:   Resp: 18    Complications: No apparent anesthesia complications

## 2014-06-25 NOTE — Progress Notes (Signed)
[facility] Progress Note Patient Name: Edwin Love DOB: 1961/08/12 MRN: [id number]  Date of Service  06/25/2014   HPI/Events of Note   Acidosis Acute blood loss anemia   eICU Interventions   RR 30 ABG 1 hour CBC 1 hour    Intervention Category Major Interventions: Other:  Anneliese Leblond 06/25/2014, 6:01 PM

## 2014-06-25 NOTE — Progress Notes (Signed)
Colonoscopy demonstrated an actively bleeding varix in the rectal vault.  Attempts at sclerotherapy and clipping were unsuccessful.  Because of severe bleeding and hypotension surgery was contacted for the purpose of trying to obtain hemostasis.  Any measure will be temporizing, however.  Ultimately a TIPS procedure and embolization of the rectal varices could provide definitive therapy.

## 2014-06-25 NOTE — Sedation Documentation (Signed)
Moved to bed, Transported back to 7M with floor RN

## 2014-06-25 NOTE — Progress Notes (Signed)
PULMONARY / CRITICAL CARE MEDICINE   Name: Edwin Love MRN: [id number] DOB: 1961-12-10    ADMISSION DATE: 06/24/2014  CONSULTATION DATE: 06/24/2014   REFERRING MD : [name]  PRIMARY SERVICE: PCCM   CHIEF COMPLAINT: Lower GI Hemorrhage   BRIEF PATIENT DESCRIPTION: 53 y.o. M with cirrhosis and esophageal varices brought to ED after being found lying in pool of blood by family. Underwent EGD 7/16 which was negative for UGIB; however, pt continued to have massive hematochezia. Bleeding scan positive for LGIB in sigmoid colon. PCCM consulted and pt admitted to ICU.   SIGNIFICANT EVENTS / STUDIES:  7/16 Abd CT >>> cirrhosis, multiple new hepatic lesions are noted, partial thrombosis of portal vein, portal HTN with multiple varices, periportal adenopathy cannot be excluded, umbilical hernia with herniation of small bowel, extensive small bowel wall edema. 7/16 EGD >>> varices present; however, no acute bleeding.  7/16 Bleeding Scan >>> positive for acute lower GI bleed with source likely within the sigmoid colon.  7/17 Continued brisk BRBPR, shock, altered MS 7/17 Intubated  LINES / TUBES:  Left IJ large bore TLC 7/16 >>>  ETT 7/17 >>   CULTURES:  7/16 HIV Ab >>> 7/16 HCV Ab >>> POS  7/16 AFP >>> POS  ANTIBIOTICS:  Ceftriaxone 7/17 >>   INTERVAL EVENTS: Pt with continued and brisk BRBPR with large clots. Has received PRBC x2, FFP x2, currently infusing PRBC 3+4.  Worsening MS and hemodynamically unstable > norepi to be started  Required intubation    VITAL SIGNS: Temp:  [97.3 F (36.3 C)-99.3 F (37.4 C)] 99.2 F (37.3 C) (07/17 0210) Pulse Rate:  [75-130] 124 (07/17 0210) Resp:  [12-31] 30 (07/17 0210) BP: (75-163)/(34-98) 122/69 mmHg (07/17 0200) SpO2:  [81 %-100 %] 100 % (07/17 0210) Arterial Line BP: (100-179)/(28-63) 143/51 mmHg (07/17 0210) Weight:  [86.183 kg (190 lb)] 86.183 kg (190 lb) (07/16 1443) HEMODYNAMICS:   VENTILATOR SETTINGS:   INTAKE /  OUTPUT: Intake/Output     07/16 0701 - 07/17 0700   I.V. (mL/kg) 2328.8 (27)   Blood 1297   Total Intake(mL/kg) 3625.8 (42.1)   Urine (mL/kg/hr) 500   Total Output 500   Net +3125.8        PHYSICAL EXAMINATION: General: Ill-appearing, poorly responsive HEENT: NCAT, MMM, enucleated left eye  Cardiovascular: Regular rate, 1+ flow murmur  Respiratory: Non-labored, 100% on O2, CTA anteriorly  Abdomen: +BS, + easily reducible umbilical hernia, NT, ND, soft  Extremities: No obvious deformities  Skin: Pale, no wounds noted  Neuro: Difficult to wake, no longer interacting. No focal deficits in strength (diffusely weak)   LABS:  CBC  Recent Labs Lab 06/24/14 1100 06/24/14 1114 06/25/14 0300  WBC 8.5  --  10.7*  HGB 6.2* 6.8* 4.5*  HCT 18.2* 20.0* 13.6*  PLT 94*  --  62*   Coag's  Recent Labs Lab 06/24/14 1100  APTT 33  INR 1.87*   BMET  Recent Labs Lab 06/24/14 1100 06/24/14 1114  NA 142 143  K 4.3 4.1  CL 110 114*  CO2 11*  --   BUN 15 14  CREATININE 1.61* 1.90*  GLUCOSE 130* 124*   Electrolytes  Recent Labs Lab 06/24/14 1100  CALCIUM 7.0*   Sepsis Markers No results found for this basename: LATICACIDVEN, PROCALCITON, O2SATVEN,  in the last 168 hours ABG No results found for this basename: PHART, PCO2ART, PO2ART,  in the last 168 hours Liver Enzymes  Recent Labs Lab 06/24/14 1100  AST 71*  ALT 20  ALKPHOS 85  BILITOT 0.4  ALBUMIN 1.4*   Cardiac Enzymes No results found for this basename: TROPONINI, PROBNP,  in the last 168 hours Glucose  Recent Labs Lab 06/24/14 2030  GLUCAP 130*    Imaging Nm Gi Blood Loss  06/24/2014   CLINICAL DATA:  Bloody stools since yesterday. Evaluate for GI bleed.  EXAM: NUCLEAR MEDICINE GASTROINTESTINAL BLEEDING SCAN  TECHNIQUE: Sequential abdominal images were obtained following intravenous administration of Tc-32m labeled red blood cells.  RADIOPHARMACEUTICALS:  25 mCi Tc-36m in-vitro labeled red cells.   COMPARISON:  CT the abdomen pelvis - 06/24/2014; 02/08/2014  FINDINGS: The examination is degraded secondary to patient motion artifact. There is a faint ill-defined area of persistent radiotracer activity within the right lower abdominal quadrant seen on the initial 60 min of planar imaging. There is a rapid worsening of this radiotracer seen on the provided 110 min anterior projection planar images with subsequent retrograde flow into the distal sigmoid, ascending and transverse colon.  Expected physiologic activity is seen within the hepatic parenchyma an intravascular blood pool of the abdomen and pelvis. Excreted free radiotracer is seen within the urinary bladder.  IMPRESSION: Examination is positive for acute lower GI bleed with source likely within the sigmoid colon.  Critical Value/emergent results were called by telephone at the time of interpretation on 06/24/2014 at 8:06 pm to Dr. Zenovia Jarred, who verbally acknowledged these results.   Electronically Signed   By: Sandi Mariscal M.D.   On: 06/24/2014 20:22   Ct Abdomen Pelvis W Contrast  06/24/2014   CLINICAL DATA:  GI bleeding.  Pain.  EXAM: CT ABDOMEN AND PELVIS WITH CONTRAST  TECHNIQUE: Multidetector CT imaging of the abdomen and pelvis was performed using the standard protocol following bolus administration of intravenous contrast.  CONTRAST:  20mL OMNIPAQUE IOHEXOL 300 MG/ML  SOLN  COMPARISON:  CT 02/08/2014.  FINDINGS: The liver has an irregular contour suggesting cirrhosis. Multiple new hepatic low-density lesions are noted. The largest measures 2.6 cm, image number 18/series 2. Although these could represent regenerating nodules, metastatic disease or multifocal hepatocellular carcinoma cannot be excluded. There is a new enhancing 2.2 cm nodule in the dome of the liver with similar differential, image number 12/series 2. Spleen is normal. Perigastric and periportal serpiginous densities are noted most likely varices. Periportal adenopathy cannot be  excluded. Partial thrombosis of the portal vein noted. Multiple retroperitoneal varices are present. Varicosities of the mesenteric venous system noted. Pelvic varicosities particularly prominent. Splenic vein patent. Hepatic veins patent. No biliary distention. Gallbladder wall edema noted, this may be from hypoproteinemia. Mild ascites present.  Adrenals normal. No focal renal abnormality. No hydronephrosis or obstructing ureteral stone. The bladder is unremarkable. Calcification within the prostate.  No significant adenopathy. Aorta normal caliber with atherosclerotic vascular changes. Celiac and mesenteric arteries patent.  Appendix normal. Stool noted throughout the colon. Umbilical hernia is noted with what appears to be herniated small bowel. Mild proximal and mid small bowel distention is present. This could be secondary to partial obstruction. Diffuse small bowel wall edema noted. Although edema could be secondary to the hernia and partial obstruction, small bowel wall edema may also be secondary to hyperproteinemia and vascular congestion given the patient's partial portal vein thrombosis and varices. No pneumatosis intestinalis. No portal venous air. No free air.  Lung bases clear. Coronary artery disease. Mild cardiomegaly. Degenerative changes lumbar spine and both hips.  IMPRESSION: 1. Liver appears cirrhotic. Multiple new hepatic lesions are noted. Differential diagnosis  includes regenerating nodules, metastatic disease, multifocal hepatocellular carcinoma. 2. Partial thrombosis of the portal vein. 3. Portal hypertension with multiple varices. 4. Periportal adenopathy cannot be excluded. 5. Umbilical hernia with herniation of small bowel. Partial small bowel obstruction cannot be excluded. 6. Extensive small bowel wall edema, although this could be related to partial small bowel obstruction, hyperproteinemia and vascular congestion could also result in bowel wall edema. No pneumatosis intestinalis. No  portal venous air. No free air.   Electronically Signed   By: Marcello Moores  Register   On: 06/24/2014 13:03   Dg Chest Port 1 View  06/24/2014   CLINICAL DATA:  Post central line placement  EXAM: PORTABLE CHEST - 1 VIEW  COMPARISON:  06/24/2014; 01/21/2014  FINDINGS: Grossly unchanged enlarged cardiac silhouette and mediastinal contours with atherosclerotic plaque within the thoracic aorta. Interval placement of left jugular approach intravenous catheter with tip projected over the central aspect of the left innominate vein. No pneumothorax. Grossly unchanged bilateral infrahilar heterogeneous opacities. No new focal airspace opacities. No pleural effusion. No evidence of edema. Unchanged bones.  IMPRESSION: 1. Interval placement of left jugular approach central venous catheter with tip projected over the central aspect of the left innominate vein. 2. Persistently reduced lung volumes with grossly unchanged infrahilar opacities, likely atelectasis.   Electronically Signed   By: Sandi Mariscal M.D.   On: 06/24/2014 21:46   Dg Chest Portable 1 View  06/24/2014   CLINICAL DATA:  Shortness of breath and hypotension  EXAM: PORTABLE CHEST - 1 VIEW  COMPARISON:  PA and lateral chest x-ray of January 21, 2014  FINDINGS: The lungs are hypoinflated but clear. The cardiac silhouette is mildly enlarged. The pulmonary vascularity is normal. There is no pleural effusion. The bony thorax is unremarkable.  IMPRESSION: The study is limited due to hypoinflation. There is new enlargement of the cardiac silhouette without evidence of pulmonary edema.   Electronically Signed   By: David  Martinique   On: 06/24/2014 11:33     ASSESSMENT / PLAN:  PULMONARY A:  Acute respiratory failure  At risk pulmonary edema / TRALI  P:  ETT for airway protection and overall instability  CARDIOVASCULAR A:  Hemorrhagic shock Chronic HTN  Diastolic CHF  P:  Continue PRBC's and fluids Norepi started Trend troponin.   GASTROINTESTINAL A:   Acute lower GI bleed - probable sigmoid colon source based on tagged RBC scan Cirrhosis, alcoholic  Hepatitis C Ab positive - ? Old vs new diagnosis Multiple new hepatic lesions noted on CT Abdomen - unclear etiology, DDx includes regenerating nodules, metastatic  disease, multifocal hepatocellular carcinoma.  In addition, note AFP tumor marker is positive > consistent with hepatocellular CA P:  Have discussed the case with Dr Hilarie Fredrickson at bedside, Dr Kathlene Cote has been notified about pt's clinical decline. Dr Alva Garnet in [place] has reviewed the case with Dr Brantley Stage with [facility] > IR is the modality of choice here as the precise sensitivity of RBC to determine source of bleeding and guide surgery is suspect. Will plan to stabilize pt, send to IR when Dr Kathlene Cote is ready to proceed.  Consider octreotide given possibility that he is having small bowel variceal bleeding. Will defer for now, but ready to start depending on IR findings.  NPO  PPI Future issue will be liver bx of lesions, ? hepatocellular CA   RENAL A:  AoCKD, Stage 3, baseline creatinine 0.9-1.4, 1.6 on admission  P:  BP support Monitor BMET Understandable concern regarding dye load in  IR, but his instability will necessitate angiography  HEMATOLOGIC A:  Acute blood loss anemia - s/p 2u pRBCs so far, 2 more infusing Thrombocytopenia - 94 on admission  Coagulopathy  P:  Follow H/H, coags serial labs Will order PRBC units 5&6 4 units FFP per GI Goal hemoglobin > 8   INFECTIOUS A:  No evidence of infection  Unclear on prior hepatitis vs. new dx  HIV pending P:  Empiric ceftriaxone given small ascites, risk for SBP Will need ID follow-up outpatient for possible HCV treatment if this is new for him  ENDOCRINE A:   No acute issues   P:   Monitor glucose on BMET  NEUROLOGIC A:  Alcohol abuse Chronic alcoholism  At risk alcohol withdrawal P:  Ativan PRN  Thiamine and folate   I have personally obtained a history,  examined the patient, evaluated laboratory and imaging results, formulated the assessment and plan and placed orders  CRITICAL CARE: The patient is critically ill with multiple organ systems failure and requires high complexity decision making for assessment and support, frequent evaluation and titration of therapies, application of advanced monitoring technologies and extensive interpretation of multiple databases. Critical Care Time devoted to patient care services described in this note is 45 minutes.    Baltazar Apo, MD, PhD 06/25/2014, 3:19 AM [facility] [phone] or if no answer [phone]

## 2014-06-25 NOTE — Sedation Documentation (Signed)
Cuff BP 137/62

## 2014-06-26 ENCOUNTER — Inpatient Hospital Stay (HOSPITAL_COMMUNITY): Payer: Medicaid Other

## 2014-06-26 DIAGNOSIS — N179 Acute kidney failure, unspecified: Secondary | ICD-10-CM

## 2014-06-26 DIAGNOSIS — J9600 Acute respiratory failure, unspecified whether with hypoxia or hypercapnia: Secondary | ICD-10-CM

## 2014-06-26 DIAGNOSIS — R578 Other shock: Secondary | ICD-10-CM

## 2014-06-26 LAB — CBC
HCT: 20.9 % — ABNORMAL LOW (ref 39.0–52.0)
HEMATOCRIT: 20.7 % — AB (ref 39.0–52.0)
HEMATOCRIT: 18.5 % — AB (ref 39.0–52.0)
HEMOGLOBIN: 7.3 g/dL — AB (ref 13.0–17.0)
Hemoglobin: 7.4 g/dL — ABNORMAL LOW (ref 13.0–17.0)
Hemoglobin: 6.7 g/dL — CL (ref 13.0–17.0)
MCH: 28.6 pg (ref 26.0–34.0)
MCH: 28.5 pg (ref 26.0–34.0)
MCH: 29.5 pg (ref 26.0–34.0)
MCHC: 35.4 g/dL (ref 30.0–36.0)
MCHC: 35.3 g/dL (ref 30.0–36.0)
MCHC: 36.2 g/dL — AB (ref 30.0–36.0)
MCV: 80.7 fL (ref 78.0–100.0)
MCV: 80.9 fL (ref 78.0–100.0)
MCV: 81.5 fL (ref 78.0–100.0)
Platelets: 51 10*3/uL — ABNORMAL LOW (ref 150–400)
Platelets: 62 10*3/uL — ABNORMAL LOW (ref 150–400)
Platelets: 74 10*3/uL — ABNORMAL LOW (ref 150–400)
RBC: 2.59 MIL/uL — AB (ref 4.22–5.81)
RBC: 2.56 MIL/uL — ABNORMAL LOW (ref 4.22–5.81)
RBC: 2.27 MIL/uL — ABNORMAL LOW (ref 4.22–5.81)
RDW: 15.8 % — ABNORMAL HIGH (ref 11.5–15.5)
RDW: 16.1 % — AB (ref 11.5–15.5)
RDW: 16.4 % — ABNORMAL HIGH (ref 11.5–15.5)
WBC: 12.3 10*3/uL — AB (ref 4.0–10.5)
WBC: 14 10*3/uL — ABNORMAL HIGH (ref 4.0–10.5)
WBC: 11.9 10*3/uL — AB (ref 4.0–10.5)

## 2014-06-26 LAB — PREPARE FRESH FROZEN PLASMA
UNIT DIVISION: 0
UNIT DIVISION: 0
UNIT DIVISION: 0
Unit division: 0
Unit division: 0
Unit division: 0
Unit division: 0
Unit division: 0
Unit division: 0
Unit division: 0

## 2014-06-26 LAB — PREPARE PLATELET PHERESIS: Unit division: 0

## 2014-06-26 LAB — BASIC METABOLIC PANEL
ANION GAP: 31 — AB (ref 5–15)
BUN: 30 mg/dL — ABNORMAL HIGH (ref 6–23)
CALCIUM: 6.5 mg/dL — AB (ref 8.4–10.5)
CHLORIDE: 105 meq/L (ref 96–112)
CO2: 9 mEq/L — CL (ref 19–32)
Creatinine, Ser: 3.5 mg/dL — ABNORMAL HIGH (ref 0.50–1.35)
GFR calc non Af Amer: 18 mL/min — ABNORMAL LOW (ref >90)
GFR, EST AFRICAN AMERICAN: 21 mL/min — AB (ref >90)
Glucose, Bld: 105 mg/dL — ABNORMAL HIGH (ref 70–99)
Potassium: 4.8 mEq/L (ref 3.7–5.3)
SODIUM: 145 meq/L (ref 137–147)

## 2014-06-26 LAB — FIBRINOGEN: Fibrinogen: 161 mg/dL — ABNORMAL LOW (ref 204–475)

## 2014-06-26 LAB — POCT I-STAT 3, ART BLOOD GAS (G3+)
Acid-base deficit: 13 mmol/L — ABNORMAL HIGH (ref 0.0–2.0)
Bicarbonate: 11.4 mEq/L — ABNORMAL LOW (ref 20.0–24.0)
O2 Saturation: 99 %
PCO2 ART: 20.3 mmHg — AB (ref 35.0–45.0)
PH ART: 7.359 (ref 7.350–7.450)
PO2 ART: 134 mmHg — AB (ref 80.0–100.0)
Patient temperature: 98.7
TCO2: 12 mmol/L (ref 0–100)

## 2014-06-26 LAB — PROTIME-INR
INR: 2.59 — ABNORMAL HIGH (ref 0.00–1.49)
INR: 2.53 — AB (ref 0.00–1.49)
PROTHROMBIN TIME: 27.8 s — AB (ref 11.6–15.2)
PROTHROMBIN TIME: 27.3 s — AB (ref 11.6–15.2)

## 2014-06-26 LAB — CALCIUM, IONIZED: CALCIUM ION: 0.85 mmol/L — AB (ref 1.12–1.23)

## 2014-06-26 LAB — HEPATITIS C ANTIBODY (REFLEX): HCV AB: REACTIVE — AB

## 2014-06-26 LAB — HIV ANTIBODY (ROUTINE TESTING W REFLEX): HIV 1&2 Ab, 4th Generation: NONREACTIVE

## 2014-06-26 MED ORDER — ALBUTEROL SULFATE (2.5 MG/3ML) 0.083% IN NEBU: 2.5000 mg | INHALATION_SOLUTION | RESPIRATORY_TRACT | Status: DC | PRN

## 2014-06-26 NOTE — Progress Notes (Signed)
[facility] Progress Note Patient Name: Edwin Love DOB: 11-06-1961 MRN: [id number]  Date of Service  06/26/2014   HPI/Events of Note   Hb 6.7 INR remains high  eICU Interventions  1 u PRBC 4 U FFP   Intervention Category Intermediate Interventions: Bleeding - evaluation and treatment with blood products  Ahlivia Salahuddin V. 06/26/2014, 8:36 PM

## 2014-06-26 NOTE — Progress Notes (Signed)
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  Objective: Vital signs in last 24 hours: Temp:  [97.1 F (36.2 C)-99.1 F (37.3 C)] 98.7 F (37.1 C) (07/18 1505) Pulse Rate:  [54-127] 100 (07/18 1520) Resp:  [14-31] 26 (07/18 1520) BP: (34-178)/(14-89) 147/54 mmHg (07/18 1520) SpO2:  [91 %-100 %] 100 % (07/18 1520) Arterial Line BP: (47-167)/(25-81) 155/62 mmHg (07/18 1505) FiO2 (%):  [40 %-100 %] 40 % (07/18 1520) Weight:  [205 lb 14.6 oz (93.4 kg)] 205 lb 14.6 oz (93.4 kg) (07/18 0500) Last BM Date: 06/26/14  Intake/Output from previous day: 07/17 0701 - 07/18 0700 In: 7065.2 [I.V.:4417.2; Blood:2020; IV Piggyback:628] Out: 200 [Blood:200] Intake/Output this shift: Total I/O In: 2114.1 [I.V.:1789.1; Blood:325] Out: 200 [Urine:200]    Lab Results:   Recent Labs  06/26/14 0235 06/26/14 0815  WBC 12.3* 14.0*  HGB 7.4* 7.3*  HCT 20.9* 20.7*  PLT 51* 62*   BMET  Recent Labs  06/25/14 2130 06/26/14 0509  NA 145 145  K 4.4 4.8  CL 107 105  CO2 11* 9*  GLUCOSE 122* 105*  BUN 26* 30*  CREATININE 2.89* 3.50*  CALCIUM 6.6* 6.5*   PT/INR  Recent Labs  06/25/14 2130 06/26/14 0509  LABPROT 25.7* 27.8*  INR 2.35* 2.59*   ABG  Recent Labs  06/25/14 1930 06/26/14 0857  PHART 7.314* 7.359  HCO3 12.5* 11.4*    Studies/Results: Nm Gi Blood Loss  06/24/2014   CLINICAL DATA:  Bloody stools since yesterday. Evaluate for GI bleed.  EXAM: NUCLEAR MEDICINE GASTROINTESTINAL BLEEDING SCAN  TECHNIQUE: Sequential abdominal images were obtained following intravenous administration of Tc-72m labeled red blood cells.  RADIOPHARMACEUTICALS:  25 mCi Tc-23m in-vitro labeled red cells.  COMPARISON:  CT the abdomen pelvis - 06/24/2014; 02/08/2014  FINDINGS: The examination is degraded secondary to patient motion artifact. There is a faint ill-defined area of persistent radiotracer activity within the  right lower abdominal quadrant seen on the initial 60 min of planar imaging. There is a rapid worsening of this radiotracer seen on the provided 110 min anterior projection planar images with subsequent retrograde flow into the distal sigmoid, ascending and transverse colon.  Expected physiologic activity is seen within the hepatic parenchyma an intravascular blood pool of the abdomen and pelvis. Excreted free radiotracer is seen within the urinary bladder.  IMPRESSION: Examination is positive for acute lower GI bleed with source likely within the sigmoid colon.  Critical Value/emergent results were called by telephone at the time of interpretation on 06/24/2014 at 8:06 pm to Dr. Zenovia Jarred, who verbally acknowledged these results.   Electronically Signed   By: Sandi Mariscal M.D.   On: 06/24/2014 20:22   Ir Angiogram Visceral Selective  06/25/2014   CLINICAL DATA:  Massive acute lower GI bleed with severe anemia and hypotension. Bleeding scan suggested bleeding from the distal colon.  EXAM: 1. ULTRASOUND GUIDANCE FOR VASCULAR ACCESS OF THE RIGHT COMMON FEMORAL ARTERY 2. SELECTIVE ARTERIOGRAPHY OF THE INFERIOR MESENTERIC ARTERY 3. SELECTIVE ARTERIOGRAPHY OF THE SUPERIOR MESENTERIC ARTERY 4. SELECTIVE ARTERIOGRAPHY OF THE LEFT GASTRIC ARTERY  MEDICATIONS: 200 mcg intra-arterial nitroglycerin  CONTRAST:  164mL VISIPAQUE IODIXANOL 320 MG/ML IV SOLN  FLUOROSCOPY TIME:  15 min and 48 seconds.  PROCEDURE: The patient was intubated and unresponsive during the procedure. Emergent consent was obtained from the patient's father for arteriography with possible embolization.  The right groin was prepped with Betadine and draped. Maximal Sterile  Barrier Technique was utilized including caps, mask, sterile gowns, sterile gloves, sterile drape, hand hygiene and skin antiseptic.  Ultrasound was used to confirm patency of the right common femoral artery. Access of the artery was performed with a 21 gauge needle under direct  ultrasound guidance. After securing access with a micropuncture set, a 5 French sheath was placed over a guidewire.  A 5 [place] cobra catheter was introduced into the abdominal aorta. The inferior mesenteric artery was selectively catheterized. Selective arteriography was performed prior to, and following, administration of 100 mcg intra-arterial nitroglycerin directly into the IMA.  The catheter was then used to selectively catheterize the superior mesenteric artery. Selective arteriography was performed prior to, and following, administration of 100 mcg intra-arterial nitroglycerin.  The catheter was then used to selectively catheterize the left gastric artery. Selective arteriography was performed.  The catheter was removed. Hemostasis was obtained at the arteriotomy with the Cordis ExoSeal device.  FINDINGS: Initial inferior mesenteric arteriography shows severe vasoconstriction, limiting visualization of distal branch vessels. After administration of nitroglycerin, improved visualization of arteries was identified. There is no evidence of active arterial bleeding, vascular malformation or other vascular abnormality within the IMA territory.  SMA arteriography demonstrated similar severe vasoconstriction which did show some improvement after administration of nitroglycerin. Arteriography demonstrates no evidence of active arterial bleeding, vascular malformation or other vascular abnormality.  In attempting catheterization of the celiac axis, selective arteriography was performed of a left gastric artery emanating from the abdominal aorta. 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 Injection at the time of SMA arteriography was likely distal to the hepatic and splenic arteries.  IMPRESSION: Negative visceral arteriography for arterial bleeding source in the distribution of the inferior mesenteric artery, superior  mesenteric artery and left gastric artery. 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   Electronically Signed   By: Aletta Edouard M.D.   On: 06/25/2014 11:08   Ir Angiogram Visceral Selective  06/25/2014   CLINICAL DATA:  Massive acute lower GI bleed with severe anemia and hypotension. Bleeding scan suggested bleeding from the distal colon.  EXAM: 1. ULTRASOUND GUIDANCE FOR VASCULAR ACCESS OF THE RIGHT COMMON FEMORAL ARTERY 2. SELECTIVE ARTERIOGRAPHY OF THE INFERIOR MESENTERIC ARTERY 3. SELECTIVE ARTERIOGRAPHY OF THE SUPERIOR MESENTERIC ARTERY 4. SELECTIVE ARTERIOGRAPHY OF THE LEFT GASTRIC ARTERY  MEDICATIONS: 200 mcg intra-arterial nitroglycerin  CONTRAST:  174mL VISIPAQUE IODIXANOL 320 MG/ML IV SOLN  FLUOROSCOPY TIME:  15 min and 48 seconds.  PROCEDURE: The patient was intubated and unresponsive during the procedure. Emergent consent was obtained from the patient's father for arteriography with possible embolization.  The right groin was prepped with Betadine and draped. Maximal Sterile Barrier Technique was utilized including caps, mask, sterile gowns, sterile gloves, sterile drape, hand hygiene and skin antiseptic.  Ultrasound was used to confirm patency of the right common femoral artery. Access of the artery was performed with a 21 gauge needle under direct ultrasound guidance. After securing access with a micropuncture set, a 5 French sheath was placed over a guidewire.  A 5 [place] cobra catheter was introduced into the abdominal aorta. The inferior mesenteric artery was selectively catheterized. Selective arteriography was performed prior to, and following, administration of 100 mcg intra-arterial nitroglycerin directly into the IMA.  The catheter was then used to selectively catheterize the superior mesenteric artery. Selective arteriography was  performed prior to, and following, administration of 100 mcg intra-arterial  nitroglycerin.  The catheter was then used to selectively catheterize the left gastric artery. Selective arteriography was performed.  The catheter was removed. Hemostasis was obtained at the arteriotomy with the Cordis ExoSeal device.  FINDINGS: Initial inferior mesenteric arteriography shows severe vasoconstriction, limiting visualization of distal branch vessels. After administration of nitroglycerin, improved visualization of arteries was identified. There is no evidence of active arterial bleeding, vascular malformation or other vascular abnormality within the IMA territory.  SMA arteriography demonstrated similar severe vasoconstriction which did show some improvement after administration of nitroglycerin. Arteriography demonstrates no evidence of active arterial bleeding, vascular malformation or other vascular abnormality.  In attempting catheterization of the celiac axis, selective arteriography was performed of a left gastric artery emanating from the abdominal aorta. 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 Injection at the time of SMA arteriography was likely distal to the hepatic and splenic arteries.  IMPRESSION: Negative visceral arteriography for arterial bleeding source in the distribution of the inferior mesenteric artery, superior mesenteric artery and left gastric artery. 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   Electronically Signed   By: Aletta Edouard M.D.   On: 06/25/2014 11:08   Ir Angiogram Visceral Selective  06/25/2014   CLINICAL DATA:  Massive acute lower GI bleed with severe anemia and hypotension. Bleeding scan suggested bleeding from the distal colon.  EXAM: 1. ULTRASOUND GUIDANCE FOR VASCULAR ACCESS OF THE RIGHT COMMON FEMORAL ARTERY 2. SELECTIVE ARTERIOGRAPHY OF THE INFERIOR MESENTERIC ARTERY 3. SELECTIVE  ARTERIOGRAPHY OF THE SUPERIOR MESENTERIC ARTERY 4. SELECTIVE ARTERIOGRAPHY OF THE LEFT GASTRIC ARTERY  MEDICATIONS: 200 mcg intra-arterial nitroglycerin  CONTRAST:  156mL VISIPAQUE IODIXANOL 320 MG/ML IV SOLN  FLUOROSCOPY TIME:  15 min and 48 seconds.  PROCEDURE: The patient was intubated and unresponsive during the procedure. Emergent consent was obtained from the patient's father for arteriography with possible embolization.  The right groin was prepped with Betadine and draped. Maximal Sterile Barrier Technique was utilized including caps, mask, sterile gowns, sterile gloves, sterile drape, hand hygiene and skin antiseptic.  Ultrasound was used to confirm patency of the right common femoral artery. Access of the artery was performed with a 21 gauge needle under direct ultrasound guidance. After securing access with a micropuncture set, a 5 French sheath was placed over a guidewire.  A 5 [place] cobra catheter was introduced into the abdominal aorta. The inferior mesenteric artery was selectively catheterized. Selective arteriography was performed prior to, and following, administration of 100 mcg intra-arterial nitroglycerin directly into the IMA.  The catheter was then used to selectively catheterize the superior mesenteric artery. Selective arteriography was performed prior to, and following, administration of 100 mcg intra-arterial nitroglycerin.  The catheter was then used to selectively catheterize the left gastric artery. Selective arteriography was performed.  The catheter was removed. Hemostasis was obtained at the arteriotomy with the Cordis ExoSeal device.  FINDINGS: Initial inferior mesenteric arteriography shows severe vasoconstriction, limiting visualization of distal branch vessels. After administration of nitroglycerin, improved visualization of arteries was identified. There is no evidence of active arterial bleeding, vascular malformation or other vascular abnormality within the IMA territory.   SMA arteriography demonstrated similar severe vasoconstriction which did show some improvement after administration of nitroglycerin. Arteriography demonstrates no evidence of active arterial bleeding, vascular malformation or  other vascular abnormality.  In attempting catheterization of the celiac axis, selective arteriography was performed of a left gastric artery emanating from the abdominal aorta. 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 Injection at the time of SMA arteriography was likely distal to the hepatic and splenic arteries.  IMPRESSION: Negative visceral arteriography for arterial bleeding source in the distribution of the inferior mesenteric artery, superior mesenteric artery and left gastric artery. 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   Electronically Signed   By: Aletta Edouard M.D.   On: 06/25/2014 11:08   Ir US Guide Vasc Access Right  06/25/2014   CLINICAL DATA:  Massive acute lower GI bleed with severe anemia and hypotension. Bleeding scan suggested bleeding from the distal colon.  EXAM: 1. ULTRASOUND GUIDANCE FOR VASCULAR ACCESS OF THE RIGHT COMMON FEMORAL ARTERY 2. SELECTIVE ARTERIOGRAPHY OF THE INFERIOR MESENTERIC ARTERY 3. SELECTIVE ARTERIOGRAPHY OF THE SUPERIOR MESENTERIC ARTERY 4. SELECTIVE ARTERIOGRAPHY OF THE LEFT GASTRIC ARTERY  MEDICATIONS: 200 mcg intra-arterial nitroglycerin  CONTRAST:  16mL VISIPAQUE IODIXANOL 320 MG/ML IV SOLN  FLUOROSCOPY TIME:  15 min and 48 seconds.  PROCEDURE: The patient was intubated and unresponsive during the procedure. Emergent consent was obtained from the patient's father for arteriography with possible embolization.  The right groin was prepped with Betadine and draped. Maximal Sterile Barrier Technique was utilized including caps, mask, sterile gowns, sterile gloves,  sterile drape, hand hygiene and skin antiseptic.  Ultrasound was used to confirm patency of the right common femoral artery. Access of the artery was performed with a 21 gauge needle under direct ultrasound guidance. After securing access with a micropuncture set, a 5 French sheath was placed over a guidewire.  A 5 [place] cobra catheter was introduced into the abdominal aorta. The inferior mesenteric artery was selectively catheterized. Selective arteriography was performed prior to, and following, administration of 100 mcg intra-arterial nitroglycerin directly into the IMA.  The catheter was then used to selectively catheterize the superior mesenteric artery. Selective arteriography was performed prior to, and following, administration of 100 mcg intra-arterial nitroglycerin.  The catheter was then used to selectively catheterize the left gastric artery. Selective arteriography was performed.  The catheter was removed. Hemostasis was obtained at the arteriotomy with the Cordis ExoSeal device.  FINDINGS: Initial inferior mesenteric arteriography shows severe vasoconstriction, limiting visualization of distal branch vessels. After administration of nitroglycerin, improved visualization of arteries was identified. There is no evidence of active arterial bleeding, vascular malformation or other vascular abnormality within the IMA territory.  SMA arteriography demonstrated similar severe vasoconstriction which did show some improvement after administration of nitroglycerin. Arteriography demonstrates no evidence of active arterial bleeding, vascular malformation or other vascular abnormality.  In attempting catheterization of the celiac axis, selective arteriography was performed of a left gastric artery emanating from the abdominal aorta. 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 Injection at the time of SMA arteriography  was likely distal to the hepatic and splenic arteries.  IMPRESSION: Negative visceral arteriography for arterial bleeding source in the distribution of the inferior mesenteric artery, superior mesenteric artery and left gastric artery. 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   Electronically Signed  By: Aletta Edouard M.D.   On: 06/25/2014 11:08   Dg Chest Port 1 View  06/25/2014   CLINICAL DATA:  Check endotracheal tube  EXAM: PORTABLE CHEST - 1 VIEW  COMPARISON:  Chest x-ray from yesterday  FINDINGS: New endotracheal tube, tip at the clavicular heads. Unchanged positioning of a left IJ catheter.  Lower lung volumes with interstitial crowding and possible left lower lobe segmental atelectasis. There is no evidence of effusion or pneumothorax.  IMPRESSION: 1. New endotracheal tube is in good position. 2. Low lung volumes and basilar atelectasis.   Electronically Signed   By: Jorje Guild M.D.   On: 06/25/2014 03:44   Dg Chest Port 1 View  06/24/2014   CLINICAL DATA:  Post central line placement  EXAM: PORTABLE CHEST - 1 VIEW  COMPARISON:  06/24/2014; 01/21/2014  FINDINGS: Grossly unchanged enlarged cardiac silhouette and mediastinal contours with atherosclerotic plaque within the thoracic aorta. Interval placement of left jugular approach intravenous catheter with tip projected over the central aspect of the left innominate vein. No pneumothorax. Grossly unchanged bilateral infrahilar heterogeneous opacities. No new focal airspace opacities. No pleural effusion. No evidence of edema. Unchanged bones.  IMPRESSION: 1. Interval placement of left jugular approach central venous catheter with tip projected over the central aspect of the left innominate vein. 2. Persistently reduced lung volumes with grossly unchanged infrahilar opacities, likely atelectasis.   Electronically Signed   By: Sandi Mariscal M.D.   On: 06/24/2014 21:46   Dg  Chest Port 1v Same Day  06/26/2014   CLINICAL DATA:  Intubated patient.  EXAM: PORTABLE CHEST - 1 VIEW SAME DAY  COMPARISON:  06/25/2014.  FINDINGS: Mild persistent perihilar and medial lung base atelectasis. Lungs are otherwise clear.  Cardiac silhouette is normal in size.  Normal mediastinal contour.  Endotracheal tube is stable in well positioned.  New nasogastric tube passes below the diaphragm to curl within the stomach.  IMPRESSION: 1. New nasogastric tube is well positioned curling within the stomach. 2. No other change.  No acute findings in the lungs.   Electronically Signed   By: Lajean Manes M.D.   On: 06/26/2014 09:11   Dg Abd Portable 1v  06/25/2014   CLINICAL DATA:  53 year old male undergoing enteric tube placement.  EXAM: PORTABLE ABDOMEN - 1 VIEW  COMPARISON:  Portable chest radiograph 0309 hr the same day and earlier.  FINDINGS: Two portable AP views of the abdomen at 0959 hrs. Enteric tube projects over the proximal stomach, side hole at the level of the gastric fundus.  Increased dilated gas-filled small bowel loops, measuring up to 39 mm diameter. Similar volume of large bowel gas compared to recent CT Abdomen and Pelvis. Excreted IV contrast in the bladder. No definite pneumoperitoneum. No acute osseous abnormality identified.  IMPRESSION: 1. Enteric tube placed, side hole at the level of the gastric fundus. 2. New small bowel obstruction gas pattern.  No definite free air.   Electronically Signed   By: Lars Pinks M.D.   On: 06/25/2014 10:44    Anti-infectives: Anti-infectives   Start     Dose/Rate Route Frequency Ordered Stop   06/25/14 0345  cefTRIAXone (ROCEPHIN) 1 g in dextrose 5 % 50 mL IVPB     1 g 100 mL/hr over 30 Minutes Intravenous Every 24 hours 06/25/14 0344        Assessment/Plan: s/p Procedure(s): oversew of rectal varices (N/A); visceral arteriography and colonoscopy.  Asked by Dr. [name] yesterday about feasibility of eventual  TIPS and variceal  embolization/sclerosis once patient more stable.  Currently, based on INR 2.35, total bilirubin of 2.3 and Cr of 3.5, the patient's MELD score is 31.  This is prohibitively too high for TIPS currently with >50% mortality risk after TIPS.  Will continue to follow patient and labs, as he may become a candidate for TIPS if he improves and survives this critical illness.  Anja Neuzil T 06/26/2014

## 2014-06-26 NOTE — Progress Notes (Signed)
PULMONARY / CRITICAL CARE MEDICINE   Name: Edwin Love MRN: [id number] DOB: 06/04/1961    ADMISSION DATE: 06/24/2014  CONSULTATION DATE: 06/24/2014   REFERRING MD : Kaplan  PRIMARY SERVICE: PCCM   CHIEF COMPLAINT: Lower GI Hemorrhage   BRIEF PATIENT DESCRIPTION:  53 y.o. M with cirrhosis and esophageal varices brought to ED after being found lying in pool of blood by family. Underwent EGD 7/16 which was negative for UGIB; however, pt continued to have massive hematochezia. Bleeding scan positive for LGIB in sigmoid colon. PCCM consulted and pt admitted to ICU.   SIGNIFICANT EVENTS / STUDIES:  7/16 Abd CT >>> cirrhosis, multiple new hepatic lesions are noted, partial thrombosis of portal vein, portal HTN with multiple varices, periportal adenopathy cannot be excluded, umbilical hernia with herniation of small bowel, extensive small bowel wall edema. 7/16 EGD >>> varices present; however, no acute bleeding.  7/16 Bleeding Scan >>> positive for acute lower GI bleed with source likely within the sigmoid colon.  7/17 Continued brisk BRBPR, shock, altered MS 7/17 Intubated 7/17 Angiogram of IMA, SMA and left gastric artery -No acute arterial bleeding source identified 7/17 Colonscopy w/ active rectal bleeding/cardiac arrest w/ PEA/CPR  7/17 >To OR large rectal varices bleed>oversewn  7/18 Remains intubated. Worsening met acidosis. HCO3 infusion increased. Received 1 unit PRBCs, 1 unit plts and 2 units FFP    LINES / TUBES:  Left IJ TLC 7/16 >>  R radial arterial cath 7/16 >>  ETT 7/17 >>   CULTURES:  7/16 HIV Ab >>>NR  7/16 HCV Ab >>> POS  7/16 AFP >>> POS  ANTIBIOTICS:  Ceftriaxone (SBP px) 7/17 >>   INTERVAL EVENTS: Cardiac arrest w/ hypotension/bradycardia leading to PEA/Asystole w/ CPR yesterday during colonoscopy w/ large rectal bleeding 7/17  Went to OR last evening for rectal varices active bleed-large output , received several units of FFP/PRBC . Bleeding has  decreased  Pt responds, follows simple commands this am  Remains on pressor support     VITAL SIGNS: Temp:  [97.1 F (36.2 C)-99.1 F (37.3 C)] 99.1 F (37.3 C) (07/18 0437) Pulse Rate:  [54-127] 103 (07/18 0737) Resp:  [6-31] 30 (07/18 0737) BP: (34-178)/(14-89) 103/39 mmHg (07/18 0737) SpO2:  [91 %-100 %] 100 % (07/18 0737) Arterial Line BP: (47-193)/(25-91) 117/39 mmHg (07/18 0700) FiO2 (%):  [40 %-100 %] 40 % (07/18 0737) Weight:  [93.4 kg (205 lb 14.6 oz)] 93.4 kg (205 lb 14.6 oz) (07/18 0500) HEMODYNAMICS: CVP:  [3 mmHg-12 mmHg] 12 mmHg VENTILATOR SETTINGS: Vent Mode:  [-] PRVC FiO2 (%):  [40 %-100 %] 40 % Set Rate:  [14 bmp-30 bmp] 30 bmp Vt Set:  [650 mL] 650 mL PEEP:  [5 cmH20] 5 cmH20 Plateau Pressure:  [14 cmH20-26 cmH20] 20 cmH20 INTAKE / OUTPUT: Intake/Output     07/17 0701 - 07/18 0700 07/18 0701 - 07/19 0700   I.V. (mL/kg) 4417.2 (47.3)    Blood 2020    IV Piggyback 628    Total Intake(mL/kg) 7065.2 (75.6)    Urine (mL/kg/hr)     Blood 200 (0.1)    Total Output 200     Net +6865.2           PHYSICAL EXAMINATION: General: Ill-appearing, on vent  HEENT: NCAT, enucleated left eye  Cardiovascular: ST, 1+ flow murmur  Respiratory:  Bibasilar crackles  Abdomen: +BS, + easily reducible umbilical hernia, soft  Extremities: No obvious deformities  Skin: Pale, no wounds noted  Neuro: sedated      LABS:  CBC  Recent Labs Lab 06/25/14 1130 06/25/14 1658 06/25/14 2130 06/26/14 0235  WBC 16.9*  --  4.0 12.3*  HGB 7.4* 5.1* 7.8* 7.4*  HCT 20.8* 15.0* 22.3* 20.9*  PLT 108*  --  31* 51*   Coag's  Recent Labs Lab 06/24/14 1100 06/25/14 0300 06/25/14 1130 06/25/14 2130 06/26/14 0509  APTT 33 36  --  46*  --   INR 1.87* 2.33* 2.33* 2.35* 2.59*   BMET  Recent Labs Lab 06/25/14 1130 06/25/14 1658 06/25/14 2130 06/26/14 0509  NA 140 144 145 145  K 6.2* 4.2 4.4 4.8  CL 108  --  107 105  CO2 10*  --  11* 9*  BUN 25*  --  26* 30*   CREATININE 2.53*  --  2.89* 3.50*  GLUCOSE 138*  --  122* 105*   Electrolytes  Recent Labs Lab 06/25/14 0300 06/25/14 1130 06/25/14 2130 06/26/14 0509  CALCIUM 6.5* 6.4* 6.6* 6.5*  MG 1.6  --   --   --   PHOS 5.0*  --   --   --    Sepsis Markers  Recent Labs Lab 06/25/14 1130  LATICACIDVEN 6.9*   ABG  Recent Labs Lab 06/25/14 1158 06/25/14 1658 06/25/14 1930  PHART 7.290* 6.982* 7.314*  PCO2ART 25.7* 41.2 24.4*  PO2ART 164.0* 256.0* 214.0*   Liver Enzymes  Recent Labs Lab 06/24/14 1100 06/25/14 2130  AST 71* 2032*  ALT 20 372*  ALKPHOS 85 65  BILITOT 0.4 2.3*  ALBUMIN 1.4* 2.0*   Cardiac Enzymes  Recent Labs Lab 06/25/14 1130  TROPONINI <0.30   Glucose  Recent Labs Lab 06/24/14 2030 06/25/14 1919  GLUCAP 130* 115*    Imaging Nm Gi Blood Loss  06/24/2014   CLINICAL DATA:  Bloody stools since yesterday. Evaluate for GI bleed.  EXAM: NUCLEAR MEDICINE GASTROINTESTINAL BLEEDING SCAN  TECHNIQUE: Sequential abdominal images were obtained following intravenous administration of Tc-99m labeled red blood cells.  RADIOPHARMACEUTICALS:  25 mCi Tc-99m in-vitro labeled red cells.  COMPARISON:  CT the abdomen pelvis - 06/24/2014; 02/08/2014  FINDINGS: The examination is degraded secondary to patient motion artifact. There is a faint ill-defined area of persistent radiotracer activity within the right lower abdominal quadrant seen on the initial 60 min of planar imaging. There is a rapid worsening of this radiotracer seen on the provided 110 min anterior projection planar images with subsequent retrograde flow into the distal sigmoid, ascending and transverse colon.  Expected physiologic activity is seen within the hepatic parenchyma an intravascular blood pool of the abdomen and pelvis. Excreted free radiotracer is seen within the urinary bladder.  IMPRESSION: Examination is positive for acute lower GI bleed with source likely within the sigmoid colon.  Critical  Value/emergent results were called by telephone at the time of interpretation on 06/24/2014 at 8:06 pm to Dr. Jay Pyrtle, who verbally acknowledged these results.   Electronically Signed   By: John  Watts M.D.   On: 06/24/2014 20:22   Ct Abdomen Pelvis W Contrast  06/24/2014   CLINICAL DATA:  GI bleeding.  Pain.  EXAM: CT ABDOMEN AND PELVIS WITH CONTRAST  TECHNIQUE: Multidetector CT imaging of the abdomen and pelvis was performed using the standard protocol following bolus administration of intravenous contrast.  CONTRAST:  80mL OMNIPAQUE IOHEXOL 300 MG/ML  SOLN  COMPARISON:  CT 02/08/2014.  FINDINGS: The liver has an irregular contour suggesting cirrhosis. Multiple new hepatic low-density lesions are noted. The largest measures 2.6 cm, image number   18/series 2. Although these could represent regenerating nodules, metastatic disease or multifocal hepatocellular carcinoma cannot be excluded. There is a new enhancing 2.2 cm nodule in the dome of the liver with similar differential, image number 12/series 2. Spleen is normal. Perigastric and periportal serpiginous densities are noted most likely varices. Periportal adenopathy cannot be excluded. Partial thrombosis of the portal vein noted. Multiple retroperitoneal varices are present. Varicosities of the mesenteric venous system noted. Pelvic varicosities particularly prominent. Splenic vein patent. Hepatic veins patent. No biliary distention. Gallbladder wall edema noted, this may be from hypoproteinemia. Mild ascites present.  Adrenals normal. No focal renal abnormality. No hydronephrosis or obstructing ureteral stone. The bladder is unremarkable. Calcification within the prostate.  No significant adenopathy. Aorta normal caliber with atherosclerotic vascular changes. Celiac and mesenteric arteries patent.  Appendix normal. Stool noted throughout the colon. Umbilical hernia is noted with what appears to be herniated small bowel. Mild proximal and mid small bowel  distention is present. This could be secondary to partial obstruction. Diffuse small bowel wall edema noted. Although edema could be secondary to the hernia and partial obstruction, small bowel wall edema may also be secondary to hyperproteinemia and vascular congestion given the patient's partial portal vein thrombosis and varices. No pneumatosis intestinalis. No portal venous air. No free air.  Lung bases clear. Coronary artery disease. Mild cardiomegaly. Degenerative changes lumbar spine and both hips.  IMPRESSION: 1. Liver appears cirrhotic. Multiple new hepatic lesions are noted. Differential diagnosis includes regenerating nodules, metastatic disease, multifocal hepatocellular carcinoma. 2. Partial thrombosis of the portal vein. 3. Portal hypertension with multiple varices. 4. Periportal adenopathy cannot be excluded. 5. Umbilical hernia with herniation of small bowel. Partial small bowel obstruction cannot be excluded. 6. Extensive small bowel wall edema, although this could be related to partial small bowel obstruction, hyperproteinemia and vascular congestion could also result in bowel wall edema. No pneumatosis intestinalis. No portal venous air. No free air.   Electronically Signed   By: Marcello Moores  Register   On: 06/24/2014 13:03   Ir Angiogram Visceral Selective  06/25/2014   CLINICAL DATA:  Massive acute lower GI bleed with severe anemia and hypotension. Bleeding scan suggested bleeding from the distal colon.  EXAM: 1. ULTRASOUND GUIDANCE FOR VASCULAR ACCESS OF THE RIGHT COMMON FEMORAL ARTERY 2. SELECTIVE ARTERIOGRAPHY OF THE INFERIOR MESENTERIC ARTERY 3. SELECTIVE ARTERIOGRAPHY OF THE SUPERIOR MESENTERIC ARTERY 4. SELECTIVE ARTERIOGRAPHY OF THE LEFT GASTRIC ARTERY  MEDICATIONS: 200 mcg intra-arterial nitroglycerin  CONTRAST:  160m VISIPAQUE IODIXANOL 320 MG/ML IV SOLN  FLUOROSCOPY TIME:  15 min and 48 seconds.  PROCEDURE: The patient was intubated and unresponsive during the procedure. Emergent  consent was obtained from the patient's father for arteriography with possible embolization.  The right groin was prepped with Betadine and draped. Maximal Sterile Barrier Technique was utilized including caps, mask, sterile gowns, sterile gloves, sterile drape, hand hygiene and skin antiseptic.  Ultrasound was used to confirm patency of the right common femoral artery. Access of the artery was performed with a 21 gauge needle under direct ultrasound guidance. After securing access with a micropuncture set, a 5 French sheath was placed over a guidewire.  A 5 FPakistancobra catheter was introduced into the abdominal aorta. The inferior mesenteric artery was selectively catheterized. Selective arteriography was performed prior to, and following, administration of 100 mcg intra-arterial nitroglycerin directly into the IMA.  The catheter was then used to selectively catheterize the superior mesenteric artery. Selective arteriography was performed prior to, and following, administration of  100 mcg intra-arterial nitroglycerin.  The catheter was then used to selectively catheterize the left gastric artery. Selective arteriography was performed.  The catheter was removed. Hemostasis was obtained at the arteriotomy with the Cordis ExoSeal device.  FINDINGS: Initial inferior mesenteric arteriography shows severe vasoconstriction, limiting visualization of distal branch vessels. After administration of nitroglycerin, improved visualization of arteries was identified. There is no evidence of active arterial bleeding, vascular malformation or other vascular abnormality within the IMA territory.  SMA arteriography demonstrated similar severe vasoconstriction which did show some improvement after administration of nitroglycerin. Arteriography demonstrates no evidence of active arterial bleeding, vascular malformation or other vascular abnormality.  In attempting catheterization of the celiac axis, selective arteriography was  performed of a left gastric artery emanating from the abdominal aorta. 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 Injection at the time of SMA arteriography was likely distal to the hepatic and splenic arteries.  IMPRESSION: Negative visceral arteriography for arterial bleeding source in the distribution of the inferior mesenteric artery, superior mesenteric artery and left gastric artery. 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   Electronically Signed   By: Aletta Edouard M.D.   On: 06/25/2014 11:08   Ir Angiogram Visceral Selective  06/25/2014   CLINICAL DATA:  Massive acute lower GI bleed with severe anemia and hypotension. Bleeding scan suggested bleeding from the distal colon.  EXAM: 1. ULTRASOUND GUIDANCE FOR VASCULAR ACCESS OF THE RIGHT COMMON FEMORAL ARTERY 2. SELECTIVE ARTERIOGRAPHY OF THE INFERIOR MESENTERIC ARTERY 3. SELECTIVE ARTERIOGRAPHY OF THE SUPERIOR MESENTERIC ARTERY 4. SELECTIVE ARTERIOGRAPHY OF THE LEFT GASTRIC ARTERY  MEDICATIONS: 200 mcg intra-arterial nitroglycerin  CONTRAST:  146m VISIPAQUE IODIXANOL 320 MG/ML IV SOLN  FLUOROSCOPY TIME:  15 min and 48 seconds.  PROCEDURE: The patient was intubated and unresponsive during the procedure. Emergent consent was obtained from the patient's father for arteriography with possible embolization.  The right groin was prepped with Betadine and draped. Maximal Sterile Barrier Technique was utilized including caps, mask, sterile gowns, sterile gloves, sterile drape, hand hygiene and skin antiseptic.  Ultrasound was used to confirm patency of the right common femoral artery. Access of the artery was performed with a 21 gauge needle under direct ultrasound guidance. After securing access with a micropuncture set, a 5 French sheath was placed over a guidewire.  A 5 FPakistancobra  catheter was introduced into the abdominal aorta. The inferior mesenteric artery was selectively catheterized. Selective arteriography was performed prior to, and following, administration of 100 mcg intra-arterial nitroglycerin directly into the IMA.  The catheter was then used to selectively catheterize the superior mesenteric artery. Selective arteriography was performed prior to, and following, administration of 100 mcg intra-arterial nitroglycerin.  The catheter was then used to selectively catheterize the left gastric artery. Selective arteriography was performed.  The catheter was removed. Hemostasis was obtained at the arteriotomy with the Cordis ExoSeal device.  FINDINGS: Initial inferior mesenteric arteriography shows severe vasoconstriction, limiting visualization of distal branch vessels. After administration of nitroglycerin, improved visualization of arteries was identified. There is no evidence of active arterial bleeding, vascular malformation or other vascular abnormality within the IMA territory.  SMA arteriography demonstrated similar severe vasoconstriction which did show some improvement after administration of nitroglycerin. Arteriography demonstrates no evidence of active arterial bleeding, vascular malformation or other vascular abnormality.  In attempting catheterization of  the celiac axis, selective arteriography was performed of a left gastric artery emanating from the abdominal aorta. 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 Injection at the time of SMA arteriography was likely distal to the hepatic and splenic arteries.  IMPRESSION: Negative visceral arteriography for arterial bleeding source in the distribution of the inferior mesenteric artery, superior mesenteric artery and left gastric artery. Bleeding source may be secondary to prominent mesenteric varices identified by CT which  extend to abut the rectum.   Electronically Signed   By: Glenn  Yamagata M.D.   On: 06/25/2014 11:08   Ir Angiogram Visceral Selective  06/25/2014   CLINICAL DATA:  Massive acute lower GI bleed with severe anemia and hypotension. Bleeding scan suggested bleeding from the distal colon.  EXAM: 1. ULTRASOUND GUIDANCE FOR VASCULAR ACCESS OF THE RIGHT COMMON FEMORAL ARTERY 2. SELECTIVE ARTERIOGRAPHY OF THE INFERIOR MESENTERIC ARTERY 3. SELECTIVE ARTERIOGRAPHY OF THE SUPERIOR MESENTERIC ARTERY 4. SELECTIVE ARTERIOGRAPHY OF THE LEFT GASTRIC ARTERY  MEDICATIONS: 200 mcg intra-arterial nitroglycerin  CONTRAST:  100mL VISIPAQUE IODIXANOL 320 MG/ML IV SOLN  FLUOROSCOPY TIME:  15 min and 48 seconds.  PROCEDURE: The patient was intubated and unresponsive during the procedure. Emergent consent was obtained from the patient's father for arteriography with possible embolization.  The right groin was prepped with Betadine and draped. Maximal Sterile Barrier Technique was utilized including caps, mask, sterile gowns, sterile gloves, sterile drape, hand hygiene and skin antiseptic.  Ultrasound was used to confirm patency of the right common femoral artery. Access of the artery was performed with a 21 gauge needle under direct ultrasound guidance. After securing access with a micropuncture set, a 5 French sheath was placed over a guidewire.  A 5 French cobra catheter was introduced into the abdominal aorta. The inferior mesenteric artery was selectively catheterized. Selective arteriography was performed prior to, and following, administration of 100 mcg intra-arterial nitroglycerin directly into the IMA.  The catheter was then used to selectively catheterize the superior mesenteric artery. Selective arteriography was performed prior to, and following, administration of 100 mcg intra-arterial nitroglycerin.  The catheter was then used to selectively catheterize the left gastric artery. Selective arteriography was performed.  The  catheter was removed. Hemostasis was obtained at the arteriotomy with the Cordis ExoSeal device.  FINDINGS: Initial inferior mesenteric arteriography shows severe vasoconstriction, limiting visualization of distal branch vessels. After administration of nitroglycerin, improved visualization of arteries was identified. There is no evidence of active arterial bleeding, vascular malformation or other vascular abnormality within the IMA territory.  SMA arteriography demonstrated similar severe vasoconstriction which did show some improvement after administration of nitroglycerin. Arteriography demonstrates no evidence of active arterial bleeding, vascular malformation or other vascular abnormality.  In attempting catheterization of the celiac axis, selective arteriography was performed of a left gastric artery emanating from the abdominal aorta. 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 Injection at the time of SMA arteriography was likely distal to the hepatic and splenic arteries.  IMPRESSION: Negative visceral arteriography for arterial bleeding source in the distribution of the inferior mesenteric artery, superior mesenteric artery and left gastric artery. 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   Electronically Signed   By: Glenn  Yamagata M.D.   On: 06/25/2014   11:08   Ir US Guide Vasc Access Right  06/25/2014   CLINICAL DATA:  Massive acute lower GI bleed with severe anemia and hypotension. Bleeding scan suggested bleeding from the distal colon.  EXAM: 1. ULTRASOUND GUIDANCE FOR VASCULAR ACCESS OF THE RIGHT COMMON FEMORAL ARTERY 2. SELECTIVE ARTERIOGRAPHY OF THE INFERIOR MESENTERIC ARTERY 3. SELECTIVE ARTERIOGRAPHY OF THE SUPERIOR MESENTERIC ARTERY 4. SELECTIVE ARTERIOGRAPHY OF THE LEFT GASTRIC ARTERY  MEDICATIONS: 200 mcg intra-arterial  nitroglycerin  CONTRAST:  14m VISIPAQUE IODIXANOL 320 MG/ML IV SOLN  FLUOROSCOPY TIME:  15 min and 48 seconds.  PROCEDURE: The patient was intubated and unresponsive during the procedure. Emergent consent was obtained from the patient's father for arteriography with possible embolization.  The right groin was prepped with Betadine and draped. Maximal Sterile Barrier Technique was utilized including caps, mask, sterile gowns, sterile gloves, sterile drape, hand hygiene and skin antiseptic.  Ultrasound was used to confirm patency of the right common femoral artery. Access of the artery was performed with a 21 gauge needle under direct ultrasound guidance. After securing access with a micropuncture set, a 5 French sheath was placed over a guidewire.  A 5 FPakistancobra catheter was introduced into the abdominal aorta. The inferior mesenteric artery was selectively catheterized. Selective arteriography was performed prior to, and following, administration of 100 mcg intra-arterial nitroglycerin directly into the IMA.  The catheter was then used to selectively catheterize the superior mesenteric artery. Selective arteriography was performed prior to, and following, administration of 100 mcg intra-arterial nitroglycerin.  The catheter was then used to selectively catheterize the left gastric artery. Selective arteriography was performed.  The catheter was removed. Hemostasis was obtained at the arteriotomy with the Cordis ExoSeal device.  FINDINGS: Initial inferior mesenteric arteriography shows severe vasoconstriction, limiting visualization of distal branch vessels. After administration of nitroglycerin, improved visualization of arteries was identified. There is no evidence of active arterial bleeding, vascular malformation or other vascular abnormality within the IMA territory.  SMA arteriography demonstrated similar severe vasoconstriction which did show some improvement after administration of nitroglycerin.  Arteriography demonstrates no evidence of active arterial bleeding, vascular malformation or other vascular abnormality.  In attempting catheterization of the celiac axis, selective arteriography was performed of a left gastric artery emanating from the abdominal aorta. 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 Injection at the time of SMA arteriography was likely distal to the hepatic and splenic arteries.  IMPRESSION: Negative visceral arteriography for arterial bleeding source in the distribution of the inferior mesenteric artery, superior mesenteric artery and left gastric artery. 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   Electronically Signed   By: GAletta EdouardM.D.   On: 06/25/2014 11:08   Dg Chest Port 1 View  06/25/2014   CLINICAL DATA:  Check endotracheal tube  EXAM: PORTABLE CHEST - 1 VIEW  COMPARISON:  Chest x-ray from yesterday  FINDINGS: New endotracheal tube, tip at the clavicular heads. Unchanged positioning of a left IJ catheter.  Lower lung volumes with interstitial crowding and possible left lower lobe segmental atelectasis. There is no evidence of effusion or pneumothorax.  IMPRESSION: 1. New endotracheal tube is in good position. 2. Low lung volumes and basilar atelectasis.   Electronically Signed   By: JJorje GuildM.D.   On: 06/25/2014 03:44   Dg Chest Port 1 View  06/24/2014  CLINICAL DATA:  Post central line placement  EXAM: PORTABLE CHEST - 1 VIEW  COMPARISON:  06/24/2014; 01/21/2014  FINDINGS: Grossly unchanged enlarged cardiac silhouette and mediastinal contours with atherosclerotic plaque within the thoracic aorta. Interval placement of left jugular approach intravenous catheter with tip projected over the central aspect of the left innominate vein. No pneumothorax. Grossly unchanged bilateral infrahilar  heterogeneous opacities. No new focal airspace opacities. No pleural effusion. No evidence of edema. Unchanged bones.  IMPRESSION: 1. Interval placement of left jugular approach central venous catheter with tip projected over the central aspect of the left innominate vein. 2. Persistently reduced lung volumes with grossly unchanged infrahilar opacities, likely atelectasis.   Electronically Signed   By: John  Watts M.D.   On: 06/24/2014 21:46   Dg Chest Portable 1 View  06/24/2014   CLINICAL DATA:  Shortness of breath and hypotension  EXAM: PORTABLE CHEST - 1 VIEW  COMPARISON:  PA and lateral chest x-ray of January 21, 2014  FINDINGS: The lungs are hypoinflated but clear. The cardiac silhouette is mildly enlarged. The pulmonary vascularity is normal. There is no pleural effusion. The bony thorax is unremarkable.  IMPRESSION: The study is limited due to hypoinflation. There is new enlargement of the cardiac silhouette without evidence of pulmonary edema.   Electronically Signed   By:   Jordan   On: 06/24/2014 11:33   Dg Abd Portable 1v  06/25/2014   CLINICAL DATA:  53-year-old male undergoing enteric tube placement.  EXAM: PORTABLE ABDOMEN - 1 VIEW  COMPARISON:  Portable chest radiograph 0309 hr the same day and earlier.  FINDINGS: Two portable AP views of the abdomen at 0959 hrs. Enteric tube projects over the proximal stomach, side hole at the level of the gastric fundus.  Increased dilated gas-filled small bowel loops, measuring up to 39 mm diameter. Similar volume of large bowel gas compared to recent CT Abdomen and Pelvis. Excreted IV contrast in the bladder. No definite pneumoperitoneum. No acute osseous abnormality identified.  IMPRESSION: 1. Enteric tube placed, side hole at the level of the gastric fundus. 2. New small bowel obstruction gas pattern.  No definite free air.   Electronically Signed   By: Lee  Hall M.D.   On: 06/25/2014 10:44     ASSESSMENT / PLAN:  PULMONARY A:  Acute  respiratory failure  At risk pulmonary edema / TRALI  P:  PRVC ventilation Check cxr and abg  Add Albuterol  Neb As needed    CARDIOVASCULAR A:  Hemorrhagic shock Chronic HTN  Diastolic CHF  Cardiac Arrest 7/17 in setting active hemorrhage   P:  Cont Pressor support Levo and Vaso  w/ MAP goal >65 -wean as allowed  Cont stress dose steroids   GASTROINTESTINAL A:  Acute lower GI bleed - secondary to rectal varices s/p OR w/ sutured varices d/t large output bleed (7/17)  Cirrhosis, alcoholic  Hepatitis C Ab positive - ? Old vs new diagnosis Multiple new hepatic lesions noted on CT Abdomen - unclear etiology, DDx includes regenerating nodules, metastatic disease, multifocal hepatocellular carcinoma.  In addition, note AFP tumor marker is positive > consistent with hepatocellular CA P:  Cont  octreotide   PPI Future issue will be liver bx of lesions, ? hepatocellular CA   RENAL A:  AoCKD, Stage 3, baseline cr 0.9-1.4.  Not presently a candidate for HD Severe AG metabolic acidosis Hypocalcemia P:  Increase Bicarb gtt Strict I/O , place foley  Replace electrolytes as indicated    HEMATOLOGIC   A:  Acute blood loss anemia  Thrombocytopenia - 94 on admission  Coagulopathy - consumptive and hepatic P:  Goal hemoglobin > 8  Goal INR <1.5   Goal plts > 100k Transfuse products as needed to achieve above goals Cont Vitamin K  INFECTIOUS A:  SBP prophylaxis indicated in setting of variceal bleed  Hep C positive - new diagnosis HIV NR  P:  Micro and abx as above Will need ID follow-up outpatient for possible HCV treatment if this is new for him  ENDOCRINE A:  At risk hypoglycemia P:   Monitor glucose on BMET  NEUROLOGIC A:  Alcohol abuse Chronic alcoholism  At risk alcohol withdrawal P:  Versed gtt/ fent gtt, goal RASS -1 Thiamine and folate Ativan prn   GLOBAL - Guarded prognosis    Tammy Parrett NP-C  [facility]  [phone]    PCCM ATTENDING: I have interviewed and examined the patient and reviewed the database. I have formulated the assessment and plan as reflected in the note above with amendments made by me. 40 mins of direct critical care time provided   , MD;  PCCM service; Mobile [phone]  06/26/2014, 7:41 AM     

## 2014-06-26 NOTE — Progress Notes (Signed)
Critical Hgb 6.7 called to Dr. Jasper Loser physician orders placed in [place], [place]

## 2014-06-26 NOTE — Op Note (Signed)
NAMEBRAYLYNN, GHAN NO.:  [id number]  MEDICAL RECORD NO.:  [id number]  LOCATION:  2M01C                        FACILITY:  [facility]  PHYSICIAN:  Coralie Keens, M.D. DATE OF BIRTH:  03-Oct-1961  DATE OF PROCEDURE: DATE OF DISCHARGE:                              OPERATIVE REPORT   PREOPERATIVE DIAGNOSIS:  Bleeding rectal varices.  POSTOPERATIVE DIAGNOSIS:  Bleeding rectal varices.  PROCEDURE:  Oversew of bleeding rectal varices.  SURGEON:  Coralie Keens, M.D.  ANESTHESIA:  General.  ESTIMATED BLOOD LOSS:  About 10 mL.  INDICATIONS:  This is a 53 year old gentleman with child C cirrhosis, who presented with a GI bleed.  He has been on a ventilator with a hemoglobin as low as 4.  He has just undergone endoscopy and found to have an actively bleeding rectal varices.  The rest of his colon appeared free of bleeding.  A clip was applied to this, but he still was actively bleeding and even coded.  He was resuscitated and brought emergently to the operating room for surgery.  PROCEDURE IN DETAIL:  The patient was brought emergently to the operating room and intubated.  He was placed supine on the operating room table, and then placed in lithotomy position.  I deflated the rectal balloon that had been placed, immediately approximately 1000 mL of blood came out of the rectum.  I inserted a retractor in the anal canal.  He was vigorously bleeding from a large varices.  It was basically almost shooting across the room.  I was able to grab the varices with 2 Kelly clamps.  I then placed multiple figure-of-eight 2-0 Vicryl sutures around the varices.  At this point, it finally appeared to stop the bleeding.  I then examined it further and again no further bleeding was identified.  I then placed several pieces of Gelfoam into the anal canal.  The patient was then taken in a guarded condition, still intubated back to the intensive care unit.     Coralie Keens, M.D.     DB/MEDQ  D:  06/25/2014  T:  06/26/2014  Job:  [id number]

## 2014-06-26 NOTE — Progress Notes (Signed)
Progress Note   Subjective  *Intubated**   Objective  Vital signs in last 24 hours: Temp:  [97.1 F (36.2 C)-99.1 F (37.3 C)] 98.7 F (37.1 C) (07/18 0750) Pulse Rate:  [54-127] 101 (07/18 0845) Resp:  [6-31] 25 (07/18 0845) BP: (34-178)/(14-89) 103/72 mmHg (07/18 0817) SpO2:  [91 %-100 %] 100 % (07/18 0845) Arterial Line BP: (47-167)/(25-81) 114/28 mmHg (07/18 0845) FiO2 (%):  [40 %-100 %] 40 % (07/18 0816) Weight:  [205 lb 14.6 oz (93.4 kg)] 205 lb 14.6 oz (93.4 kg) (07/18 0500) Last BM Date: 06/26/14  Abdomen:  Soft, nontender and nondistended. Normal bowel sounds, without guarding, and without rebound.     Intake/Output from previous day: 07/17 0701 - 07/18 0700 In: 7065.2 [I.V.:4417.2; Blood:2020; IV Piggyback:628] Out: 200 [Blood:200] Intake/Output this shift: Total I/O In: 164.9 [I.V.:164.9] Out: 150 [Urine:150]  Lab Results:  Recent Labs  06/25/14 2130 06/26/14 0235 06/26/14 0815  WBC 4.0 12.3* 14.0*  HGB 7.8* 7.4* 7.3*  HCT 22.3* 20.9* 20.7*  PLT 31* 51* 62*   BMET  Recent Labs  06/25/14 1130 06/25/14 1658 06/25/14 2130 06/26/14 0509  NA 140 144 145 145  K 6.2* 4.2 4.4 4.8  CL 108  --  107 105  CO2 10*  --  11* 9*  GLUCOSE 138*  --  122* 105*  BUN 25*  --  26* 30*  CREATININE 2.53*  --  2.89* 3.50*  CALCIUM 6.4*  --  6.6* 6.5*   LFT  Recent Labs  06/25/14 2130  PROT 3.7*  ALBUMIN 2.0*  AST 2032*  ALT 372*  ALKPHOS 65  BILITOT 2.3*   PT/INR  Recent Labs  06/25/14 2130 06/26/14 0509  LABPROT 25.7* 27.8*  INR 2.35* 2.59*   Hepatitis Panel  Recent Labs  06/24/14 1340 06/25/14 2130  HEPBSAG NEGATIVE  --   HCVAB Reactive* Reactive*    Studies/Results: Nm Gi Blood Loss  06/24/2014   CLINICAL DATA:  Bloody stools since yesterday. Evaluate for GI bleed.  EXAM: NUCLEAR MEDICINE GASTROINTESTINAL BLEEDING SCAN  TECHNIQUE: Sequential abdominal images were obtained following intravenous administration of Tc-52m labeled  red blood cells.  RADIOPHARMACEUTICALS:  25 mCi Tc-58m in-vitro labeled red cells.  COMPARISON:  CT the abdomen pelvis - 06/24/2014; 02/08/2014  FINDINGS: The examination is degraded secondary to patient motion artifact. There is a faint ill-defined area of persistent radiotracer activity within the right lower abdominal quadrant seen on the initial 60 min of planar imaging. There is a rapid worsening of this radiotracer seen on the provided 110 min anterior projection planar images with subsequent retrograde flow into the distal sigmoid, ascending and transverse colon.  Expected physiologic activity is seen within the hepatic parenchyma an intravascular blood pool of the abdomen and pelvis. Excreted free radiotracer is seen within the urinary bladder.  IMPRESSION: Examination is positive for acute lower GI bleed with source likely within the sigmoid colon.  Critical Value/emergent results were called by telephone at the time of interpretation on 06/24/2014 at 8:06 pm to Dr. Zenovia Jarred, who verbally acknowledged these results.   Electronically Signed   By: Sandi Mariscal M.D.   On: 06/24/2014 20:22   Ct Abdomen Pelvis W Contrast  06/24/2014   CLINICAL DATA:  GI bleeding.  Pain.  EXAM: CT ABDOMEN AND PELVIS WITH CONTRAST  TECHNIQUE: Multidetector CT imaging of the abdomen and pelvis was performed using the standard protocol following bolus administration of intravenous contrast.  CONTRAST:  44mL OMNIPAQUE IOHEXOL  300 MG/ML  SOLN  COMPARISON:  CT 02/08/2014.  FINDINGS: The liver has an irregular contour suggesting cirrhosis. Multiple new hepatic low-density lesions are noted. The largest measures 2.6 cm, image number 18/series 2. Although these could represent regenerating nodules, metastatic disease or multifocal hepatocellular carcinoma cannot be excluded. There is a new enhancing 2.2 cm nodule in the dome of the liver with similar differential, image number 12/series 2. Spleen is normal. Perigastric and periportal  serpiginous densities are noted most likely varices. Periportal adenopathy cannot be excluded. Partial thrombosis of the portal vein noted. Multiple retroperitoneal varices are present. Varicosities of the mesenteric venous system noted. Pelvic varicosities particularly prominent. Splenic vein patent. Hepatic veins patent. No biliary distention. Gallbladder wall edema noted, this may be from hypoproteinemia. Mild ascites present.  Adrenals normal. No focal renal abnormality. No hydronephrosis or obstructing ureteral stone. The bladder is unremarkable. Calcification within the prostate.  No significant adenopathy. Aorta normal caliber with atherosclerotic vascular changes. Celiac and mesenteric arteries patent.  Appendix normal. Stool noted throughout the colon. Umbilical hernia is noted with what appears to be herniated small bowel. Mild proximal and mid small bowel distention is present. This could be secondary to partial obstruction. Diffuse small bowel wall edema noted. Although edema could be secondary to the hernia and partial obstruction, small bowel wall edema may also be secondary to hyperproteinemia and vascular congestion given the patient's partial portal vein thrombosis and varices. No pneumatosis intestinalis. No portal venous air. No free air.  Lung bases clear. Coronary artery disease. Mild cardiomegaly. Degenerative changes lumbar spine and both hips.  IMPRESSION: 1. Liver appears cirrhotic. Multiple new hepatic lesions are noted. Differential diagnosis includes regenerating nodules, metastatic disease, multifocal hepatocellular carcinoma. 2. Partial thrombosis of the portal vein. 3. Portal hypertension with multiple varices. 4. Periportal adenopathy cannot be excluded. 5. Umbilical hernia with herniation of small bowel. Partial small bowel obstruction cannot be excluded. 6. Extensive small bowel wall edema, although this could be related to partial small bowel obstruction, hyperproteinemia and  vascular congestion could also result in bowel wall edema. No pneumatosis intestinalis. No portal venous air. No free air.   Electronically Signed   By: Marcello Moores  Register   On: 06/24/2014 13:03   Ir Angiogram Visceral Selective  06/25/2014   CLINICAL DATA:  Massive acute lower GI bleed with severe anemia and hypotension. Bleeding scan suggested bleeding from the distal colon.  EXAM: 1. ULTRASOUND GUIDANCE FOR VASCULAR ACCESS OF THE RIGHT COMMON FEMORAL ARTERY 2. SELECTIVE ARTERIOGRAPHY OF THE INFERIOR MESENTERIC ARTERY 3. SELECTIVE ARTERIOGRAPHY OF THE SUPERIOR MESENTERIC ARTERY 4. SELECTIVE ARTERIOGRAPHY OF THE LEFT GASTRIC ARTERY  MEDICATIONS: 200 mcg intra-arterial nitroglycerin  CONTRAST:  157mL VISIPAQUE IODIXANOL 320 MG/ML IV SOLN  FLUOROSCOPY TIME:  15 min and 48 seconds.  PROCEDURE: The patient was intubated and unresponsive during the procedure. Emergent consent was obtained from the patient's father for arteriography with possible embolization.  The right groin was prepped with Betadine and draped. Maximal Sterile Barrier Technique was utilized including caps, mask, sterile gowns, sterile gloves, sterile drape, hand hygiene and skin antiseptic.  Ultrasound was used to confirm patency of the right common femoral artery. Access of the artery was performed with a 21 gauge needle under direct ultrasound guidance. After securing access with a micropuncture set, a 5 French sheath was placed over a guidewire.  A 5 [place] cobra catheter was introduced into the abdominal aorta. The inferior mesenteric artery was selectively catheterized. Selective arteriography was performed prior to, and following,  administration of 100 mcg intra-arterial nitroglycerin directly into the IMA.  The catheter was then used to selectively catheterize the superior mesenteric artery. Selective arteriography was performed prior to, and following, administration of 100 mcg intra-arterial nitroglycerin.  The catheter was then used to  selectively catheterize the left gastric artery. Selective arteriography was performed.  The catheter was removed. Hemostasis was obtained at the arteriotomy with the Cordis ExoSeal device.  FINDINGS: Initial inferior mesenteric arteriography shows severe vasoconstriction, limiting visualization of distal branch vessels. After administration of nitroglycerin, improved visualization of arteries was identified. There is no evidence of active arterial bleeding, vascular malformation or other vascular abnormality within the IMA territory.  SMA arteriography demonstrated similar severe vasoconstriction which did show some improvement after administration of nitroglycerin. Arteriography demonstrates no evidence of active arterial bleeding, vascular malformation or other vascular abnormality.  In attempting catheterization of the celiac axis, selective arteriography was performed of a left gastric artery emanating from the abdominal aorta. 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 Injection at the time of SMA arteriography was likely distal to the hepatic and splenic arteries.  IMPRESSION: Negative visceral arteriography for arterial bleeding source in the distribution of the inferior mesenteric artery, superior mesenteric artery and left gastric artery. 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   Electronically Signed   By: Aletta Edouard M.D.   On: 06/25/2014 11:08   Ir Angiogram Visceral Selective  06/25/2014   CLINICAL DATA:  Massive acute lower GI bleed with severe anemia and hypotension. Bleeding scan suggested bleeding from the distal colon.  EXAM: 1. ULTRASOUND GUIDANCE FOR VASCULAR ACCESS OF THE RIGHT COMMON FEMORAL ARTERY 2. SELECTIVE ARTERIOGRAPHY OF THE INFERIOR MESENTERIC ARTERY 3. SELECTIVE ARTERIOGRAPHY OF THE SUPERIOR MESENTERIC  ARTERY 4. SELECTIVE ARTERIOGRAPHY OF THE LEFT GASTRIC ARTERY  MEDICATIONS: 200 mcg intra-arterial nitroglycerin  CONTRAST:  165mL VISIPAQUE IODIXANOL 320 MG/ML IV SOLN  FLUOROSCOPY TIME:  15 min and 48 seconds.  PROCEDURE: The patient was intubated and unresponsive during the procedure. Emergent consent was obtained from the patient's father for arteriography with possible embolization.  The right groin was prepped with Betadine and draped. Maximal Sterile Barrier Technique was utilized including caps, mask, sterile gowns, sterile gloves, sterile drape, hand hygiene and skin antiseptic.  Ultrasound was used to confirm patency of the right common femoral artery. Access of the artery was performed with a 21 gauge needle under direct ultrasound guidance. After securing access with a micropuncture set, a 5 French sheath was placed over a guidewire.  A 5 [place] cobra catheter was introduced into the abdominal aorta. The inferior mesenteric artery was selectively catheterized. Selective arteriography was performed prior to, and following, administration of 100 mcg intra-arterial nitroglycerin directly into the IMA.  The catheter was then used to selectively catheterize the superior mesenteric artery. Selective arteriography was performed prior to, and following, administration of 100 mcg intra-arterial nitroglycerin.  The catheter was then used to selectively catheterize the left gastric artery. Selective arteriography was performed.  The catheter was removed. Hemostasis was obtained at the arteriotomy with the Cordis ExoSeal device.  FINDINGS: Initial inferior mesenteric arteriography shows severe vasoconstriction, limiting visualization of distal branch vessels. After administration of nitroglycerin, improved visualization of arteries was identified. There is no evidence of active arterial bleeding, vascular malformation or other vascular abnormality within the IMA territory.  SMA  arteriography demonstrated similar  severe vasoconstriction which did show some improvement after administration of nitroglycerin. Arteriography demonstrates no evidence of active arterial bleeding, vascular malformation or other vascular abnormality.  In attempting catheterization of the celiac axis, selective arteriography was performed of a left gastric artery emanating from the abdominal aorta. 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 Injection at the time of SMA arteriography was likely distal to the hepatic and splenic arteries.  IMPRESSION: Negative visceral arteriography for arterial bleeding source in the distribution of the inferior mesenteric artery, superior mesenteric artery and left gastric artery. 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   Electronically Signed   By: Aletta Edouard M.D.   On: 06/25/2014 11:08   Ir Angiogram Visceral Selective  06/25/2014   CLINICAL DATA:  Massive acute lower GI bleed with severe anemia and hypotension. Bleeding scan suggested bleeding from the distal colon.  EXAM: 1. ULTRASOUND GUIDANCE FOR VASCULAR ACCESS OF THE RIGHT COMMON FEMORAL ARTERY 2. SELECTIVE ARTERIOGRAPHY OF THE INFERIOR MESENTERIC ARTERY 3. SELECTIVE ARTERIOGRAPHY OF THE SUPERIOR MESENTERIC ARTERY 4. SELECTIVE ARTERIOGRAPHY OF THE LEFT GASTRIC ARTERY  MEDICATIONS: 200 mcg intra-arterial nitroglycerin  CONTRAST:  127mL VISIPAQUE IODIXANOL 320 MG/ML IV SOLN  FLUOROSCOPY TIME:  15 min and 48 seconds.  PROCEDURE: The patient was intubated and unresponsive during the procedure. Emergent consent was obtained from the patient's father for arteriography with possible embolization.  The right groin was prepped with Betadine and draped. Maximal Sterile Barrier Technique was utilized including caps, mask, sterile gowns, sterile gloves, sterile drape, hand hygiene and skin  antiseptic.  Ultrasound was used to confirm patency of the right common femoral artery. Access of the artery was performed with a 21 gauge needle under direct ultrasound guidance. After securing access with a micropuncture set, a 5 French sheath was placed over a guidewire.  A 5 [place] cobra catheter was introduced into the abdominal aorta. The inferior mesenteric artery was selectively catheterized. Selective arteriography was performed prior to, and following, administration of 100 mcg intra-arterial nitroglycerin directly into the IMA.  The catheter was then used to selectively catheterize the superior mesenteric artery. Selective arteriography was performed prior to, and following, administration of 100 mcg intra-arterial nitroglycerin.  The catheter was then used to selectively catheterize the left gastric artery. Selective arteriography was performed.  The catheter was removed. Hemostasis was obtained at the arteriotomy with the Cordis ExoSeal device.  FINDINGS: Initial inferior mesenteric arteriography shows severe vasoconstriction, limiting visualization of distal branch vessels. After administration of nitroglycerin, improved visualization of arteries was identified. There is no evidence of active arterial bleeding, vascular malformation or other vascular abnormality within the IMA territory.  SMA arteriography demonstrated similar severe vasoconstriction which did show some improvement after administration of nitroglycerin. Arteriography demonstrates no evidence of active arterial bleeding, vascular malformation or other vascular abnormality.  In attempting catheterization of the celiac axis, selective arteriography was performed of a left gastric artery emanating from the abdominal aorta. 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 Injection at the time of SMA arteriography was likely distal to the hepatic and  splenic arteries.  IMPRESSION: Negative visceral arteriography for arterial bleeding source in the distribution of the inferior mesenteric artery, superior mesenteric artery and left gastric  artery. 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   Electronically Signed   By: Aletta Edouard M.D.   On: 06/25/2014 11:08   Ir US Guide Vasc Access Right  06/25/2014   CLINICAL DATA:  Massive acute lower GI bleed with severe anemia and hypotension. Bleeding scan suggested bleeding from the distal colon.  EXAM: 1. ULTRASOUND GUIDANCE FOR VASCULAR ACCESS OF THE RIGHT COMMON FEMORAL ARTERY 2. SELECTIVE ARTERIOGRAPHY OF THE INFERIOR MESENTERIC ARTERY 3. SELECTIVE ARTERIOGRAPHY OF THE SUPERIOR MESENTERIC ARTERY 4. SELECTIVE ARTERIOGRAPHY OF THE LEFT GASTRIC ARTERY  MEDICATIONS: 200 mcg intra-arterial nitroglycerin  CONTRAST:  131mL VISIPAQUE IODIXANOL 320 MG/ML IV SOLN  FLUOROSCOPY TIME:  15 min and 48 seconds.  PROCEDURE: The patient was intubated and unresponsive during the procedure. Emergent consent was obtained from the patient's father for arteriography with possible embolization.  The right groin was prepped with Betadine and draped. Maximal Sterile Barrier Technique was utilized including caps, mask, sterile gowns, sterile gloves, sterile drape, hand hygiene and skin antiseptic.  Ultrasound was used to confirm patency of the right common femoral artery. Access of the artery was performed with a 21 gauge needle under direct ultrasound guidance. After securing access with a micropuncture set, a 5 French sheath was placed over a guidewire.  A 5 [place] cobra catheter was introduced into the abdominal aorta. The inferior mesenteric artery was selectively catheterized. Selective arteriography was performed prior to, and following, administration of 100 mcg intra-arterial nitroglycerin directly into the IMA.  The catheter was then used to selectively  catheterize the superior mesenteric artery. Selective arteriography was performed prior to, and following, administration of 100 mcg intra-arterial nitroglycerin.  The catheter was then used to selectively catheterize the left gastric artery. Selective arteriography was performed.  The catheter was removed. Hemostasis was obtained at the arteriotomy with the Cordis ExoSeal device.  FINDINGS: Initial inferior mesenteric arteriography shows severe vasoconstriction, limiting visualization of distal branch vessels. After administration of nitroglycerin, improved visualization of arteries was identified. There is no evidence of active arterial bleeding, vascular malformation or other vascular abnormality within the IMA territory.  SMA arteriography demonstrated similar severe vasoconstriction which did show some improvement after administration of nitroglycerin. Arteriography demonstrates no evidence of active arterial bleeding, vascular malformation or other vascular abnormality.  In attempting catheterization of the celiac axis, selective arteriography was performed of a left gastric artery emanating from the abdominal aorta. 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 Injection at the time of SMA arteriography was likely distal to the hepatic and splenic arteries.  IMPRESSION: Negative visceral arteriography for arterial bleeding source in the distribution of the inferior mesenteric artery, superior mesenteric artery and left gastric artery. 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   Electronically Signed   By: Aletta Edouard M.D.   On: 06/25/2014 11:08   Dg Chest Port 1 View  06/25/2014   CLINICAL DATA:  Check endotracheal tube  EXAM: PORTABLE CHEST - 1 VIEW  COMPARISON:  Chest x-ray from yesterday  FINDINGS: New endotracheal tube, tip at the  clavicular heads. Unchanged positioning of a left IJ catheter.  Lower lung volumes with interstitial crowding and possible left lower lobe segmental atelectasis. There is no evidence of effusion or pneumothorax.  IMPRESSION: 1. New endotracheal tube is in  good position. 2. Low lung volumes and basilar atelectasis.   Electronically Signed   By: Jorje Guild M.D.   On: 06/25/2014 03:44   Dg Chest Port 1 View  06/24/2014   CLINICAL DATA:  Post central line placement  EXAM: PORTABLE CHEST - 1 VIEW  COMPARISON:  06/24/2014; 01/21/2014  FINDINGS: Grossly unchanged enlarged cardiac silhouette and mediastinal contours with atherosclerotic plaque within the thoracic aorta. Interval placement of left jugular approach intravenous catheter with tip projected over the central aspect of the left innominate vein. No pneumothorax. Grossly unchanged bilateral infrahilar heterogeneous opacities. No new focal airspace opacities. No pleural effusion. No evidence of edema. Unchanged bones.  IMPRESSION: 1. Interval placement of left jugular approach central venous catheter with tip projected over the central aspect of the left innominate vein. 2. Persistently reduced lung volumes with grossly unchanged infrahilar opacities, likely atelectasis.   Electronically Signed   By: Sandi Mariscal M.D.   On: 06/24/2014 21:46   Dg Chest Portable 1 View  06/24/2014   CLINICAL DATA:  Shortness of breath and hypotension  EXAM: PORTABLE CHEST - 1 VIEW  COMPARISON:  PA and lateral chest x-ray of January 21, 2014  FINDINGS: The lungs are hypoinflated but clear. The cardiac silhouette is mildly enlarged. The pulmonary vascularity is normal. There is no pleural effusion. The bony thorax is unremarkable.  IMPRESSION: The study is limited due to hypoinflation. There is new enlargement of the cardiac silhouette without evidence of pulmonary edema.   Electronically Signed   By: David  Martinique   On: 06/24/2014 11:33   Dg Chest Port 1v Same  Day  06/26/2014   CLINICAL DATA:  Intubated patient.  EXAM: PORTABLE CHEST - 1 VIEW SAME DAY  COMPARISON:  06/25/2014.  FINDINGS: Mild persistent perihilar and medial lung base atelectasis. Lungs are otherwise clear.  Cardiac silhouette is normal in size.  Normal mediastinal contour.  Endotracheal tube is stable in well positioned.  New nasogastric tube passes below the diaphragm to curl within the stomach.  IMPRESSION: 1. New nasogastric tube is well positioned curling within the stomach. 2. No other change.  No acute findings in the lungs.   Electronically Signed   By: Lajean Manes M.D.   On: 06/26/2014 09:11   Dg Abd Portable 1v  06/25/2014   CLINICAL DATA:  53 year old male undergoing enteric tube placement.  EXAM: PORTABLE ABDOMEN - 1 VIEW  COMPARISON:  Portable chest radiograph 0309 hr the same day and earlier.  FINDINGS: Two portable AP views of the abdomen at 0959 hrs. Enteric tube projects over the proximal stomach, side hole at the level of the gastric fundus.  Increased dilated gas-filled small bowel loops, measuring up to 39 mm diameter. Similar volume of large bowel gas compared to recent CT Abdomen and Pelvis. Excreted IV contrast in the bladder. No definite pneumoperitoneum. No acute osseous abnormality identified.  IMPRESSION: 1. Enteric tube placed, side hole at the level of the gastric fundus. 2. New small bowel obstruction gas pattern.  No definite free air.   Electronically Signed   By: Lars Pinks M.D.   On: 06/25/2014 10:44      Assessment & Plan  *1.  Lower GI bleeding secondary to rectal varices-status post suture ligation.  No further active bleeding since procedure.  Should he rebleed would immediately insert a barium enema balloon catheter and inflate.  In the long run would consider for TIPS placement and embolization of rectal veins.  Mesocaval shunt is also a  consideration although patient carries a very high mortality. For recurrent bleeding we could insert a covered colonic  stent that could compress varices but stent migration may occur.  This would be a temporizing measure as well. 2.  cirrhosis.  Coagulopathy is probably reflective of this.*Recommend continuing broad-spectrum antibiotics and administering vitamin K. * Active Problems:   Acute posthemorrhagic anemia   Blood in stool   Alcoholic cirrhosis   Esophageal varices without bleeding   GI bleed   Lower GI bleed   Rectal varices   Hypotension, unspecified     LOS: 2 days   Erskine Emery  06/26/2014, 10:47 AM

## 2014-06-26 NOTE — Progress Notes (Signed)
1 Day Post-Op  Subjective: Pt with some rectal blood this AM.  Spontaneous resolution  Objective: Vital signs in last 24 hours: Temp:  [97.1 F (36.2 C)-99.1 F (37.3 C)] 98.7 F (37.1 C) (07/18 0750) Pulse Rate:  [54-127] 101 (07/18 0845) Resp:  [6-31] 25 (07/18 0845) BP: (34-178)/(14-89) 103/72 mmHg (07/18 0817) SpO2:  [91 %-100 %] 100 % (07/18 0845) Arterial Line BP: (47-167)/(25-81) 114/28 mmHg (07/18 0845) FiO2 (%):  [40 %-100 %] 40 % (07/18 0816) Weight:  [205 lb 14.6 oz (93.4 kg)] 205 lb 14.6 oz (93.4 kg) (07/18 0500) Last BM Date: 06/26/14  Intake/Output from previous day: 07/17 0701 - 07/18 0700 In: 7065.2 [I.V.:4417.2; Blood:2020; IV Piggyback:628] Out: 200 [Blood:200] Intake/Output this shift: Total I/O In: 164.9 [I.V.:164.9] Out: 150 [Urine:150]  General appearance: sedated GI: soft, non-tender; bowel sounds normal; no masses,  no organomegaly  Lab Results:   Recent Labs  06/26/14 0235 06/26/14 0815  WBC 12.3* 14.0*  HGB 7.4* 7.3*  HCT 20.9* 20.7*  PLT 51* 62*   BMET  Recent Labs  06/25/14 2130 06/26/14 0509  NA 145 145  K 4.4 4.8  CL 107 105  CO2 11* 9*  GLUCOSE 122* 105*  BUN 26* 30*  CREATININE 2.89* 3.50*  CALCIUM 6.6* 6.5*   PT/INR  Recent Labs  06/25/14 2130 06/26/14 0509  LABPROT 25.7* 27.8*  INR 2.35* 2.59*   ABG  Recent Labs  06/25/14 1930 06/26/14 0857  PHART 7.314* 7.359  HCO3 12.5* 11.4*    Studies/Results: Nm Gi Blood Loss  06/24/2014   CLINICAL DATA:  Bloody stools since yesterday. Evaluate for GI bleed.  EXAM: NUCLEAR MEDICINE GASTROINTESTINAL BLEEDING SCAN  TECHNIQUE: Sequential abdominal images were obtained following intravenous administration of Tc-79m labeled red blood cells.  RADIOPHARMACEUTICALS:  25 mCi Tc-20m in-vitro labeled red cells.  COMPARISON:  CT the abdomen pelvis - 06/24/2014; 02/08/2014  FINDINGS: The examination is degraded secondary to patient motion artifact. There is a faint ill-defined  area of persistent radiotracer activity within the right lower abdominal quadrant seen on the initial 60 min of planar imaging. There is a rapid worsening of this radiotracer seen on the provided 110 min anterior projection planar images with subsequent retrograde flow into the distal sigmoid, ascending and transverse colon.  Expected physiologic activity is seen within the hepatic parenchyma an intravascular blood pool of the abdomen and pelvis. Excreted free radiotracer is seen within the urinary bladder.  IMPRESSION: Examination is positive for acute lower GI bleed with source likely within the sigmoid colon.  Critical Value/emergent results were called by telephone at the time of interpretation on 06/24/2014 at 8:06 pm to Dr. Zenovia Jarred, who verbally acknowledged these results.   Electronically Signed   By: Sandi Mariscal M.D.   On: 06/24/2014 20:22   Ct Abdomen Pelvis W Contrast  06/24/2014   CLINICAL DATA:  GI bleeding.  Pain.  EXAM: CT ABDOMEN AND PELVIS WITH CONTRAST  TECHNIQUE: Multidetector CT imaging of the abdomen and pelvis was performed using the standard protocol following bolus administration of intravenous contrast.  CONTRAST:  66mL OMNIPAQUE IOHEXOL 300 MG/ML  SOLN  COMPARISON:  CT 02/08/2014.  FINDINGS: The liver has an irregular contour suggesting cirrhosis. Multiple new hepatic low-density lesions are noted. The largest measures 2.6 cm, image number 18/series 2. Although these could represent regenerating nodules, metastatic disease or multifocal hepatocellular carcinoma cannot be excluded. There is a new enhancing 2.2 cm nodule in the dome of the liver with similar differential,  image number 12/series 2. Spleen is normal. Perigastric and periportal serpiginous densities are noted most likely varices. Periportal adenopathy cannot be excluded. Partial thrombosis of the portal vein noted. Multiple retroperitoneal varices are present. Varicosities of the mesenteric venous system noted. Pelvic  varicosities particularly prominent. Splenic vein patent. Hepatic veins patent. No biliary distention. Gallbladder wall edema noted, this may be from hypoproteinemia. Mild ascites present.  Adrenals normal. No focal renal abnormality. No hydronephrosis or obstructing ureteral stone. The bladder is unremarkable. Calcification within the prostate.  No significant adenopathy. Aorta normal caliber with atherosclerotic vascular changes. Celiac and mesenteric arteries patent.  Appendix normal. Stool noted throughout the colon. Umbilical hernia is noted with what appears to be herniated small bowel. Mild proximal and mid small bowel distention is present. This could be secondary to partial obstruction. Diffuse small bowel wall edema noted. Although edema could be secondary to the hernia and partial obstruction, small bowel wall edema may also be secondary to hyperproteinemia and vascular congestion given the patient's partial portal vein thrombosis and varices. No pneumatosis intestinalis. No portal venous air. No free air.  Lung bases clear. Coronary artery disease. Mild cardiomegaly. Degenerative changes lumbar spine and both hips.  IMPRESSION: 1. Liver appears cirrhotic. Multiple new hepatic lesions are noted. Differential diagnosis includes regenerating nodules, metastatic disease, multifocal hepatocellular carcinoma. 2. Partial thrombosis of the portal vein. 3. Portal hypertension with multiple varices. 4. Periportal adenopathy cannot be excluded. 5. Umbilical hernia with herniation of small bowel. Partial small bowel obstruction cannot be excluded. 6. Extensive small bowel wall edema, although this could be related to partial small bowel obstruction, hyperproteinemia and vascular congestion could also result in bowel wall edema. No pneumatosis intestinalis. No portal venous air. No free air.   Electronically Signed   By: Marcello Moores  Register   On: 06/24/2014 13:03   Ir Angiogram Visceral Selective  06/25/2014    CLINICAL DATA:  Massive acute lower GI bleed with severe anemia and hypotension. Bleeding scan suggested bleeding from the distal colon.  EXAM: 1. ULTRASOUND GUIDANCE FOR VASCULAR ACCESS OF THE RIGHT COMMON FEMORAL ARTERY 2. SELECTIVE ARTERIOGRAPHY OF THE INFERIOR MESENTERIC ARTERY 3. SELECTIVE ARTERIOGRAPHY OF THE SUPERIOR MESENTERIC ARTERY 4. SELECTIVE ARTERIOGRAPHY OF THE LEFT GASTRIC ARTERY  MEDICATIONS: 200 mcg intra-arterial nitroglycerin  CONTRAST:  121mL VISIPAQUE IODIXANOL 320 MG/ML IV SOLN  FLUOROSCOPY TIME:  15 min and 48 seconds.  PROCEDURE: The patient was intubated and unresponsive during the procedure. Emergent consent was obtained from the patient's father for arteriography with possible embolization.  The right groin was prepped with Betadine and draped. Maximal Sterile Barrier Technique was utilized including caps, mask, sterile gowns, sterile gloves, sterile drape, hand hygiene and skin antiseptic.  Ultrasound was used to confirm patency of the right common femoral artery. Access of the artery was performed with a 21 gauge needle under direct ultrasound guidance. After securing access with a micropuncture set, a 5 French sheath was placed over a guidewire.  A 5 [place] cobra catheter was introduced into the abdominal aorta. The inferior mesenteric artery was selectively catheterized. Selective arteriography was performed prior to, and following, administration of 100 mcg intra-arterial nitroglycerin directly into the IMA.  The catheter was then used to selectively catheterize the superior mesenteric artery. Selective arteriography was performed prior to, and following, administration of 100 mcg intra-arterial nitroglycerin.  The catheter was then used to selectively catheterize the left gastric artery. Selective arteriography was performed.  The catheter was removed. Hemostasis was obtained at the arteriotomy with the  Cordis ExoSeal device.  FINDINGS: Initial inferior mesenteric arteriography shows  severe vasoconstriction, limiting visualization of distal branch vessels. After administration of nitroglycerin, improved visualization of arteries was identified. There is no evidence of active arterial bleeding, vascular malformation or other vascular abnormality within the IMA territory.  SMA arteriography demonstrated similar severe vasoconstriction which did show some improvement after administration of nitroglycerin. Arteriography demonstrates no evidence of active arterial bleeding, vascular malformation or other vascular abnormality.  In attempting catheterization of the celiac axis, selective arteriography was performed of a left gastric artery emanating from the abdominal aorta. 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 Injection at the time of SMA arteriography was likely distal to the hepatic and splenic arteries.  IMPRESSION: Negative visceral arteriography for arterial bleeding source in the distribution of the inferior mesenteric artery, superior mesenteric artery and left gastric artery. 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   Electronically Signed   By: Aletta Edouard M.D.   On: 06/25/2014 11:08   Ir Angiogram Visceral Selective  06/25/2014   CLINICAL DATA:  Massive acute lower GI bleed with severe anemia and hypotension. Bleeding scan suggested bleeding from the distal colon.  EXAM: 1. ULTRASOUND GUIDANCE FOR VASCULAR ACCESS OF THE RIGHT COMMON FEMORAL ARTERY 2. SELECTIVE ARTERIOGRAPHY OF THE INFERIOR MESENTERIC ARTERY 3. SELECTIVE ARTERIOGRAPHY OF THE SUPERIOR MESENTERIC ARTERY 4. SELECTIVE ARTERIOGRAPHY OF THE LEFT GASTRIC ARTERY  MEDICATIONS: 200 mcg intra-arterial nitroglycerin  CONTRAST:  143mL VISIPAQUE IODIXANOL 320 MG/ML IV SOLN  FLUOROSCOPY TIME:  15 min and 48 seconds.  PROCEDURE: The patient was intubated and  unresponsive during the procedure. Emergent consent was obtained from the patient's father for arteriography with possible embolization.  The right groin was prepped with Betadine and draped. Maximal Sterile Barrier Technique was utilized including caps, mask, sterile gowns, sterile gloves, sterile drape, hand hygiene and skin antiseptic.  Ultrasound was used to confirm patency of the right common femoral artery. Access of the artery was performed with a 21 gauge needle under direct ultrasound guidance. After securing access with a micropuncture set, a 5 French sheath was placed over a guidewire.  A 5 [place] cobra catheter was introduced into the abdominal aorta. The inferior mesenteric artery was selectively catheterized. Selective arteriography was performed prior to, and following, administration of 100 mcg intra-arterial nitroglycerin directly into the IMA.  The catheter was then used to selectively catheterize the superior mesenteric artery. Selective arteriography was performed prior to, and following, administration of 100 mcg intra-arterial nitroglycerin.  The catheter was then used to selectively catheterize the left gastric artery. Selective arteriography was performed.  The catheter was removed. Hemostasis was obtained at the arteriotomy with the Cordis ExoSeal device.  FINDINGS: Initial inferior mesenteric arteriography shows severe vasoconstriction, limiting visualization of distal branch vessels. After administration of nitroglycerin, improved visualization of arteries was identified. There is no evidence of active arterial bleeding, vascular malformation or other vascular abnormality within the IMA territory.  SMA arteriography demonstrated similar severe vasoconstriction which did show some improvement after administration of nitroglycerin. Arteriography demonstrates no evidence of active arterial bleeding, vascular malformation or other vascular abnormality.  In attempting catheterization of the  celiac axis, selective arteriography was performed of a left gastric artery emanating from the abdominal aorta. A separate celiac axis was not able to be located and by prior CT imaging, it  appears that there likely is a common celiacomesenteric trunk. Injection at the time of SMA arteriography was likely distal to the hepatic and splenic arteries.  IMPRESSION: Negative visceral arteriography for arterial bleeding source in the distribution of the inferior mesenteric artery, superior mesenteric artery and left gastric artery. 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   Electronically Signed   By: Aletta Edouard M.D.   On: 06/25/2014 11:08   Ir Angiogram Visceral Selective  06/25/2014   CLINICAL DATA:  Massive acute lower GI bleed with severe anemia and hypotension. Bleeding scan suggested bleeding from the distal colon.  EXAM: 1. ULTRASOUND GUIDANCE FOR VASCULAR ACCESS OF THE RIGHT COMMON FEMORAL ARTERY 2. SELECTIVE ARTERIOGRAPHY OF THE INFERIOR MESENTERIC ARTERY 3. SELECTIVE ARTERIOGRAPHY OF THE SUPERIOR MESENTERIC ARTERY 4. SELECTIVE ARTERIOGRAPHY OF THE LEFT GASTRIC ARTERY  MEDICATIONS: 200 mcg intra-arterial nitroglycerin  CONTRAST:  120mL VISIPAQUE IODIXANOL 320 MG/ML IV SOLN  FLUOROSCOPY TIME:  15 min and 48 seconds.  PROCEDURE: The patient was intubated and unresponsive during the procedure. Emergent consent was obtained from the patient's father for arteriography with possible embolization.  The right groin was prepped with Betadine and draped. Maximal Sterile Barrier Technique was utilized including caps, mask, sterile gowns, sterile gloves, sterile drape, hand hygiene and skin antiseptic.  Ultrasound was used to confirm patency of the right common femoral artery. Access of the artery was performed with a 21 gauge needle under direct ultrasound guidance. After securing access with a micropuncture set, a 5 French sheath was placed  over a guidewire.  A 5 [place] cobra catheter was introduced into the abdominal aorta. The inferior mesenteric artery was selectively catheterized. Selective arteriography was performed prior to, and following, administration of 100 mcg intra-arterial nitroglycerin directly into the IMA.  The catheter was then used to selectively catheterize the superior mesenteric artery. Selective arteriography was performed prior to, and following, administration of 100 mcg intra-arterial nitroglycerin.  The catheter was then used to selectively catheterize the left gastric artery. Selective arteriography was performed.  The catheter was removed. Hemostasis was obtained at the arteriotomy with the Cordis ExoSeal device.  FINDINGS: Initial inferior mesenteric arteriography shows severe vasoconstriction, limiting visualization of distal branch vessels. After administration of nitroglycerin, improved visualization of arteries was identified. There is no evidence of active arterial bleeding, vascular malformation or other vascular abnormality within the IMA territory.  SMA arteriography demonstrated similar severe vasoconstriction which did show some improvement after administration of nitroglycerin. Arteriography demonstrates no evidence of active arterial bleeding, vascular malformation or other vascular abnormality.  In attempting catheterization of the celiac axis, selective arteriography was performed of a left gastric artery emanating from the abdominal aorta. 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 Injection at the time of SMA arteriography was likely distal to the hepatic and splenic arteries.  IMPRESSION: Negative visceral arteriography for arterial bleeding source in the distribution of the inferior mesenteric artery, superior mesenteric artery and left gastric artery. Bleeding source may be secondary to prominent mesenteric  varices identified by CT which extend to abut the rectum.   Electronically Signed   By: Aletta Edouard M.D.   On: 06/25/2014 11:08   Ir US Guide Vasc Access Right  06/25/2014   CLINICAL DATA:  Massive acute lower GI bleed with severe anemia and hypotension. Bleeding scan suggested bleeding from the distal  colon.  EXAM: 1. ULTRASOUND GUIDANCE FOR VASCULAR ACCESS OF THE RIGHT COMMON FEMORAL ARTERY 2. SELECTIVE ARTERIOGRAPHY OF THE INFERIOR MESENTERIC ARTERY 3. SELECTIVE ARTERIOGRAPHY OF THE SUPERIOR MESENTERIC ARTERY 4. SELECTIVE ARTERIOGRAPHY OF THE LEFT GASTRIC ARTERY  MEDICATIONS: 200 mcg intra-arterial nitroglycerin  CONTRAST:  136mL VISIPAQUE IODIXANOL 320 MG/ML IV SOLN  FLUOROSCOPY TIME:  15 min and 48 seconds.  PROCEDURE: The patient was intubated and unresponsive during the procedure. Emergent consent was obtained from the patient's father for arteriography with possible embolization.  The right groin was prepped with Betadine and draped. Maximal Sterile Barrier Technique was utilized including caps, mask, sterile gowns, sterile gloves, sterile drape, hand hygiene and skin antiseptic.  Ultrasound was used to confirm patency of the right common femoral artery. Access of the artery was performed with a 21 gauge needle under direct ultrasound guidance. After securing access with a micropuncture set, a 5 French sheath was placed over a guidewire.  A 5 [place] cobra catheter was introduced into the abdominal aorta. The inferior mesenteric artery was selectively catheterized. Selective arteriography was performed prior to, and following, administration of 100 mcg intra-arterial nitroglycerin directly into the IMA.  The catheter was then used to selectively catheterize the superior mesenteric artery. Selective arteriography was performed prior to, and following, administration of 100 mcg intra-arterial nitroglycerin.  The catheter was then used to selectively catheterize the left gastric artery. Selective  arteriography was performed.  The catheter was removed. Hemostasis was obtained at the arteriotomy with the Cordis ExoSeal device.  FINDINGS: Initial inferior mesenteric arteriography shows severe vasoconstriction, limiting visualization of distal branch vessels. After administration of nitroglycerin, improved visualization of arteries was identified. There is no evidence of active arterial bleeding, vascular malformation or other vascular abnormality within the IMA territory.  SMA arteriography demonstrated similar severe vasoconstriction which did show some improvement after administration of nitroglycerin. Arteriography demonstrates no evidence of active arterial bleeding, vascular malformation or other vascular abnormality.  In attempting catheterization of the celiac axis, selective arteriography was performed of a left gastric artery emanating from the abdominal aorta. 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 Injection at the time of SMA arteriography was likely distal to the hepatic and splenic arteries.  IMPRESSION: Negative visceral arteriography for arterial bleeding source in the distribution of the inferior mesenteric artery, superior mesenteric artery and left gastric artery. 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   Electronically Signed   By: Aletta Edouard M.D.   On: 06/25/2014 11:08   Dg Chest Port 1 View  06/25/2014   CLINICAL DATA:  Check endotracheal tube  EXAM: PORTABLE CHEST - 1 VIEW  COMPARISON:  Chest x-ray from yesterday  FINDINGS: New endotracheal tube, tip at the clavicular heads. Unchanged positioning of a left IJ catheter.  Lower lung volumes with interstitial crowding and possible left lower lobe segmental atelectasis. There is no evidence of effusion or pneumothorax.  IMPRESSION: 1. New endotracheal tube is in  good position. 2. Low lung volumes and basilar atelectasis.   Electronically Signed   By: Jorje Guild M.D.   On: 06/25/2014 03:44   Dg Chest Port 1 View  06/24/2014   CLINICAL DATA:  Post central line placement  EXAM: PORTABLE CHEST - 1 VIEW  COMPARISON:  06/24/2014; 01/21/2014  FINDINGS: Grossly unchanged enlarged cardiac silhouette and mediastinal contours with atherosclerotic  plaque within the thoracic aorta. Interval placement of left jugular approach intravenous catheter with tip projected over the central aspect of the left innominate vein. No pneumothorax. Grossly unchanged bilateral infrahilar heterogeneous opacities. No new focal airspace opacities. No pleural effusion. No evidence of edema. Unchanged bones.  IMPRESSION: 1. Interval placement of left jugular approach central venous catheter with tip projected over the central aspect of the left innominate vein. 2. Persistently reduced lung volumes with grossly unchanged infrahilar opacities, likely atelectasis.   Electronically Signed   By: Sandi Mariscal M.D.   On: 06/24/2014 21:46   Dg Chest Portable 1 View  06/24/2014   CLINICAL DATA:  Shortness of breath and hypotension  EXAM: PORTABLE CHEST - 1 VIEW  COMPARISON:  PA and lateral chest x-ray of January 21, 2014  FINDINGS: The lungs are hypoinflated but clear. The cardiac silhouette is mildly enlarged. The pulmonary vascularity is normal. There is no pleural effusion. The bony thorax is unremarkable.  IMPRESSION: The study is limited due to hypoinflation. There is new enlargement of the cardiac silhouette without evidence of pulmonary edema.   Electronically Signed   By: David  Martinique   On: 06/24/2014 11:33   Dg Chest Port 1v Same Day  06/26/2014   CLINICAL DATA:  Intubated patient.  EXAM: PORTABLE CHEST - 1 VIEW SAME DAY  COMPARISON:  06/25/2014.  FINDINGS: Mild persistent perihilar and medial lung base atelectasis. Lungs are otherwise clear.  Cardiac silhouette is normal in size.  Normal  mediastinal contour.  Endotracheal tube is stable in well positioned.  New nasogastric tube passes below the diaphragm to curl within the stomach.  IMPRESSION: 1. New nasogastric tube is well positioned curling within the stomach. 2. No other change.  No acute findings in the lungs.   Electronically Signed   By: Lajean Manes M.D.   On: 06/26/2014 09:11   Dg Abd Portable 1v  06/25/2014   CLINICAL DATA:  53 year old male undergoing enteric tube placement.  EXAM: PORTABLE ABDOMEN - 1 VIEW  COMPARISON:  Portable chest radiograph 0309 hr the same day and earlier.  FINDINGS: Two portable AP views of the abdomen at 0959 hrs. Enteric tube projects over the proximal stomach, side hole at the level of the gastric fundus.  Increased dilated gas-filled small bowel loops, measuring up to 39 mm diameter. Similar volume of large bowel gas compared to recent CT Abdomen and Pelvis. Excreted IV contrast in the bladder. No definite pneumoperitoneum. No acute osseous abnormality identified.  IMPRESSION: 1. Enteric tube placed, side hole at the level of the gastric fundus. 2. New small bowel obstruction gas pattern.  No definite free air.   Electronically Signed   By: Lars Pinks M.D.   On: 06/25/2014 10:44    Anti-infectives: Anti-infectives   Start     Dose/Rate Route Frequency Ordered Stop   06/25/14 0345  cefTRIAXone (ROCEPHIN) 1 g in dextrose 5 % 50 mL IVPB     1 g 100 mL/hr over 30 Minutes Intravenous Every 24 hours 06/25/14 0344        Assessment/Plan: s/p Procedure(s): oversew of rectal varices (N/A) Con't with packing in place.   If rebleeds, pt would likely need a rectal tube balloon in place inflated to tamponde the bleed.  LOS: 2 days    Rosario Jacks., [facility] 06/26/2014

## 2014-06-27 ENCOUNTER — Inpatient Hospital Stay (HOSPITAL_COMMUNITY): Payer: Medicaid Other

## 2014-06-27 LAB — CBC
HCT: 25.7 % — ABNORMAL LOW (ref 39.0–52.0)
HEMATOCRIT: 19.2 % — AB (ref 39.0–52.0)
HEMATOCRIT: 21.3 % — AB (ref 39.0–52.0)
HEMATOCRIT: 22.7 % — AB (ref 39.0–52.0)
HEMOGLOBIN: 6.9 g/dL — AB (ref 13.0–17.0)
HEMOGLOBIN: 7.6 g/dL — AB (ref 13.0–17.0)
HEMOGLOBIN: 8 g/dL — AB (ref 13.0–17.0)
HEMOGLOBIN: 8.8 g/dL — AB (ref 13.0–17.0)
MCH: 29.5 pg (ref 26.0–34.0)
MCH: 29.1 pg (ref 26.0–34.0)
MCH: 29.2 pg (ref 26.0–34.0)
MCH: 29.2 pg (ref 26.0–34.0)
MCHC: 35.9 g/dL (ref 30.0–36.0)
MCHC: 35.7 g/dL (ref 30.0–36.0)
MCHC: 35.2 g/dL (ref 30.0–36.0)
MCHC: 34.2 g/dL (ref 30.0–36.0)
MCV: 82.1 fL (ref 78.0–100.0)
MCV: 81.6 fL (ref 78.0–100.0)
MCV: 82.8 fL (ref 78.0–100.0)
MCV: 85.4 fL (ref 78.0–100.0)
Platelets: 51 10*3/uL — ABNORMAL LOW (ref 150–400)
Platelets: 47 10*3/uL — ABNORMAL LOW (ref 150–400)
Platelets: 53 10*3/uL — ABNORMAL LOW (ref 150–400)
Platelets: 46 10*3/uL — ABNORMAL LOW (ref 150–400)
RBC: 2.34 MIL/uL — ABNORMAL LOW (ref 4.22–5.81)
RBC: 2.61 MIL/uL — ABNORMAL LOW (ref 4.22–5.81)
RBC: 2.74 MIL/uL — ABNORMAL LOW (ref 4.22–5.81)
RBC: 3.01 MIL/uL — ABNORMAL LOW (ref 4.22–5.81)
RDW: 15.8 % — ABNORMAL HIGH (ref 11.5–15.5)
RDW: 15.1 % (ref 11.5–15.5)
RDW: 15.4 % (ref 11.5–15.5)
RDW: 15.8 % — ABNORMAL HIGH (ref 11.5–15.5)
WBC: 10.1 10*3/uL (ref 4.0–10.5)
WBC: 11.1 10*3/uL — AB (ref 4.0–10.5)
WBC: 13.5 10*3/uL — ABNORMAL HIGH (ref 4.0–10.5)
WBC: 12.5 10*3/uL — ABNORMAL HIGH (ref 4.0–10.5)

## 2014-06-27 LAB — BLOOD GAS, ARTERIAL
Acid-Base Excess: 9 mmol/L — ABNORMAL HIGH (ref 0.0–2.0)
Bicarbonate: 32.5 mEq/L — ABNORMAL HIGH (ref 20.0–24.0)
DRAWN BY: 276051
FIO2: 0.4 %
LHR: 14 {breaths}/min
MECHVT: 450 mL
O2 SAT: 99.4 %
PATIENT TEMPERATURE: 97.6
PEEP/CPAP: 5 cmH2O
TCO2: 33.8 mmol/L (ref 0–100)
pCO2 arterial: 39.1 mmHg (ref 35.0–45.0)
pH, Arterial: 7.528 — ABNORMAL HIGH (ref 7.350–7.450)
pO2, Arterial: 149 mmHg — ABNORMAL HIGH (ref 80.0–100.0)

## 2014-06-27 LAB — PREPARE FRESH FROZEN PLASMA
UNIT DIVISION: 0
Unit division: 0

## 2014-06-27 LAB — COMPREHENSIVE METABOLIC PANEL
ALK PHOS: 79 U/L (ref 39–117)
ALT: 430 U/L — ABNORMAL HIGH (ref 0–53)
AST: 2152 U/L — ABNORMAL HIGH (ref 0–37)
Albumin: 2.4 g/dL — ABNORMAL LOW (ref 3.5–5.2)
Anion gap: 23 — ABNORMAL HIGH (ref 5–15)
BUN: 42 mg/dL — AB (ref 6–23)
CO2: 24 meq/L (ref 19–32)
Calcium: 6.1 mg/dL — CL (ref 8.4–10.5)
Chloride: 100 mEq/L (ref 96–112)
Creatinine, Ser: 3.8 mg/dL — ABNORMAL HIGH (ref 0.50–1.35)
GFR calc non Af Amer: 17 mL/min — ABNORMAL LOW (ref >90)
GFR, EST AFRICAN AMERICAN: 19 mL/min — AB (ref >90)
GLUCOSE: 214 mg/dL — AB (ref 70–99)
POTASSIUM: 3 meq/L — AB (ref 3.7–5.3)
Sodium: 147 mEq/L (ref 137–147)
TOTAL PROTEIN: 4.7 g/dL — AB (ref 6.0–8.3)
Total Bilirubin: 2.3 mg/dL — ABNORMAL HIGH (ref 0.3–1.2)

## 2014-06-27 LAB — PREPARE PLATELET PHERESIS: UNIT DIVISION: 0

## 2014-06-27 LAB — POCT I-STAT 3, ART BLOOD GAS (G3+)
Acid-Base Excess: 15 mmol/L — ABNORMAL HIGH (ref 0.0–2.0)
Acid-Base Excess: 15 mmol/L — ABNORMAL HIGH (ref 0.0–2.0)
Acid-Base Excess: 13 mmol/L — ABNORMAL HIGH (ref 0.0–2.0)
BICARBONATE: 33.9 meq/L — AB (ref 20.0–24.0)
Bicarbonate: 38.6 mEq/L — ABNORMAL HIGH (ref 20.0–24.0)
Bicarbonate: 36.8 mEq/L — ABNORMAL HIGH (ref 20.0–24.0)
O2 SAT: 99 %
O2 Saturation: 97 %
O2 Saturation: 99 %
PH ART: 7.875 — AB (ref 7.350–7.450)
Patient temperature: 97.9
Patient temperature: 98.6
TCO2: 34 mmol/L (ref 0–100)
TCO2: 40 mmol/L (ref 0–100)
TCO2: 38 mmol/L (ref 0–100)
pCO2 arterial: 18.3 mmHg — CL (ref 35.0–45.0)
pCO2 arterial: 39.7 mmHg (ref 35.0–45.0)
pCO2 arterial: 43.6 mmHg (ref 35.0–45.0)
pH, Arterial: 7.595 — ABNORMAL HIGH (ref 7.350–7.450)
pH, Arterial: 7.534 — ABNORMAL HIGH (ref 7.350–7.450)
pO2, Arterial: 54 mmHg — ABNORMAL LOW (ref 80.0–100.0)
pO2, Arterial: 120 mmHg — ABNORMAL HIGH (ref 80.0–100.0)
pO2, Arterial: 125 mmHg — ABNORMAL HIGH (ref 80.0–100.0)

## 2014-06-27 LAB — GLUCOSE, CAPILLARY
GLUCOSE-CAPILLARY: 145 mg/dL — AB (ref 70–99)
Glucose-Capillary: 130 mg/dL — ABNORMAL HIGH (ref 70–99)
Glucose-Capillary: 139 mg/dL — ABNORMAL HIGH (ref 70–99)

## 2014-06-27 LAB — PROTIME-INR
INR: 1.98 — ABNORMAL HIGH (ref 0.00–1.49)
Prothrombin Time: 22.5 seconds — ABNORMAL HIGH (ref 11.6–15.2)

## 2014-06-27 LAB — PHOSPHORUS: Phosphorus: 4.2 mg/dL (ref 2.3–4.6)

## 2014-06-27 LAB — LACTIC ACID, PLASMA: LACTIC ACID, VENOUS: 5.7 mmol/L — AB (ref 0.5–2.2)

## 2014-06-27 LAB — PREPARE RBC (CROSSMATCH)

## 2014-06-27 LAB — MAGNESIUM: Magnesium: 1.1 mg/dL — ABNORMAL LOW (ref 1.5–2.5)

## 2014-06-27 MED ORDER — POTASSIUM CHLORIDE 20 MEQ/15ML (10%) PO LIQD
40.0000 meq | Freq: Two times a day (BID) | ORAL | Status: AC
  Administered 2014-06-27 (×2): 40 meq
  Filled 2014-06-27 (×3): qty 30

## 2014-06-27 MED ORDER — INSULIN ASPART 100 UNIT/ML ~~LOC~~ SOLN
0.0000 [IU] | SUBCUTANEOUS | Status: DC
  Administered 2014-06-27 – 2014-06-28 (×4): 1 [IU] via SUBCUTANEOUS
  Administered 2014-06-28 (×2): 2 [IU] via SUBCUTANEOUS
  Administered 2014-06-28: 1 [IU] via SUBCUTANEOUS
  Administered 2014-06-29: 2 [IU] via SUBCUTANEOUS
  Administered 2014-06-29: 1 [IU] via SUBCUTANEOUS
  Administered 2014-06-29: 2 [IU] via SUBCUTANEOUS

## 2014-06-27 MED ORDER — VITAL HIGH PROTEIN PO LIQD
1000.0000 mL | ORAL | Status: DC
  Administered 2014-06-27: 1000 mL
  Filled 2014-06-27 (×3): qty 1000

## 2014-06-27 MED ORDER — DEXTROSE 5 % IV SOLN
INTRAVENOUS | Status: DC
  Administered 2014-06-27 – 2014-07-01 (×5): via INTRAVENOUS

## 2014-06-27 MED ORDER — MAGNESIUM SULFATE 40 MG/ML IJ SOLN
2.0000 g | Freq: Once | INTRAMUSCULAR | Status: AC
  Administered 2014-06-27: 2 g via INTRAVENOUS
  Filled 2014-06-27: qty 50

## 2014-06-27 MED ORDER — PRO-STAT SUGAR FREE PO LIQD
30.0000 mL | Freq: Two times a day (BID) | ORAL | Status: DC
  Administered 2014-06-27 – 2014-06-28 (×3): 30 mL
  Filled 2014-06-27 (×4): qty 30

## 2014-06-27 NOTE — Progress Notes (Signed)
PT Cancellation/Discharge Note  Patient Details Name: Edwin Love MRN: [id number] DOB: 11/28/1961   Cancelled Treatment:    Reason Eval/Treat Not Completed: Medical issues which prohibited therapy.  Recent events noted.  PT consult has been cancelled due to medical issues.  Please re-consult PT if appropriate in future.    Despina Pole 06/27/2014, 12:54 PM Carita Pian. Sanjuana Kava, [place] Pager [phone]

## 2014-06-27 NOTE — Progress Notes (Signed)
[facility] Progress Note Patient Name: Edwin Love DOB: 09-16-1961 MRN: [id number]  Date of Service  06/27/2014   HPI/Events of Note  HGB post 1 u pRBC and 4 u FFP is now 6.9 from 6.7   eICU Interventions  Plan: Transfuse 1 unit pRBC Post-transfusion CBC   Intervention Category Intermediate Interventions: Bleeding - evaluation and treatment with blood products  DETERDING,[name] 06/27/2014, 4:34 AM

## 2014-06-27 NOTE — Progress Notes (Signed)
2 Days Post-Op  Subjective: Recd some blood products yesterday (blood and FFP). Reports BMs have turned green. Off pressors  Objective: Vital signs in last 24 hours: Temp:  [98.4 F (36.9 C)-99.3 F (37.4 C)] 98.6 F (37 C) (07/19 0555) Pulse Rate:  [85-107] 88 (07/19 0800) Resp:  [9-30] 30 (07/19 0800) BP: (90-154)/(34-93) 121/75 mmHg (07/19 0800) SpO2:  [96 %-100 %] 99 % (07/19 0800) Arterial Line BP: (94-165)/(28-79) 145/70 mmHg (07/19 0800) FiO2 (%):  [40 %] 40 % (07/19 0308) Weight:  [218 lb 7.6 oz (99.1 kg)] 218 lb 7.6 oz (99.1 kg) (07/19 0500) Last BM Date: 06/26/14  Intake/Output from previous day: 07/18 0701 - 07/19 0700 In: 8635.9 [I.V.:5859.8; Blood:2756.1; NG/GT:20] Out: 1028 [Urine:1025; Stool:3] Intake/Output this shift: Total I/O In: 266.5 [I.V.:246.5; NG/GT:20] Out: 125 [Urine:125]  Intubated sedated abd - obese, soft, no grimace on palpation  Lab Results:   Recent Labs  06/26/14 2011 06/27/14 0248  WBC 11.9* 10.1  HGB 6.7* 6.9*  HCT 18.5* 19.2*  PLT 74* 51*   BMET  Recent Labs  06/26/14 0509 06/27/14 0248  NA 145 147  K 4.8 3.0*  CL 105 100  CO2 9* 24  GLUCOSE 105* 214*  BUN 30* 42*  CREATININE 3.50* 3.80*  CALCIUM 6.5* 6.1*   PT/INR  Recent Labs  06/26/14 2011 06/27/14 0248  LABPROT 27.3* 22.5*  INR 2.53* 1.98*   ABG  Recent Labs  06/25/14 1930 06/26/14 0857  PHART 7.314* 7.359  HCO3 12.5* 11.4*    Studies/Results: Dg Chest Port 1 View  06/27/2014   CLINICAL DATA:  Re-intubation.  EXAM: PORTABLE CHEST - 1 VIEW  COMPARISON:  06/26/2014  FINDINGS: Endotracheal tube tip projects 4.6 cm above the carina.  Left internal jugular central venous line and nasogastric tube are stable.  Opacity has developed at the left lung base obscuring the hemidiaphragm. This may reflect atelectasis with pleural fluid. Pneumonia or other infiltrate is possible. Mild central vascular congestion is stable.  No pneumothorax.  No right effusion.   IMPRESSION: 1. Endotracheal tube tip projects 4.6 cm above the carina. 2. Other support apparatus is stable. 3. New opacity at the left lung base. This may reflect pneumonia. It could be due to atelectasis and pleural fluid.   Electronically Signed   By: Lajean Manes M.D.   On: 06/27/2014 07:37   Dg Chest Port 1v Same Day  06/26/2014   CLINICAL DATA:  Intubated patient.  EXAM: PORTABLE CHEST - 1 VIEW SAME DAY  COMPARISON:  06/25/2014.  FINDINGS: Mild persistent perihilar and medial lung base atelectasis. Lungs are otherwise clear.  Cardiac silhouette is normal in size.  Normal mediastinal contour.  Endotracheal tube is stable in well positioned.  New nasogastric tube passes below the diaphragm to curl within the stomach.  IMPRESSION: 1. New nasogastric tube is well positioned curling within the stomach. 2. No other change.  No acute findings in the lungs.   Electronically Signed   By: Lajean Manes M.D.   On: 06/26/2014 09:11   Dg Abd Portable 1v  06/25/2014   CLINICAL DATA:  53 year old male undergoing enteric tube placement.  EXAM: PORTABLE ABDOMEN - 1 VIEW  COMPARISON:  Portable chest radiograph 0309 hr the same day and earlier.  FINDINGS: Two portable AP views of the abdomen at 0959 hrs. Enteric tube projects over the proximal stomach, side hole at the level of the gastric fundus.  Increased dilated gas-filled small bowel loops, measuring up to 39 mm diameter.  Similar volume of large bowel gas compared to recent CT Abdomen and Pelvis. Excreted IV contrast in the bladder. No definite pneumoperitoneum. No acute osseous abnormality identified.  IMPRESSION: 1. Enteric tube placed, side hole at the level of the gastric fundus. 2. New small bowel obstruction gas pattern.  No definite free air.   Electronically Signed   By: Lars Pinks M.D.   On: 06/25/2014 10:44    Anti-infectives: Anti-infectives   Start     Dose/Rate Route Frequency Ordered Stop   06/25/14 0345  cefTRIAXone (ROCEPHIN) 1 g in dextrose 5 %  50 mL IVPB     1 g 100 mL/hr over 30 Minutes Intravenous Every 24 hours 06/25/14 0344        Assessment/Plan: s/p Procedure(s): oversew of rectal varices (N/A) Avoid NSAIDs, chemical VTE prophylaxis  If rebleeds, pt would likely need a rectal tube balloon in place inflated to tamponde the bleed. Not a candidate for operative shunt  Leighton Ruff. Redmond Pulling, MD, FACS General, Bariatric, & Minimally Invasive Surgery [facility], [place]    LOS: 3 days    Gayland Curry 06/27/2014

## 2014-06-27 NOTE — Progress Notes (Signed)
25 ml versed gtt wasted in the sink.  Witnessed by Elliot Gurney RN, BSN.  Richardean Canal RN, BSN, CCRN

## 2014-06-27 NOTE — Progress Notes (Signed)
[facility] Progress Note Patient Name: Edwin Love DOB: 12-05-1961 MRN: [id number]  Date of Service  06/27/2014   HPI/Events of Note   Follow-up ABG noted. Still significant metabolic alkalosis.  eICU Interventions   Decreased TV to 6 mg/kg, repeat ABG in 2 hrs.    Intervention Category Major Interventions: Acid-Base disturbance - evaluation and management  Yuleidy Rappleye R. 06/27/2014, 4:12 PM

## 2014-06-27 NOTE — Progress Notes (Signed)
Patient ID: Edwin Love, male   DOB: 1961-06-17, 53 y.o.   MRN: [id number]  No evidence of new bleeding. INR down, but Cr higher. MELD score is 30 today. Not candidate for TIPS currently.  Will follow.

## 2014-06-27 NOTE — Progress Notes (Signed)
Respiratory Rate decreased to 10 by Bronson Curb , RN per Md, Balanger Elink. Respiratory Therapist Chriss Driver made aware of vent changes.

## 2014-06-27 NOTE — Progress Notes (Signed)
PULMONARY / CRITICAL CARE MEDICINE   Name: Edwin Love MRN: [id number] DOB: 30-Dec-1960    ADMISSION DATE: 06/24/2014  CONSULTATION DATE: 06/24/2014   REFERRING MD : [name]  PRIMARY SERVICE: PCCM   CHIEF COMPLAINT: Lower GI Hemorrhage   BRIEF PATIENT DESCRIPTION:  53 y.o. M with cirrhosis and esophageal varices brought to ED after being found lying in pool of blood by family. Underwent EGD 7/16 which was negative for UGIB; however, pt continued to have massive hematochezia. Bleeding scan positive for LGIB in sigmoid colon. PCCM consulted and pt admitted to ICU.   SIGNIFICANT EVENTS / STUDIES:  7/16 Abd CT >>> cirrhosis, multiple new hepatic lesions are noted, partial thrombosis of portal vein, portal HTN with multiple varices, periportal adenopathy cannot be excluded, umbilical hernia with herniation of small bowel, extensive small bowel wall edema. 7/16 EGD >>> varices present; however, no acute bleeding.  7/16 Bleeding Scan >>> positive for acute lower GI bleed with source likely within the sigmoid colon.  7/17 Continued brisk BRBPR, shock, altered MS 7/17 Intubated 7/17 Angiogram of IMA, SMA and left gastric artery -No acute arterial bleeding source identified 7/17 Colonscopy w/ active rectal bleeding/cardiac arrest w/ PEA/CPR  7/17 >To OR large rectal varices bleed>oversewn  7/18 Remains intubated. Worsening met acidosis. HCO3 infusion increased. Received 1 unit PRBCs, 1 unit plts and 2 units FFP 7/19 - Acute overcompensated met alkalosis (pH 7.87), DC Bicarb gtt, adjust vent settings         - Hgb improved 7.6 (s/p 1u PRBC, 4 FFP), K 3.0 / Mag 1.1  LINES / TUBES:  Left IJ TLC 7/16 >>  R radial arterial cath 7/16 >>  ETT 7/17 >>   CULTURES:  7/16 HIV Ab >>>NR  7/16 HCV Ab >>> POS  7/16 AFP >>> POS  ANTIBIOTICS:  Ceftriaxone (SBP px) 7/17 >>   SUBJECTIVE: No evidence of acute bleeding overnight. Per RN, stool now green/brown. Remains off pressor support. Per RT,  weaned yesterday, continue today. Patient following commands, on vent.  VITAL SIGNS: Temp:  [98.4 F (36.9 C)-99.3 F (37.4 C)] 98.6 F (37 C) (07/19 0555) Pulse Rate:  [80-103] 80 (07/19 1000) Resp:  [9-30] 30 (07/19 1000) BP: (90-162)/(34-93) 162/70 mmHg (07/19 0857) SpO2:  [96 %-100 %] 100 % (07/19 1000) Arterial Line BP: (94-165)/(29-79) 147/70 mmHg (07/19 1000) FiO2 (%):  [40 %] 40 % (07/19 0858) Weight:  [218 lb 7.6 oz (99.1 kg)] 218 lb 7.6 oz (99.1 kg) (07/19 0500) HEMODYNAMICS: CVP:  [10 mmHg-11 mmHg] 10 mmHg VENTILATOR SETTINGS: Vent Mode:  [-] PRVC FiO2 (%):  [40 %] 40 % Set Rate:  [14 bmp-30 bmp] 14 bmp Vt Set:  [600 mL-650 mL] 600 mL PEEP:  [5 cmH20] 5 cmH20 Plateau Pressure:  [23 cmH20-31 cmH20] 31 cmH20 INTAKE / OUTPUT: Intake/Output     07/18 0701 - 07/19 0700 07/19 0701 - 07/20 0700   I.V. (mL/kg) 5859.8 (59.1) 747.6 (7.5)   Blood 2756.1    NG/GT 20 20   IV Piggyback     Total Intake(mL/kg) 8635.9 (87.1) 767.6 (7.7)   Urine (mL/kg/hr) 1025 (0.4) 125 (0.3)   Stool 3 (0)    Blood     Total Output 1028 125   Net +7607.9 +642.6         PHYSICAL EXAMINATION: General: Ill-appearing, on vent  HEENT: NCAT, enucleated left eye  Cardiovascular: RRR, previously heard +1 flow murmur Respiratory: Some improved breath sounds, persistent bibasilar crackles  Abdomen: +BS, +easily reducible  umbilical hernia, soft  Extremities: No obvious deformities  Neuro: sedated, follows commands   LABS:  CBC  Recent Labs Lab 06/26/14 2011 06/27/14 0248 06/27/14 0754  WBC 11.9* 10.1 11.1*  HGB 6.7* 6.9* 7.6*  HCT 18.5* 19.2* 21.3*  PLT 74* 51* 47*   Coag's  Recent Labs Lab 06/24/14 1100 06/25/14 0300  06/25/14 2130 06/26/14 0509 06/26/14 2011 06/27/14 0248  APTT 33 36  --  46*  --   --   --   INR 1.87* 2.33*  < > 2.35* 2.59* 2.53* 1.98*  < > = values in this interval not displayed. BMET  Recent Labs Lab 06/25/14 2130 06/26/14 0509 06/27/14 0248  NA  145 145 147  K 4.4 4.8 3.0*  CL 107 105 100  CO2 11* 9* 24  BUN 26* 30* 42*  CREATININE 2.89* 3.50* 3.80*  GLUCOSE 122* 105* 214*   Electrolytes  Recent Labs Lab 06/25/14 0300  06/25/14 2130 06/26/14 0509 06/27/14 0248  CALCIUM 6.5*  < > 6.6* 6.5* 6.1*  MG 1.6  --   --   --  1.1*  PHOS 5.0*  --   --   --  4.2  < > = values in this interval not displayed. Sepsis Markers  Recent Labs Lab 06/25/14 1130 06/27/14 0257  LATICACIDVEN 6.9* 5.7*   ABG  Recent Labs Lab 06/25/14 1930 06/26/14 0857 06/27/14 1003  PHART 7.314* 7.359 7.875*  PCO2ART 24.4* 20.3* 18.3*  PO2ART 214.0* 134.0* 54.0*   Liver Enzymes  Recent Labs Lab 06/24/14 1100 06/25/14 2130 06/27/14 0248  AST 71* 2032* 2152*  ALT 20 372* 430*  ALKPHOS 85 65 79  BILITOT 0.4 2.3* 2.3*  ALBUMIN 1.4* 2.0* 2.4*   Cardiac Enzymes  Recent Labs Lab 06/25/14 1130  TROPONINI <0.30   Glucose  Recent Labs Lab 06/24/14 2030 06/25/14 1919  GLUCAP 130* 115*    Imaging Dg Chest Port 1 View  06/27/2014   CLINICAL DATA:  Re-intubation.  EXAM: PORTABLE CHEST - 1 VIEW  COMPARISON:  06/26/2014  FINDINGS: Endotracheal tube tip projects 4.6 cm above the carina.  Left internal jugular central venous line and nasogastric tube are stable.  Opacity has developed at the left lung base obscuring the hemidiaphragm. This may reflect atelectasis with pleural fluid. Pneumonia or other infiltrate is possible. Mild central vascular congestion is stable.  No pneumothorax.  No right effusion.  IMPRESSION: 1. Endotracheal tube tip projects 4.6 cm above the carina. 2. Other support apparatus is stable. 3. New opacity at the left lung base. This may reflect pneumonia. It could be due to atelectasis and pleural fluid.   Electronically Signed   By: Lajean Manes M.D.   On: 06/27/2014 07:37   Dg Chest Port 1v Same Day  06/26/2014   CLINICAL DATA:  Intubated patient.  EXAM: PORTABLE CHEST - 1 VIEW SAME DAY  COMPARISON:  06/25/2014.   FINDINGS: Mild persistent perihilar and medial lung base atelectasis. Lungs are otherwise clear.  Cardiac silhouette is normal in size.  Normal mediastinal contour.  Endotracheal tube is stable in well positioned.  New nasogastric tube passes below the diaphragm to curl within the stomach.  IMPRESSION: 1. New nasogastric tube is well positioned curling within the stomach. 2. No other change.  No acute findings in the lungs.   Electronically Signed   By: Lajean Manes M.D.   On: 06/26/2014 09:11     ASSESSMENT / PLAN:  PULMONARY A:  Acute respiratory failure - On  vent Worsening LLL opacity, likely atelectasis, ?PNA - on CXR 7/19 At risk pulmonary edema / TRALI  P:  Continue vent support, weaning Albuterol neb PRN Repeat CXR in AM  CARDIOVASCULAR A:  Hemorrhagic shock - Resolved (off pressors). Currently w/o active bleed today. Chronic HTN Diastolic CHF  Cardiac Arrest 7/17 in setting of active hemorrhage - Resolved / resuscitated P:  Off pressors (dc'd levo, vaso) Cont stress dose steroids  GASTROINTESTINAL A:  Acute lower GI bleed - secondary to rectal varices s/p OR w/ sutured varices d/t large output bleed (7/17)  Cirrhosis, alcoholic  Hepatitis C Ab positive - ? Old vs new diagnosis Multiple new hepatic lesions noted on CT Abdomen - unclear etiology, DDx includes regenerating nodules, metastatic disease, multifocal hepatocellular carcinoma.  In addition, note AFP tumor marker is positive > consistent with hepatocellular CA P:  Cont octreotide SUP: Protonix IV q 24 hr Future issue will be liver bx of lesions, ? hepatocellular CA GI following - Start nutrition with tube feeds. Recommend consider TIPS / embolization of rectal veins in future Surgery following - If re-bleed, recommend rectal tube balloon (inflated to tamponade)  RENAL A:  AoCKD, Stage 3, baseline cr 0.9-1.4.  Not presently a candidate for HD Severe combined resp/met alkalosis (over-compensated) - pH  7.87 Severe AG metabolic acidosis - Resolved Hypokalemia - 3.0 Hypomagnesemia - 1.1 Hypocalcemia P:  Discontinued Bicarb gtt, adjusted vent avoid excessive ventilation Repeat ABG @ 1500 Mag sulfate 2g IV x 1 K PO 40 mEq per tube BID today Replace electrolytes as indicated   HEMATOLOGIC A:  Acute blood loss anemia - Improved Hgb 7.6 (from 6.9), s/p 1u PRBC, 4u FFP Thrombocytopenia - Decreasing plt to 47 Coagulopathy - consumptive and hepatic - Improved INR to 1.98 P:  Goal hemoglobin > 8  Goal INR <1.5   Goal plts > 100k Transfuse products as needed to achieve above goals Cont Vitamin K  INFECTIOUS A:  SBP prophylaxis indicated in setting of variceal bleed  Hep C positive - new dx HIV NR  P:  Micro and abx as above Will need ID follow-up outpatient for possible HCV treatment  ENDOCRINE A:  At risk hypoglycemia P:   Monitor glucose on BMET  NEUROLOGIC A:  Alcohol abuse Chronic alcoholism  At risk alcohol withdrawal P:  Versed gtt/ fent gtt, goal RASS -1 Thiamine and folate Ativan prn   GLOBAL Close monitoring in ICU with recent active lower GIB, currently without evidence of continued bleed, monitor CBC q 6 hr, transfuse as necessary. Previously metabolic acidosis, over-compensated today to severe alkalosis, discontinued bicarb gtt, adjust vent, replace K / Mag. Consider eval for extubation 1-2 days.  CODE - Full  Nobie Putnam, [place], PGY-2   PCCM ATTENDING: I have interviewed and examined the patient and reviewed the database. I have formulated the assessment and plan as reflected in the note above with amendments made by me. 40 mins of direct critical care time provided  Merton Border, MD;  PCCM service; [place] [phone]  06/27/2014, 10:40 AM

## 2014-06-27 NOTE — Progress Notes (Signed)
eLink Physician-Brief Progress Note Patient Name: Edwin Love DOB: Jun 02, 1961 MRN: [id number]  Date of Service  06/27/2014   HPI/Events of Note   Persistent metabolic alkalosis noted.  eICU Interventions   Decrease RR to 10. Repeat ABG at midnight.    Intervention Category Major Interventions: Acid-Base disturbance - evaluation and management  Nolyn Swab R. 06/27/2014, 8:12 PM

## 2014-06-27 NOTE — Progress Notes (Signed)
Progress Note   Subjective  *Having green stools.  Intubated**   Objective  Vital signs in last 24 hours: Temp:  [98.4 F (36.9 C)-99.3 F (37.4 C)] 98.6 F (37 C) (07/19 0555) Pulse Rate:  [85-103] 93 (07/19 0857) Resp:  [9-30] 30 (07/19 0857) BP: (90-162)/(34-93) 162/70 mmHg (07/19 0857) SpO2:  [96 %-100 %] 100 % (07/19 0857) Arterial Line BP: (94-165)/(29-79) 145/70 mmHg (07/19 0800) FiO2 (%):  [40 %] 40 % (07/19 0858) Weight:  [218 lb 7.6 oz (99.1 kg)] 218 lb 7.6 oz (99.1 kg) (07/19 0500) Last BM Date: 06/26/14 General:    Well-developed,   in NAD Heart:  Regular rate and rhythm; no murmurs Abdomen:  Soft, nontender and nondistended. Decreased bowel sounds, without guarding, and without rebound.     Intake/Output from previous day: 07/18 0701 - 07/19 0700 In: 8635.9 [I.V.:5859.8; Blood:2756.1; NG/GT:20] Out: 1028 [Urine:1025; Stool:3] Intake/Output this shift: Total I/O In: 266.5 [I.V.:246.5; NG/GT:20] Out: 125 [Urine:125]  Lab Results:  Recent Labs  06/26/14 2011 06/27/14 0248 06/27/14 0754  WBC 11.9* 10.1 11.1*  HGB 6.7* 6.9* 7.6*  HCT 18.5* 19.2* 21.3*  PLT 74* 51* 47*   BMET  Recent Labs  06/25/14 2130 06/26/14 0509 06/27/14 0248  NA 145 145 147  K 4.4 4.8 3.0*  CL 107 105 100  CO2 11* 9* 24  GLUCOSE 122* 105* 214*  BUN 26* 30* 42*  CREATININE 2.89* 3.50* 3.80*  CALCIUM 6.6* 6.5* 6.1*   LFT  Recent Labs  06/27/14 0248  PROT 4.7*  ALBUMIN 2.4*  AST 2152*  ALT 430*  ALKPHOS 79  BILITOT 2.3*   PT/INR  Recent Labs  06/26/14 2011 06/27/14 0248  LABPROT 27.3* 22.5*  INR 2.53* 1.98*   Hepatitis Panel  Recent Labs  06/24/14 1340 06/25/14 2130  HEPBSAG NEGATIVE  --   HCVAB Reactive* Reactive*    Studies/Results: Dg Chest Port 1 View  06/27/2014   CLINICAL DATA:  Re-intubation.  EXAM: PORTABLE CHEST - 1 VIEW  COMPARISON:  06/26/2014  FINDINGS: Endotracheal tube tip projects 4.6 cm above the carina.  Left internal  jugular central venous line and nasogastric tube are stable.  Opacity has developed at the left lung base obscuring the hemidiaphragm. This may reflect atelectasis with pleural fluid. Pneumonia or other infiltrate is possible. Mild central vascular congestion is stable.  No pneumothorax.  No right effusion.  IMPRESSION: 1. Endotracheal tube tip projects 4.6 cm above the carina. 2. Other support apparatus is stable. 3. New opacity at the left lung base. This may reflect pneumonia. It could be due to atelectasis and pleural fluid.   Electronically Signed   By: Lajean Manes M.D.   On: 06/27/2014 07:37   Dg Chest Port 1v Same Day  06/26/2014   CLINICAL DATA:  Intubated patient.  EXAM: PORTABLE CHEST - 1 VIEW SAME DAY  COMPARISON:  06/25/2014.  FINDINGS: Mild persistent perihilar and medial lung base atelectasis. Lungs are otherwise clear.  Cardiac silhouette is normal in size.  Normal mediastinal contour.  Endotracheal tube is stable in well positioned.  New nasogastric tube passes below the diaphragm to curl within the stomach.  IMPRESSION: 1. New nasogastric tube is well positioned curling within the stomach. 2. No other change.  No acute findings in the lungs.   Electronically Signed   By: Lajean Manes M.D.   On: 06/26/2014 09:11   Dg Abd Portable 1v  06/25/2014   CLINICAL DATA:  53 year old male undergoing enteric  tube placement.  EXAM: PORTABLE ABDOMEN - 1 VIEW  COMPARISON:  Portable chest radiograph 0309 hr the same day and earlier.  FINDINGS: Two portable AP views of the abdomen at 0959 hrs. Enteric tube projects over the proximal stomach, side hole at the level of the gastric fundus.  Increased dilated gas-filled small bowel loops, measuring up to 39 mm diameter. Similar volume of large bowel gas compared to recent CT Abdomen and Pelvis. Excreted IV contrast in the bladder. No definite pneumoperitoneum. No acute osseous abnormality identified.  IMPRESSION: 1. Enteric tube placed, side hole at the level  of the gastric fundus. 2. New small bowel obstruction gas pattern.  No definite free air.   Electronically Signed   By: Lars Pinks M.D.   On: 06/25/2014 10:44      Assessment & Plan  *1.  *Lower GI bleeding secondary to rectal varices-status post suture ligation. No further active bleeding since procedure. Should he rebleed would immediately insert a barium enema balloon catheter and inflate. In the long run would consider for TIPS placement and embolization of rectal veins if his hepatic function improves.  At present he is not a candidate for TIPS or shunt. 2. Cirrhosis -  INR is improved 3..nutrition - OK to begin TF * Active Problems:   CKD (chronic kidney disease) stage 3, GFR 30-59 ml/min   Acute posthemorrhagic anemia   Alcoholic cirrhosis   Esophageal varices without bleeding   GI bleed   Lower GI bleed   Rectal varices   Hemorrhagic shock   Acute respiratory failure   Acute kidney injury     LOS: 3 days   Erskine Emery  06/27/2014, 10:07 AM

## 2014-06-28 ENCOUNTER — Inpatient Hospital Stay (HOSPITAL_COMMUNITY): Payer: Medicaid Other

## 2014-06-28 ENCOUNTER — Encounter (HOSPITAL_COMMUNITY): Payer: Self-pay | Admitting: Gastroenterology

## 2014-06-28 LAB — TYPE AND SCREEN
ABO/RH(D): B POS
Antibody Screen: NEGATIVE
UNIT DIVISION: 0
UNIT DIVISION: 0
UNIT DIVISION: 0
UNIT DIVISION: 0
UNIT DIVISION: 0
UNIT DIVISION: 0
UNIT DIVISION: 0
UNIT DIVISION: 0
Unit division: 0
Unit division: 0
Unit division: 0
Unit division: 0
Unit division: 0
Unit division: 0
Unit division: 0
Unit division: 0
Unit division: 0

## 2014-06-28 LAB — GLUCOSE, CAPILLARY
GLUCOSE-CAPILLARY: 120 mg/dL — AB (ref 70–99)
Glucose-Capillary: 105 mg/dL — ABNORMAL HIGH (ref 70–99)
Glucose-Capillary: 125 mg/dL — ABNORMAL HIGH (ref 70–99)
Glucose-Capillary: 134 mg/dL — ABNORMAL HIGH (ref 70–99)
Glucose-Capillary: 136 mg/dL — ABNORMAL HIGH (ref 70–99)
Glucose-Capillary: 151 mg/dL — ABNORMAL HIGH (ref 70–99)
Glucose-Capillary: 175 mg/dL — ABNORMAL HIGH (ref 70–99)
Glucose-Capillary: 97 mg/dL (ref 70–99)

## 2014-06-28 LAB — COMPREHENSIVE METABOLIC PANEL
ALBUMIN: 2.3 g/dL — AB (ref 3.5–5.2)
ALT: 397 U/L — ABNORMAL HIGH (ref 0–53)
ANION GAP: 13 (ref 5–15)
AST: 1057 U/L — ABNORMAL HIGH (ref 0–37)
Alkaline Phosphatase: 100 U/L (ref 39–117)
BUN: 52 mg/dL — AB (ref 6–23)
CALCIUM: 6 mg/dL — AB (ref 8.4–10.5)
CO2: 32 mEq/L (ref 19–32)
CREATININE: 3.43 mg/dL — AB (ref 0.50–1.35)
Chloride: 103 mEq/L (ref 96–112)
GFR calc Af Amer: 22 mL/min — ABNORMAL LOW (ref >90)
GFR calc non Af Amer: 19 mL/min — ABNORMAL LOW (ref >90)
Glucose, Bld: 108 mg/dL — ABNORMAL HIGH (ref 70–99)
Potassium: 3.7 mEq/L (ref 3.7–5.3)
Sodium: 148 mEq/L — ABNORMAL HIGH (ref 137–147)
TOTAL PROTEIN: 5.4 g/dL — AB (ref 6.0–8.3)
Total Bilirubin: 1.9 mg/dL — ABNORMAL HIGH (ref 0.3–1.2)

## 2014-06-28 LAB — CBC
HEMATOCRIT: 25.5 % — AB (ref 39.0–52.0)
Hemoglobin: 8.7 g/dL — ABNORMAL LOW (ref 13.0–17.0)
MCH: 29.2 pg (ref 26.0–34.0)
MCHC: 34.1 g/dL (ref 30.0–36.0)
MCV: 85.6 fL (ref 78.0–100.0)
Platelets: 49 10*3/uL — ABNORMAL LOW (ref 150–400)
RBC: 2.98 MIL/uL — ABNORMAL LOW (ref 4.22–5.81)
RDW: 16.2 % — ABNORMAL HIGH (ref 11.5–15.5)
WBC: 15.6 10*3/uL — ABNORMAL HIGH (ref 4.0–10.5)

## 2014-06-28 LAB — PREPARE FRESH FROZEN PLASMA
UNIT DIVISION: 0
UNIT DIVISION: 0
Unit division: 0
Unit division: 0

## 2014-06-28 LAB — POCT I-STAT 3, ART BLOOD GAS (G3+)
Acid-Base Excess: 13 mmol/L — ABNORMAL HIGH (ref 0.0–2.0)
BICARBONATE: 36.8 meq/L — AB (ref 20.0–24.0)
O2 SAT: 99 %
PO2 ART: 119 mmHg — AB (ref 80.0–100.0)
Patient temperature: 98.5
TCO2: 38 mmol/L (ref 0–100)
pCO2 arterial: 45.4 mmHg — ABNORMAL HIGH (ref 35.0–45.0)
pH, Arterial: 7.516 — ABNORMAL HIGH (ref 7.350–7.450)

## 2014-06-28 LAB — PROTIME-INR
INR: 1.62 — AB (ref 0.00–1.49)
Prothrombin Time: 19.2 seconds — ABNORMAL HIGH (ref 11.6–15.2)

## 2014-06-28 LAB — LACTIC ACID, PLASMA: LACTIC ACID, VENOUS: 1.4 mmol/L (ref 0.5–2.2)

## 2014-06-28 LAB — APTT: APTT: 32 s (ref 24–37)

## 2014-06-28 MED ORDER — DEXTROSE 5 % IV SOLN
1.0000 g | INTRAVENOUS | Status: AC
  Administered 2014-06-29 – 2014-07-01 (×3): 1 g via INTRAVENOUS
  Filled 2014-06-28 (×4): qty 10

## 2014-06-28 MED ORDER — FENTANYL CITRATE 0.05 MG/ML IJ SOLN
100.0000 ug | INTRAMUSCULAR | Status: DC | PRN
  Administered 2014-06-28 – 2014-06-29 (×4): 100 ug via INTRAVENOUS
  Filled 2014-06-28 (×4): qty 2

## 2014-06-28 MED ORDER — VITAL HIGH PROTEIN PO LIQD
1000.0000 mL | ORAL | Status: DC
  Filled 2014-06-28 (×2): qty 1000

## 2014-06-28 MED ORDER — LACTULOSE 10 GM/15ML PO SOLN
35.0000 g | Freq: Three times a day (TID) | ORAL | Status: DC
  Administered 2014-06-28 – 2014-06-29 (×6): 35 g via ORAL
  Filled 2014-06-28 (×9): qty 60

## 2014-06-28 MED ORDER — FUROSEMIDE 10 MG/ML IJ SOLN
60.0000 mg | Freq: Two times a day (BID) | INTRAMUSCULAR | Status: AC
  Administered 2014-06-28 – 2014-06-29 (×4): 60 mg via INTRAVENOUS
  Filled 2014-06-28 (×4): qty 6

## 2014-06-28 MED ORDER — HYDRALAZINE HCL 20 MG/ML IJ SOLN
10.0000 mg | INTRAMUSCULAR | Status: DC | PRN
  Administered 2014-06-29: 20 mg via INTRAVENOUS
  Administered 2014-07-02: 10 mg via INTRAVENOUS
  Filled 2014-06-28: qty 2
  Filled 2014-06-28: qty 1

## 2014-06-28 MED ORDER — VITAL AF 1.2 CAL PO LIQD
1000.0000 mL | ORAL | Status: DC
  Administered 2014-06-28: 1000 mL
  Filled 2014-06-28 (×7): qty 1000

## 2014-06-28 NOTE — Progress Notes (Signed)
NUTRITION FOLLOW UP  Intervention:    Continue 6M PEPuP Protocol with TF via OGT: change TF formula to Vital AF 1.2 at 65 ml/h (1560 ml per day) to provide 1872 kcals, 117 gm protein, 1265 ml free water daily.  Nutrition Dx:   Inadequate oral intake related to inability to eat as evidenced by NPO status, ongoing.  Goal:   Intake to meet >90% of estimated nutrition needs.  Monitor:   TF tolerance/adequacy, weight trend, labs, vent status.  Assessment:   53 y.o. M with cirrhosis and esophageal varices brought to ED after being found lying in pool of blood by family. Underwent EGD 7/16 which was negative for UGIB; however, pt continued to have massive hematochezia. Bleeding scan positive for LGIB in sigmoid colon.  S/P colonoscopy with control of bleeding on 07/21/2023. Coded during procedure. Colonoscopy demonstrated an actively bleeding varix in the rectal vault. S/P oversew of rectal varices on 21-Jul-2023.  TF was initiated on 7/19 per adult tube feeding protocol; currently receiving Vital High Protein at 40 ml/h with Prostat 30 ml BID providing 1160 kcals, 114 gm protein, 803 ml free water daily. Tolerating TF well with minimal residuals 5-95 ml.   Patient is currently intubated on ventilator support MV: 8.1 L/min Temp (24hrs), Avg:98 F (36.7 C), Min:97.6 F (36.4 C), Max:98.5 F (36.9 C)  Propofol: none   Height: Ht Readings from Last 1 Encounters:  06/24/14 5\' 9"  (1.753 m)    Weight Status:  Increased with positive fluid status Wt Readings from Last 1 Encounters:  06/27/14 218 lb 7.6 oz (99.1 kg)  07/20/14  187 lb 6.3 oz (85 kg) BMI=27.7  Re-estimated needs:  Kcal: 1825 Protein: 115-130 gm Fluid: 2 L  Skin: right groin incision  Diet Order: NPO   Intake/Output Summary (Last 24 hours) at 06/28/14 1108 Last data filed at 06/28/14 1000  Gross per 24 hour  Intake 2924.29 ml  Output   2110 ml  Net 814.29 ml    Last BM: 7/19   Labs:   Recent Labs Lab  06/24/14 1114 07/20/2014 0300  06/26/14 0509 06/27/14 0248 06/28/14 0500  NA 143 146  < > 145 147 148*  K 4.1 5.4*  < > 4.8 3.0* 3.7  CL 114* 116*  < > 105 100 103  CO2  --  8*  < > 9* 24 32  BUN 14 21  < > 30* 42* 52*  CREATININE 1.90* 1.97*  < > 3.50* 3.80* 3.43*  CALCIUM  --  6.5*  < > 6.5* 6.1* 6.0*  MG  --  1.6  --   --  1.1*  --   PHOS  --  5.0*  --   --  4.2  --   GLUCOSE 124* 153*  < > 105* 214* 108*  < > = values in this interval not displayed.  CBG (last 3)   Recent Labs  06/27/14 2336 06/28/14 0339 06/28/14 0735  GLUCAP 136* 97 120*    Scheduled Meds: . antiseptic oral rinse  15 mL Mouth Rinse q12n4p  . [START ON 06/29/2014] cefTRIAXone (ROCEPHIN)  IV  1 g Intravenous Q24H  . chlorhexidine  15 mL Mouth Rinse BID  . feeding supplement (PRO-STAT SUGAR FREE 64)  30 mL Per Tube BID  . folic acid  1 mg Intravenous Daily  . furosemide  60 mg Intravenous Q12H  . insulin aspart  0-9 Units Subcutaneous 6 times per day  . lactulose  35 g Oral TID  .  pantoprazole (PROTONIX) IV  40 mg Intravenous Q24H  . sodium chloride  3 mL Intravenous Q12H  . thiamine  100 mg Intravenous Daily    Continuous Infusions: . dextrose 50 mL/hr at 06/27/14 1036  . feeding supplement (VITAL HIGH PROTEIN) 1,000 mL (06/28/14 0730)  . fentaNYL infusion INTRAVENOUS Stopped (06/28/14 0847)    Molli Barrows, RD, LDN, [place] Pager [phone] After Hours Pager [phone]

## 2014-06-28 NOTE — Progress Notes (Signed)
PULMONARY / CRITICAL CARE MEDICINE   Name: Edwin Love MRN: [id number] DOB: 06-30-1961    ADMISSION DATE: 06/24/2014  CONSULTATION DATE: 06/24/2014   REFERRING MD : [name]  PRIMARY SERVICE: PCCM   CHIEF COMPLAINT: Lower GI Hemorrhage   BRIEF PATIENT DESCRIPTION:  53 y.o. M with cirrhosis and esophageal varices brought to ED after being found lying in pool of blood by family. Underwent EGD 7/16 which was negative for UGIB; however, pt continued to have massive hematochezia. Bleeding scan positive for LGIB in sigmoid colon. PCCM consulted and pt admitted to ICU.   SIGNIFICANT EVENTS / STUDIES:  7/16 Abd CT >>> cirrhosis, multiple new hepatic lesions are noted, partial thrombosis of portal vein, portal HTN with multiple varices, periportal adenopathy cannot be excluded, umbilical hernia with herniation of small bowel, extensive small bowel wall edema. 7/16 EGD >>> varices present; however, no acute bleeding.  7/16 Bleeding Scan >>> positive for acute lower GI bleed with source likely within the sigmoid colon.  7/17 Continued brisk BRBPR, shock, altered MS 7/17 Intubated 7/17 Angiogram of IMA, SMA and left gastric artery -No acute arterial bleeding source identified 7/17 Colonscopy w/ active rectal bleeding/cardiac arrest w/ PEA/CPR  7/17 >To OR large rectal varices bleed>oversewn  7/18 Remains intubated. Worsening met acidosis. HCO3 infusion increased. Received 1 unit PRBCs, 1 unit plts and 2 units FFP 7/19 - Acute overcompensated met alkalosis (pH 7.87), DC Bicarb gtt, adjust vent settings         - Hgb improved 7.6 (s/p 1u PRBC, 4 FFP), K 3.0 / Mag 1.1 7/20 - improved metabolic alkalosis off bicarb  LINES / TUBES:  Left IJ TLC 7/16 >>  R radial arterial cath 7/16 >>  ETT 7/17 >>   CULTURES:  7/16 HIV Ab >>>NR  7/16 HCV Ab >>> POS  7/16 AFP >>> POS  ANTIBIOTICS:  Ceftriaxone (SBP px) 7/17 >>   SUBJECTIVE: Awakens to voice but not responding to questions or commands  this am. No further bleeding overnight.  VITAL SIGNS: Temp:  [97.6 F (36.4 C)-98.2 F (36.8 C)] 98.2 F (36.8 C) (07/20 0421) Pulse Rate:  [72-93] 85 (07/20 0727) Resp:  [1-30] 18 (07/20 0727) BP: (140-162)/(70-86) 150/81 mmHg (07/20 0727) SpO2:  [100 %] 100 % (07/20 0727) Arterial Line BP: (103-171)/(66-96) 137/77 mmHg (07/20 0600) FiO2 (%):  [40 %] 40 % (07/20 0728) HEMODYNAMICS:   VENTILATOR SETTINGS: Vent Mode:  [-] PRVC FiO2 (%):  [40 %] 40 % Set Rate:  [10 bmp-30 bmp] 10 bmp Vt Set:  [450 mL-650 mL] 450 mL PEEP:  [5 cmH20] 5 cmH20 Plateau Pressure:  [12 WUJ81-19 cmH20] 12 cmH20 INTAKE / OUTPUT: Intake/Output     07/19 0701 - 07/20 0700 07/20 0701 - 07/21 0700   I.V. (mL/kg) 2616.2 (26.4)    Blood     NG/GT 607.5    IV Piggyback 100    Total Intake(mL/kg) 3323.7 (33.5)    Urine (mL/kg/hr) 1795 (0.8)    Stool     Total Output 1795     Net +1528.7           PHYSICAL EXAMINATION: General: Ill-appearing, on vent  HEENT: NCAT, enucleated left eye  Cardiovascular: RRR, previously heard +1 flow murmur Respiratory: Some improved breath sounds, persistent bibasilar crackles  Abdomen: +BS, +easily reducible umbilical hernia, soft  Extremities: No obvious deformities  Neuro: sedated, follows commands   LABS:  CBC  Recent Labs Lab 06/27/14 1420 06/27/14 2035 06/28/14 0500  WBC 13.5* 12.5*  15.6*  HGB 8.0* 8.8* 8.7*  HCT 22.7* 25.7* 25.5*  PLT 53* 46* 49*   Coag's  Recent Labs Lab 06/25/14 0300  06/25/14 2130  06/26/14 2011 06/27/14 0248 06/28/14 0500  APTT 36  --  46*  --   --   --  32  INR 2.33*  < > 2.35*  < > 2.53* 1.98* 1.62*  < > = values in this interval not displayed. BMET  Recent Labs Lab 06/26/14 0509 06/27/14 0248 06/28/14 0500  NA 145 147 148*  K 4.8 3.0* 3.7  CL 105 100 103  CO2 9* 24 32  BUN 30* 42* 52*  CREATININE 3.50* 3.80* 3.43*  GLUCOSE 105* 214* 108*   Electrolytes  Recent Labs Lab 06/25/14 0300  06/26/14 0509  06/27/14 0248 06/28/14 0500  CALCIUM 6.5*  < > 6.5* 6.1* 6.0*  MG 1.6  --   --  1.1*  --   PHOS 5.0*  --   --  4.2  --   < > = values in this interval not displayed. Sepsis Markers  Recent Labs Lab 06/25/14 1130 06/27/14 0257 06/28/14 0500  LATICACIDVEN 6.9* 5.7* 1.4   ABG  Recent Labs Lab 06/27/14 1511 06/27/14 1828 06/27/14 2055  PHART 7.595* 7.528* 7.534*  PCO2ART 39.7 39.1 43.6  PO2ART 120.0* 149.0* 125.0*   Liver Enzymes  Recent Labs Lab 06/25/14 2130 06/27/14 0248 06/28/14 0500  AST 2032* 2152* 1057*  ALT 372* 430* 397*  ALKPHOS 65 79 100  BILITOT 2.3* 2.3* 1.9*  ALBUMIN 2.0* 2.4* 2.3*   Cardiac Enzymes  Recent Labs Lab 06/25/14 1130  TROPONINI <0.30   Glucose  Recent Labs Lab 06/25/14 1919 06/27/14 1103 06/27/14 1519 06/27/14 1907 06/27/14 2336 06/28/14 0339  GLUCAP 115* 145* 130* 139* 136* 97    Imaging Dg Chest Port 1 View  06/28/2014   CLINICAL DATA:  Respiratory failure.  EXAM: PORTABLE CHEST - 1 VIEW  COMPARISON:  06/27/2014.  FINDINGS: Endotracheal tube and NG tube in good anatomic position. Cardiomegaly with pulmonary vascular congestion and bilateral interstitial infiltrate is present. Left pleural effusion present. Findings consistent with congestive heart failure. No pneumothorax. No acute osseous abnormality .  IMPRESSION: 1. Line and tube positions stable. 2. Congestive heart failure with pulmonary edema.   Electronically Signed   By: [place]   On: 06/28/2014 07:29   Dg Chest Port 1 View  06/27/2014   CLINICAL DATA:  Re-intubation.  EXAM: PORTABLE CHEST - 1 VIEW  COMPARISON:  06/26/2014  FINDINGS: Endotracheal tube tip projects 4.6 cm above the carina.  Left internal jugular central venous line and nasogastric tube are stable.  Opacity has developed at the left lung base obscuring the hemidiaphragm. This may reflect atelectasis with pleural fluid. Pneumonia or other infiltrate is possible. Mild central vascular congestion is  stable.  No pneumothorax.  No right effusion.  IMPRESSION: 1. Endotracheal tube tip projects 4.6 cm above the carina. 2. Other support apparatus is stable. 3. New opacity at the left lung base. This may reflect pneumonia. It could be due to atelectasis and pleural fluid.   Electronically Signed   By: Lajean Manes M.D.   On: 06/27/2014 07:37   Dg Chest Port 1v Same Day  06/26/2014   CLINICAL DATA:  Intubated patient.  EXAM: PORTABLE CHEST - 1 VIEW SAME DAY  COMPARISON:  06/25/2014.  FINDINGS: Mild persistent perihilar and medial lung base atelectasis. Lungs are otherwise clear.  Cardiac silhouette is normal in size.  Normal mediastinal contour.  Endotracheal tube is stable in well positioned.  New nasogastric tube passes below the diaphragm to curl within the stomach.  IMPRESSION: 1. New nasogastric tube is well positioned curling within the stomach. 2. No other change.  No acute findings in the lungs.   Electronically Signed   By: Lajean Manes M.D.   On: 06/26/2014 09:11     ASSESSMENT / PLAN:  PULMONARY A:  Acute respiratory failure - On vent Worsening LLL opacity, likely atelectasis, ?PNA At risk pulmonary edema / TRALI  P:  On min settings, wean ps 5, cpap 5, has some apnea related to alkaosis (no further bicarb), we are on min MV If he breaths over set rate will change to SIMV with NO help on additional breaths, abg to follow Albuterol neb PRN Repeat CXR in AM for volume status Consider lasix, cvp to follow  CARDIOVASCULAR A:  Hemorrhagic shock - Resolved (off pressors). Currently w/o active bleeding Chronic HTN Diastolic CHF  Cardiac Arrest 7/17 in setting of active hemorrhage - Resolved / resuscitated P:  Off pressors (dc'd levo, vaso) Cvp, lasix  GASTROINTESTINAL A:  Acute lower GI bleed - secondary to rectal varices s/p OR w/ sutured varices d/t large output bleed (7/17)  Cirrhosis, alcoholic  Hepatitis C Ab positive - ? Old vs new diagnosis Multiple new hepatic lesions  noted on CT Abdomen - unclear etiology, DDx includes regenerating nodules, metastatic disease, multifocal hepatocellular carcinoma.  In addition, note AFP tumor marker is positive > consistent with hepatocellular CA shock liver P:  Consider to wean octreotide to off SUP: Protonix IV q 24 hr Pep UP feeding Surgery following - If re-bleed, recommend rectal tube balloon (inflated to tamponade) Will re call IR to discuss tips in future LFT in am   RENAL A:  AoCKD, Stage 3, baseline cr 0.9-1.4.  Not presently a candidate for HD Combined resp/met alkalosis (over-compensated) - pH 7.53 Severe AG metabolic acidosis - Resolved Hypokalemia - 3.7 Hypomagnesemia - 1.1 Hypocalcemia corrects to wnl P:  Lasix Chem in am  d5w remain  HEMATOLOGIC A:  Acute blood loss anemia - Improved Hgb 8.7 Thrombocytopenia - Stable ~50 Coagulopathy - consumptive and hepatic - Improved INR to 1.62 P:  Goal hemoglobin > 7 Goal INR < 1.5   No role plat transfusions unless bleeding noted Transfuse products as needed to achieve above goals Repeat coags in am   INFECTIOUS A:  SBP prophylaxis indicated in setting of variceal bleed  Hep C positive - new dx HIV NR  P:  Micro and abx as above Will need ID follow-up outpatient for possible HCV treatment,will need genotype and load Stop date 1 week ceftriaxone  ENDOCRINE A:  At risk hypoglycemia, octreotide risk for elevated P:   cbg d5w  NEUROLOGIC A:  Alcohol abuse Chronic alcoholism  At risk alcohol withdrawal Hepatic encephalaopthy P:   Versed avoid further Thiamine and folate Start lactulose If needed add precedex  GLOBAL To simv for alk and apnea, lactulose addition, lower Transfusion goals  CODE - Full  Beverlyn Roux, MD, MPH [place] PGY-2 06/28/2014 8:04 AM  Ccm time 30 min   I have fully examined this patient and agree with above findings.    And edite dinfull  Lavon Paganini. Titus Mould, MD, [place] Pgr: [place]  [facility]

## 2014-06-28 NOTE — Progress Notes (Signed)
Fentynl/Versed dosed unneeded/unused...returned to pharm per EARL, Pharm.

## 2014-06-28 NOTE — Progress Notes (Signed)
Progress Note   Subjective   Still sedated.  No further bloody BMs per nurse.  Does not follow commands, but eye opening to voice.  Going to begin dialyzing the pt today per nurse.     Objective   Vital signs in last 24 hours: Temp:  [97.6 F (36.4 C)-98.5 F (36.9 C)] 98.5 F (36.9 C) (07/20 0828) Pulse Rate:  [72-92] 92 (07/20 0900) Resp:  [1-23] 14 (07/20 0900) BP: (140-154)/(76-86) 150/83 mmHg (07/20 0830) SpO2:  [100 %] 100 % (07/20 0900) Arterial Line BP: (103-171)/(73-96) 161/90 mmHg (07/20 0900) FiO2 (%):  [40 %] 40 % (07/20 0845) Last BM Date: 06/27/14 (19th a.m)  General:    AA male, sedated. Heart:  RRR; no M/G/R appreciated Lungs: Respirations even and unlabored, bibasilar crackles. Abdomen:  Soft, nontender and nondistended. Normal bowel sounds. Reducible umbilical hernia Extremities:  Without edema. Neurologic:  Sedated.  Eye opening to voice. Does not follow commands. Psych:  Sedated.  Intake/Output from previous day: 07/19 0701 - 07/20 0700 In: 3323.7 [I.V.:2616.2; NG/GT:607.5; IV Piggyback:100] Out: 8341 [DQQIW:9798] Intake/Output this shift: Total I/O In: 374.3 [I.V.:201.8; NG/GT:172.5] Out: 580 [Urine:580]  Lab Results:  Recent Labs  06/27/14 1420 06/27/14 2035 06/28/14 0500  WBC 13.5* 12.5* 15.6*  HGB 8.0* 8.8* 8.7*  HCT 22.7* 25.7* 25.5*  PLT 53* 46* 49*   BMET  Recent Labs  06/26/14 0509 06/27/14 0248 06/28/14 0500  NA 145 147 148*  K 4.8 3.0* 3.7  CL 105 100 103  CO2 9* 24 32  GLUCOSE 105* 214* 108*  BUN 30* 42* 52*  CREATININE 3.50* 3.80* 3.43*  CALCIUM 6.5* 6.1* 6.0*   LFT  Recent Labs  06/28/14 0500  PROT 5.4*  ALBUMIN 2.3*  AST 1057*  ALT 397*  ALKPHOS 100  BILITOT 1.9*   PT/INR  Recent Labs  06/27/14 0248 06/28/14 0500  LABPROT 22.5* 19.2*  INR 1.98* 1.62*    Studies/Results: Dg Chest Port 1 View  06/28/2014   CLINICAL DATA:  Respiratory failure.  EXAM: PORTABLE CHEST - 1 VIEW  COMPARISON:   06/27/2014.  FINDINGS: Endotracheal tube and NG tube in good anatomic position. Cardiomegaly with pulmonary vascular congestion and bilateral interstitial infiltrate is present. Left pleural effusion present. Findings consistent with congestive heart failure. No pneumothorax. No acute osseous abnormality .  IMPRESSION: 1. Line and tube positions stable. 2. Congestive heart failure with pulmonary edema.   Electronically Signed   By: [place]   On: 06/28/2014 07:29   Dg Chest Port 1 View  06/27/2014   CLINICAL DATA:  Re-intubation.  EXAM: PORTABLE CHEST - 1 VIEW  COMPARISON:  06/26/2014  FINDINGS: Endotracheal tube tip projects 4.6 cm above the carina.  Left internal jugular central venous line and nasogastric tube are stable.  Opacity has developed at the left lung base obscuring the hemidiaphragm. This may reflect atelectasis with pleural fluid. Pneumonia or other infiltrate is possible. Mild central vascular congestion is stable.  No pneumothorax.  No right effusion.  IMPRESSION: 1. Endotracheal tube tip projects 4.6 cm above the carina. 2. Other support apparatus is stable. 3. New opacity at the left lung base. This may reflect pneumonia. It could be due to atelectasis and pleural fluid.   Electronically Signed   By: Lajean Manes M.D.   On: 06/27/2014 07:37      Assessment / Plan:   - Rectal Varices - s/p ligation. Surgical following if needed for any continued, recurrent bleeding. Pt is sedated  but stable at this point. Hgb still low and in the 8s. Seems to be trending up. - Alcoholic Cirrhosis - currently not stable enough for TIPS procedure but to be considered once stable - Acute Kidney injury - Cr currently at 3.4 from baseline of 0.9-1.4. GFR 19. Being managed by Critical Care Team - Acute Respiratory failure - currently intubated.  Likely to continue today.  Being managed by Critical Care Team.   LOS: 4 days   Lollie Marrow  06/28/2014, 10:17 AM      Attending physician's  note   I have taken an interval history, reviewed the chart and examined the patient. I agree with the Advanced Practitioner's note, impression and recommendations. No recurrent GI bleeding S/P surgical ligation of rectal varices on 7/18. Monitor Hb for now. INR has slightly improved to 1.62.  Pricilla Riffle. Fuller Plan, MD [facility]

## 2014-06-28 NOTE — Progress Notes (Signed)
3 Days Post-Op  Subjective: Pt con't with no bloody BMs x24hrs Off pressors  Objective: Vital signs in last 24 hours: Temp:  [97.6 F (36.4 C)-98.2 F (36.8 C)] 98.2 F (36.8 C) (07/20 0421) Pulse Rate:  [72-93] 85 (07/20 0727) Resp:  [1-30] 18 (07/20 0727) BP: (121-162)/(70-86) 150/81 mmHg (07/20 0727) SpO2:  [99 %-100 %] 100 % (07/20 0727) Arterial Line BP: (103-171)/(66-96) 137/77 mmHg (07/20 0600) FiO2 (%):  [40 %] 40 % (07/20 0728) Last BM Date: 06/27/14 (19th a.m)  Intake/Output from previous day: 07/19 0701 - 07/20 0700 In: 3323.7 [I.V.:2616.2; NG/GT:607.5; IV Piggyback:100] Out: 1795 [CHENI:7782] Intake/Output this shift:    General appearance: sedated GI: soft, non-tender; bowel sounds normal; no masses,  no organomegaly  Lab Results:   Recent Labs  06/27/14 2035 06/28/14 0500  WBC 12.5* 15.6*  HGB 8.8* 8.7*  HCT 25.7* 25.5*  PLT 46* 49*   BMET  Recent Labs  06/27/14 0248 06/28/14 0500  NA 147 148*  K 3.0* 3.7  CL 100 103  CO2 24 32  GLUCOSE 214* 108*  BUN 42* 52*  CREATININE 3.80* 3.43*  CALCIUM 6.1* 6.0*   PT/INR  Recent Labs  06/27/14 0248 06/28/14 0500  LABPROT 22.5* 19.2*  INR 1.98* 1.62*   ABG  Recent Labs  06/27/14 1828 06/27/14 2055  PHART 7.528* 7.534*  HCO3 32.5* 36.8*    Studies/Results: Dg Chest Port 1 View  06/28/2014   CLINICAL DATA:  Respiratory failure.  EXAM: PORTABLE CHEST - 1 VIEW  COMPARISON:  06/27/2014.  FINDINGS: Endotracheal tube and NG tube in good anatomic position. Cardiomegaly with pulmonary vascular congestion and bilateral interstitial infiltrate is present. Left pleural effusion present. Findings consistent with congestive heart failure. No pneumothorax. No acute osseous abnormality .  IMPRESSION: 1. Line and tube positions stable. 2. Congestive heart failure with pulmonary edema.   Electronically Signed   By: [place]   On: 06/28/2014 07:29   Dg Chest Port 1 View  06/27/2014   CLINICAL  DATA:  Re-intubation.  EXAM: PORTABLE CHEST - 1 VIEW  COMPARISON:  06/26/2014  FINDINGS: Endotracheal tube tip projects 4.6 cm above the carina.  Left internal jugular central venous line and nasogastric tube are stable.  Opacity has developed at the left lung base obscuring the hemidiaphragm. This may reflect atelectasis with pleural fluid. Pneumonia or other infiltrate is possible. Mild central vascular congestion is stable.  No pneumothorax.  No right effusion.  IMPRESSION: 1. Endotracheal tube tip projects 4.6 cm above the carina. 2. Other support apparatus is stable. 3. New opacity at the left lung base. This may reflect pneumonia. It could be due to atelectasis and pleural fluid.   Electronically Signed   By: Lajean Manes M.D.   On: 06/27/2014 07:37   Dg Chest Port 1v Same Day  06/26/2014   CLINICAL DATA:  Intubated patient.  EXAM: PORTABLE CHEST - 1 VIEW SAME DAY  COMPARISON:  06/25/2014.  FINDINGS: Mild persistent perihilar and medial lung base atelectasis. Lungs are otherwise clear.  Cardiac silhouette is normal in size.  Normal mediastinal contour.  Endotracheal tube is stable in well positioned.  New nasogastric tube passes below the diaphragm to curl within the stomach.  IMPRESSION: 1. New nasogastric tube is well positioned curling within the stomach. 2. No other change.  No acute findings in the lungs.   Electronically Signed   By: Lajean Manes M.D.   On: 06/26/2014 09:11    Anti-infectives: Anti-infectives  Start     Dose/Rate Route Frequency Ordered Stop   06/25/14 0345  cefTRIAXone (ROCEPHIN) 1 g in dextrose 5 % 50 mL IVPB     1 g 100 mL/hr over 30 Minutes Intravenous Every 24 hours 06/25/14 0344        Assessment/Plan: s/p Procedure(s): oversew of rectal varices (N/A) Rectal varicies bleed, s/p ligation - con't to monitor, hopefully no further bleeding rectally.  Will be available should he restart with LGIB  LOS: 4 days    Rosario Jacks., [facility] 06/28/2014

## 2014-06-28 NOTE — Progress Notes (Signed)
UR Completed.  Aarin Bluett Jane [phone] 06/28/2014  

## 2014-06-28 NOTE — Progress Notes (Signed)
[facility] Progress Note Patient Name: Edwin Love DOB: January 10, 1961 MRN: [id number]  Date of Service  06/28/2014   HPI/Events of Note   htnive , no bp meds  eICU Interventions  Ordered prn hydralazine    Intervention Category Major Interventions: Hypertension - evaluation and management  Asencion Noble 06/28/2014, 5:35 PM

## 2014-06-28 NOTE — Progress Notes (Signed)
40 ml Fentanyl gtt wasted in sink. Sherryl Manges, Therapist, sports, BSN witnessed.   Richardean Canal RN, BSN, CCRN

## 2014-06-29 ENCOUNTER — Encounter (HOSPITAL_COMMUNITY): Payer: Self-pay | Admitting: Surgery

## 2014-06-29 LAB — COMPREHENSIVE METABOLIC PANEL
ALBUMIN: 2.2 g/dL — AB (ref 3.5–5.2)
ALK PHOS: 115 U/L (ref 39–117)
ALT: 326 U/L — AB (ref 0–53)
AST: 644 U/L — ABNORMAL HIGH (ref 0–37)
Anion gap: 16 — ABNORMAL HIGH (ref 5–15)
BUN: 55 mg/dL — ABNORMAL HIGH (ref 6–23)
CO2: 31 mEq/L (ref 19–32)
Calcium: 6.3 mg/dL — CL (ref 8.4–10.5)
Chloride: 100 mEq/L (ref 96–112)
Creatinine, Ser: 2.69 mg/dL — ABNORMAL HIGH (ref 0.50–1.35)
GFR calc non Af Amer: 25 mL/min — ABNORMAL LOW (ref >90)
GFR, EST AFRICAN AMERICAN: 29 mL/min — AB (ref >90)
GLUCOSE: 147 mg/dL — AB (ref 70–99)
POTASSIUM: 2.7 meq/L — AB (ref 3.7–5.3)
Sodium: 147 mEq/L (ref 137–147)
Total Bilirubin: 1.6 mg/dL — ABNORMAL HIGH (ref 0.3–1.2)
Total Protein: 5.5 g/dL — ABNORMAL LOW (ref 6.0–8.3)

## 2014-06-29 LAB — BASIC METABOLIC PANEL
Anion gap: 17 — ABNORMAL HIGH (ref 5–15)
BUN: 53 mg/dL — ABNORMAL HIGH (ref 6–23)
CALCIUM: 6.8 mg/dL — AB (ref 8.4–10.5)
CO2: 27 mEq/L (ref 19–32)
CREATININE: 2.25 mg/dL — AB (ref 0.50–1.35)
Chloride: 103 mEq/L (ref 96–112)
GFR calc non Af Amer: 32 mL/min — ABNORMAL LOW (ref >90)
GFR, EST AFRICAN AMERICAN: 37 mL/min — AB (ref >90)
Glucose, Bld: 138 mg/dL — ABNORMAL HIGH (ref 70–99)
Potassium: 3.4 mEq/L — ABNORMAL LOW (ref 3.7–5.3)
SODIUM: 147 meq/L (ref 137–147)

## 2014-06-29 LAB — CBC
HCT: 27.5 % — ABNORMAL LOW (ref 39.0–52.0)
HCT: 27.1 % — ABNORMAL LOW (ref 39.0–52.0)
HEMOGLOBIN: 9.2 g/dL — AB (ref 13.0–17.0)
HEMOGLOBIN: 9 g/dL — AB (ref 13.0–17.0)
MCH: 29.5 pg (ref 26.0–34.0)
MCH: 29.1 pg (ref 26.0–34.0)
MCHC: 33.5 g/dL (ref 30.0–36.0)
MCHC: 33.2 g/dL (ref 30.0–36.0)
MCV: 88.1 fL (ref 78.0–100.0)
MCV: 87.7 fL (ref 78.0–100.0)
PLATELETS: 33 10*3/uL — AB (ref 150–400)
PLATELETS: 37 10*3/uL — AB (ref 150–400)
RBC: 3.12 MIL/uL — AB (ref 4.22–5.81)
RBC: 3.09 MIL/uL — ABNORMAL LOW (ref 4.22–5.81)
RDW: 16.3 % — ABNORMAL HIGH (ref 11.5–15.5)
RDW: 16.2 % — ABNORMAL HIGH (ref 11.5–15.5)
WBC: 12.8 10*3/uL — ABNORMAL HIGH (ref 4.0–10.5)
WBC: 13.2 10*3/uL — ABNORMAL HIGH (ref 4.0–10.5)

## 2014-06-29 LAB — GLUCOSE, CAPILLARY
GLUCOSE-CAPILLARY: 152 mg/dL — AB (ref 70–99)
GLUCOSE-CAPILLARY: 117 mg/dL — AB (ref 70–99)
GLUCOSE-CAPILLARY: 150 mg/dL — AB (ref 70–99)
GLUCOSE-CAPILLARY: 150 mg/dL — AB (ref 70–99)
Glucose-Capillary: 134 mg/dL — ABNORMAL HIGH (ref 70–99)
Glucose-Capillary: 172 mg/dL — ABNORMAL HIGH (ref 70–99)

## 2014-06-29 LAB — PROTIME-INR
INR: 1.56 — AB (ref 0.00–1.49)
INR: 1.55 — AB (ref 0.00–1.49)
Prothrombin Time: 18.7 seconds — ABNORMAL HIGH (ref 11.6–15.2)
Prothrombin Time: 18.6 seconds — ABNORMAL HIGH (ref 11.6–15.2)

## 2014-06-29 LAB — MAGNESIUM: Magnesium: 1.3 mg/dL — ABNORMAL LOW (ref 1.5–2.5)

## 2014-06-29 LAB — PHOSPHORUS: Phosphorus: 3.2 mg/dL (ref 2.3–4.6)

## 2014-06-29 MED ORDER — RESOURCE THICKENUP CLEAR PO POWD
ORAL | Status: DC | PRN
  Filled 2014-06-29 (×3): qty 125

## 2014-06-29 MED ORDER — POTASSIUM CHLORIDE 10 MEQ/50ML IV SOLN
10.0000 meq | INTRAVENOUS | Status: AC
  Administered 2014-06-29 (×6): 10 meq via INTRAVENOUS
  Filled 2014-06-29 (×6): qty 50

## 2014-06-29 MED ORDER — FOLIC ACID 1 MG PO TABS
1.0000 mg | ORAL_TABLET | Freq: Every day | ORAL | Status: DC
  Administered 2014-06-29 – 2014-07-05 (×7): 1 mg via ORAL
  Filled 2014-06-29 (×7): qty 1

## 2014-06-29 MED ORDER — INSULIN ASPART 100 UNIT/ML ~~LOC~~ SOLN
0.0000 [IU] | Freq: Three times a day (TID) | SUBCUTANEOUS | Status: DC
  Administered 2014-06-29 (×2): 1 [IU] via SUBCUTANEOUS
  Administered 2014-06-30: 2 [IU] via SUBCUTANEOUS
  Administered 2014-06-30: 1 [IU] via SUBCUTANEOUS
  Administered 2014-07-01: 2 [IU] via SUBCUTANEOUS
  Administered 2014-07-01: 1 [IU] via SUBCUTANEOUS
  Administered 2014-07-01: 2 [IU] via SUBCUTANEOUS
  Administered 2014-07-02 – 2014-07-03 (×3): 1 [IU] via SUBCUTANEOUS
  Administered 2014-07-03 (×2): 2 [IU] via SUBCUTANEOUS
  Administered 2014-07-04: 1 [IU] via SUBCUTANEOUS
  Administered 2014-07-04: 2 [IU] via SUBCUTANEOUS
  Administered 2014-07-04: 1 [IU] via SUBCUTANEOUS
  Administered 2014-07-05: 2 [IU] via SUBCUTANEOUS

## 2014-06-29 MED ORDER — PANTOPRAZOLE SODIUM 40 MG PO TBEC
40.0000 mg | DELAYED_RELEASE_TABLET | Freq: Every day | ORAL | Status: DC
  Administered 2014-06-29 – 2014-07-02 (×4): 40 mg via ORAL
  Filled 2014-06-29 (×4): qty 1

## 2014-06-29 MED ORDER — VITAMIN B-1 100 MG PO TABS
100.0000 mg | ORAL_TABLET | Freq: Every day | ORAL | Status: DC
  Administered 2014-06-29 – 2014-07-05 (×7): 100 mg via ORAL
  Filled 2014-06-29 (×7): qty 1

## 2014-06-29 MED ORDER — AMLODIPINE BESYLATE 5 MG PO TABS
5.0000 mg | ORAL_TABLET | Freq: Every day | ORAL | Status: DC
  Administered 2014-06-29 – 2014-07-01 (×3): 5 mg via ORAL
  Filled 2014-06-29 (×5): qty 1

## 2014-06-29 MED ORDER — CARVEDILOL 12.5 MG PO TABS
12.5000 mg | ORAL_TABLET | Freq: Two times a day (BID) | ORAL | Status: DC
  Administered 2014-06-29 – 2014-07-02 (×6): 12.5 mg via ORAL
  Filled 2014-06-29 (×10): qty 1

## 2014-06-29 MED ORDER — METOPROLOL TARTRATE 1 MG/ML IV SOLN
5.0000 mg | Freq: Four times a day (QID) | INTRAVENOUS | Status: DC
  Administered 2014-06-29: 5 mg via INTRAVENOUS
  Filled 2014-06-29: qty 5

## 2014-06-29 NOTE — Progress Notes (Signed)
eLink Physician-Brief Progress Note Patient Name: Edwin Love DOB: [birth year] MRN: [id number]  Date of Service  06/29/2014   HPI/Events of Note  Hypokalemia  eICU Interventions  Potassium replaced   Intervention Category Major Interventions: Electrolyte abnormality - evaluation and management  DETERDING,[name] 06/29/2014, 5:15 AM

## 2014-06-29 NOTE — Progress Notes (Signed)
Patient self extubated.  Was placed on PRB, sats currently 100%.  MD aware.   RT will continue to monitor.

## 2014-06-29 NOTE — Progress Notes (Addendum)
PULMONARY / CRITICAL CARE MEDICINE   Name: Edwin Love MRN: [id number] DOB: [birth year]    ADMISSION DATE: 06/24/2014  CONSULTATION DATE: 06/24/2014   REFERRING MD : [name]  PRIMARY SERVICE: PCCM   CHIEF COMPLAINT: Lower GI Hemorrhage   BRIEF PATIENT DESCRIPTION:  53 y.o. M with cirrhosis and esophageal varices brought to ED after being found lying in pool of blood by family. Underwent EGD 7/16 which was negative for UGIB; however, pt continued to have massive hematochezia. Bleeding scan positive for LGIB in sigmoid colon. PCCM consulted and pt admitted to ICU.   SIGNIFICANT EVENTS / STUDIES:  7/16 Abd CT >>> cirrhosis, multiple new hepatic lesions are noted, partial thrombosis of portal vein, portal HTN with multiple varices, periportal adenopathy cannot be excluded, umbilical hernia with herniation of small bowel, extensive small bowel wall edema. 7/16 EGD >>> varices present; however, no acute bleeding.  7/16 Bleeding Scan >>> positive for acute lower GI bleed with source likely within the sigmoid colon.  7/17 Continued brisk BRBPR, shock, altered MS 7/17 Intubated 7/17 Angiogram of IMA, SMA and left gastric artery -No acute arterial bleeding source identified 7/17 Colonscopy w/ active rectal bleeding/cardiac arrest w/ PEA/CPR  7/17 >To OR large rectal varices bleed>oversewn  7/18 Remains intubated. Worsening met acidosis. HCO3 infusion increased. Received 1 unit PRBCs, 1 unit plts and 2 units FFP 7/19 - Acute overcompensated met alkalosis (pH 7.87), DC Bicarb gtt, adjust vent settings         - Hgb improved 7.6 (s/p 1u PRBC, 4 FFP), K 3.0 / Mag 1.1 7/20 - improved metabolic alkalosis off bicarb 7/21 - self-extubated, hypokalemia and hypertension, neg 2.1 liters  LINES / TUBES:  Left IJ TLC 7/16 >>  R radial arterial cath 7/16 >>  ETT 7/17 >> self extubated 7/21  CULTURES:  7/16 HIV Ab >>>NR  7/16 HCV Ab >>> POS  7/16 AFP >>> POS  ANTIBIOTICS:  Ceftriaxone (SBP  px) 7/17 >>   SUBJECTIVE: Patient self-extubated early this morning, is tolerating this well. No further bleeding overnight.  VITAL SIGNS: Temp:  [97.9 F (36.6 C)-99.3 F (37.4 C)] 98.4 F (36.9 C) (07/21 0426) Pulse Rate:  [76-92] 82 (07/21 0700) Resp:  [9-26] 16 (07/21 0700) BP: (131-172)/(55-112) 163/112 mmHg (07/21 0700) SpO2:  [97 %-100 %] 100 % (07/21 0700) Arterial Line BP: (137-184)/(77-107) 184/100 mmHg (07/21 0700) FiO2 (%):  [40 %] 40 % (07/21 0000) Weight:  [210 lb 5.1 oz (95.4 kg)] 210 lb 5.1 oz (95.4 kg) (07/21 0600) HEMODYNAMICS: CVP:  [7 mmHg-17 mmHg] 7 mmHg VENTILATOR SETTINGS: Vent Mode:  [-] SIMV;PSV FiO2 (%):  [40 %] 40 % Set Rate:  [10 bmp] 10 bmp Vt Set:  [450 mL] 450 mL PEEP:  [5 cmH20] 5 cmH20 Pressure Support:  [0 cmH20] 0 cmH20 Plateau Pressure:  [12 cmH20] 12 cmH20 INTAKE / OUTPUT: Intake/Output     07/20 0701 - 07/21 0700 07/21 0701 - 07/22 0700   I.V. (mL/kg) 1352.8 (14.2)    Other 120    NG/GT 1083    IV Piggyback 150    Total Intake(mL/kg) 2705.8 (28.4)    Urine (mL/kg/hr) 4930 (2.2)    Total Output 4930     Net -2224.2           PHYSICAL EXAMINATION: General: Ill-appearing, lying in bed HEENT: NCAT, enucleated left eye  Cardiovascular: RRR, no murmur appreciated Respiratory: Some improved breath sounds, persistent bibasilar crackles  Abdomen: +BS, +easily reducible umbilical hernia, soft  Extremities:  No obvious deformities  Neuro: opens eyes to voice and responds to questions and commands appropriately. Oriented to self and place but not situation.   LABS:  CBC  Recent Labs Lab 06/28/14 0500 06/29/14 0257 06/29/14 0410  WBC 15.6* 12.8* 13.2*  HGB 8.7* 9.2* 9.0*  HCT 25.5* 27.5* 27.1*  PLT 49* 33* 37*   Coag's  Recent Labs Lab 06/25/14 0300  06/25/14 2130  06/28/14 0500 06/29/14 0257 06/29/14 0410  APTT 36  --  46*  --  32  --   --   INR 2.33*  < > 2.35*  < > 1.62* 1.56* 1.55*  < > = values in this interval  not displayed. BMET  Recent Labs Lab 06/27/14 0248 06/28/14 0500 06/29/14 0410  NA 147 148* 147  K 3.0* 3.7 2.7*  CL 100 103 100  CO2 24 32 31  BUN 42* 52* 55*  CREATININE 3.80* 3.43* 2.69*  GLUCOSE 214* 108* 147*   Electrolytes  Recent Labs Lab 06/25/14 0300  06/27/14 0248 06/28/14 0500 06/29/14 0410  CALCIUM 6.5*  < > 6.1* 6.0* 6.3*  MG 1.6  --  1.1*  --   --   PHOS 5.0*  --  4.2  --   --   < > = values in this interval not displayed. Sepsis Markers  Recent Labs Lab 06/25/14 1130 06/27/14 0257 06/28/14 0500  LATICACIDVEN 6.9* 5.7* 1.4   ABG  Recent Labs Lab 06/27/14 1828 06/27/14 2055 06/28/14 1004  PHART 7.528* 7.534* 7.516*  PCO2ART 39.1 43.6 45.4*  PO2ART 149.0* 125.0* 119.0*   Liver Enzymes  Recent Labs Lab 06/27/14 0248 06/28/14 0500 06/29/14 0410  AST 2152* 1057* 644*  ALT 430* 397* 326*  ALKPHOS 79 100 115  BILITOT 2.3* 1.9* 1.6*  ALBUMIN 2.4* 2.3* 2.2*   Cardiac Enzymes  Recent Labs Lab 06/25/14 1130  TROPONINI <0.30   Glucose  Recent Labs Lab 06/28/14 0735 06/28/14 1239 06/28/14 1530 06/28/14 2037 06/28/14 2356 06/29/14 0418  GLUCAP 120* 175* 134* 151* 172* 152*    Imaging Dg Chest Port 1 View  06/28/2014   CLINICAL DATA:  Respiratory failure.  EXAM: PORTABLE CHEST - 1 VIEW  COMPARISON:  06/27/2014.  FINDINGS: Endotracheal tube and NG tube in good anatomic position. Cardiomegaly with pulmonary vascular congestion and bilateral interstitial infiltrate is present. Left pleural effusion present. Findings consistent with congestive heart failure. No pneumothorax. No acute osseous abnormality .  IMPRESSION: 1. Line and tube positions stable. 2. Congestive heart failure with pulmonary edema.   Electronically Signed   By: Marcello Moores  Register   On: 06/28/2014 07:29    ASSESSMENT / PLAN:  PULMONARY A:  Acute respiratory failure - improved LLL opacity, likely atelectasis, ?PNA Pulmonary edema / possible TRALI  Self extubated  7/21 P:  Albuterol neb PRN Repeat CXR in AM for volume status Continue lasix Add IS  CARDIOVASCULAR A:  Hemorrhagic shock - Resolved (off pressors). Currently w/o active bleeding Chronic HTN Diastolic CHF  Cardiac Arrest 7/17 in setting of active hemorrhage - Resolved / resuscitated P:  Off pressors (dc'd levo, vaso) Maintain lasix Hydralazine prn Add IV metop until cleared by speech Dc a line  GASTROINTESTINAL A:  Acute lower GI bleed - secondary to rectal varices s/p OR w/ sutured varices d/t large output bleed (7/17)  Cirrhosis, alcoholic  Hepatitis C Ab positive - ? Old vs new diagnosis Multiple new hepatic lesions noted on CT Abdomen - unclear etiology, DDx includes regenerating nodules, metastatic disease,  multifocal hepatocellular carcinoma.  In addition, note AFP tumor marker is positive > consistent with hepatocellular CA shock liver P:  SUP: Protonix IV q 24 hr Surgery following - If re-bleed, recommend rectal tube balloon (inflated to tamponade), not a surgical candidate further Will re call IR to discuss tips in future LFT in am  Holding lactulose while NPO Advance diet following speech eval today  RENAL A:  AoCKD, Stage 3, baseline cr 0.9-1.4.  Not presently a candidate for HD Combined resp/met alkalosis (over-compensated) - pH 7.53 Severe AG metabolic acidosis - Resolved Hypokalemia - 2.7 Hypocalcemia corrects to wnl P:  Lasix Chem in am  K repletion - 6 runs ordered today bmet in afternoon as well with continued lasix  HEMATOLOGIC A:  Acute blood loss anemia - Improved Hgb 9 Thrombocytopenia - Trending down to 37 this am Coagulopathy - consumptive and hepatic - Improved INR to 1.55 P:  Goal hemoglobin > 7 Goal INR < 1.5  met No role plat transfusions unless bleeding noted Transfuse products as needed to achieve above goals Repeat coags in am   INFECTIOUS A:  SBP prophylaxis indicated in setting of variceal bleed  Hep C positive - new  dx HIV NR  P:  Micro and abx as above Will need ID follow-up outpatient for possible HCV treatment, will need genotype and viral load Stop date 1 week ceftriaxone, 23rd  ENDOCRINE A:  At risk hypoglycemia P:   cbg d5w  NEUROLOGIC A:  Alcohol abuse Chronic alcoholism  At risk alcohol withdrawal Hepatic encephalaopthy P:   Thiamine and folate Holding lactulose with tube out  GLOBAL SLP and PT evals, adding back antihypertensives, to FPTS, to sdu, I will also discuss code status  CODE - Full  Beverlyn Roux, MD, MPH [facility] PGY-2 06/29/2014 7:19 AM  Ccm time 30 min   I have fully examined this patient and agree with above findings.    And edite dinfull  Lavon Paganini. Titus Mould, MD, [facility] Pgr: [facility]

## 2014-06-29 NOTE — Progress Notes (Signed)
I have seen and examined the pt and agree with PA-Osborne's progress note.  

## 2014-06-29 NOTE — Progress Notes (Signed)
Daily Rounding Note  06/29/2014, 8:17 AM  LOS: 5 days   SUBJECTIVE:       Stool is currently brown and liquid.  Self extubated and pulled out tubes overnight.  Has not been reintubated.  OBJECTIVE:         Vital signs in last 24 hours:    Temp:  [97.9 F (36.6 C)-99.3 F (37.4 C)] 98.4 F (36.9 C) (07/21 0426) Pulse Rate:  [76-92] 82 (07/21 0700) Resp:  [9-26] 16 (07/21 0700) BP: (131-172)/(55-112) 163/112 mmHg (07/21 0700) SpO2:  [97 %-100 %] 100 % (07/21 0700) Arterial Line BP: (137-184)/(77-107) 184/100 mmHg (07/21 0700) FiO2 (%):  [40 %] 40 % (07/21 0000) Weight:  [95.4 kg (210 lb 5.1 oz)] 95.4 kg (210 lb 5.1 oz) (07/21 0600) Last BM Date: 06/27/14 General: confused but alert.  Looks chronically ill   Heart: RRR Chest: clear bil Abdomen: soft, NT, ND.  Active BS.  Incontinent of light brown, liquid stool  Extremities: no CCE Neuro/Psych:  Missing left eye, moves all 4s, follows some but not all simple commands.  No able to speak coherently.   Intake/Output from previous day: 07/20 0701 - 07/21 0700 In: 2705.8 [I.V.:1352.8; NG/GT:1083; IV Piggyback:150] Out: 0263 [Urine:4930]  Intake/Output this shift: Total I/O In: -  Out: 350 [Urine:350]  Lab Results:  Recent Labs  06/28/14 0500 06/29/14 0257 06/29/14 0410  WBC 15.6* 12.8* 13.2*  HGB 8.7* 9.2* 9.0*  HCT 25.5* 27.5* 27.1*  PLT 49* 33* 37*   BMET  Recent Labs  06/27/14 0248 06/28/14 0500 06/29/14 0410  NA 147 148* 147  K 3.0* 3.7 2.7*  CL 100 103 100  CO2 24 32 31  GLUCOSE 214* 108* 147*  BUN 42* 52* 55*  CREATININE 3.80* 3.43* 2.69*  CALCIUM 6.1* 6.0* 6.3*   LFT  Recent Labs  06/27/14 0248 06/28/14 0500 06/29/14 0410  PROT 4.7* 5.4* 5.5*  ALBUMIN 2.4* 2.3* 2.2*  AST 2152* 1057* 644*  ALT 430* 397* 326*  ALKPHOS 79 100 115  BILITOT 2.3* 1.9* 1.6*   PT/INR  Recent Labs  06/29/14 0257 06/29/14 0410  LABPROT 18.7* 18.6*   INR 1.56* 1.55*    Studies/Results: Dg Chest Port 1 View  06/28/2014   CLINICAL DATA:  Respiratory failure.  EXAM: PORTABLE CHEST - 1 VIEW  COMPARISON:  06/27/2014.  FINDINGS: Endotracheal tube and NG tube in good anatomic position. Cardiomegaly with pulmonary vascular congestion and bilateral interstitial infiltrate is present. Left pleural effusion present. Findings consistent with congestive heart failure. No pneumothorax. No acute osseous abnormality .  IMPRESSION: 1. Line and tube positions stable. 2. Congestive heart failure with pulmonary edema.   Electronically Signed   By: Marcello Moores  Register   On: 06/28/2014 07:29   Scheduled Meds: . antiseptic oral rinse  15 mL Mouth Rinse q12n4p  . cefTRIAXone (ROCEPHIN)  IV  1 g Intravenous Q24H  . chlorhexidine  15 mL Mouth Rinse BID  . folic acid  1 mg Intravenous Daily  . furosemide  60 mg Intravenous Q12H  . insulin aspart  0-9 Units Subcutaneous 6 times per day  . lactulose  35 g Oral TID  . metoprolol  5 mg Intravenous 4 times per day  . pantoprazole (PROTONIX) IV  40 mg Intravenous Q24H  . potassium chloride  10 mEq Intravenous Q1 Hr x 6  . sodium chloride  3 mL Intravenous Q12H  . thiamine  100 mg Intravenous Daily  Continuous Infusions: . dextrose 50 mL/hr at 06/28/14 2300  . feeding supplement (VITAL AF 1.2 CAL) Stopped (06/29/14 0327)   PRN Meds:.sodium chloride, acetaminophen, acetaminophen, albuterol, fentaNYL, hydrALAZINE, LORazepam  ASSESMENT:   *  LGI bleed. S/p ligation of bleeding rectal varices followed by surgical oversew on 7/17 . Portl htn with extensive varices, partial PV thrombosis on CT scan.  On sbp prophylaxis. No longer on Octreotide, it was given only on 7/17.   *  cirrhosis of liver.  With shock liver, alcoholic hepatitis.  LFTs improving. Newly diagnoses of Hep C.   *  ABL anemia.  S/p multiple PRBC transfusions. Latest on 7/18.  *  Coagulopathy.  S/p multiple transfusions of FFP. Latest on 7/19  *   AKI.  Stage 3 CKD.   *  resp failure, cardiac arrest 7/17.  S/p intubation. CHF by imaging.   *  Thrombocytopenia.   *  Encephalopathy. Ammonia levels of 38 and 35.   *  Umbilical hernia on CT. 01/13/52 repair of incarcerated right inguinal hernia with mesh.    PLAN   *  TIPS when deemed stable by IR. *   Start D 3 diet per rec of SLP.  *  Change to po Protonix.    Azucena Freed  06/29/2014, 8:17 AM Pager: [phone]     Attending physician's note   I have taken an interval history, reviewed the chart and examined the patient. I agree with the Advanced Practitioner's note, impression and recommendations. No recurrent GI bleeding. Consider TIPS if he continues to improve. Liver failure with cirrhosis, alcoholic hepatitis, shock liver, extensive varices. Status changed and family does not want resuscitation if arrest.   Norberto Sorenson T. Fuller Plan, MD [facility]

## 2014-06-29 NOTE — Progress Notes (Signed)
4 Days Post-Op  Subjective: Rectal varix- bleed; oversewn in OR 7/18 Pt extubated self today Has had no bleeding or complication from this No new bleeding from rectum MELD 23 today Labs getting better Pt up in bed; communicating well Being considered for TIPs procedure soon  Objective: Vital signs in last 24 hours: Temp:  [97.8 F (36.6 C)-99.3 F (37.4 C)] 97.8 F (36.6 C) (07/21 0745) Pulse Rate:  [76-101] 96 (07/21 1115) Resp:  [9-26] 18 (07/21 1115) BP: (131-172)/(55-133) 136/120 mmHg (07/21 1100) SpO2:  [96 %-100 %] 99 % (07/21 1115) Arterial Line BP: (148-191)/(81-112) 153/81 mmHg (07/21 1115) FiO2 (%):  [40 %] 40 % (07/21 0000) Weight:  [95.4 kg (210 lb 5.1 oz)] 95.4 kg (210 lb 5.1 oz) (07/21 0600) Last BM Date: 06/29/14  Intake/Output from previous day: 07/20 0701 - 07/21 0700 In: 2705.8 [I.V.:1352.8; NG/GT:1083; IV Piggyback:150] Out: 7616 [Urine:4930] Intake/Output this shift: Total I/O In: 543 [P.O.:240; I.V.:103; IV Piggyback:200] Out: 1300 [Urine:1300]  PE:  Afeb; BP:136/120 Up in chair; pleasant 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 Abd soft; +BS; NT INR 1.55; Cr 2.69; TB 1.6  Lab Results:   Recent Labs  06/29/14 0257 06/29/14 0410  WBC 12.8* 13.2*  HGB 9.2* 9.0*  HCT 27.5* 27.1*  PLT 33* 37*   BMET  Recent Labs  06/28/14 0500 06/29/14 0410  NA 148* 147  K 3.7 2.7*  CL 103 100  CO2 32 31  GLUCOSE 108* 147*  BUN 52* 55*  CREATININE 3.43* 2.69*  CALCIUM 6.0* 6.3*   PT/INR  Recent Labs  06/29/14 0257 06/29/14 0410  LABPROT 18.7* 18.6*  INR 1.56* 1.55*   ABG  Recent Labs  06/27/14 2055 06/28/14 1004  PHART 7.534* 7.516*  HCO3 36.8* 36.8*    Studies/Results: Dg Chest Port 1 View  06/28/2014   CLINICAL DATA:  Respiratory failure.  EXAM: PORTABLE CHEST - 1 VIEW  COMPARISON:  06/27/2014.  FINDINGS: Endotracheal tube and NG tube in good anatomic position. Cardiomegaly with pulmonary vascular congestion and bilateral interstitial  infiltrate is present. Left pleural effusion present. Findings consistent with congestive heart failure. No pneumothorax. No acute osseous abnormality .  IMPRESSION: 1. Line and tube positions stable. 2. Congestive heart failure with pulmonary edema.   Electronically Signed   By: Marcello Moores  Register   On: 06/28/2014 07:29    Anti-infectives: Anti-infectives   Start     Dose/Rate Route Frequency Ordered Stop   06/29/14 0300  cefTRIAXone (ROCEPHIN) 1 g in dextrose 5 % 50 mL IVPB     1 g 100 mL/hr over 30 Minutes Intravenous Every 24 hours 06/28/14 0838 07/02/14 0259   06/25/14 0345  cefTRIAXone (ROCEPHIN) 1 g in dextrose 5 % 50 mL IVPB  Status:  Discontinued     1 g 100 mL/hr over 30 Minutes Intravenous Every 24 hours 06/25/14 0344 06/28/14 0838      Assessment/Plan: s/p Procedure(s): oversew of rectal varices (N/A)  Poss TIPS candidate  IR aware of pt  Will continue to monitor labs MELD 23 today Poss end of week for procedure   LOS: 5 days    Airlie Blumenberg A 06/29/2014

## 2014-06-29 NOTE — Progress Notes (Signed)
Patient ID: [name], male   DOB: [birth year], 53 y.o.   MRN: [id number] 4 Days Post-Op  Subjective: Patient self extubated overnight. No further bloody bowel movements since Friday's surgery  Objective: Vital signs in last 24 hours: Temp:  [97.9 F (36.6 C)-99.3 F (37.4 C)] 98.4 F (36.9 C) (07/21 0426) Pulse Rate:  [76-92] 87 (07/21 0745) Resp:  [9-26] 18 (07/21 0745) BP: (131-172)/(55-112) 163/112 mmHg (07/21 0700) SpO2:  [97 %-100 %] 100 % (07/21 0745) Arterial Line BP: (137-191)/(77-112) 191/112 mmHg (07/21 0745) FiO2 (%):  [40 %] 40 % (07/21 0000) Weight:  [210 lb 5.1 oz (95.4 kg)] 210 lb 5.1 oz (95.4 kg) (07/21 0600) Last BM Date: 06/29/14  Intake/Output from previous day: 07/20 0701 - 07/21 0700 In: 2705.8 [I.V.:1352.8; NG/GT:1083; IV Piggyback:150] Out: 4930 [Urine:4930] Intake/Output this shift: Total I/O In: 100 [I.V.:50; IV Piggyback:50] Out: 350 [Urine:350]  PE: Abd: soft, nontender, nondistended  Lab Results:   Recent Labs  06/29/14 0257 06/29/14 0410  WBC 12.8* 13.2*  HGB 9.2* 9.0*  HCT 27.5* 27.1*  PLT 33* 37*   BMET  Recent Labs  06/28/14 0500 06/29/14 0410  NA 148* 147  K 3.7 2.7*  CL 103 100  CO2 32 31  GLUCOSE 108* 147*  BUN 52* 55*  CREATININE 3.43* 2.69*  CALCIUM 6.0* 6.3*   PT/INR  Recent Labs  06/29/14 0257 06/29/14 0410  LABPROT 18.7* 18.6*  INR 1.56* 1.55*   CMP     Component Value Date/Time   NA 147 06/29/2014 0410   K 2.7* 06/29/2014 0410   CL 100 06/29/2014 0410   CO2 31 06/29/2014 0410   GLUCOSE 147* 06/29/2014 0410   BUN 55* 06/29/2014 0410   CREATININE 2.69* 06/29/2014 0410   CALCIUM 6.3* 06/29/2014 0410   PROT 5.5* 06/29/2014 0410   ALBUMIN 2.2* 06/29/2014 0410   AST 644* 06/29/2014 0410   ALT 326* 06/29/2014 0410   ALKPHOS 115 06/29/2014 0410   BILITOT 1.6* 06/29/2014 0410   GFRNONAA 25* 06/29/2014 0410   GFRAA 29* 06/29/2014 0410   Lipase     Component Value Date/Time   LIPASE 78* 06/24/2014 1100        Studies/Results: Dg Chest Port 1 View  06/28/2014   CLINICAL DATA:  Respiratory failure.  EXAM: PORTABLE CHEST - 1 VIEW  COMPARISON:  06/27/2014.  FINDINGS: Endotracheal tube and NG tube in good anatomic position. Cardiomegaly with pulmonary vascular congestion and bilateral interstitial infiltrate is present. Left pleural effusion present. Findings consistent with congestive heart failure. No pneumothorax. No acute osseous abnormality .  IMPRESSION: 1. Line and tube positions stable. 2. Congestive heart failure with pulmonary edema.   Electronically Signed   By: Marcello Moores  Register   On: 06/28/2014 07:29    Anti-infectives: Anti-infectives   Start     Dose/Rate Route Frequency Ordered Stop   06/29/14 0300  cefTRIAXone (ROCEPHIN) 1 g in dextrose 5 % 50 mL IVPB     1 g 100 mL/hr over 30 Minutes Intravenous Every 24 hours 06/28/14 0838 07/02/14 0259   06/25/14 0345  cefTRIAXone (ROCEPHIN) 1 g in dextrose 5 % 50 mL IVPB  Status:  Discontinued     1 g 100 mL/hr over 30 Minutes Intravenous Every 24 hours 06/25/14 0344 06/28/14 0838       Assessment/Plan  1. POD4, status post ligation of bleeding rectal varices  Plan: 1. No further bloody bowel movements have been noted.  his hemoglobin has remained stable. 2. His  diet may be advanced as per primary service. No further surgical needs. We will sign off.  LOS: 5 days    Aziah Kaiser E 06/29/2014, 8:29 AM Pager: [phone]

## 2014-06-29 NOTE — Evaluation (Signed)
Clinical/Bedside Swallow Evaluation Patient Details  Name: Edwin Love MRN: [id number] Date of Birth: Mar 29, 1961  Today's Date: 06/29/2014 Time: 9678-9381 SLP Time Calculation (min): 21 min  Past Medical History:  Past Medical History  Diagnosis Date  . Hypertension   . Back pain   . Renal disorder     CKD stage 3 noted in 02/2014 [facility] & [facility]  . Anemia 01/2014  . LVH (left ventricular hypertrophy) 02/2014    grade 2 diastolic dysfunction.   . Ankle fracture, left 10/2008    non surgical mgt.   . Thrombocytopenia 06/2014  . Coagulopathy 06/2014   Past Surgical History:  Past Surgical History  Procedure Laterality Date  . Eye surgery Left 1990's    Foreign body  . Inguinal hernia repair Right 02/08/2014    Procedure: HERNIA REPAIR INGUINAL INCARCERATED;  Surgeon: Edward Jolly, MD;  Location: [place];  Service: General;  Laterality: Right;  . Insertion of mesh Right 02/08/2014    Procedure: INSERTION OF MESH;  Surgeon: Edward Jolly, MD;  Location: [place];  Service: General;  Laterality: Right;  . Hernia repair    . Esophagogastroduodenoscopy N/A 06/24/2014    Procedure: ESOPHAGOGASTRODUODENOSCOPY (EGD);  Surgeon: Inda Castle, MD;  Location: [place];  Service: Endoscopy;  Laterality: N/A;  . Colonoscopy N/A 06/25/2014    Procedure: COLONOSCOPY;  Surgeon: Inda Castle, MD;  Location: [place];  Service: Endoscopy;  Laterality: N/A;   HPI:  53 y.o. M with cirrhosis and esophageal varices brought to ED after being found lying in pool of blood by family. Underwent EGD 7/16 which was negative for UGIB; however, pt continued to have massive hematochezia. Bleeding scan positive for LGIB in sigmoid colon. Underwent colonoscopy  and had /cardiac arrest w/ PEA/CPR. Surgery for large rectal varices on 7/17. Pt intubated from 7/17 to 7/21, self extubated overnight.    Assessment / Plan / Recommendation Clinical Impression  Pt demonstrates signs of an acute reversible  dysphagia with subtle evidence of aspiration following sips of thin liquids - coughing, throat clearing. Given findings and risk of decreased airway protection following recent self extubation with hoarse vocal quality, recommend modified dys 3 (mechanical soft) diet with nectar thick liquids. SLP to follow for tolerance and upgrade.     Aspiration Risk  Moderate    Diet Recommendation Dysphagia 3 (Mechanical Soft);Nectar-thick liquid   Liquid Administration via: Cup;Straw Medication Administration: Whole meds with puree Supervision: Staff to assist with self feeding Compensations: Slow rate;Small sips/bites Postural Changes and/or Swallow Maneuvers: Seated upright 90 degrees    Other  Recommendations Oral Care Recommendations: Oral care BID Other Recommendations: Order thickener from pharmacy   Follow Up Recommendations  24 hour supervision/assistance    Frequency and Duration min 2x/week  1 week   Pertinent Vitals/Pain NA    SLP Swallow Goals     Swallow Study Prior Functional Status       General HPI: 53 y.o. M with cirrhosis and esophageal varices brought to ED after being found lying in pool of blood by family. Underwent EGD 7/16 which was negative for UGIB; however, pt continued to have massive hematochezia. Bleeding scan positive for LGIB in sigmoid colon. Underwent colonoscopy  and had /cardiac arrest w/ PEA/CPR. Surgery for large rectal varices on 7/17. Pt intubated from 7/17 to 7/21, self extubated overnight.  Type of Study: Bedside swallow evaluation Previous Swallow Assessment: none Diet Prior to this Study: NPO Temperature Spikes Noted: No Respiratory Status: Nasal cannula  History of Recent Intubation: No Behavior/Cognition: Alert;Cooperative;Pleasant mood Oral Cavity - Dentition: Adequate natural dentition Self-Feeding Abilities: Needs assist Patient Positioning: Upright in bed Baseline Vocal Quality: Hoarse Volitional Cough: Strong Volitional Swallow: Able  to elicit    Oral/Motor/Sensory Function Overall Oral Motor/Sensory Function: Appears within functional limits for tasks assessed   Ice Chips     Thin Liquid Thin Liquid: Impaired Presentation: Cup;Straw Pharyngeal  Phase Impairments: Throat Clearing - Immediate;Cough - Immediate    Nectar Thick Nectar Thick Liquid: Within functional limits   Honey Thick Honey Thick Liquid: Not tested   Puree Puree: Within functional limits   Solid   GO    Solid: Within functional limits      [facility], MA CCC-SLP [phone]  Lynann Beaver 06/29/2014,9:46 AM

## 2014-06-29 NOTE — Progress Notes (Signed)
Pt just came to floor from ICU. Central telemetry notified. Pt is alert and oriented to self and place. Pt in no distress at this time. Pt's heart monitor picking up heart rate in 80's. Pt states no requests at this time. Pt has no signs of bleeding from central line pulled by ICU nurse. Pt given CHG bath.

## 2014-06-29 NOTE — Progress Notes (Signed)
CRITICAL VALUE ALERT  Critical value received:  K-2.7, Ca-6.3  Date of notification:  06/29/2014  Time of notification:  5:11 AM  Critical value read back:Yes.    Nurse who received alert:  Dillard Essex  MD notified (1st page):  Dr. Jimmy Footman  Time of first page:  5:11 AM  MD notified (2nd page):  Time of second page:  Responding MD:  Dr. Jimmy Footman  Time MD responded:  5:11 AM

## 2014-06-29 NOTE — Progress Notes (Signed)
Pt self-extubated.  Pt placed on PRB, sating 100%.  Pt alert and oriented to self and place.  Dr. Jimmy Footman notified.  Will monitor pt.

## 2014-06-29 NOTE — Progress Notes (Signed)
I have had extensive discussions with family daughter and mom. We discussed patients current circumstances and organ failures. We also discussed patient's prior wishes under circumstances such as this. Family has decided to NOT perform resuscitation if arrest but to continue current medical support for now. Wish TIPS if able and continued medical treatments but no life support further if worsen.  Edwin Love. Titus Mould, MD, [place] Pgr: [facility] '

## 2014-06-30 ENCOUNTER — Inpatient Hospital Stay (HOSPITAL_COMMUNITY): Payer: Medicaid Other

## 2014-06-30 DIAGNOSIS — K7460 Unspecified cirrhosis of liver: Secondary | ICD-10-CM

## 2014-06-30 DIAGNOSIS — G9340 Encephalopathy, unspecified: Secondary | ICD-10-CM

## 2014-06-30 DIAGNOSIS — J9600 Acute respiratory failure, unspecified whether with hypoxia or hypercapnia: Secondary | ICD-10-CM

## 2014-06-30 LAB — CBC
HCT: 29.4 % — ABNORMAL LOW (ref 39.0–52.0)
Hemoglobin: 9.7 g/dL — ABNORMAL LOW (ref 13.0–17.0)
MCH: 29.8 pg (ref 26.0–34.0)
MCHC: 33 g/dL (ref 30.0–36.0)
MCV: 90.2 fL (ref 78.0–100.0)
Platelets: 35 K/uL — ABNORMAL LOW (ref 150–400)
RBC: 3.26 MIL/uL — ABNORMAL LOW (ref 4.22–5.81)
RDW: 16 % — ABNORMAL HIGH (ref 11.5–15.5)
WBC: 8.9 K/uL (ref 4.0–10.5)

## 2014-06-30 LAB — COMPREHENSIVE METABOLIC PANEL
ALT: 250 U/L — ABNORMAL HIGH (ref 0–53)
ANION GAP: 12 (ref 5–15)
AST: 365 U/L — ABNORMAL HIGH (ref 0–37)
Albumin: 2.1 g/dL — ABNORMAL LOW (ref 3.5–5.2)
Alkaline Phosphatase: 131 U/L — ABNORMAL HIGH (ref 39–117)
BUN: 50 mg/dL — AB (ref 6–23)
CALCIUM: 7 mg/dL — AB (ref 8.4–10.5)
CHLORIDE: 102 meq/L (ref 96–112)
CO2: 32 meq/L (ref 19–32)
CREATININE: 2.15 mg/dL — AB (ref 0.50–1.35)
GFR, EST AFRICAN AMERICAN: 39 mL/min — AB (ref >90)
GFR, EST NON AFRICAN AMERICAN: 33 mL/min — AB (ref >90)
GLUCOSE: 91 mg/dL (ref 70–99)
Potassium: 2.7 mEq/L — CL (ref 3.7–5.3)
Sodium: 146 mEq/L (ref 137–147)
Total Bilirubin: 1.6 mg/dL — ABNORMAL HIGH (ref 0.3–1.2)
Total Protein: 5.5 g/dL — ABNORMAL LOW (ref 6.0–8.3)

## 2014-06-30 LAB — HEPATITIS C VRS RNA DETECT BY PCR-QUAL: Hepatitis C Vrs RNA by PCR-Qual: POSITIVE — AB

## 2014-06-30 LAB — GLUCOSE, CAPILLARY
GLUCOSE-CAPILLARY: 162 mg/dL — AB (ref 70–99)
Glucose-Capillary: 110 mg/dL — ABNORMAL HIGH (ref 70–99)
Glucose-Capillary: 117 mg/dL — ABNORMAL HIGH (ref 70–99)
Glucose-Capillary: 143 mg/dL — ABNORMAL HIGH (ref 70–99)

## 2014-06-30 MED ORDER — MAGNESIUM SULFATE 40 MG/ML IJ SOLN
2.0000 g | Freq: Once | INTRAMUSCULAR | Status: AC
  Administered 2014-06-30: 2 g via INTRAVENOUS
  Filled 2014-06-30 (×2): qty 50

## 2014-06-30 MED ORDER — POTASSIUM CHLORIDE CRYS ER 20 MEQ PO TBCR
40.0000 meq | EXTENDED_RELEASE_TABLET | ORAL | Status: AC
  Administered 2014-06-30 (×2): 40 meq via ORAL
  Filled 2014-06-30 (×2): qty 2

## 2014-06-30 MED ORDER — LACTULOSE 10 GM/15ML PO SOLN
30.0000 g | Freq: Every day | ORAL | Status: DC
  Administered 2014-06-30 – 2014-07-02 (×3): 30 g via ORAL
  Filled 2014-06-30 (×3): qty 45

## 2014-06-30 NOTE — Consult Note (Signed)
Physical Medicine and Rehabilitation Consult Reason for Consult: Deconditioning/multi-medical Referring Physician: Family medicine   HPI: Edwin Love is a 53 y.o. right-handed male history of hypertension, grade 2 diastolic dysfunction, chronic renal insufficiency with baseline creatinine 3.43, blindness left eye, cirrhosis and esophageal varices. Patient independent with a cane living with his parents prior to admission. Admitted 06/24/2014 after being found lying in a pool of blood by his family. CT abdomen and pelvis showed partial thrombosis of the portal vein, multiple new hepatic lesions noted as well as extensive small bowel wall edema. Hemoglobin upon admission 6.2 and was transfused. Underwent EGD 06/24/2014 negative for UGIB. Bleeding scan/colonoscopy positive for LGIB in the sigmoid colon per workup of gastroenterology Dr. [name].. Patient with persistent rectal bleeding underwent oversew of rectal varices per Dr. Coralie Keens 06/25/2014. Hospital course dysphagia currently on mechanical soft nectar thick liquids. Physical therapy evaluation completed 06/30/2014 with recommendations for physical medicine rehabilitation consult.  Review of Systems  Eyes: Positive for pain.       Left eye blindness  Gastrointestinal: Positive for nausea, blood in stool and melena.  Musculoskeletal: Positive for back pain.  Psychiatric/Behavioral: Positive for depression.  All other systems reviewed and are negative.  Past Medical History  Diagnosis Date  . Hypertension   . Back pain   . Renal disorder     CKD stage 3 noted in 02/2014 [facility]  . Anemia 01/2014  . LVH (left ventricular hypertrophy) 02/2014    grade 2 diastolic dysfunction.   . Ankle fracture, left 10/2008    non surgical mgt.   . Thrombocytopenia 06/2014  . Coagulopathy 06/2014   Past Surgical History  Procedure Laterality Date  . Eye surgery Left 1990's    Foreign body  . Inguinal hernia repair Right  02/08/2014    Procedure: HERNIA REPAIR INGUINAL INCARCERATED;  Surgeon: Edward Jolly, MD;  Location: [facility];  Service: General;  Laterality: Right;  . Insertion of mesh Right 02/08/2014    Procedure: INSERTION OF MESH;  Surgeon: Edward Jolly, MD;  Location: [facility];  Service: General;  Laterality: Right;  . Hernia repair    . Esophagogastroduodenoscopy N/A 06/24/2014    Procedure: ESOPHAGOGASTRODUODENOSCOPY (EGD);  Surgeon: Inda Castle, MD;  Location: [facility];  Service: Endoscopy;  Laterality: N/A;  . Colonoscopy N/A 06/25/2014    Procedure: COLONOSCOPY;  Surgeon: Inda Castle, MD;  Location: [facility];  Service: Endoscopy;  Laterality: N/A;  . Hemorrhoid surgery N/A 06/25/2014    Procedure: oversew of rectal varices;  Surgeon: Harl Bowie, MD;  Location: [facility];  Service: General;  Laterality: N/A;   Family History  Problem Relation Age of Onset  . Diabetes Brother     Mother too  . Hypertension      family  . Sickle cell anemia Sister    Social History:  reports that he has been smoking Cigarettes.  He has a 17 pack-year smoking history. He does not have any smokeless tobacco history on file. He reports that he drinks alcohol. He reports that he uses illicit drugs (Marijuana and Cocaine). Allergies: No Known Allergies Medications Prior to Admission  Medication Sig Dispense Refill  . amLODipine (NORVASC) 5 MG tablet Take 1 tablet (5 mg total) by mouth daily.  30 tablet  3  . carvedilol (COREG) 12.5 MG tablet Take 12.5 mg by mouth 2 (two) times daily with a meal.      . citalopram (CELEXA)  20 MG tablet Take 20 mg by mouth daily.      . ferrous sulfate 325 (65 FE) MG tablet Take 325 mg by mouth 3 (three) times daily.      . hydrOXYzine (ATARAX/VISTARIL) 50 MG tablet Take 50 mg by mouth daily at 10 pm.      . ibuprofen (ADVIL,MOTRIN) 200 MG tablet Take 400 mg by mouth every 6 (six) hours as needed for moderate pain.      Marland Kitchen lisinopril (PRINIVIL,ZESTRIL) 10 MG  tablet Take 2 tablets (20 mg total) by mouth daily.  60 tablet  3  . paliperidone (INVEGA) 6 MG 24 hr tablet Take 6 mg by mouth daily.        Home: Home Living Family/patient expects to be discharged to:: Private residence Living Arrangements: Parent Available Help at Discharge: Family Type of Home: House Home Access: Stairs to enter Technical brewer of Steps: 3 Entrance Stairs-Rails: Can reach both Interior: Two level;Able to live on main level with bedroom/bathroom Alternate Level Stairs-Number of Steps: 2 (to get to kitchen) Alternate Level Stairs-Rails: None Home Equipment: Cane - single point Additional Comments: walk in shower  Functional History: Prior Function Level of Independence: Independent Comments: occasional use of cane Functional Status:  Mobility: Bed Mobility Overal bed mobility: Needs Assistance Bed Mobility: Rolling;Sidelying to Sit Rolling: Supervision Sidelying to sit: Min assist General bed mobility comments: HOB 0 with rail; vc for sequencing and initiation; assist to raise torso due to slow movement Transfers Overall transfer level: Needs assistance Equipment used: Rolling walker (2 wheeled);None Transfers: Sit to/from Stand Sit to Stand: Mod assist General transfer comment: x3 with and without RW; requires assist to advance forward over BOS Ambulation/Gait Ambulation/Gait assistance: Min assist Ambulation Distance (Feet): 12 Feet Assistive device: Rolling walker (2 wheeled) Gait Pattern/deviations: Step-through pattern;Decreased stride length;Trunk flexed;Wide base of support Gait velocity: very slow Gait velocity interpretation: <1.8 ft/sec, indicative of risk for recurrent falls General Gait Details: pt unfamiliar with use of RW with vc and assist to maneuver and use safely; steady assist    ADL:    Cognition: Cognition Overall Cognitive Status: Impaired/Different from baseline (I'm still fuzzy and not fully myself) Orientation  Level: Oriented to person;Oriented to place;Disoriented to situation Cognition Arousal/Alertness: Awake/alert Behavior During Therapy: WFL for tasks assessed/performed Overall Cognitive Status: Impaired/Different from baseline (I'm still fuzzy and not fully myself) Area of Impairment: Problem solving Problem Solving: Slow processing;Decreased initiation  Blood pressure 105/74, pulse 79, temperature 98.1 F (36.7 C), temperature source Oral, resp. rate 24, height 5\' 9"  (1.753 m), weight 94 kg (207 lb 3.7 oz), SpO2 100.00%. Physical Exam  Vitals reviewed. Constitutional:  53 year old African American male appearing older than stated age  HENT:  Poor dentition  Eyes: Conjunctivae and EOM are normal. Pupils are equal, round, and reactive to light.  Neck: Normal range of motion. Neck supple. No tracheal deviation present. No thyromegaly present.  Cardiovascular: Normal rate and regular rhythm.   Respiratory: Effort normal and breath sounds normal. No respiratory distress.  GI: Bowel sounds are normal. He exhibits distension.  Mild abdominal distention and nontender  Neurological:  Patient is alert with flat affect. He was appropriate for name, age date of birth. He did follow simple commands. A bit distractible, impulsive. Decreased safety awareness. Moved all 4 limbs. Grossly 4- prox to 4/5 distally in UE's and 3/5 prox to 4/5 distallly in LE's. Sensed pain in all 4's.   Skin: Skin is warm and dry.  Psychiatric:  Restless, generally cooperative    Results for orders placed during the hospital encounter of 06/24/14 (from the past 24 hour(s))  GLUCOSE, CAPILLARY     Status: Abnormal   Collection Time    06/29/14  4:12 PM      Result Value Ref Range   Glucose-Capillary 150 (*) 70 - 99 mg/dL  BASIC METABOLIC PANEL     Status: Abnormal   Collection Time    06/29/14  4:15 PM      Result Value Ref Range   Sodium 147  137 - 147 mEq/L   Potassium 3.4 (*) 3.7 - 5.3 mEq/L   Chloride 103  96  - 112 mEq/L   CO2 27  19 - 32 mEq/L   Glucose, Bld 138 (*) 70 - 99 mg/dL   BUN 53 (*) 6 - 23 mg/dL   Creatinine, Ser 2.25 (*) 0.50 - 1.35 mg/dL   Calcium 6.8 (*) 8.4 - 10.5 mg/dL   GFR calc non Af Amer 32 (*) >90 mL/min   GFR calc Af Amer 37 (*) >90 mL/min   Anion gap 17 (*) 5 - 15  MAGNESIUM     Status: Abnormal   Collection Time    06/29/14  4:15 PM      Result Value Ref Range   Magnesium 1.3 (*) 1.5 - 2.5 mg/dL  PHOSPHORUS     Status: None   Collection Time    06/29/14  4:15 PM      Result Value Ref Range   Phosphorus 3.2  2.3 - 4.6 mg/dL  GLUCOSE, CAPILLARY     Status: Abnormal   Collection Time    06/29/14  9:03 PM      Result Value Ref Range   Glucose-Capillary 150 (*) 70 - 99 mg/dL   Comment 1 Notify RN    CBC     Status: Abnormal   Collection Time    06/30/14  3:29 AM      Result Value Ref Range   WBC 8.9  4.0 - 10.5 K/uL   RBC 3.26 (*) 4.22 - 5.81 MIL/uL   Hemoglobin 9.7 (*) 13.0 - 17.0 g/dL   HCT 29.4 (*) 39.0 - 52.0 %   MCV 90.2  78.0 - 100.0 fL   MCH 29.8  26.0 - 34.0 pg   MCHC 33.0  30.0 - 36.0 g/dL   RDW 16.0 (*) 11.5 - 15.5 %   Platelets 35 (*) 150 - 400 K/uL  COMPREHENSIVE METABOLIC PANEL     Status: Abnormal   Collection Time    06/30/14  3:29 AM      Result Value Ref Range   Sodium 146  137 - 147 mEq/L   Potassium 2.7 (*) 3.7 - 5.3 mEq/L   Chloride 102  96 - 112 mEq/L   CO2 32  19 - 32 mEq/L   Glucose, Bld 91  70 - 99 mg/dL   BUN 50 (*) 6 - 23 mg/dL   Creatinine, Ser 2.15 (*) 0.50 - 1.35 mg/dL   Calcium 7.0 (*) 8.4 - 10.5 mg/dL   Total Protein 5.5 (*) 6.0 - 8.3 g/dL   Albumin 2.1 (*) 3.5 - 5.2 g/dL   AST 365 (*) 0 - 37 U/L   ALT 250 (*) 0 - 53 U/L   Alkaline Phosphatase 131 (*) 39 - 117 U/L   Total Bilirubin 1.6 (*) 0.3 - 1.2 mg/dL   GFR calc non Af Amer 33 (*) >90 mL/min   GFR calc Af  Amer 39 (*) >90 mL/min   Anion gap 12  5 - 15  GLUCOSE, CAPILLARY     Status: Abnormal   Collection Time    06/30/14  7:45 AM      Result Value Ref Range     Glucose-Capillary 110 (*) 70 - 99 mg/dL  GLUCOSE, CAPILLARY     Status: Abnormal   Collection Time    06/30/14 11:47 AM      Result Value Ref Range   Glucose-Capillary 117 (*) 70 - 99 mg/dL   Dg Chest Port 1 View  06/30/2014   CLINICAL DATA:  Shortness of breath.  EXAM: PORTABLE CHEST - 1 VIEW  COMPARISON:  06/28/2014.  01/21/2014.  FINDINGS: Interim removal of endotracheal tube and NG tube. Cardiomegaly. Pulmonary vascularity is slightly prominent. Previously identified pulmonary edema has partially cleared. No pleural effusion or pneumothorax. No acute osseus abnormality.  IMPRESSION: 1. Interim removal of lines and tubes. 2. Interim partial clearing of pulmonary edema.   Electronically Signed   By: Marcello Moores  Register   On: 06/30/2014 07:34    Assessment/Plan: Diagnosis: deconditioning due to GIB, multiple medical  1. Does the need for close, 24 hr/day medical supervision in concert with the patient's rehab needs make it unreasonable for this patient to be served in a less intensive setting? Yes 2. Co-Morbidities requiring supervision/potential complications: ETOH abuse, ckd, cirrhosis 3. Due to bladder management, bowel management, safety, skin/wound care, disease management, medication administration, pain management and patient education, does the patient require 24 hr/day rehab nursing? Yes 4. Does the patient require coordinated care of a physician, rehab nurse, PT (1-2 hrs/day, 5 days/week), OT (1-2 hrs/day, 5 days/week) and SLP (1-2 hrs/day, 5 days/week) to address physical and functional deficits in the context of the above medical diagnosis(es)? Yes Addressing deficits in the following areas: balance, endurance, locomotion, strength, transferring, bowel/bladder control, bathing, dressing, feeding, grooming, toileting, cognition, swallowing and psychosocial support 5. Can the patient actively participate in an intensive therapy program of at least 3 hrs of therapy per day at least 5 days  per week? Yes 6. The potential for patient to make measurable gains while on inpatient rehab is excellent 7. Anticipated functional outcomes upon discharge from inpatient rehab are modified independent and supervision  with PT, modified independent and supervision with OT, modified independent and supervision with SLP. 8. Estimated rehab length of stay to reach the above functional goals is: 10-14 days 9. Does the patient have adequate social supports to accommodate these discharge functional goals? Yes and Potentially 10. Anticipated D/C setting: Home 11. Anticipated post D/C treatments: HH therapy and Outpatient therapy 12. Overall Rehab/Functional Prognosis: good  RECOMMENDATIONS: This patient's condition is appropriate for continued rehabilitative care in the following setting: CIR Patient has agreed to participate in recommended program. Potentially Note that insurance prior authorization may be required for reimbursement for recommended care.  Comment: Will need supervision at home. Rehab admissions coordinator will follow up.   Meredith Staggers, MD, [facility]     06/30/2014

## 2014-06-30 NOTE — Progress Notes (Signed)
FMTS Attending Daily Note:  Annabell Sabal MD  [phone] pager  Family Practice pager:  [phone] I have seen and examined this patient and have reviewed their chart. I have discussed this patient with the resident. I agree with the resident's findings, assessment and care plan.  Additionally:  - Patient doing well today, sitting up and eating lunch in bed - Lab abnormalities slowly improving. - Possible TIPS procedure later this week to reduce risk of GI bleed in future - Watch platelets, correction of creatinine - Question will be where he will go on discharge.  Need to explore this further.   Alveda Reasons, MD 06/30/2014 2:51 PM

## 2014-06-30 NOTE — Progress Notes (Signed)
Daily Rounding Note   06/30/2014, 8:28 AM  LOS: 6 days    SUBJECTIVE:       Several non-bloody, loose brown stools yesterday on 30 ml Lactulose tid.  No BMs yet this AM. Pt feels "stiff".  + DOE.  Walking short distance with PT assist today.   OBJECTIVE:         Vital signs in last 24 hours:    Temp:  [97.7 F (36.5 C)-97.9 F (36.6 C)] 97.9 F (36.6 C) (07/22 0359) Pulse Rate:  [74-101] 84 (07/22 0802) Resp:  [14-22] 22 (07/21 1900) BP: (109-158)/(43-133) 135/84 mmHg (07/22 0802) SpO2:  [96 %-100 %] 99 % (07/21 1900) Arterial Line BP: (147-180)/(77-105) 155/82 mmHg (07/21 1100) Weight:  [94 kg (207 lb 3.7 oz)-95 kg (209 lb 7 oz)] 94 kg (207 lb 3.7 oz) (07/22 0359) Last BM Date: 06/29/14 General: looks unwell and worn out.  More alert.  Heart: RRR.  Chest: clear bil.  Abdomen: distended, tight,   Extremities: no CCE Neuro/Psych:  Appropriate, still lethargic though less so. Oriented to hospital and self   Intake/Output from previous day: 07/21 0701 - 07/22 0700 In: 1343 [P.O.:540; I.V.:603; IV Piggyback:200] Out: 3550 [Urine:3550]  Intake/Output this shift:    Lab Results:  Recent Labs  06/29/14 0257 06/29/14 0410 06/30/14 0329  WBC 12.8* 13.2* 8.9  HGB 9.2* 9.0* 9.7*  HCT 27.5* 27.1* 29.4*  PLT 33* 37* 35*   BMET  Recent Labs  06/29/14 0410 06/29/14 1615 06/30/14 0329  NA 147 147 146  K 2.7* 3.4* 2.7*  CL 100 103 102  CO2 31 27 32  GLUCOSE 147* 138* 91  BUN 55* 53* 50*  CREATININE 2.69* 2.25* 2.15*  CALCIUM 6.3* 6.8* 7.0*   LFT  Recent Labs  06/28/14 0500 06/29/14 0410 06/30/14 0329  PROT 5.4* 5.5* 5.5*  ALBUMIN 2.3* 2.2* 2.1*  AST 1057* 644* 365*  ALT 397* 326* 250*  ALKPHOS 100 115 131*  BILITOT 1.9* 1.6* 1.6*   PT/INR  Recent Labs  06/29/14 0257 06/29/14 0410  LABPROT 18.7* 18.6*  INR 1.56* 1.55*    Studies/Results: Dg Chest Port 1 View  06/30/2014    CLINICAL DATA:  Shortness of breath.  EXAM: PORTABLE CHEST - 1 VIEW  COMPARISON:  06/28/2014.  01/21/2014.  FINDINGS: Interim removal of endotracheal tube and NG tube. Cardiomegaly. Pulmonary vascularity is slightly prominent. Previously identified pulmonary edema has partially cleared. No pleural effusion or pneumothorax. No acute osseus abnormality.  IMPRESSION: 1. Interim removal of lines and tubes. 2. Interim partial clearing of pulmonary edema.   Electronically Signed   By: [place]   On: 06/30/2014 07:34    ASSESMENT:   * LGI bleed. S/p ligation of bleeding rectal varices followed by surgical service, oversew on 7/17 .  Portl HTN with extensive varices, partial PV thrombosis on CT scan. On sbp prophylaxis. No longer on Octreotide, it was given only on 7/17.  * Cirrhosis, shock liver, alcoholic hepatitis. LFTs steadily improving. New diagnoses of Hep C.  * ABL anemia. S/p multiple PRBC transfusions. Latest 7/18.  * Coagulopathy. S/p multiple transfusions of FFP. Latest  7/19.  Coags stable.   * AKI. Stage 3 CKD.  *  Hypokalemia, persistent despite runs of Potassium yesterday.  * resp failure, cardiac arrest 7/17. S/p intubation. CHF by imaging.  * Thrombocytopenia. Stable. * Encephalopathy. Ammonia levels of 38 and 35.  * Umbilical hernia on CT. 4/0/37 repair of incarcerated right inguinal hernia with mesh.      PLAN   *  Decrease Lactulose to once daily. This may reduce stool frequency and help stablize potassium level.  *  Possible TIPS "end of week" per IR note of 7/21.   Azucena Freed  06/30/2014, 8:28 AM Pager: [phone]     Attending physician's note   I have taken an interval history, reviewed the chart and examined the patient. I agree with the Advanced Practitioner's note, impression and recommendations.   *Rectal varices with massive GI hemorrhage. No recurrent bleeding and Hb is stable following surgical ligation. Grade 4 esophageal varices without recent  bleeding. Partial PV thrombosis. Possible TIPS later this week. IR following.  *Shock liver superimposed on etoh hepatitis and cirrhosis. LFTs steadily improving and encephalopathy improving. Decrease lactulose to once daily.  *AKI in setting of stage 3 CKD.   Pricilla Riffle. Fuller Plan, MD [facility]

## 2014-06-30 NOTE — Progress Notes (Signed)
5 Days Post-Op  Subjective: rectal varices and bleed oversewn in OR 7/18 No bleeding since Has had brown stools today Now scheduled for IR procedure 7/24 TIPs with obliteration or embolization of hemorrhoidal veins  Objective: Vital signs in last 24 hours: Temp:  [97.7 F (36.5 C)-98.2 F (36.8 C)] 98.1 F (36.7 C) (07/22 1200) Pulse Rate:  [78-85] 79 (07/22 1200) Resp:  [14-24] 24 (07/22 1200) BP: (90-135)/(63-88) 105/74 mmHg (07/22 1200) SpO2:  [96 %-100 %] 100 % (07/22 1200) Weight:  [94 kg (207 lb 3.7 oz)] 94 kg (207 lb 3.7 oz) (07/22 0359) Last BM Date: 06/29/14  Intake/Output from previous day: 07/21 0701 - 07/22 0700 In: 1393 [P.O.:540; I.V.:653; IV Piggyback:200] Out: 3550 [Urine:3550] Intake/Output this shift: Total I/O In: 540 [P.O.:240; I.V.:250; IV Piggyback:50] Out: -   PE:  Afeb; vss Up in bed Has ambulated with PT in hall Abd: distended; NT Extr: FROM    Lab Results:   Recent Labs  06/29/14 0410 06/30/14 0329  WBC 13.2* 8.9  HGB 9.0* 9.7*  HCT 27.1* 29.4*  PLT 37* 35*   BMET  Recent Labs  06/29/14 1615 06/30/14 0329  NA 147 146  K 3.4* 2.7*  CL 103 102  CO2 27 32  GLUCOSE 138* 91  BUN 53* 50*  CREATININE 2.25* 2.15*  CALCIUM 6.8* 7.0*   PT/INR  Recent Labs  06/29/14 0257 06/29/14 0410  LABPROT 18.7* 18.6*  INR 1.56* 1.55*   ABG  Recent Labs  06/27/14 2055 06/28/14 1004  PHART 7.534* 7.516*  HCO3 36.8* 36.8*    Studies/Results: Dg Chest Port 1 View  06/30/2014   CLINICAL DATA:  Shortness of breath.  EXAM: PORTABLE CHEST - 1 VIEW  COMPARISON:  06/28/2014.  01/21/2014.  FINDINGS: Interim removal of endotracheal tube and NG tube. Cardiomegaly. Pulmonary vascularity is slightly prominent. Previously identified pulmonary edema has partially cleared. No pleural effusion or pneumothorax. No acute osseus abnormality.  IMPRESSION: 1. Interim removal of lines and tubes. 2. Interim partial clearing of pulmonary edema.    Electronically Signed   By: Marcello Moores  Register   On: 06/30/2014 07:34    Anti-infectives: Anti-infectives   Start     Dose/Rate Route Frequency Ordered Stop   06/29/14 0300  cefTRIAXone (ROCEPHIN) 1 g in dextrose 5 % 50 mL IVPB     1 g 100 mL/hr over 30 Minutes Intravenous Every 24 hours 06/28/14 0838 07/02/14 0259   06/25/14 0345  cefTRIAXone (ROCEPHIN) 1 g in dextrose 5 % 50 mL IVPB  Status:  Discontinued     1 g 100 mL/hr over 30 Minutes Intravenous Every 24 hours 06/25/14 0344 06/28/14 0838      Assessment/Plan: s/p Procedure(s): oversew of rectal varices (N/A)  Rectal varices No further bleeding after oversewn in OR 7/18 Plan for TIPS with obliteration or embolization of hemorrhoidal veins in IR 7/24 (with anesthesia)   LOS: 6 days    Edwin Love A 06/30/2014

## 2014-06-30 NOTE — Progress Notes (Signed)
CRITICAL VALUE ALERT  Critical value received:  K+ 2.7  Date of notification:  07/01/2014  Time of notification:  04:50  Critical value read back: K+2.7  Nurse who received alert:  Jorge Mandril RN  MD notified (1st page):  Dr Baltazar Apo  Time of first page:  04:54  MD notified (2nd page):  Time of second page:  Responding MD: Dr Baltazar Apo   Time MD responded:  04:55

## 2014-06-30 NOTE — Progress Notes (Signed)
Family Medicine Teaching Service Daily Progress Note Intern Pager: [phone]  Patient name: Edwin Love Medical record number: [id number] Date of birth: [birth year] Age: 53 y.o. Gender: male  Primary Care Provider: No primary provider on file. Consultants: PCCM, GI, IR Code Status: DNR  Pt Overview and Major Events to Date:  7/16: Pt presented with acute GI bleed with a Hb of 6.2. 2u pRBCs. EtOH 131 on arrival. Abd CT showed cirrhosis, multiple new hepatic lesions are noted, partial thrombosis of portal vein, portal HTN with multiple varices, periportal adenopathy cannot be excluded, umbilical hernia with herniation of small bowel, extensive small bowel wall edema.  Endoscopy performed: varices however negative for upper GI bleed. Bleeding scan with a likely source in the sigmoid colon. 2uFFP and vitamin K given. Continued bleeding. 2u pRBCs and L IJ cordis placement. Right arterial line placement.  7/17: Intubated secondary to shock, AMS, and respiratory insufficiency. Levophed started. IR visceral arteriography of IMA, SMA, and left gastric artery: no acute arterial bleeding source found . Colonoscopy resulting in  cardiac arrest with PEA/Asystole with CPR. Emergency OR: Oversew of bleeding rectal varices. Ceftriaxone. Levophed started.  7/18: Worsening metabolic acidosis. HCO3 infusion increased. 1u pRBCs, 1u platelets, 4u FFP. Levophed discontinued in PM 7/19: 1u pRBC, 4 u FFP. Acute overcompensated met alkalosis. Bicarb GTT discontinued and vent settings changed. 7/20: Improving  7/21: Pt self-extubated, dysphagia 3 diet    Assessment and Plan: This is a 53 y/o male with a PMH of alcohol abuse presenting with large quantity BRBPR found to have large rectal varices s/p oversewing of those varices  Acute lower GI bleed: Found to be secondary to rectal varices. This is most likely secondary to portal hypertension due to cirrhosis, most likely due to a combination of alcohol abuse.   Over the course of his hospital stay he's required numerous transfusions, the last on 7/19.The patient is POD 5 s/p oversewing of these varices without further bleeding noted.  - Hemoglobin stable and rising at 9.7 - MELD score 20 today - TIPS when patient deemed stable by IR (end of the week?) - Protonix IV changed to oral protonix  - Patient received octreotide on 7/17  Cirrhosis of the liver: Most likely 2/2 to a long history of EtOH abuse. Patient also noted to have a new diagnosis of HCV (+Ab and RNA). -INR stable at 1.55 -AST and ALT improving. -T bili stable at 1.6.  - Albumin slightly decreased to 2.1 - No ascites or telangiectasias noted on exam.  Hepatitis C: This is a new diagnosis for the patient. This could certainly be contributing to his liver disease.  -GI following, appreciate recs if any  Encephalopathy: Ammonia currently 35. Per mother, patient is at his baseline. Pt oriented to person, place, date, but not completely to situation. - Decrease lactulose from TID to QD. - Continue to monitor mentation.  Acute kidney injury superimposed on CKD stage III: Baseline creatinine 0.9-1.4. On admission it was 1.6 but increased to 3.8, possibly due to hypoperfusion.  - Cre 2.15 today.  - Patient with hypokalemia down to 2.7 this AM. Repleted by Elink - Continue to monitor   Thrombocytopenia: Most likely 2/2 liver failure. No signs of active bleeding currently.  - Plt 35 today. - Will continue to monitor and replete as necessary   History of Respiratory failure: Intubated on 7/17, self-extubated on 7/21. -Breathing well on RA.  - CXR on 7/22 shows interm clearing of pulmonary edema.  -Continue to monitor and  provide O2 to maintain sats >92%  New hepatic lesions noted on CT: Concerning for hepatocellular carcinoma as AFP was positive. Other DDx include regenerating nodules from cirrhosis vs metastatic malignancy. - If he improves during this hospitalization, consider work up  as an outpatient.   Diastolic HF: Echocardiogram showed EF of 55-60% with grade 2 diastolic dysfunction -Will be careful not to use too many IVFs. -Currently does not appear fluid overloaded.  Hypertension: Pt has been mostly hypotensive during this hospitalization -Currently has amlodipine and coreg scheduled from home regimen - Holding lisnopril due to kidney function - May hold if he continues to be hypotensive - Continue to monitor   Umbilical hernia: Note on CT. Pt underwent a repair of a right incarcerated inguinal hernia with mesh on 02/08/14 - Will continue to monitor for symptoms of incarceration.    FEN/GI: Dysphagia 3/ D5 @ 50cc/hr PPx: None currently   Disposition: Pending TIPs procedure. CIR vs SNR  Subjective:  Patient doing well ON. No SOB, chest pain, or abdominal pain. Notes his stools are green without blood. Eating well. No concerns from pt about swallowing  Objective: Temp:  [97.7 F (36.5 C)-97.9 F (36.6 C)] 97.9 F (36.6 C) (07/22 0359) Pulse Rate:  [74-101] 80 (07/21 1900) Resp:  [14-22] 22 (07/21 1900) BP: (109-163)/(43-133) 133/74 mmHg (07/22 0359) SpO2:  [96 %-100 %] 99 % (07/21 1900) Arterial Line BP: (147-191)/(77-112) 155/82 mmHg (07/21 1100) Weight:  [207 lb 3.7 oz (94 kg)-209 lb 7 oz (95 kg)] 207 lb 3.7 oz (94 kg) (07/22 0359) Physical Exam: General: 53 y.o. male in NAD eating in bed. HEENT: NCAT, Left eye reactive to light Oropharynx clear, MMM, enucleated left eye  Cardiovascular: Regular rate and rhythm. No m/r/g noted. Capillary refill <3sec Respiratory: Non-labored, 96% on RA. Bibasilar crackles. Abdomen: +BS, Reducible umbilical hernia, NT, ND, soft. Negative fluid wave.  Extremities: No obvious deformities  Skin: Pale, no wounds noted  Neuro: Alert. Oriented to person, place, and date. No completely to situation.  Laboratory:  Recent Labs Lab 06/29/14 0257 06/29/14 0410 06/30/14 0329  WBC 12.8* 13.2* 8.9  HGB 9.2* 9.0* 9.7*   HCT 27.5* 27.1* 29.4*  PLT 33* 37* 35*    Recent Labs Lab 06/28/14 0500 06/29/14 0410 06/29/14 1615 06/30/14 0329  NA 148* 147 147 146  K 3.7 2.7* 3.4* 2.7*  CL 103 100 103 102  CO2 32 31 27 32  BUN 52* 55* 53* 50*  CREATININE 3.43* 2.69* 2.25* 2.15*  CALCIUM 6.0* 6.3* 6.8* 7.0*  PROT 5.4* 5.5*  --  5.5*  BILITOT 1.9* 1.6*  --  1.6*  ALKPHOS 100 115  --  131*  ALT 397* 326*  --  250*  AST 1057* 644*  --  365*  GLUCOSE 108* 147* 138* 91    RPR neg Heb B Ab neg HCV Ab Reactive, HCV RNA positive  HIV non-reactive    Imaging/Diagnostic Tests: 7/16 Abd NF:[id number], multiple new hepatic lesions are noted, partial thrombosis of portal vein, portal HTN with multiple varices, periportal adenopathy cannot be excluded, umbilical hernia with herniation of small bowel, extensive small bowel wall edema.   7/16 EGD: varices present; however, no acute bleeding.   7/16 Bleeding Scan:positive for acute lower GI bleed with source likely within the sigmoid colon.   7/17 Colonoscopy: Large varix with ulceration of the tip in the rectal vault. Clips placed and injection of ethanolamine, however study was stopped due to hypotension and arrhythmia.    Jaevion Goto  Libby Maw, MD 06/30/2014, 6:54 AM PGY-1, [facility] Intern pager: [phone], text pages welcome

## 2014-06-30 NOTE — Progress Notes (Signed)
Speech Language Pathology Treatment: Dysphagia  Patient Details Name: Edwin Love MRN: [id number] DOB: June 24, 1961 Today's Date: 06/30/2014 Time: 6384-5364 SLP Time Calculation (min): 10 min  Assessment / Plan / Recommendation Clinical Impression  Pt. Seen during skilled dysphagia intervention with trials of thin water.  Continues with evidence of suboptimal laryngeal protection and recommend continue with nectar thick liquid with clinical reasoning explained to pt. As well as swallowing prognosis.  Educated on swallow strategies and importance.  Functional mastication with solid texture, however will continue Dys 3 for energy conservation.  ST will follow for liquid upgrade.   HPI HPI: 53 y.o. M with cirrhosis and esophageal varices brought to ED after being found lying in pool of blood by family. Underwent EGD 7/16 which was negative for UGIB; however, pt continued to have massive hematochezia. Bleeding scan positive for LGIB in sigmoid colon. Underwent colonoscopy  and had /cardiac arrest w/ PEA/CPR. Surgery for large rectal varices on 7/17. Pt intubated from 7/17 to 7/21, self extubated overnight.    Pertinent Vitals WDL  SLP Plan  Continue with current plan of care    Recommendations Diet recommendations: Dysphagia 3 (mechanical soft);Nectar-thick liquid Liquids provided via: Cup;No straw Medication Administration: Whole meds with puree Supervision: Intermittent supervision to cue for compensatory strategies;Patient able to self feed Compensations: Slow rate;Small sips/bites Postural Changes and/or Swallow Maneuvers: Seated upright 90 degrees              Oral Care Recommendations: Oral care BID Follow up Recommendations: 24 hour supervision/assistance Plan: Continue with current plan of care    GO     Orbie Pyo Mavin Dyke M.Ed [employer] [phone]  06/30/2014

## 2014-06-30 NOTE — Progress Notes (Signed)
OT Cancellation Note  Patient Details Name: BRYSE BLANCHETTE MRN: [id number] DOB: 1961/09/25   Cancelled Treatment:    Reason Eval/Treat Not Completed: Fatigue/lethargy limiting ability to participate - pt states he just returned to bed and is too tired to participate.  Will reattempt later today as able.   Darlina Rumpf [place], OTR/L [phone]  06/30/2014, 1:03 PM

## 2014-06-30 NOTE — Progress Notes (Signed)
MD made aware of BP runnin low, AM dose norvasc was not given.

## 2014-06-30 NOTE — Progress Notes (Signed)
eLink Physician-Brief Progress Note Patient Name: Edwin Love DOB: February 01, 1961 MRN: [id number]  Date of Service  06/30/2014   HPI/Events of Note     eICU Interventions  Potassium replaced   Intervention Category Minor Interventions: Electrolytes abnormality - evaluation and management  BYRUM,ROBERT S. 06/30/2014, 4:59 AM

## 2014-06-30 NOTE — Evaluation (Signed)
Physical Therapy Evaluation Patient Details Name: Edwin Love MRN: [id number] DOB: [birth year] Today's Date: 06/30/2014   History of Present Illness  Adm 06/24/14 after found down in pool of blood. +rectal varices bleeding with oversew surgery 13-Jul-2023 (coded during colonoscopy prior to surgery with CPR), intubated 06/16/02/2021 (self-extubated). PMHx- ETOH abuse, cirrhosis, coagulopathy, Lt ankle fx  Clinical Impression  Patient is s/p above surgery with above complications resulting in functional limitations due to the deficits listed below (see PT Problem List). Pt has support of his mother, however she is elderly and unable to provide current level of assist pt requires. Depending on progress, may benefit from/need further inpt therapy prior to d/c home. Patient will benefit from skilled PT to increase their independence and safety with mobility to allow discharge to the venue listed below.       Follow Up Recommendations CIR;Supervision/Assistance - 24 hour    Equipment Recommendations  Rolling walker with 5" wheels    Recommendations for Other Services OT consult     Precautions / Restrictions Precautions Precautions: Fall      Mobility  Bed Mobility Overal bed mobility: Needs Assistance Bed Mobility: Rolling;Sidelying to Sit Rolling: Supervision Sidelying to sit: Min assist       General bed mobility comments: HOB 0 with rail; vc for sequencing and initiation; assist to raise torso due to slow movement  Transfers Overall transfer level: Needs assistance Equipment used: Rolling walker (2 wheeled);None Transfers: Sit to/from Stand Sit to Stand: Mod assist         General transfer comment: x3 with and without RW; requires assist to advance forward over BOS  Ambulation/Gait Ambulation/Gait assistance: Min assist Ambulation Distance (Feet): 12 Feet Assistive device: Rolling walker (2 wheeled) Gait Pattern/deviations: Step-through pattern;Decreased stride  length;Trunk flexed;Wide base of support Gait velocity: very slow Gait velocity interpretation: <1.8 ft/sec, indicative of risk for recurrent falls General Gait Details: pt unfamiliar with use of RW with vc and assist to maneuver and use safely; steady assist  Stairs            Wheelchair Mobility    Modified Rankin (Stroke Patients Only)       Balance Overall balance assessment: Needs assistance Sitting-balance support: No upper extremity supported;Feet supported Sitting balance-Leahy Scale: Good     Standing balance support: Single extremity supported Standing balance-Leahy Scale: Poor Standing balance comment: reaching for support of bedrail or IV polel                             Pertinent Vitals/Pain BP 110/75 (82) supine       98/68 (75) sitting (denied dizziness)       93/55 (64) after short walk ("a little dizzy") SaO2 on RA 97-100%    Home Living Family/patient expects to be discharged to:: Private residence Living Arrangements: Parent Available Help at Discharge: Family Type of Home: House Home Access: Stairs to enter Entrance Stairs-Rails: Can reach both Entrance Stairs-Number of Steps: 3 Home Layout: Two level;Able to live on main level with bedroom/bathroom Home Equipment: Cane - single point Additional Comments: walk in shower    Prior Function Level of Independence: Independent         Comments: occasional use of cane     Hand Dominance   Dominant Hand: Right    Extremity/Trunk Assessment   Upper Extremity Assessment: Overall WFL for tasks assessed           Lower Extremity Assessment:  Generalized weakness      Cervical / Trunk Assessment: Normal  Communication   Communication: Other (comment) (low volume s/p extubation)  Cognition Arousal/Alertness: Awake/alert Behavior During Therapy: WFL for tasks assessed/performed Overall Cognitive Status: Impaired/Different from baseline (I'm still fuzzy and not fully  myself) Area of Impairment: Problem solving             Problem Solving: Slow processing;Decreased initiation      General Comments General comments (skin integrity, edema, etc.): MOther present and states she expects pt to come home with her    Exercises General Exercises - Lower Extremity Ankle Circles/Pumps: Both;20 reps Short Arc Quad: Both;10 reps      Assessment/Plan    PT Assessment Patient needs continued PT services  PT Diagnosis Difficulty walking;Generalized weakness   PT Problem List Decreased activity tolerance;Decreased balance;Decreased mobility;Decreased cognition;Decreased knowledge of use of DME;Decreased safety awareness;Cardiopulmonary status limiting activity  PT Treatment Interventions DME instruction;Gait training;Stair training;Functional mobility training;Therapeutic activities;Therapeutic exercise;Balance training;Patient/family education;Cognitive remediation   PT Goals (Current goals can be found in the Care Plan section) Acute Rehab PT Goals Patient Stated Goal: agrees wants to get stronger PT Goal Formulation: With patient Time For Goal Achievement: 07/07/14 Potential to Achieve Goals: Good    Frequency Min 3X/week   Barriers to discharge        Co-evaluation               End of Session Equipment Utilized During Treatment: Gait belt Activity Tolerance: Patient limited by fatigue Patient left: in chair;with call bell/phone within reach;with chair alarm set;with family/visitor present Nurse Communication: Mobility status         Time: 6644-0347 PT Time Calculation (min): 31 min   Charges:   PT Evaluation $Initial PT Evaluation Tier I: 1 Procedure PT Treatments $Gait Training: 8-22 mins   PT G Codes:          Prathik Aman 07-21-14, 9:33 AM Pager [phone]

## 2014-06-30 NOTE — Progress Notes (Signed)
Rehab Admissions Coordinator Note:  Patient was screened by Retta Diones for appropriateness for an Inpatient Acute Rehab Consult.  At this time, we are recommending Inpatient Rehab consult.  Retta Diones 06/30/2014, 1:34 PM  I can be reached at [phone].

## 2014-07-01 DIAGNOSIS — K921 Melena: Secondary | ICD-10-CM

## 2014-07-01 DIAGNOSIS — N179 Acute kidney failure, unspecified: Secondary | ICD-10-CM

## 2014-07-01 DIAGNOSIS — R5381 Other malaise: Secondary | ICD-10-CM

## 2014-07-01 LAB — BASIC METABOLIC PANEL
Anion gap: 12 (ref 5–15)
BUN: 49 mg/dL — ABNORMAL HIGH (ref 6–23)
CHLORIDE: 101 meq/L (ref 96–112)
CO2: 28 meq/L (ref 19–32)
CREATININE: 2.01 mg/dL — AB (ref 0.50–1.35)
Calcium: 7.2 mg/dL — ABNORMAL LOW (ref 8.4–10.5)
GFR calc Af Amer: 42 mL/min — ABNORMAL LOW (ref >90)
GFR calc non Af Amer: 36 mL/min — ABNORMAL LOW (ref >90)
Glucose, Bld: 82 mg/dL (ref 70–99)
Potassium: 3.2 mEq/L — ABNORMAL LOW (ref 3.7–5.3)
Sodium: 141 mEq/L (ref 137–147)

## 2014-07-01 LAB — CBC
HCT: 27.5 % — ABNORMAL LOW (ref 39.0–52.0)
HEMOGLOBIN: 9.2 g/dL — AB (ref 13.0–17.0)
MCH: 29.6 pg (ref 26.0–34.0)
MCHC: 33.5 g/dL (ref 30.0–36.0)
MCV: 88.4 fL (ref 78.0–100.0)
PLATELETS: 54 10*3/uL — AB (ref 150–400)
RBC: 3.11 MIL/uL — AB (ref 4.22–5.81)
RDW: 15.6 % — ABNORMAL HIGH (ref 11.5–15.5)
WBC: 7.1 10*3/uL (ref 4.0–10.5)

## 2014-07-01 LAB — TYPE AND SCREEN
ABO/RH(D): B POS
Antibody Screen: NEGATIVE

## 2014-07-01 LAB — GLUCOSE, CAPILLARY
GLUCOSE-CAPILLARY: 141 mg/dL — AB (ref 70–99)
Glucose-Capillary: 158 mg/dL — ABNORMAL HIGH (ref 70–99)
Glucose-Capillary: 115 mg/dL — ABNORMAL HIGH (ref 70–99)
Glucose-Capillary: 188 mg/dL — ABNORMAL HIGH (ref 70–99)

## 2014-07-01 MED ORDER — GLUCERNA SHAKE PO LIQD
237.0000 mL | Freq: Two times a day (BID) | ORAL | Status: DC
  Administered 2014-07-01 – 2014-07-04 (×4): 237 mL via ORAL

## 2014-07-01 MED ORDER — POTASSIUM CHLORIDE CRYS ER 20 MEQ PO TBCR
40.0000 meq | EXTENDED_RELEASE_TABLET | Freq: Two times a day (BID) | ORAL | Status: AC
  Administered 2014-07-01 – 2014-07-02 (×3): 40 meq via ORAL
  Filled 2014-07-01 (×3): qty 2

## 2014-07-01 MED ORDER — CEFAZOLIN SODIUM-DEXTROSE 2-3 GM-% IV SOLR
2.0000 g | Freq: Once | INTRAVENOUS | Status: AC
  Administered 2014-07-01: 2 g via INTRAVENOUS
  Filled 2014-07-01: qty 50

## 2014-07-01 MED ORDER — POTASSIUM CHLORIDE CRYS ER 20 MEQ PO TBCR
40.0000 meq | EXTENDED_RELEASE_TABLET | Freq: Once | ORAL | Status: AC
  Administered 2014-07-01: 40 meq via ORAL
  Filled 2014-07-01: qty 2

## 2014-07-01 NOTE — Progress Notes (Signed)
Progress Note   Subjective  Feeling OK today.  Only has pain with coughing.  C/o intermittent DOE, which is unchanged over past several days.  No ABD pain.  No further bleeding.  Last BM last night, normal consistency and brown in color.  Frustrated about his new diagnosis of Hep C.   Objective   Vital signs in last 24 hours: Temp:  [97.9 F (36.6 C)-98.6 F (37 C)] 98.6 F (37 C) (07/23 0733) Pulse Rate:  [78-86] 86 (07/23 0733) Resp:  [18-24] 21 (07/23 0733) BP: (88-119)/(52-77) 118/77 mmHg (07/23 0733) SpO2:  [96 %-100 %] 98 % (07/23 0733) Weight:  [207 lb 3.7 oz (94 kg)] 207 lb 3.7 oz (94 kg) (07/23 0400) Last BM Date: 06/29/14 General:    AA male in NAD, sitting up in bed eating breakfast. Heart:  Regular rate and rhythm; no M/G/R noted. Lungs: Bibasilar crackles, otherwise clear.  Non-labored.  98% on RA. Abdomen:  NT, ND, Soft. Normal bowel sounds. Extremities:  Without edema. 2+ Radial and DP pulses.  SCDs in place. Neurologic:  Alert and Oriented to person, place, and year.   Psych:  Cooperative. Normal mood and affect.  Intake/Output from previous day: 07/22 0701 - 07/23 0700 In: 1710 [P.O.:960; I.V.:700; IV Piggyback:50] Out: 1400 [Urine:1400]  Lab Results:  Recent Labs  06/29/14 0410 06/30/14 0329 07/01/14 0247  WBC 13.2* 8.9 7.1  HGB 9.0* 9.7* 9.2*  HCT 27.1* 29.4* 27.5*  PLT 37* 35* 54*   BMET  Recent Labs  06/29/14 1615 06/30/14 0329 07/01/14 0247  NA 147 146 141  K 3.4* 2.7* 3.2*  CL 103 102 101  CO2 27 32 28  GLUCOSE 138* 91 82  BUN 53* 50* 49*  CREATININE 2.25* 2.15* 2.01*  CALCIUM 6.8* 7.0* 7.2*   LFT  Recent Labs  06/30/14 0329  PROT 5.5*  ALBUMIN 2.1*  AST 365*  ALT 250*  ALKPHOS 131*  BILITOT 1.6*   PT/INR  Recent Labs  06/29/14 0257 06/29/14 0410  LABPROT 18.7* 18.6*  INR 1.56* 1.55*    Studies/Results: Dg Chest Port 1 View  06/30/2014   CLINICAL DATA:  Shortness of breath.  EXAM: PORTABLE CHEST - 1 VIEW   COMPARISON:  06/28/2014.  01/21/2014.  FINDINGS: Interim removal of endotracheal tube and NG tube. Cardiomegaly. Pulmonary vascularity is slightly prominent. Previously identified pulmonary edema has partially cleared. No pleural effusion or pneumothorax. No acute osseus abnormality.  IMPRESSION: 1. Interim removal of lines and tubes. 2. Interim partial clearing of pulmonary edema.   Electronically Signed   By: Marcello Moores  Register   On: 06/30/2014 07:34       [facility] Attending  I have also seen and assessed the patient and agree with the above note.  Assessment  * LGI bleed. S/p ligation of bleeding rectal varices followed by surgical service, oversewn on 7/17. No bleeding since * Portl HTN with extensive varices, partial PV thrombosis on CT scan. On sbp prophylaxis. No longer on Octreotide, it was given only on 7/17.  * Cirrhosis, shock liver, alcoholic hepatitis. LFTs steadily improving.  * Hepatitis C - New diagnosis this visit. * ABL anemia. S/p multiple PRBC transfusions. Latest 7/18. Most recent Hgb is 9.2 * Coagulopathy. S/p multiple transfusions of FFP. Latest 7/19.  Coags stable.   * AKI. Stage 3 CKD.  *  Hypokalemia- Persistent despite potassium administration.  Last 3.2 (07/01/14 @ 0247)  * resp failure, cardiac arrest 7/17. S/p intubation. CHF by imaging.  *  Thrombocytopenia. Stable. * Encephalopathy. Ammonia level of 35 on 06/24/14. Mental status improving. * Umbilical hernia on CT. Chronic. 02/08/14 repair of incarcerated right inguinal hernia with mesh.        PLAN   *  Lactulose decreased to once daily on 06/30/14. This may reduce stool frequency and help stablize potassium level.  *  TIPS with obliteration or embolization of hemorrhoidal veins scheduled for 7/24 per IR note on 06/30/14.  LOS: 7  Lollie Marrow  07/01/2014, 8:40 AM Pager: [phone]   GI Attending  I have also seen and assessed the patient and agree with the above note. I have personally  seen the patient, reviewed and repeated key elements of the history and physical and participated in formation of the assessment and plan the student has documented.  Gatha Mayer, MD, [facility] [phone] (pager) 07/01/2014 1:44 PM

## 2014-07-01 NOTE — Evaluation (Signed)
Occupational Therapy Evaluation Patient Details Name: Edwin Love MRN: [id number] DOB: August 26, 1961 Today's Date: 07/01/2014    History of Present Illness Adm 06/24/14 after found down in pool of blood. +rectal varices bleeding with oversew surgery 07/26/23 (coded during colonoscopy prior to surgery with CPR), intubated 06/2020/08/16 (self-extubated). PMHx- ETOH abuse, cirrhosis, coagulopathy, Lt ankle fx   Clinical Impression   Pt admitted with above. He demonstrates the below listed deficits and will benefit from continued OT to maximize safety and independence with BADLs.  Pt presents to OT with generalized weakness; impaired balance, impaired cognition including impaired attention, decreased problem solving, decreased awareness, decreased safety and judgement; Currenlty, he requires mod A for BADLs.   He would benefit from post acute rehab - recommend CIR      Follow Up Recommendations  CIR;Supervision/Assistance - 24 hour    Equipment Recommendations  3 in 1 bedside comode;Tub/shower bench    Recommendations for Other Services       Precautions / Restrictions Precautions Precautions: Fall      Mobility Bed Mobility Overal bed mobility: Needs Assistance Bed Mobility: Supine to Sit;Sit to Supine     Supine to sit: Min assist Sit to supine: Mod assist   General bed mobility comments: Requires assist to initiate movment of LEs off bed and assist to lift trunk.  Pt required assist to lift legs back onto bed when moving to supine  Transfers Overall transfer level: Needs assistance Equipment used: Rolling walker (2 wheeled);None Transfers: Sit to/from [facility] Sit to Stand: Mod assist Stand pivot transfers: Min assist;+2 physical assistance       General transfer comment: Assist to move into standing and for balance and walker placement     Balance Overall balance assessment: Needs assistance Sitting-balance support: Feet supported Sitting  balance-Leahy Scale: Good     Standing balance support: Single extremity supported Standing balance-Leahy Scale: Poor                              ADL Overall ADL's : Needs assistance/impaired Eating/Feeding: Supervision/ safety;Sitting   Grooming: Wash/dry hands;Wash/dry face;Oral care;Minimal assistance;Standing   Upper Body Bathing: Minimal assitance;Sitting   Lower Body Bathing: Moderate assistance;Sit to/from stand   Upper Body Dressing : Moderate assistance;Sitting   Lower Body Dressing: Maximal assistance;Sit to/from stand   Toilet Transfer: Minimal assistance;+2 for physical assistance;Ambulation;Comfort height toilet Toilet Transfer Details (indicate cue type and reason): Requires assist to line up correctly with toilet and assist to control descent Toileting- Clothing Manipulation and Hygiene: Moderate assistance;Sit to/from stand       Functional mobility during ADLs: Minimal assistance;+2 for physical assistance General ADL Comments: Pt self distracts easily.  Began to doff socks when prompted, but lost focus and forgot to complete task     Vision                 Additional Comments: Pt does not appear to scan environment efficienctly    Perception     Praxis      Pertinent Vitals/Pain See vitals flow sheet. VSS     Hand Dominance Right   Extremity/Trunk Assessment Upper Extremity Assessment Upper Extremity Assessment: Generalized weakness   Lower Extremity Assessment Lower Extremity Assessment: Defer to PT evaluation   Cervical / Trunk Assessment Cervical / Trunk Assessment: Normal   Communication Communication Communication: Other (comment) (low volume and minimal interaction )   Cognition Arousal/Alertness: Lethargic Behavior During Therapy: Flat affect  Overall Cognitive Status: Impaired/Different from baseline Area of Impairment: Attention;Safety/judgement;Awareness;Problem solving   Current Attention Level: Selective  (with min verbal cues)     Safety/Judgement: Decreased awareness of safety;Decreased awareness of deficits Awareness: Intellectual Problem Solving: Slow processing;Difficulty sequencing;Requires verbal cues;Requires tactile cues General Comments: Pt impulsive with poor awareness of safety.  Requires cues for problem solving and sequencing    General Comments       Exercises       Shoulder Instructions      Home Living Family/patient expects to be discharged to:: Private residence Living Arrangements: Parent Available Help at Discharge: Family Type of Home: House Home Access: Stairs to enter Technical brewer of Steps: 3 Entrance Stairs-Rails: Can reach both Home Layout: Two level;Able to live on main level with bedroom/bathroom Alternate Level Stairs-Number of Steps: 2 (to get to kitchen) Alternate Level Stairs-Rails: None Bathroom Shower/Tub: Teacher, early years/pre: Standard     Home Equipment: Cane - single point   Additional Comments: walk in shower      Prior Functioning/Environment Level of Independence: Independent        Comments: occasional use of cane;  Pt reports he does not drive, and does not work     OT Diagnosis: Generalized weakness;Cognitive deficits   OT Problem List: Decreased strength;Decreased activity tolerance;Impaired balance (sitting and/or standing);Decreased cognition;Decreased safety awareness;Decreased knowledge of use of DME or AE   OT Treatment/Interventions: Self-care/ADL training;Therapeutic exercise;Neuromuscular education;DME and/or AE instruction;Therapeutic activities;Cognitive remediation/compensation;Patient/family education;Visual/perceptual remediation/compensation;Balance training    OT Goals(Current goals can be found in the care plan section) Acute Rehab OT Goals Patient Stated Goal: did not state OT Goal Formulation: With patient Time For Goal Achievement: 07/15/14 Potential to Achieve Goals: Good  OT  Frequency: Min 2X/week   Barriers to D/C: Decreased caregiver support  unsure if mother will be able to provide necessary level of assist at discharge        Co-evaluation              End of Session Equipment Utilized During Treatment: Gait belt;Rolling walker Nurse Communication: Mobility status  Activity Tolerance: Patient limited by fatigue Patient left: in bed;with call bell/phone within reach;with bed alarm set;with family/visitor present   Time: 1345-1406 OT Time Calculation (min): 21 min Charges:  OT General Charges $OT Visit: 1 Procedure OT Evaluation $Initial OT Evaluation Tier I: 1 Procedure OT Treatments $Self Care/Home Management : 8-22 mins G-Codes:    Edwin Love M Jul 13, 2014, 4:00 PM

## 2014-07-01 NOTE — Discharge Summary (Signed)
[facility] Discharge Summary  Patient name: Edwin Love Medical record number: [id number] Date of birth: [birth year] Age: 53 y.o. Gender: male Date of Admission: 06/24/2014  Date of Discharge: 07/05/2014 Admitting Physician: Lind Covert, MD  Primary Care Provider: No primary provider on file. 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 Pleasant and cooperative.  Cardiovascular: Regular rate and rhythm. 2/6 Systolic murmur appreciated.  Respiratory: CTAB. No rales, rhonchi or wheezing noted.  Abdomen: +BS, soft, nontender, nondistended.  Extremities: Trace pitting edema of the LE bilaterally.  Skin: No rashes noted. 4x71mm soft, compressible, flesh colored papule vs blister on the left forearm at the area of old tape.  Neuro: AO x 4. No focal deficits.  Brief Hospital Course:  Lower GI Bleed/Varices/Portal hypertension/cirrhosis in the setting of chronic EtOH abuse:  Mr. Edwin Love is a 53 y/o male who was found in lying in his bed surrounded by blood by his family on 7/16. When he initially presented to the ED his hemoglobin as 6.2, however it quickly dropped to 4.5.  Lactic acid was 6.9 and ethyl alcohol level was 131. He  was transfused 2u pRBCs and an abdominal CT revealed cirrhosis and portal hypertension with multiple varices. An EGD was performed which showed non-bleeding varices. A bleeding scan noted the likely source was in the sigmoid colon. Given his coagulopathy and continued bleeding, he was transfused 2uFFP, 2u pRBCs, given vitamin K, and admitted to the ICU. A left IJ  cordis and a right radial arterial line were placed.   On 7/17, Mr. Bencomo was intubated secondary to shock, AMS, and respiratory insufficiency. Levophed was started for hypotension.  Ceftriaxone IV q24hrs was started for SBP prophylaxis. An IR visceral arteriography of IMA, SMA, and left gastric artery showed no acute arterial bleeding source found. Colonoscopy noted a large actively bleeding varix with unsuccessful clipping and sclerotherapy. During the procedure, the patient went into PEA arrest requiring bicarb, calcium, insulin, atropine, and blood products along with 8 minutes of CPR until ROSC was obtained. A rectal balloon was placed to tamponade the bleed and he was rushed to the OR for oversewing of the rectal varices.  The patient remained into the ICU with worsening metabolic acidosis which improved with a bicarb drip. He required additional transfusions on 7/18 (1u pRBC, 1u platelets, 4u FFP) and 7/19 (1u pRBC, 4u FFP). On 7/19, Levophed was discontinued as the patient's blood pressures remained stable. On 7/21, the patient self-extubated and was properly oxygenating on nasal cannula.   On 7/22 he was transferred to the SDU. His encephalopathy continued to improve until he was at his baseline per his mother. The patient's liver enzymes peaked on 7/19, with ALT at 430 and AST at 2152. Total bilirubin was 2.3. Over the course of his hospitalization, these numbers continued to improve to practically normal ranges.  On 07/02/14 he underwent a successful TIPS procedure by IR with no complications during the procedure. No encephalopathy or  contrast induced nephropathy noted post-op.  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 May be contributing to cirrhosis. Given the acuity of his hospitalization, treatment not approached in the inpatient setting.   Acute kidney injury sumperimposed on CKD, stage III: Baseline creatinine appeared to be 0.9-1.4. Reached it's peak at 3.8, most likely secondary to hypotension/shock. Creatinine continues to improve on day of discharge.   Thrombocytopenia: His platelet count on admission was 54 (prior to admission it was 315 on 01/2014), it dropped as low as 33 during this hospitalization, however continued to improve throughout his hospital course.   Hypertension: Patient previously hypertensive, however found to be hypotensive throughout this hospitalization as expected. Home lisinopril discontinued due to renal function. Home amlodipine discontinued due to hypotension. Coreg decreased to 6.25mg  BID. May increase to home dose of 12.5mg  BID if pressures permit.   Issues for Follow Up:  - Monitor for encephalopathy. Lactulose discontinued on 07/05/14  as there were no signs of encephalopathy. If there is a change in mentation, consider lactulose TID to achieve 2 bowel movements per day if necessary. If encephalopathic in the setting of 2 BMs per day, consider starting rifaximin. - New hepatic low density lesions noted on CT from 7/16: could be regenerating nodules vs metastatic disease vs hepatocellular carcinoma. Alpha fetal protein also noted to be 2.8.  Consider further work-up if patient recovers. - New hepatitis C diagnosis: treatment not addressed at this hospitalization given all the events, however consider follow up as an outpatient.     Significant Procedures:  7/16 EGD: 4 columns of large varices in the distal third of the esophagus varices without active bleeding. 7/16:Left IJ cordis placement and right arterial line placement.  7/17: Intubated  7/17:  Colonoscopy revealing a large actively bleeding varix with unsuccessful clipping and sclerotherapy ultimately leading to cardiac arrest. 7/17: Oversewing of rectal varices  7/21: Self-extubation 7/24: TIPS, stenting of the main portal vein, coil embolization of porto-systemic shunt   Significant Labs and Imaging:   Recent Labs Lab 07/04/14 0318 07/04/14 1250 07/05/14 0459  WBC 16.0* 16.1* 15.4*  HGB 7.8* 8.4* 8.1*  HCT 23.5* 25.4* 24.5*  PLT 111* 114* 119*    Recent Labs Lab 06/29/14 0410 06/29/14 1615 06/30/14 0329 07/01/14 0247 07/02/14 0010 07/03/14 0255 07/04/14 0318  NA 147 147 146 141 142 144 143  K 2.7* 3.4* 2.7* 3.2* 3.4* 3.9 3.5*  CL 100 103 102 101 102 105 105  CO2 31 27 32 28 28 26 25   GLUCOSE 147* 138* 91 82 184* 105* 117*  BUN 55* 53* 50* 49* 44* 37* 28*  CREATININE 2.69* 2.25* 2.15* 2.01* 1.96* 1.73* 1.65*  CALCIUM 6.3* 6.8* 7.0* 7.2* 7.3* 7.8* 7.4*  MG  --  1.3*  --   --   --   --   --   PHOS  --  3.2  --   --   --   --   --   ALKPHOS 115  --  131*  --  147* 107 103  AST 644*  --  365*  --  110* 135* 62*  ALT 326*  --  250*  --  126* 68* 36  ALBUMIN 2.2*  --  2.1*  --  1.8* 1.8* 1.6*    CXR 7/16: The study is limited due to hypoinflation. There is  new enlargement of the cardiac silhouette without evidence of pulmonary edema.  7/16 Abd DJ:[id number], multiple new hepatic lesions are noted, partial thrombosis of portal vein, portal HTN with multiple varices, periportal adenopathy cannot be excluded, umbilical hernia with herniation of small bowel, extensive small bowel wall edema.   7/16 EGD: varices present; however, no acute bleeding.   7/16 Bleeding Scan:positive for acute lower GI bleed with source likely within the sigmoid colon.   7/17 Colonoscopy: Large varix with ulceration of the tip in the rectal vault. Clips placed and injection of ethanolamine, however study was stopped due to hypotension and arrhythmia.    IR angiogram 7/17: Negative  visceral arteriography for arterial bleeding source in the distribution of the inferior mesenteric artery, superior mesenteric artery and left gastric artery  7/20 CXR: Line and tube positions stable. Congestive heart failure with pulmonary edema.   7/22 CXR: Interim removal of lines and tubes. Interim partial clearing of pulmonary edema.   Results/Tests Pending at Time of Discharge: HCV RNA quantitative reflex ultra or genotype   Discharge Medications:    Medication List    STOP taking these medications       amLODipine 5 MG tablet  Commonly known as:  NORVASC     hydrOXYzine 50 MG tablet  Commonly known as:  ATARAX/VISTARIL     ibuprofen 200 MG tablet  Commonly known as:  ADVIL,MOTRIN     lisinopril 10 MG tablet  Commonly known as:  PRINIVIL,ZESTRIL      TAKE these medications       carvedilol 6.25 MG tablet  Commonly known as:  COREG  Take 1 tablet (6.25 mg total) by mouth 2 (two) times daily with a meal.     citalopram 20 MG tablet  Commonly known as:  CELEXA  Take 20 mg by mouth daily.     ferrous sulfate 325 (65 FE) MG tablet  Take 325 mg by mouth 3 (three) times daily.     folic acid 1 MG tablet  Commonly known as:  FOLVITE  Take 1 tablet (1 mg total) by mouth daily.     paliperidone 6 MG 24 hr tablet  Commonly known as:  INVEGA  Take 6 mg by mouth daily.     pantoprazole 40 MG tablet  Commonly known as:  PROTONIX  Take 1 tablet (40 mg total) by mouth daily at 6 (six) AM.     thiamine 100 MG tablet  Take 1 tablet (100 mg total) by mouth daily.        Discharge Instructions: Please refer to Patient Instructions section of EMR for full details.  Patient was counseled important signs and symptoms that should prompt return to medical care, changes in medications, dietary instructions, activity restrictions, and follow up appointments.   Follow-Up Appointments:   Archie Patten, MD 07/05/2014, 12:39 PM PGY-1, [place]

## 2014-07-01 NOTE — Progress Notes (Signed)
Family Medicine Teaching Service Daily Progress Note Intern Pager: [phone]  Patient name: Edwin Love Medical record number: [id number] Date of birth: 1961/09/24 Age: 53 y.o. Gender: male  Primary Care Provider: No primary provider on file. Consultants: PCCM, GI, IR Code Status: DNR  Pt Overview and Major Events to Date:  7/16: Pt presented with acute GI bleed with a Hb of 6.2. 2u pRBCs. EtOH 131 on arrival. Abd CT showed cirrhosis, multiple new hepatic lesions, partial thrombosis of portal vein, portal HTN with multiple varices, periportal adenopathy cannot be excluded, umbilical hernia with herniation of small bowel, extensive small bowel wall edema.  Endoscopy performed: varices however negative for upper GI bleed. Bleeding scan with a likely source in the sigmoid colon. 2uFFP and vitamin K given. Continued bleeding. 2u pRBCs and L IJ cordis placement. Right arterial line placement.  7/17: Intubated secondary to shock, AMS, and respiratory insufficiency. Levophed started. IR visceral arteriography of IMA, SMA, and left gastric artery: no acute arterial bleeding source found . Colonoscopy resulting in  cardiac arrest with PEA/Asystole with CPR. Emergency OR: Oversew of bleeding rectal varices. Ceftriaxone. Levophed started.  7/18: Worsening metabolic acidosis. HCO3 infusion increased. 1u pRBCs, 1u platelets, 4u FFP. Levophed discontinued in PM 7/19: 1u pRBC, 4 u FFP. Acute overcompensated met alkalosis. Bicarb GTT discontinued and vent settings changed. 7/20: Improving  7/21: Pt self-extubated, dysphagia 3 diet    Assessment and Plan: This is a 53 y/o male with a PMH of alcohol abuse presenting with copious BRBPR found to have large rectal varices s/p oversewing of those varices on 7/17  Acute lower GI bleed: Found to be secondary to rectal varices. This is most likely secondary to portal hypertension due to cirrhosis, most likely due to a combination of alcohol abuse and HCV.   Over the course of his hospital stay, he's required numerous transfusions, the last on 7/19. The patient is POD 6 s/p oversewing of these varices without further bleeding noted.  - Hemoglobin stable at 9.2 - MELD score 20 with 7/22 labs - Protonix IV changed to oral protonix  - Patient received octreotide on 7/17 - TIPS with obliteration or embolization of hemorrhoidal veins in IR 7/24: NPO p MN. Anticoagulants currently being held, will discuss when to re-start them.  Cirrhosis of the liver: Most likely 2/2 to a long history of EtOH abuse. Patient also noted to have a new diagnosis of HCV (+Ab and RNA). -INR stable at 1.55 -AST and ALT improving over hospital course, therefore not checked this AM - Albumin slightly decreased to 2.1 - No ascites or telangiectasias noted on exam.  Hepatitis C: This is a new diagnosis for the patient. This could certainly be contributing to his liver disease.  - Consider infectious disease consult once patient more stable vs outpatient follow up   Encephalopathy: Ammonia: last was 35. Per mother, patient is at his baseline. Pt oriented to person, place, date, but not completely to situation. - Lactulose QD. - Continue to monitor mentation.  Acute kidney injury superimposed on CKD stage III: Baseline creatinine 0.9-1.4. On admission it was 1.6 but increased to 3.8, possibly due to hypoperfusion.  - Cr continues to improve. 2.01 this AM  Hypokalemia: Persistent since this admission. Possibly due to lactulose. K 3.2 this AM. - Replete with KDUR 68mEq this AM - Continue to monitor   Thrombocytopenia: Most likely 2/2 liver failure. No signs of active bleeding currently.  - Platelet improved at 54 today. - Will continue to monitor  and replete as necessary   History of Respiratory failure: Intubated on 7/17, self-extubated on 7/21. -Breathing well on RA.  - CXR on 7/22 shows interm clearing of pulmonary edema.  -Continue to monitor and provide O2 to maintain  sats >92%  New hepatic lesions noted on CT: Concerning for hepatocellular carcinoma as AFP was positive. Other DDx include regenerating nodules from cirrhosis vs metastatic malignancy. - If he improves during this hospitalization, consider work up as an outpatient.   Diastolic HF: Echocardiogram showed EF of 55-60% with grade 2 diastolic dysfunction -Will be careful not to use too many IVFs. -Currently does not appear fluid overloaded.  Hypertension: Pt has been mostly hypotensive during this hospitalization. Currently 119/73 -Currently has amlodipine and coreg scheduled from home regimen - Consider holding amlodipine if pt continues to be hypotensive. - Holding lisnopril due to kidney function - May hold if he continues to be hypotensive - Continue to monitor   Umbilical hernia: Note on CT. Pt underwent a repair of a right incarcerated inguinal hernia with mesh on 02/08/14 - Will continue to monitor for symptoms of incarceration.   FEN/GI: Dysphagia 3, NPO after midnight/ D5 @ 50cc/hr PPx: None currently   Disposition: Pending TIPs procedure. CIR to evaluate today vs SNF  Subjective:  Patient doing well. Denies abdominal pain, chest pain, SOB, or bleeding. Continue to have brown stools without blood.   Objective: Temp:  [97.9 F (36.6 C)-98.2 F (36.8 C)] 98 F (36.7 C) (07/23 0400) Pulse Rate:  [78-84] 81 (07/23 0400) Resp:  [18-24] 18 (07/23 0400) BP: (88-135)/(52-84) 119/73 mmHg (07/23 0400) SpO2:  [96 %-100 %] 98 % (07/23 0400) Weight:  [207 lb 3.7 oz (94 kg)] 207 lb 3.7 oz (94 kg) (07/23 0400) Physical Exam: General: 53 y.o. male in NAD sitting up in bed HEENT: NCAT, Right eye reactive to light, enucleated left eye.  Oropharynx clear, MMM  Cardiovascular: Regular rate and rhythm. No m/r/g noted. Capillary refill <3sec Respiratory: Non-labored, 99% on RA. Bibasilar crackles. Abdomen: +BS, NT, ND, soft. Negative fluid wave.  Extremities: No obvious deformities  Skin: No  rashes noted  Neuro: Alert. Oriented to person, place, and date. No completely to situation.  Laboratory:  Recent Labs Lab 06/29/14 0410 06/30/14 0329 07/01/14 0247  WBC 13.2* 8.9 7.1  HGB 9.0* 9.7* 9.2*  HCT 27.1* 29.4* 27.5*  PLT 37* 35* 54*    Recent Labs Lab 06/28/14 0500 06/29/14 0410 06/29/14 1615 06/30/14 0329 07/01/14 0247  NA 148* 147 147 146 141  K 3.7 2.7* 3.4* 2.7* 3.2*  CL 103 100 103 102 101  CO2 32 31 27 32 28  BUN 52* 55* 53* 50* 49*  CREATININE 3.43* 2.69* 2.25* 2.15* 2.01*  CALCIUM 6.0* 6.3* 6.8* 7.0* 7.2*  PROT 5.4* 5.5*  --  5.5*  --   BILITOT 1.9* 1.6*  --  1.6*  --   ALKPHOS 100 115  --  131*  --   ALT 397* 326*  --  250*  --   AST 1057* 644*  --  365*  --   GLUCOSE 108* 147* 138* 91 82    RPR neg Heb B Ab neg HCV Ab Reactive, HCV RNA positive  HIV non-reactive    Imaging/Diagnostic Tests: 7/16 Abd MV:[id number], multiple new hepatic lesions are noted, partial thrombosis of portal vein, portal HTN with multiple varices, periportal adenopathy cannot be excluded, umbilical hernia with herniation of small bowel, extensive small bowel wall edema.   7/16 EGD: varices  present; however, no acute bleeding.   7/16 Bleeding Scan:positive for acute lower GI bleed with source likely within the sigmoid colon.   7/17 Colonoscopy: Large varix with ulceration of the tip in the rectal vault. Clips placed and injection of ethanolamine, however study was stopped due to hypotension and arrhythmia.    Archie Patten, MD 07/01/2014, 6:10 AM PGY-1, [place] Intern pager: [phone], text pages welcome

## 2014-07-01 NOTE — Progress Notes (Signed)
Speech Language Pathology Treatment: Dysphagia  Patient Details Name: Edwin Love MRN: [id number] DOB: [birth year] Today's Date: 07/01/2014 Time: 3154-0086 SLP Time Calculation (min): 11 min  Assessment / Plan / Recommendation Clinical Impression  Dysphagia treatment with portion of breakfast meal.  Mod verbal cues for smaller sips with clinical reasoning after pt. Consumed half cup of nectar juice in one sip.  No s/s aspiration during brief observation.  Pt. Having surgical procedure tomorrow with short term intubation, therefore, will defer trials of thin liquid until after procedure for pharyngeal healing following hopefully brief intubation.  Continue Dys 3 texture and nectar liquids with swallow precautions.   HPI HPI: 52 y.o. M with cirrhosis and esophageal varices brought to ED after being found lying in pool of blood by family. Underwent EGD 7/16 which was negative for UGIB; however, pt continued to have massive hematochezia. Bleeding scan positive for LGIB in sigmoid colon. Underwent colonoscopy  and had /cardiac arrest w/ PEA/CPR. Surgery for large rectal varices on 7/17. Pt intubated from 7/17 to 7/21, self extubated overnight.    Pertinent Vitals WDL  SLP Plan  Continue with current plan of care    Recommendations Diet recommendations: Dysphagia 3 (mechanical soft);Nectar-thick liquid Liquids provided via: Cup;No straw Medication Administration: Whole meds with puree Supervision: Intermittent supervision to cue for compensatory strategies;Patient able to self feed Compensations: Slow rate;Small sips/bites Postural Changes and/or Swallow Maneuvers: Seated upright 90 degrees              Oral Care Recommendations: Oral care BID Follow up Recommendations: 24 hour supervision/assistance Plan: Continue with current plan of care    GO     Edwin Love M.Ed [employer] [phone]  07/01/2014

## 2014-07-01 NOTE — H&P (Signed)
Edwin Love is an 53 y.o. male.   Chief Complaint: Pt with rectal bleed- comes in through ED Rectal varices: emergent oversewn 7/17 in OR No bleeding since Has had brown thin stools  Portal HTN; etoh cirrhosis; esophageal varices- no bleeding Now scheduled for Transjugular Intrahepatic Portal System shunt with obliteration or embolization of hemorrhoidal veins. Possible femoral vein access; possible paracentesis MELD score 20 7/22 Only new Cr 2.01 today; no new INR or TBili  HPI:  HTN; rectal varix; CKD 3; LVH; etoh abuse/cirrhosis    Past Medical History  Diagnosis Date  . Hypertension   . Back pain   . Renal disorder     CKD stage 3 noted in 02/2014 H & P  . Anemia 01/2014  . LVH (left ventricular hypertrophy) 02/2014    grade 2 diastolic dysfunction.   . Ankle fracture, left 10/2008    non surgical mgt.   . Thrombocytopenia 06/2014  . Coagulopathy 06/2014    Past Surgical History  Procedure Laterality Date  . Eye surgery Left 1990's    Foreign body  . Inguinal hernia repair Right 02/08/2014    Procedure: HERNIA REPAIR INGUINAL INCARCERATED;  Surgeon: Edward Jolly, MD;  Location: [facility];  Service: General;  Laterality: Right;  . Insertion of mesh Right 02/08/2014    Procedure: INSERTION OF MESH;  Surgeon: Edward Jolly, MD;  Location: [facility];  Service: General;  Laterality: Right;  . Hernia repair    . Esophagogastroduodenoscopy N/A 06/24/2014    Procedure: ESOPHAGOGASTRODUODENOSCOPY (EGD);  Surgeon: Inda Castle, MD;  Location: [facility];  Service: Endoscopy;  Laterality: N/A;  . Colonoscopy N/A 06/25/2014    Procedure: COLONOSCOPY;  Surgeon: Inda Castle, MD;  Location: [facility];  Service: Endoscopy;  Laterality: N/A;  . Hemorrhoid surgery N/A 06/25/2014    Procedure: oversew of rectal varices;  Surgeon: Harl Bowie, MD;  Location: [facility];  Service: General;  Laterality: N/A;    Family History  Problem Relation Age of Onset  . Diabetes  Brother     Mother too  . Hypertension      family  . Sickle cell anemia Sister    Social History:  reports that he has been smoking Cigarettes.  He has a 17 pack-year smoking history. He does not have any smokeless tobacco history on file. He reports that he drinks alcohol. He reports that he uses illicit drugs (Marijuana and Cocaine).  Allergies: No Known Allergies  Medications Prior to Admission  Medication Sig Dispense Refill  . amLODipine (NORVASC) 5 MG tablet Take 1 tablet (5 mg total) by mouth daily.  30 tablet  3  . carvedilol (COREG) 12.5 MG tablet Take 12.5 mg by mouth 2 (two) times daily with a meal.      . citalopram (CELEXA) 20 MG tablet Take 20 mg by mouth daily.      . ferrous sulfate 325 (65 FE) MG tablet Take 325 mg by mouth 3 (three) times daily.      . hydrOXYzine (ATARAX/VISTARIL) 50 MG tablet Take 50 mg by mouth daily at 10 pm.      . ibuprofen (ADVIL,MOTRIN) 200 MG tablet Take 400 mg by mouth every 6 (six) hours as needed for moderate pain.      Marland Kitchen lisinopril (PRINIVIL,ZESTRIL) 10 MG tablet Take 2 tablets (20 mg total) by mouth daily.  60 tablet  3  . paliperidone (INVEGA) 6 MG 24 hr tablet Take 6 mg by mouth daily.  Results for orders placed during the hospital encounter of 06/24/14 (from the past 48 hour(s))  GLUCOSE, CAPILLARY     Status: Abnormal   Collection Time    06/29/14  4:12 PM      Result Value Ref Range   Glucose-Capillary 150 (*) 70 - 99 mg/dL  BASIC METABOLIC PANEL     Status: Abnormal   Collection Time    06/29/14  4:15 PM      Result Value Ref Range   Sodium 147  137 - 147 mEq/L   Potassium 3.4 (*) 3.7 - 5.3 mEq/L   Comment: HEMOLYSIS AT THIS LEVEL MAY AFFECT RESULT   Chloride 103  96 - 112 mEq/L   CO2 27  19 - 32 mEq/L   Glucose, Bld 138 (*) 70 - 99 mg/dL   BUN 53 (*) 6 - 23 mg/dL   Creatinine, Ser 2.25 (*) 0.50 - 1.35 mg/dL   Calcium 6.8 (*) 8.4 - 10.5 mg/dL   GFR calc non Af Amer 32 (*) >90 mL/min   GFR calc Af Amer 37 (*) >90  mL/min   Comment: (NOTE)     The eGFR has been calculated using the CKD EPI equation.     This calculation has not been validated in all clinical situations.     eGFR's persistently <90 mL/min signify possible Chronic Kidney     Disease.   Anion gap 17 (*) 5 - 15  MAGNESIUM     Status: Abnormal   Collection Time    06/29/14  4:15 PM      Result Value Ref Range   Magnesium 1.3 (*) 1.5 - 2.5 mg/dL  PHOSPHORUS     Status: None   Collection Time    06/29/14  4:15 PM      Result Value Ref Range   Phosphorus 3.2  2.3 - 4.6 mg/dL  GLUCOSE, CAPILLARY     Status: Abnormal   Collection Time    06/29/14  9:03 PM      Result Value Ref Range   Glucose-Capillary 150 (*) 70 - 99 mg/dL   Comment 1 Notify RN    CBC     Status: Abnormal   Collection Time    06/30/14  3:29 AM      Result Value Ref Range   WBC 8.9  4.0 - 10.5 K/uL   RBC 3.26 (*) 4.22 - 5.81 MIL/uL   Hemoglobin 9.7 (*) 13.0 - 17.0 g/dL   HCT 29.4 (*) 39.0 - 52.0 %   MCV 90.2  78.0 - 100.0 fL   MCH 29.8  26.0 - 34.0 pg   MCHC 33.0  30.0 - 36.0 g/dL   RDW 16.0 (*) 11.5 - 15.5 %   Platelets 35 (*) 150 - 400 K/uL  COMPREHENSIVE METABOLIC PANEL     Status: Abnormal   Collection Time    06/30/14  3:29 AM      Result Value Ref Range   Sodium 146  137 - 147 mEq/L   Potassium 2.7 (*) 3.7 - 5.3 mEq/L   Comment: CRITICAL RESULT CALLED TO, READ BACK BY AND VERIFIED WITH:     R.YOCUM RN [id number] 06/30/14 E.GADDY   Chloride 102  96 - 112 mEq/L   CO2 32  19 - 32 mEq/L   Glucose, Bld 91  70 - 99 mg/dL   BUN 50 (*) 6 - 23 mg/dL   Creatinine, Ser 2.15 (*) 0.50 - 1.35 mg/dL   Calcium 7.0 (*) 8.4 -  10.5 mg/dL   Total Protein 5.5 (*) 6.0 - 8.3 g/dL   Albumin 2.1 (*) 3.5 - 5.2 g/dL   AST 365 (*) 0 - 37 U/L   ALT 250 (*) 0 - 53 U/L   Alkaline Phosphatase 131 (*) 39 - 117 U/L   Total Bilirubin 1.6 (*) 0.3 - 1.2 mg/dL   GFR calc non Af Amer 33 (*) >90 mL/min   GFR calc Af Amer 39 (*) >90 mL/min   Comment: (NOTE)     The eGFR has been  calculated using the CKD EPI equation.     This calculation has not been validated in all clinical situations.     eGFR's persistently <90 mL/min signify possible Chronic Kidney     Disease.   Anion gap 12  5 - 15  GLUCOSE, CAPILLARY     Status: Abnormal   Collection Time    06/30/14  7:45 AM      Result Value Ref Range   Glucose-Capillary 110 (*) 70 - 99 mg/dL  GLUCOSE, CAPILLARY     Status: Abnormal   Collection Time    06/30/14 11:47 AM      Result Value Ref Range   Glucose-Capillary 117 (*) 70 - 99 mg/dL  GLUCOSE, CAPILLARY     Status: Abnormal   Collection Time    06/30/14  4:51 PM      Result Value Ref Range   Glucose-Capillary 143 (*) 70 - 99 mg/dL  GLUCOSE, CAPILLARY     Status: Abnormal   Collection Time    06/30/14  9:49 PM      Result Value Ref Range   Glucose-Capillary 162 (*) 70 - 99 mg/dL  CBC     Status: Abnormal   Collection Time    07/01/14  2:47 AM      Result Value Ref Range   WBC 7.1  4.0 - 10.5 K/uL   RBC 3.11 (*) 4.22 - 5.81 MIL/uL   Hemoglobin 9.2 (*) 13.0 - 17.0 g/dL   HCT 27.5 (*) 39.0 - 52.0 %   MCV 88.4  78.0 - 100.0 fL   MCH 29.6  26.0 - 34.0 pg   MCHC 33.5  30.0 - 36.0 g/dL   RDW 15.6 (*) 11.5 - 15.5 %   Platelets 54 (*) 150 - 400 K/uL   Comment: CONSISTENT WITH PREVIOUS RESULT  BASIC METABOLIC PANEL     Status: Abnormal   Collection Time    07/01/14  2:47 AM      Result Value Ref Range   Sodium 141  137 - 147 mEq/L   Potassium 3.2 (*) 3.7 - 5.3 mEq/L   Chloride 101  96 - 112 mEq/L   CO2 28  19 - 32 mEq/L   Glucose, Bld 82  70 - 99 mg/dL   BUN 49 (*) 6 - 23 mg/dL   Creatinine, Ser 2.01 (*) 0.50 - 1.35 mg/dL   Calcium 7.2 (*) 8.4 - 10.5 mg/dL   GFR calc non Af Amer 36 (*) >90 mL/min   GFR calc Af Amer 42 (*) >90 mL/min   Comment: (NOTE)     The eGFR has been calculated using the CKD EPI equation.     This calculation has not been validated in all clinical situations.     eGFR's persistently <90 mL/min signify possible Chronic Kidney      Disease.   Anion gap 12  5 - 15  GLUCOSE, CAPILLARY     Status: Abnormal  Collection Time    07/01/14  7:32 AM      Result Value Ref Range   Glucose-Capillary 158 (*) 70 - 99 mg/dL  GLUCOSE, CAPILLARY     Status: Abnormal   Collection Time    07/01/14 11:17 AM      Result Value Ref Range   Glucose-Capillary 141 (*) 70 - 99 mg/dL   Dg Chest Port 1 View  06/30/2014   CLINICAL DATA:  Shortness of breath.  EXAM: PORTABLE CHEST - 1 VIEW  COMPARISON:  06/28/2014.  01/21/2014.  FINDINGS: Interim removal of endotracheal tube and NG tube. Cardiomegaly. Pulmonary vascularity is slightly prominent. Previously identified pulmonary edema has partially cleared. No pleural effusion or pneumothorax. No acute osseus abnormality.  IMPRESSION: 1. Interim removal of lines and tubes. 2. Interim partial clearing of pulmonary edema.   Electronically Signed   By: Marcello Moores  Register   On: 06/30/2014 07:34    Review of Systems  Constitutional: Positive for weight loss. Negative for fever.  Respiratory: Positive for shortness of breath.   Cardiovascular: Negative for chest pain.  Gastrointestinal: Positive for abdominal pain and diarrhea. Negative for nausea and vomiting.  Musculoskeletal: Negative for back pain.  Neurological: Positive for dizziness and weakness. Negative for headaches.  Psychiatric/Behavioral: Positive for substance abuse.    Blood pressure 103/58, pulse 79, temperature 98.5 F (36.9 C), temperature source Oral, resp. rate 22, height _0  (1.753 m), weight 94 kg (207 lb 3.7 oz), SpO2 100.00%. Physical Exam  Constitutional: He is oriented to person, place, and time. He appears well-nourished.  Cardiovascular: Normal rate and regular rhythm.   No murmur heard. Respiratory: Effort normal and breath sounds normal. He has no wheezes.  GI: Soft. Bowel sounds are normal. He exhibits distension.  Musculoskeletal: Normal range of motion.  Neurological: He is alert and oriented to person,  place, and time.  Skin: Skin is warm and dry.  Psychiatric: He has a normal mood and affect. His behavior is normal. Judgment and thought content normal.     Assessment/Plan Rectal varix- no acute/active bleeding now etoh cirrhosis; portal htn Scheduled for TIPS with obliteration or embolization of hemorrhoidal veins.  Poss femoral access; poss paracentesis or other indicated procedures. Pt aware of procedure benefits and risks and agreeable to proceed Consent signed andin chart  Tara Wich A 07/01/2014, 1:46 PM

## 2014-07-01 NOTE — Progress Notes (Signed)
NUTRITION FOLLOW UP  Intervention:   Glucerna Shake po BID, each supplement provides 220 kcal and 10 grams of protein RD to follow for nutrition care plan  Nutrition Dx:   Inadequate oral intake now related to limited appetite, dysphagia as evidenced by PO intake 50%, ongoing  Goal:   Pt to meet >/= 90% of their estimated nutrition needs, progressing  Monitor:   PO & supplemental intake, weight, labs, I/O's  Assessment:   53 y.o. M with cirrhosis and esophageal varices brought to ED after being found lying in pool of blood by family. Underwent EGD 7/16 which was negative for UGIB; however, pt continued to have massive hematochezia. Bleeding scan positive for LGIB in sigmoid colon.  S/P colonoscopy with control of bleeding on 25-Jul-2023. Coded during procedure. Colonoscopy demonstrated an actively bleeding varix in the rectal vault. S/P oversew of rectal varices on 07-25-23.  Patient self-extubated 7/21.  Transferred to 2C-Stepdown form 33M-MICU 7/21.  TF (Vital AF 1.2 formula) discontinued via OGT with extubation.  S/p bedside swallow evaluation 7/21.  Diet advanced to Dys 3, nectart-thick liquids.  PO intake 50% per flowsheet records.  Would benefit from addition of oral nutrition supplements.  RD to order.  TIPS with obliteration or embolization of hemorrhoidal veins scheduled for 7/24.  Height: Ht Readings from Last 1 Encounters:  06/29/14 5\' 9"  (1.753 m)    Wt Readings from Last 1 Encounters:  07/01/14 207 lb 3.7 oz (94 kg)   Re-estimated needs:  Kcal: 2100-2300 Protein: 115-130 gm Fluid: 2.1-2.3 L  Skin: right groin incision  Diet Order: Dysphagia 3, nectar-thick liquids   Intake/Output Summary (Last 24 hours) at 07/01/14 1017 Last data filed at 07/01/14 0914  Gross per 24 hour  Intake   1750 ml  Output   1400 ml  Net    350 ml    Labs:   Recent Labs Lab July 24, 2014 0300  06/27/14 0248  06/29/14 1615 06/30/14 0329 07/01/14 0247  NA 146  < > 147  < > 147 146 141   K 5.4*  < > 3.0*  < > 3.4* 2.7* 3.2*  CL 116*  < > 100  < > 103 102 101  CO2 8*  < > 24  < > 27 32 28  BUN 21  < > 42*  < > 53* 50* 49*  CREATININE 1.97*  < > 3.80*  < > 2.25* 2.15* 2.01*  CALCIUM 6.5*  < > 6.1*  < > 6.8* 7.0* 7.2*  MG 1.6  --  1.1*  --  1.3*  --   --   PHOS 5.0*  --  4.2  --  3.2  --   --   GLUCOSE 153*  < > 214*  < > 138* 91 82  < > = values in this interval not displayed.  CBG (last 3)   Recent Labs  06/30/14 1651 06/30/14 2149 07/01/14 0732  GLUCAP 143* 162* 158*    Scheduled Meds: . amLODipine  5 mg Oral Daily  . antiseptic oral rinse  15 mL Mouth Rinse q12n4p  . carvedilol  12.5 mg Oral BID WC  . chlorhexidine  15 mL Mouth Rinse BID  . folic acid  1 mg Oral Daily  . insulin aspart  0-9 Units Subcutaneous TID AC & HS  . lactulose  30 g Oral Daily  . pantoprazole  40 mg Oral Q0600  . potassium chloride  40 mEq Oral BID  . sodium chloride  3 mL  Intravenous Q12H  . thiamine  100 mg Oral Daily    Continuous Infusions: . dextrose 50 mL/hr at 06/30/14 2013    Arthur Holms, RD, LDN Pager #: [phone] After-Hours Pager #: [phone]

## 2014-07-01 NOTE — Progress Notes (Signed)
FMTS Attending Daily Note:  Annabell Sabal MD  [phone] pager  Family Practice pager:  [phone] I have seen and examined this patient and have reviewed their chart. I have discussed this patient with the resident. I agree with the resident's findings, assessment and care plan.  Additionally:  - Plan for TIPS tomorrow - Check T bili/LFTs tomorrow AM to compare before and after procedure.    Alveda Reasons, MD 07/01/2014 3:48 PM

## 2014-07-02 ENCOUNTER — Inpatient Hospital Stay (HOSPITAL_COMMUNITY): Payer: Medicaid Other

## 2014-07-02 ENCOUNTER — Encounter (HOSPITAL_COMMUNITY)
Admission: EM | Disposition: A | Discharge status: Rehabilitation Facility | Payer: Self-pay | Source: Home / Self Care | Attending: Family Medicine

## 2014-07-02 ENCOUNTER — Encounter (HOSPITAL_COMMUNITY): Payer: Self-pay | Admitting: Anesthesiology

## 2014-07-02 ENCOUNTER — Encounter (HOSPITAL_COMMUNITY): Payer: Medicaid Other | Admitting: Certified Registered Nurse Anesthetist

## 2014-07-02 ENCOUNTER — Inpatient Hospital Stay (HOSPITAL_COMMUNITY): Payer: Medicaid Other | Admitting: Certified Registered Nurse Anesthetist

## 2014-07-02 HISTORY — PX: RADIOLOGY WITH ANESTHESIA: SHX6223

## 2014-07-02 LAB — CBC
HEMATOCRIT: 27 % — AB (ref 39.0–52.0)
HEMATOCRIT: 26.1 % — AB (ref 39.0–52.0)
HEMOGLOBIN: 8.6 g/dL — AB (ref 13.0–17.0)
Hemoglobin: 8.8 g/dL — ABNORMAL LOW (ref 13.0–17.0)
MCH: 28.9 pg (ref 26.0–34.0)
MCH: 29.3 pg (ref 26.0–34.0)
MCHC: 32.6 g/dL (ref 30.0–36.0)
MCHC: 33 g/dL (ref 30.0–36.0)
MCV: 88.5 fL (ref 78.0–100.0)
MCV: 88.8 fL (ref 78.0–100.0)
Platelets: 73 10*3/uL — ABNORMAL LOW (ref 150–400)
Platelets: 74 10*3/uL — ABNORMAL LOW (ref 150–400)
RBC: 3.05 MIL/uL — ABNORMAL LOW (ref 4.22–5.81)
RBC: 2.94 MIL/uL — AB (ref 4.22–5.81)
RDW: 15.5 % (ref 11.5–15.5)
RDW: 15.5 % (ref 11.5–15.5)
WBC: 7 10*3/uL (ref 4.0–10.5)
WBC: 7.7 10*3/uL (ref 4.0–10.5)

## 2014-07-02 LAB — GLUCOSE, CAPILLARY
GLUCOSE-CAPILLARY: 119 mg/dL — AB (ref 70–99)
Glucose-Capillary: 130 mg/dL — ABNORMAL HIGH (ref 70–99)
Glucose-Capillary: 113 mg/dL — ABNORMAL HIGH (ref 70–99)

## 2014-07-02 LAB — PROTIME-INR
INR: 1.43 (ref 0.00–1.49)
Prothrombin Time: 17.5 seconds — ABNORMAL HIGH (ref 11.6–15.2)

## 2014-07-02 LAB — COMPREHENSIVE METABOLIC PANEL
ALBUMIN: 1.8 g/dL — AB (ref 3.5–5.2)
ALT: 126 U/L — ABNORMAL HIGH (ref 0–53)
AST: 110 U/L — AB (ref 0–37)
Alkaline Phosphatase: 147 U/L — ABNORMAL HIGH (ref 39–117)
Anion gap: 12 (ref 5–15)
BILIRUBIN TOTAL: 1.1 mg/dL (ref 0.3–1.2)
BUN: 44 mg/dL — ABNORMAL HIGH (ref 6–23)
CHLORIDE: 102 meq/L (ref 96–112)
CO2: 28 mEq/L (ref 19–32)
Calcium: 7.3 mg/dL — ABNORMAL LOW (ref 8.4–10.5)
Creatinine, Ser: 1.96 mg/dL — ABNORMAL HIGH (ref 0.50–1.35)
GFR calc Af Amer: 43 mL/min — ABNORMAL LOW (ref >90)
GFR calc non Af Amer: 37 mL/min — ABNORMAL LOW (ref >90)
Glucose, Bld: 184 mg/dL — ABNORMAL HIGH (ref 70–99)
Potassium: 3.4 mEq/L — ABNORMAL LOW (ref 3.7–5.3)
SODIUM: 142 meq/L (ref 137–147)
Total Protein: 5.4 g/dL — ABNORMAL LOW (ref 6.0–8.3)

## 2014-07-02 LAB — APTT: APTT: 36 s (ref 24–37)

## 2014-07-02 SURGERY — RADIOLOGY WITH ANESTHESIA
Anesthesia: General

## 2014-07-02 MED ORDER — NEOSTIGMINE METHYLSULFATE 10 MG/10ML IV SOLN
INTRAVENOUS | Status: DC | PRN
  Administered 2014-07-02: 5 mg via INTRAVENOUS

## 2014-07-02 MED ORDER — CEFAZOLIN SODIUM-DEXTROSE 2-3 GM-% IV SOLR
INTRAVENOUS | Status: AC
  Administered 2014-07-02: 2 g via INTRAVENOUS
  Filled 2014-07-02: qty 50

## 2014-07-02 MED ORDER — ARTIFICIAL TEARS OP OINT
TOPICAL_OINTMENT | OPHTHALMIC | Status: DC | PRN
  Administered 2014-07-02: 1 via OPHTHALMIC

## 2014-07-02 MED ORDER — ALBUMIN HUMAN 5 % IV SOLN
INTRAVENOUS | Status: DC | PRN
  Administered 2014-07-02 (×2): via INTRAVENOUS

## 2014-07-02 MED ORDER — EPHEDRINE SULFATE 50 MG/ML IJ SOLN
INTRAMUSCULAR | Status: DC | PRN
  Administered 2014-07-02 (×4): 10 mg via INTRAVENOUS

## 2014-07-02 MED ORDER — SUCCINYLCHOLINE CHLORIDE 20 MG/ML IJ SOLN
INTRAMUSCULAR | Status: DC | PRN
  Administered 2014-07-02: 120 mg via INTRAVENOUS

## 2014-07-02 MED ORDER — IODIXANOL 320 MG/ML IV SOLN
100.0000 mL | Freq: Once | INTRAVENOUS | Status: AC | PRN
  Administered 2014-07-02: 160 mL via INTRAVENOUS

## 2014-07-02 MED ORDER — ONDANSETRON HCL 4 MG/2ML IJ SOLN
INTRAMUSCULAR | Status: DC | PRN
  Administered 2014-07-02: 4 mg via INTRAVENOUS

## 2014-07-02 MED ORDER — PROPOFOL 10 MG/ML IV BOLUS
INTRAVENOUS | Status: DC | PRN
  Administered 2014-07-02: 180 mg via INTRAVENOUS

## 2014-07-02 MED ORDER — MIDAZOLAM HCL 5 MG/5ML IJ SOLN
INTRAMUSCULAR | Status: DC | PRN
  Administered 2014-07-02: 1 mg via INTRAVENOUS

## 2014-07-02 MED ORDER — FENTANYL CITRATE 0.05 MG/ML IJ SOLN
INTRAMUSCULAR | Status: DC | PRN
  Administered 2014-07-02 (×2): 50 ug via INTRAVENOUS
  Administered 2014-07-02: 25 ug via INTRAVENOUS
  Administered 2014-07-02: 50 ug via INTRAVENOUS
  Administered 2014-07-02: 25 ug via INTRAVENOUS
  Administered 2014-07-02: 50 ug via INTRAVENOUS
  Administered 2014-07-02: 25 ug via INTRAVENOUS

## 2014-07-02 MED ORDER — CARVEDILOL 6.25 MG PO TABS
6.2500 mg | ORAL_TABLET | Freq: Two times a day (BID) | ORAL | Status: DC
  Administered 2014-07-03 – 2014-07-05 (×5): 6.25 mg via ORAL
  Filled 2014-07-02 (×8): qty 1

## 2014-07-02 MED ORDER — PHENYLEPHRINE HCL 10 MG/ML IJ SOLN
10.0000 mg | INTRAMUSCULAR | Status: DC | PRN
  Administered 2014-07-02: 10 ug/min via INTRAVENOUS

## 2014-07-02 MED ORDER — CEFAZOLIN SODIUM-DEXTROSE 2-3 GM-% IV SOLR
2.0000 g | Freq: Once | INTRAVENOUS | Status: AC
  Administered 2014-07-02: 2 g via INTRAVENOUS
  Filled 2014-07-02: qty 50

## 2014-07-02 MED ORDER — ROCURONIUM BROMIDE 100 MG/10ML IV SOLN
INTRAVENOUS | Status: DC | PRN
  Administered 2014-07-02: 30 mg via INTRAVENOUS
  Administered 2014-07-02: 50 mg via INTRAVENOUS

## 2014-07-02 MED ORDER — LIDOCAINE HCL (CARDIAC) 20 MG/ML IV SOLN
INTRAVENOUS | Status: DC | PRN
Start: 2014-07-02 — End: 2014-07-02
  Administered 2014-07-02: 100 mg via INTRAVENOUS

## 2014-07-02 MED ORDER — GLYCOPYRROLATE 0.2 MG/ML IJ SOLN
INTRAMUSCULAR | Status: DC | PRN
  Administered 2014-07-02: .8 mg via INTRAVENOUS

## 2014-07-02 MED ORDER — LACTATED RINGERS IV SOLN
INTRAVENOUS | Status: DC | PRN
  Administered 2014-07-02 (×2): via INTRAVENOUS

## 2014-07-02 MED ORDER — LACTATED RINGERS IV SOLN
INTRAVENOUS | Status: DC | PRN
  Administered 2014-07-02 (×2): via INTRAVENOUS

## 2014-07-02 MED ORDER — LACTULOSE 10 GM/15ML PO SOLN
30.0000 g | Freq: Two times a day (BID) | ORAL | Status: DC
  Administered 2014-07-02 – 2014-07-04 (×4): 30 g via ORAL
  Filled 2014-07-02 (×7): qty 45

## 2014-07-02 NOTE — Progress Notes (Signed)
    Progress Note   Subjective   No events over night per nurse.  DOE improved per patient.  He had a normal BM yesterday and denies any more bleeding or pain.   Objective   Vital signs in last 24 hours: Temp:  [98.4 F (36.9 C)-99.8 F (37.7 C)] 98.5 F (36.9 C) (07/24 0733) Pulse Rate:  [77-86] 78 (07/24 0733) Resp:  [18-26] 19 (07/24 0733) BP: (100-124)/(58-83) 109/77 mmHg (07/24 0733) SpO2:  [98 %-100 %] 100 % (07/24 0733) Last BM Date: 07/01/14 General:   AA male in NAD Heart:  RRR, No MGR noted. Lungs: Mild bibasilar crackles, improved from yesterday. 100% on RA.  Unlabored and even. Abdomen:  Soft, NT. Mild distention. Normal bowel sounds. Extremities:  Without edema.  2+ DP and radial pulses. Neurologic:  Alert and oriented to person, place, and time.  Grossly normal neurologically. Psych:  Cooperative. Normal mood and affect.  Intake/Output from previous day: 07/23 0701 - 07/24 0700 In: 1884 [P.O.:950; I.V.:700] Out: 1375 [Urine:1375] Intake/Output this shift: Total I/O In: -  Out: 200 [Urine:200]  Lab Results:  Recent Labs  07/01/14 0247 07/02/14 0010 07/02/14 0331  WBC 7.1 7.0 7.7  HGB 9.2* 8.6* 8.8*  HCT 27.5* 26.1* 27.0*  PLT 54* 73* 74*   BMET  Recent Labs  06/30/14 0329 07/01/14 0247 07/02/14 0010  NA 146 141 142  K 2.7* 3.2* 3.4*  CL 102 101 102  CO2 32 28 28  GLUCOSE 91 82 184*  BUN 50* 49* 44*  CREATININE 2.15* 2.01* 1.96*  CALCIUM 7.0* 7.2* 7.3*   LFT  Recent Labs  07/02/14 0010  PROT 5.4*  ALBUMIN 1.8*  AST 110*  ALT 126*  ALKPHOS 147*  BILITOT 1.1   PT/INR  Recent Labs  07/02/14 0010  LABPROT 17.5*  INR 1.43        Assessment / Plan:   * LGI bleed. S/p ligation of bleeding rectal varices followed by surgical service, oversewn on 7/17. 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 On sbp prophylaxis. No longer on Octreotide, it was given only on 7/17.  * Cirrhosis,  shock liver, alcoholic hepatitis. LFTs steadily improving.  * Hepatitis C - New diagnosis this visit.  * ABL anemia. S/p multiple PRBC transfusions. Latest 7/18. Most recent Hgb is 9.2  * Coagulopathy. S/p multiple transfusions of FFP. Latest 7/19. Coags stable.  * AKI. Stage 3 CKD.  * Hypokalemia- Persistent despite potassium administration. Last 3.2 (07/01/14 @ 0247)  * resp failure, cardiac arrest 7/17. S/p intubation. CHF by imaging.  * Thrombocytopenia. Stable.  * Encephalopathy. Ammonia level of 35 on 06/24/14. Mental status improving.  * Umbilical hernia on CT. Chronic. 02/08/14 repair of incarcerated right inguinal hernia with mesh.   * TIPS scheduled for today at 1330 per nurse.   May need to increase Lactulose after procedure depending on how patient does.  Has had low ammonia levels thus far.   LOS: 8 days   Lollie Marrow  07/02/2014, 8:34 AM Lawrence Marseilles, [facility] Attending  I have also seen and assessed the patient and agree with the above note. I have personally seen the patient, reviewed and repeated key elements of the history and physical and participated in formation of the assessment and plan the student has documented.  Gatha Mayer, MD, [facility] [phone] (pager) 07/02/2014 4:00 PM

## 2014-07-02 NOTE — Progress Notes (Signed)
Pt rec'd from PACU via bed on monitor S/P Stenting of Portal Vein Coil Embolization of Portoiso System. Pt A&A. VS obtained. R neck dressing D&I.

## 2014-07-02 NOTE — Progress Notes (Signed)
Family Medicine Teaching Service Daily Progress Note Intern Pager: [phone]  Patient name: Edwin Love Medical record number: [id number] Date of birth: [birth year] Age: 53 y.o. Gender: male  Primary Care Provider: No primary provider on file. Consultants: PCCM, GI, IR Code Status: DNR  Pt Overview and Major Events to Date:  7/16: Pt presented with acute GI bleed with a Hb of 6.2. 2u pRBCs. EtOH 131 on arrival. Abd CT showed cirrhosis, multiple new hepatic lesions, partial thrombosis of portal vein, portal HTN with multiple varices, periportal adenopathy cannot be excluded, umbilical hernia with herniation of small bowel, extensive small bowel wall edema.  Endoscopy performed: varices however negative for upper GI bleed. Bleeding scan with a likely source in the sigmoid colon. 2uFFP and vitamin K given. Continued bleeding. 2u pRBCs and L IJ cordis placement. Right arterial line placement.  7/17: Intubated secondary to shock, AMS, and respiratory insufficiency. Levophed started. IR visceral arteriography of IMA, SMA, and left gastric artery: no acute arterial bleeding source found . Colonoscopy resulting in  cardiac arrest with PEA/Asystole with CPR. Emergency OR: Oversew of bleeding rectal varices. Ceftriaxone. Levophed started.  7/18: Worsening metabolic acidosis. HCO3 infusion increased. 1u pRBCs, 1u platelets, 4u FFP. Levophed discontinued in PM 7/19: 1u pRBC, 4 u FFP. Acute overcompensated met alkalosis. Bicarb GTT discontinued and vent settings changed. 7/20: Improving  7/21: Pt self-extubated, dysphagia 3 diet, transferred out of ICU 7/24: TIPs   Assessment and Plan: This is a 52 y/o male with a PMH of alcohol abuse presenting with copious BRBPR found to have large rectal varices s/p oversewing of those varices on 7/17  Acute lower GI bleed: Found to be secondary to rectal varices. This is most likely secondary to portal hypertension due to cirrhosis, most likely due to a  combination of alcohol abuse and HCV.  Over the course of his hospital stay, he's required numerous transfusions, the last on 7/19. The patient is POD 6 s/p oversewing of these varices without further bleeding noted.  - Hemoglobin stable at 8.8 - MELD score 17 - Protonix IV changed to oral protonix on 7/21 - Patient received octreotide on 7/17 - TIPS with obliteration or embolization of hemorrhoidal veins in IR 7/24: NPO p MN. Anticoagulants currently being held, will discuss when to re-start them as patient's platelet count continues to increase.   Cirrhosis of the liver: Most likely 2/2 to a long history of EtOH abuse. Patient also noted to have a new diagnosis of HCV (+Ab and RNA). -INR stable at 1.43 -AST (110) and ALT (126) improving considerable over hospital course - Albumin slightly decreased to 1.8  Hepatitis C: This is a new diagnosis for the patient. This could certainly be contributing to his liver disease.  - Consider infectious disease consult once patient more stable vs outpatient follow up   Encephalopathy: Ammonia: last was 35. Per mother, patient is at his baseline. Pt oriented to person, place, date, but not completely to situation. - Lactulose QD. - Continue to monitor mentation.  Acute kidney injury superimposed on CKD stage III: Baseline creatinine 0.9-1.4. On admission it was 1.6 but increased to 3.8, possibly due to hypoperfusion.  - Cr continues to improve. 1.96 this AM  Hypokalemia: Persistent since this admission. Possibly due to lactulose. K 3.4 this AM. - Replete with KDUR 55mEq this AM - Continue to monitor   Thrombocytopenia: Most likely 2/2 liver failure. No signs of active bleeding currently.  - Platelet improved at 74 today. - Will continue to  monitor and transfuse as necessary   History of Respiratory failure: Intubated on 7/17, self-extubated on 7/21.  -Breathing well on RA.  - CXR on 7/22 shows interim clearing of pulmonary edema.  -Continue to  monitor and provide O2 to maintain sats >92%  New hepatic lesions noted on CT: Concerning for hepatocellular carcinoma as AFP was 2.8. Other DDx include regenerating nodules from cirrhosis vs metastatic malignancy. - If he improves during this hospitalization, consider work up as an outpatient.   Diastolic HF: Echocardiogram showed EF of 55-60% with grade 2 diastolic dysfunction -Will be careful not to use too many IVFs. -Currently does not appear fluid overloaded.  Hypertension: Pt has been mostly hypotensive during this hospitalization. Currently 105/75. Pt has be asymptomatic  -Holding amlodipine currently - Holding lisnopril due to kidney function - May hold coreg if he continues to be hypotensive - Continue to monitor   Umbilical hernia: Note on CT. Pt underwent a repair of a right incarcerated inguinal hernia with mesh on 02/08/14 - Will continue to monitor for symptoms of incarceration.   FEN/GI: Dysphagia 3, NPO current for TIPS / D5 @ 50cc/hr (will discontinue after procedure if good PO intake) PPx: None currently   Disposition: Pending TIPs procedure. CIR to evaluate today vs SNF  Subjective:  Pt with yellow stools this AM. No bleeding. No abdominal pain. No SOB or chest pain  Objective: Temp:  [98.4 F (36.9 C)-99.8 F (37.7 C)] 98.4 F (36.9 C) (07/24 0300) Pulse Rate:  [78-86] 78 (07/24 0300) Resp:  [18-26] 18 (07/24 0300) BP: (100-124)/(58-83) 105/75 mmHg (07/24 0300) SpO2:  [98 %-100 %] 98 % (07/24 0300) Physical Exam: General: 53 y.o. male in NAD sitting up in bed HEENT: NCAT, Right eye reactive to light, enucleated left eye.  Oropharynx clear, MMM  Cardiovascular: Regular rate and rhythm. No m/r/g noted. Capillary refill <3sec Respiratory: Non-labored, 99% on RA. Bibasilar crackles. Abdomen: +BS, NT, ND, soft. Negative fluid wave.  Extremities: No obvious deformities  Skin: No rashes noted  Neuro: Alert. Oriented to person, place, and date. No completely to  situation.  Laboratory:  Recent Labs Lab 07/01/14 0247 07/02/14 0010 07/02/14 0331  WBC 7.1 7.0 7.7  HGB 9.2* 8.6* 8.8*  HCT 27.5* 26.1* 27.0*  PLT 54* 73* 74*    Recent Labs Lab 06/29/14 0410  06/30/14 0329 07/01/14 0247 07/02/14 0010  NA 147  < > 146 141 142  K 2.7*  < > 2.7* 3.2* 3.4*  CL 100  < > 102 101 102  CO2 31  < > 32 28 28  BUN 55*  < > 50* 49* 44*  CREATININE 2.69*  < > 2.15* 2.01* 1.96*  CALCIUM 6.3*  < > 7.0* 7.2* 7.3*  PROT 5.5*  --  5.5*  --  5.4*  BILITOT 1.6*  --  1.6*  --  1.1  ALKPHOS 115  --  131*  --  147*  ALT 326*  --  250*  --  126*  AST 644*  --  365*  --  110*  GLUCOSE 147*  < > 91 82 184*  < > = values in this interval not displayed.  RPR neg Heb B Ab neg HCV Ab Reactive, HCV RNA positive  HIV non-reactive   Imaging/Diagnostic Tests: 7/16 Abd KW:IOXBDZHGD, multiple new hepatic lesions are noted, partial thrombosis of portal vein, portal HTN with multiple varices, periportal adenopathy cannot be excluded, umbilical hernia with herniation of small bowel, extensive small bowel wall edema.  7/16 EGD: varices present; however, no acute bleeding.   7/16 Bleeding Scan:positive for acute lower GI bleed with source likely within the sigmoid colon.   7/17 Colonoscopy: Large varix with ulceration of the tip in the rectal vault. Clips placed and injection of ethanolamine, however study was stopped due to hypotension and arrhythmia.   7/20 CXR: Line and tube positions stable. Congestive heart failure with pulmonary edema.  7/22 CXR: Interim removal of lines and tubes. Interim partial clearing of pulmonary edema.    Archie Patten, MD 07/02/2014, 6:35 AM PGY-1, [place] Intern pager: [phone], text pages welcome

## 2014-07-02 NOTE — Anesthesia Postprocedure Evaluation (Signed)
Anesthesia Post Note  Patient: Edwin Love  Procedure(s) Performed: Procedure(s) (LRB): RADIOLOGY WITH ANESTHESIA (N/A)  Anesthesia type: general  Patient location: PACU  Post pain: Pain level controlled  Post assessment: Patient's Cardiovascular Status Stable  Last Vitals:  Filed Vitals:   07/02/14 2200  BP: 158/81  Pulse: 85  Temp:   Resp: 29    Post vital signs: Reviewed and stable  Level of consciousness: sedated  Complications: No apparent anesthesia complications

## 2014-07-02 NOTE — Progress Notes (Signed)
Rehab admissions - I met with pt and his mother/dtr in follow up to rehab MD consult to explain the possibility of inpatient rehab. Pt had flat affect and kept his eyes mainly closed. Pt's mother and dtr answered most of my questions. Pt expressed his desire to go home but was agreeable to pursuing inpatient rehab. Both mother and dtr stated pt would benefit from inpatient rehab. Pt lives with his parents and they can provide any needed supervision.  Further questions were answered and informational brochures were given. Pt to have TIPS procedure later today. I will follow pt's case and check on pt's status on Monday.  Please call me with any questions. Thanks.  Nanetta Batty, PT Rehabilitation Admissions Coordinator [phone]

## 2014-07-02 NOTE — Procedures (Signed)
Interventional Radiology Procedure Note  Procedure: - TIPS  - Stenting of main portal vein - Coil embolization of porto-systemic shunt  Complications: None immediate Recommendations: - Bedrest overnight - Continue lactulose, watch for encephalopathy - Watch renal function for CIN - peak effect at 48 hrs - Labs in am   Signed,  Criselda Peaches, MD [facility]

## 2014-07-02 NOTE — Anesthesia Preprocedure Evaluation (Signed)
Anesthesia Evaluation  Patient identified by MRN, date of birth, ID band Patient awake    Reviewed: Allergy & Precautions, H&P , NPO status , Patient's Chart, lab work & pertinent test results, reviewed documented beta blocker date and time   Airway Mallampati: II TM Distance: >3 FB Neck ROM: full    Dental   Pulmonary shortness of breath and with exertion, Current Smoker,  breath sounds clear to auscultation        Cardiovascular hypertension, Pt. on medications and Pt. on home beta blockers Rhythm:regular     Neuro/Psych PSYCHIATRIC DISORDERS Depression    GI/Hepatic negative GI ROS, (+) Cirrhosis -  Esophageal Varices and ascites  substance abuse  alcohol use, cocaine use and marijuana use, Hepatitis -, C  Endo/Other  negative endocrine ROS  Renal/GU Renal InsufficiencyRenal disease  negative genitourinary   Musculoskeletal   Abdominal   Peds  Hematology negative hematology ROS (+) anemia ,   Anesthesia Other Findings See surgeon's H&P   Reproductive/Obstetrics negative OB ROS                           Anesthesia Physical Anesthesia Plan  ASA: IV  Anesthesia Plan: General   Post-op Pain Management:    Induction: Intravenous  Airway Management Planned: Oral ETT  Additional Equipment:   Intra-op Plan:   Post-operative Plan: Extubation in OR  Informed Consent: I have reviewed the patients History and Physical, chart, labs and discussed the procedure including the risks, benefits and alternatives for the proposed anesthesia with the patient or authorized representative who has indicated his/her understanding and acceptance.   Dental Advisory Given  Plan Discussed with: CRNA and Surgeon  Anesthesia Plan Comments:         Anesthesia Quick Evaluation

## 2014-07-02 NOTE — Progress Notes (Signed)
Physical Therapy Treatment Patient Details Name: Edwin Love MRN: [id number] DOB: 1961/04/08 Today's Date: 07/02/2014    History of Present Illness Adm 06/24/14 after found down in pool of blood. +rectal varices bleeding with oversew surgery 07/17/23 (coded during colonoscopy prior to surgery with CPR), intubated 06/2020-08-07 (self-extubated). PMHx- ETOH abuse, cirrhosis, coagulopathy, Lt ankle fx    PT Comments    Pt less interactive today; more lethargic vs flat affect. Participating with LE exercises when he suddenly felt need to have a BM and became impulsive and quite unsafe with his mobility. Required max directional cues (step by step) for transfer to [facility] and to bed. Pt has had a significant functional decline from his baseline.   Follow Up Recommendations  CIR;Supervision/Assistance - 24 hour     Equipment Recommendations  Rolling walker with 5" wheels    Recommendations for Other Services OT consult     Precautions / Restrictions Precautions Precautions: Fall    Mobility  Bed Mobility Overal bed mobility: Needs Assistance Bed Mobility: Rolling;Sidelying to Sit;Sit to Supine Rolling: Supervision Sidelying to sit: Min assist   Sit to supine: Min assist   General bed mobility comments: pt suddenly needed to go to [facility] and began coming to EOB with no regard for his lines/tubes; HOB 20 with rail  Transfers Overall transfer level: Needs assistance Equipment used: None Transfers: Stand Pivot Transfers Sit to Stand: Mod assist;Min assist Stand pivot transfers: Mod assist;Min assist       General transfer comment: requires assist to complete lift off from surface through full standing; first transfer to [facility] required less help (min), on return pt fatigued and required mod assist  Ambulation/Gait             General Gait Details: unable due to fatigue/safety concerns   Stairs            Wheelchair Mobility    Modified Rankin (Stroke Patients  Only)       Balance   Sitting-balance support: Bilateral upper extremity supported;Feet supported Sitting balance-Leahy Scale: Poor     Standing balance support: Single extremity supported Standing balance-Leahy Scale: Poor                      Cognition Arousal/Alertness: Lethargic Behavior During Therapy: Flat affect Overall Cognitive Status: Impaired/Different from baseline Area of Impairment: Problem solving;Attention;Safety/judgement;Awareness   Current Attention Level: Selective     Safety/Judgement: Decreased awareness of safety;Decreased awareness of deficits Awareness: Intellectual Problem Solving: Slow processing;Decreased initiation General Comments: remains impulsive with poor safety awareness (especially of multiple lines/tubes)    Exercises General Exercises - Lower Extremity Straight Leg Raises: Both;5 reps;Supine Low Level/ICU Exercises Stabilized Bridging: 5 reps;Supine    General Comments        Pertinent Vitals/Pain Denied pain; VSS on ICU monitor    Home Living                      Prior Function            PT Goals (current goals can now be found in the care plan section) Acute Rehab PT Goals Patient Stated Goal: agrees wants to get stronger Progress towards PT goals: Progressing toward goals    Frequency  Min 3X/week    PT Plan Current plan remains appropriate    Co-evaluation             End of Session Equipment Utilized During Treatment: Gait belt Activity Tolerance: Patient limited  by fatigue;Patient limited by lethargy Patient left: with call bell/phone within reach;with family/visitor present;in bed     Time: 9150-4136 PT Time Calculation (min): 21 min  Charges:  $Therapeutic Activity: 8-22 mins                    G Codes:      Edwin Love Jul 28, 2014, 1:09 PM 2014-07-28  Pager [phone]

## 2014-07-02 NOTE — Progress Notes (Signed)
FMTS Attending Daily Note:  Edwin Sabal MD  [phone] pager  Family Practice pager:  [phone] I have discussed this patient with the resident Dr. Lorenso Courier.  I agree with their findings, assessment, and care plan.    Unable to examine patient as he was taken done for TIPS procedures.  Question is where he will go for disposition.

## 2014-07-02 NOTE — Transfer of Care (Signed)
Immediate Anesthesia Transfer of Care Note  Patient: Edwin Love  Procedure(s) Performed: Procedure(s): RADIOLOGY WITH ANESTHESIA (N/A)  Patient Location: PACU  Anesthesia Type:General  Level of Consciousness: awake and sedated  Airway & Oxygen Therapy: Patient connected to face mask oxygen  Post-op Assessment: Report given to PACU RN, Post -op Vital signs reviewed and stable and Patient moving all extremities X 4  Post vital signs: Reviewed and stable  Complications: No apparent anesthesia complications

## 2014-07-02 NOTE — H&P (Signed)
Pt seen and examined today.  Agree with PA note.  Proceed as planned with TIPS and balloon-occluded obliteration of rectal varices vs. coil embolization of afferent portosystemic shunt.    Signed,  Criselda Peaches, MD [facility]

## 2014-07-02 NOTE — Progress Notes (Signed)
SLP Cancellation Note  Patient Details Name: PRISCILLA FINKLEA MRN: [id number] DOB: 22-May-1961     Cancelled treatment:        Pt. NPO for surgery today.  Will continue to treat and diagnostically assess for upgrade to thin liquids.   Orbie Pyo [place].Ed [employer] [phone]   07/02/2014

## 2014-07-03 LAB — CBC
HEMATOCRIT: 26 % — AB (ref 39.0–52.0)
Hemoglobin: 8.6 g/dL — ABNORMAL LOW (ref 13.0–17.0)
MCH: 30.2 pg (ref 26.0–34.0)
MCHC: 33.1 g/dL (ref 30.0–36.0)
MCV: 91.2 fL (ref 78.0–100.0)
PLATELETS: 112 10*3/uL — AB (ref 150–400)
RBC: 2.85 MIL/uL — AB (ref 4.22–5.81)
RDW: 15.6 % — ABNORMAL HIGH (ref 11.5–15.5)
WBC: 14.3 10*3/uL — ABNORMAL HIGH (ref 4.0–10.5)

## 2014-07-03 LAB — COMPREHENSIVE METABOLIC PANEL
ALT: 68 U/L — ABNORMAL HIGH (ref 0–53)
AST: 135 U/L — AB (ref 0–37)
Albumin: 1.8 g/dL — ABNORMAL LOW (ref 3.5–5.2)
Alkaline Phosphatase: 107 U/L (ref 39–117)
Anion gap: 13 (ref 5–15)
BILIRUBIN TOTAL: 4.2 mg/dL — AB (ref 0.3–1.2)
BUN: 37 mg/dL — AB (ref 6–23)
CHLORIDE: 105 meq/L (ref 96–112)
CO2: 26 meq/L (ref 19–32)
Calcium: 7.8 mg/dL — ABNORMAL LOW (ref 8.4–10.5)
Creatinine, Ser: 1.73 mg/dL — ABNORMAL HIGH (ref 0.50–1.35)
GFR calc Af Amer: 50 mL/min — ABNORMAL LOW (ref >90)
GFR, EST NON AFRICAN AMERICAN: 43 mL/min — AB (ref >90)
Glucose, Bld: 105 mg/dL — ABNORMAL HIGH (ref 70–99)
Potassium: 3.9 mEq/L (ref 3.7–5.3)
Sodium: 144 mEq/L (ref 137–147)
Total Protein: 5 g/dL — ABNORMAL LOW (ref 6.0–8.3)

## 2014-07-03 LAB — PROTIME-INR
INR: 1.42 (ref 0.00–1.49)
Prothrombin Time: 17.4 seconds — ABNORMAL HIGH (ref 11.6–15.2)

## 2014-07-03 LAB — GLUCOSE, CAPILLARY
GLUCOSE-CAPILLARY: 172 mg/dL — AB (ref 70–99)
GLUCOSE-CAPILLARY: 165 mg/dL — AB (ref 70–99)
Glucose-Capillary: 148 mg/dL — ABNORMAL HIGH (ref 70–99)
Glucose-Capillary: 131 mg/dL — ABNORMAL HIGH (ref 70–99)

## 2014-07-03 MED ORDER — PANTOPRAZOLE SODIUM 40 MG PO TBEC
40.0000 mg | DELAYED_RELEASE_TABLET | Freq: Every day | ORAL | Status: DC
  Administered 2014-07-03 – 2014-07-05 (×3): 40 mg via ORAL
  Filled 2014-07-03 (×3): qty 1

## 2014-07-03 NOTE — Progress Notes (Signed)
Family Medicine Teaching Service Daily Progress Note Intern Pager: [phone]  Patient name: Edwin Love Medical record number: [id number] Date of birth: 1961/02/19 Age: 53 y.o. Gender: male  Primary Care Provider: No primary provider on file. Consultants: PCCM, GI, IR Code Status: DNR  Pt Overview and Major Events to Date:  7/16: Pt presented with acute GI bleed with a Hb of 6.2. 2u pRBCs. EtOH 131 on arrival. Abd CT showed cirrhosis, multiple new hepatic lesions, partial thrombosis of portal vein, portal HTN with multiple varices, periportal adenopathy cannot be excluded, umbilical hernia with herniation of small bowel, extensive small bowel wall edema.  Endoscopy performed: varices however negative for upper GI bleed. Bleeding scan with a likely source in the sigmoid colon. 2uFFP and vitamin K given. Continued bleeding. 2u pRBCs and L IJ cordis placement. Right arterial line placement.  7/17: Intubated secondary to shock, AMS, and respiratory insufficiency. Levophed started. IR visceral arteriography of IMA, SMA, and left gastric artery: no acute arterial bleeding source found . Colonoscopy resulting in  cardiac arrest with PEA/Asystole with CPR. Emergency OR: Oversew of bleeding rectal varices. Ceftriaxone. Levophed started.  7/18: Worsening metabolic acidosis. HCO3 infusion increased. 1u pRBCs, 1u platelets, 4u FFP. Levophed discontinued in PM 7/19: 1u pRBC, 4 u FFP. Acute overcompensated met alkalosis. Bicarb GTT discontinued and vent settings changed. 7/20: Improving  7/21: Pt self-extubated, dysphagia 3 diet, transferred out of ICU 7/24: TIPs  Assessment and Plan: 53 y/o male with a PMH of alcohol abuse presenting with copious BRBPR found to have large rectal varices s/p oversewing of those varices (7/17).  TIPs procedure on 7/24.  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 POD # 8 s/p ligation  of rectal varices (surgery now signed off).  S/P TIPs on 7/24. - No further bleeding.  Hemoglobin stable @ 8.6 - GI following, appreciate recs.  Continue PO protonix. - Will continue to trend Hb daily and monitor closely   Cirrhosis of the liver:  Secondary to EtOH abuse. Patient also noted to have a new diagnosis of HCV (+Ab and RNA). - INR 1.42 - AST/ALT stable/trending downward. - Continue Thiamine/Folic acid in setting of known ETOH abuse. - Continue Coreg for varices.    Leukocytosis - WBC count elevated this am at 14.3 - No current signs of infection and patient afebrile. - Likely due to stress from recent procedure. - Will continue to monitor/trend  Hepatitis C: New diagnosis - ID Consult likely in outpatient setting regarding treatment options.  Encephalopathy - Currently resolved - Lactulose BID.  - Will add rifaximin if needed. - Continue to monitor closely.  Acute kidney injury superimposed on CKD stage III: Baseline creatinine 0.9-1.4. On admission it was 1.6 but increased to max of 3.8 in setting of shock. - Continuing to improve. Down to 1.73 this am. - Will continue to trend.  Hypokalemia - Resolved. K+ 3.9 this am. - Will continue to monitor.  Thrombocytopenia: Secondary to liver disease. - Currently improving.  74 >>>112 - Continue to trend.  New hepatic lesions noted on CT: Concerning for hepatocellular carcinoma as AFP was 2.8.  - Workup/follow up in outpatient setting.   Diastolic HF: Echocardiogram showed EF of 55-60% with grade 2 diastolic dysfunction - Currently euvolemic. Will continue to assess volume status daily.  Hypertension: - BP well controlled currently - 127/69. - Continuing coreg.  FEN/GI: 2 gram sodium diet PPx: SCD's; Holding anticoagulants in  setting of prior bleed  Disposition: Planning CIR when stable.  Subjective:  Reports that he is tired this am. No other complaints this am.  Objective: Temp:  [98.2 F (36.8 C)-99.4  F (37.4 C)] 99.4 F (37.4 C) (07/25 0400) Pulse Rate:  [73-91] 91 (07/25 0400) Resp:  [13-31] 29 (07/25 0400) BP: (109-172)/(69-93) 127/69 mmHg (07/25 0400) SpO2:  [97 %-100 %] 99 % (07/25 0400)  Physical Exam: General: resting in bed, NAD. Pleasant and cooperative. Cardiovascular: Regular rate and rhythm. 2/6 Systolic murmur noted. Respiratory: CTAB. No rales, rhonchi or wheezing noted. Abdomen: soft, nontender, nondistended.  Extremities: No LE edema.  Skin: No rashes noted  Neuro: AO x 3. No focal deficits.  Laboratory:  Recent Labs Lab 07/02/14 0010 07/02/14 0331 07/03/14 0255  WBC 7.0 7.7 14.3*  HGB 8.6* 8.8* 8.6*  HCT 26.1* 27.0* 26.0*  PLT 73* 74* 112*    Recent Labs Lab 06/30/14 0329 07/01/14 0247 07/02/14 0010 07/03/14 0255  NA 146 141 142 144  K 2.7* 3.2* 3.4* 3.9  CL 102 101 102 105  CO2 32 $Remo'28 28 26  'ywRQV$ BUN 50* 49* 44* 37*  CREATININE 2.15* 2.01* 1.96* 1.73*  CALCIUM 7.0* 7.2* 7.3* 7.8*  PROT 5.5*  --  5.4* 5.0*  BILITOT 1.6*  --  1.1 4.2*  ALKPHOS 131*  --  147* 107  ALT 250*  --  126* 68*  AST 365*  --  110* 135*  GLUCOSE 91 82 184* 105*    RPR neg Heb B Ab neg HCV Ab Reactive, HCV RNA positive  HIV non-reactive   Imaging/Diagnostic Tests: 7/16 Abd PZ:WCHENIDPO, multiple new hepatic lesions are noted, partial thrombosis of portal vein, portal HTN with multiple varices, periportal adenopathy cannot be excluded, umbilical hernia with herniation of small bowel, extensive small bowel wall edema.   7/16 EGD: varices present; however, no acute bleeding.   7/16 Bleeding Scan:positive for acute lower GI bleed with source likely within the sigmoid colon.   7/17 Colonoscopy: Large varix with ulceration of the tip in the rectal vault. Clips placed and injection of ethanolamine, however study was stopped due to hypotension and arrhythmia.   7/20 CXR: Line and tube positions stable. Congestive heart failure with pulmonary edema.  7/22 CXR: Interim  removal of lines and tubes. Interim partial clearing of pulmonary edema.  [place] PGY-3 Contact: [place] Intern pager [phone], text pages welcome

## 2014-07-03 NOTE — Progress Notes (Signed)
07/03/2014 patient foley cath was removed, orders were given by Dr Lacinda Axon (Internal Medicine). Urine was amber color and patient tolerate foley well. Patient had 250 cc from foley cath. [facility] RN.

## 2014-07-03 NOTE — Progress Notes (Signed)
Speech Language Pathology Treatment: Dysphagia  Patient Details Name: Edwin Love MRN: [id number] DOB: 03-06-1961 Today's Date: 07/03/2014 Time: 2060-1561 SLP Time Calculation (min): 15 min  Assessment / Plan / Recommendation Clinical Impression  Pt seen to assess tolerance of diet, education to aspiration precautions and readiness for dietary advancement.  Pt's diet was advanced to 2gm sodium per GI MD order.  Half consumed breakfast tray on table and pt declined to consume any more solids.  Significantly flat affect noted during today's session.  Pt observed consuming coffee via cup and straw- benefited for min cues to take small boluses.  Cough immediately after swallow from straw noted but not from straw - suspect due to inhalation after swallow.  SLP educated pt to cease using straw if causing coughing due to concern for aspiration.    Note per pt, he does have some premorbid occasional cough with intake (*solid and liquids).  Swallow likely to be at baseline function given pt is 4 days post extubation and appears with strong voice.  Last CXR 7/22 showed partial clearing of pulmonary edema.  Agree with recommendation for advancement - continue medicine with applesauce - pt agreeable.  Will sign off, please reorder if desire.    HPI HPI: 53 y.o. M with cirrhosis and esophageal varices brought to ED after being found lying in pool of blood by family. Underwent EGD 7/16 which was negative for UGIB; however, pt continued to have massive hematochezia. Bleeding scan positive for LGIB in sigmoid colon. Underwent colonoscopy  and had /cardiac arrest w/ PEA/CPR. Surgery for large rectal varices on 7/17. Pt intubated from 7/17 to 7/21, self extubated overnight.   Pt has been seen for swallowing, today seen to assess readiness for dietary advancement.     Pertinent Vitals Low grade fever, lungs clear  SLP Plan  All goals met    Recommendations Diet recommendations: Regular;Thin liquid Liquids  provided via: Cup Medication Administration: Whole meds with puree Supervision: Intermittent supervision to cue for compensatory strategies;Patient able to self feed Compensations: Slow rate;Small sips/bites Postural Changes and/or Swallow Maneuvers: Seated upright 90 degrees              Oral Care Recommendations: Oral care BID Follow up Recommendations: 24 hour supervision/assistance Plan: All goals met    GO     Claudie Fisherman, [facility] SLP [phone]

## 2014-07-03 NOTE — Progress Notes (Signed)
Subjective: No complaints.  Feeling well.  Objective: Vital signs in last 24 hours: Temp:  [98.2 F (36.8 C)-99.4 F (37.4 C)] 99.4 F (37.4 C) (07/25 0400) Pulse Rate:  [73-91] 91 (07/25 0400) Resp:  [13-31] 29 (07/25 0400) BP: (109-172)/(69-93) 127/69 mmHg (07/25 0400) SpO2:  [97 %-100 %] 99 % (07/25 0400) Last BM Date: 07/01/14  Intake/Output from previous day: 07/24 0701 - 07/25 0700 In: 3893 [P.O.:440; I.V.:2953; IV Piggyback:500] Out: 3020 [Urine:3015; Blood:5] Intake/Output this shift: Total I/O In: 443 [P.O.:440; I.V.:3] Out: 2000 [Urine:2000]  General appearance: alert and no distress GI: soft, non-tender; bowel sounds normal; no masses,  no organomegaly  Lab Results:  Recent Labs  07/02/14 0010 07/02/14 0331 07/03/14 0255  WBC 7.0 7.7 14.3*  HGB 8.6* 8.8* 8.6*  HCT 26.1* 27.0* 26.0*  PLT 73* 74* 112*   BMET  Recent Labs  07/01/14 0247 07/02/14 0010 07/03/14 0255  NA 141 142 144  K 3.2* 3.4* 3.9  CL 101 102 105  CO2 28 28 26   GLUCOSE 82 184* 105*  BUN 49* 44* 37*  CREATININE 2.01* 1.96* 1.73*  CALCIUM 7.2* 7.3* 7.8*   LFT  Recent Labs  07/03/14 0255  PROT 5.0*  ALBUMIN 1.8*  AST 135*  ALT 68*  ALKPHOS 107  BILITOT 4.2*   PT/INR  Recent Labs  07/02/14 0010 07/03/14 0255  LABPROT 17.5* 17.4*  INR 1.43 1.42   Hepatitis Panel No results found for this basename: HEPBSAG, HCVAB, HEPAIGM, HEPBIGM,  in the last 72 hours C-Diff No results found for this basename: CDIFFTOX,  in the last 72 hours Fecal Lactopherrin No results found for this basename: FECLLACTOFRN,  in the last 72 hours  Studies/Results: No results found.  Medications:  Scheduled: . antiseptic oral rinse  15 mL Mouth Rinse q12n4p  . carvedilol  6.25 mg Oral BID WC  . chlorhexidine  15 mL Mouth Rinse BID  . feeding supplement (GLUCERNA SHAKE)  237 mL Oral BID BM  . folic acid  1 mg Oral Daily  . insulin aspart  0-9 Units Subcutaneous TID AC & HS  . lactulose   30 g Oral BID  . pantoprazole  40 mg Oral Q0600  . sodium chloride  3 mL Intravenous Q12H  . thiamine  100 mg Oral Daily   Continuous: . dextrose 50 mL/hr at 07/01/14 2006    Assessment/Plan: 1) Cirrhosis with portal HTN s/p TIPS. 2) ETOH abuse. 3) Rectal varices s/p ligation after hematochezia. 4) HCV. 5) Encephalopathy.   The patient is stable after his TIPS, but Nursing does reports a slight temp at 99 F.  His WBC is also elevated.  No clear evidence of hepatic encephalopathy. Overall he appears to be well.  Plan: 1) Follow mental status. 2) Continue with lactulose. 3) Advance diet to 2 grams sodium diet. 4) Monitor for any overt signs of infection.  LOS: 9 days   Edwin Love 07/03/2014, 6:30 AM

## 2014-07-03 NOTE — Progress Notes (Signed)
Subjective: Pt awake and alert this am. Says he feels pretty good. Denies N/V, abd pain Denies further bleeding.  Objective: Physical Exam: BP 116/60  Pulse 88  Temp(Src) 98.8 F (37.1 C) (Oral)  Resp 27  Ht 5\' 9"  (1.753 m)  Wt 207 lb 3.7 oz (94 kg)  BMI 30.59 kg/m2  SpO2 100% Awake, alert, NAD Neck: supple, (R)IJ site soft, no hematoma. Lugs: CTA Heart: Reg Abd: soft, NT, ND.   Labs: CBC  Recent Labs  07/02/14 0331 07/03/14 0255  WBC 7.7 14.3*  HGB 8.8* 8.6*  HCT 27.0* 26.0*  PLT 74* 112*   BMET  Recent Labs  07/02/14 0010 07/03/14 0255  NA 142 144  K 3.4* 3.9  CL 102 105  CO2 28 26  GLUCOSE 184* 105*  BUN 44* 37*  CREATININE 1.96* 1.73*  CALCIUM 7.3* 7.8*   LFT  Recent Labs  07/03/14 0255  PROT 5.0*  ALBUMIN 1.8*  AST 135*  ALT 68*  ALKPHOS 107  BILITOT 4.2*   PT/INR  Recent Labs  07/02/14 0010 07/03/14 0255  LABPROT 17.5* 17.4*  INR 1.43 1.42     Studies/Results: No results found.  Assessment/Plan: S/p TIPS, extended stenting of main portal vein, and coil embolization of mesenteric varices. Renal function stable so far. Bump in Bili to 4.2, likely transient post procedure. Continue to follow closely. D/w Dr. Annamaria Boots   LOS: 9 days    Ascencion Dike PA-C 07/03/2014 10:04 AM

## 2014-07-03 NOTE — Progress Notes (Signed)
Dr. Benson Norway in to see and evaluate pt. Updated on pt's status. New orders received and acknowledged.

## 2014-07-03 NOTE — Progress Notes (Signed)
FMTS Attending Daily Note:  Annabell Sabal MD  [phone] pager  Family Practice pager:  [phone] I have seen and examined this patient and have reviewed their chart. I have discussed this patient with the resident. I agree with the resident's findings, assessment and care plan.  Additionally:  - Subjectively well today.  - Pain controlled.  No further bleeding - appreciate IR/GI help with management and TIPS procedure - question is when and if he will be ready for anticoagulation for partial hepatic thrombosis.  Will need to touch base with GI about this today.   Alveda Reasons, MD 07/03/2014 1:04 PM

## 2014-07-04 DIAGNOSIS — F17200 Nicotine dependence, unspecified, uncomplicated: Secondary | ICD-10-CM

## 2014-07-04 LAB — COMPREHENSIVE METABOLIC PANEL
ALBUMIN: 1.6 g/dL — AB (ref 3.5–5.2)
ALK PHOS: 103 U/L (ref 39–117)
ALT: 36 U/L (ref 0–53)
ANION GAP: 13 (ref 5–15)
AST: 62 U/L — ABNORMAL HIGH (ref 0–37)
BILIRUBIN TOTAL: 1.9 mg/dL — AB (ref 0.3–1.2)
BUN: 28 mg/dL — ABNORMAL HIGH (ref 6–23)
CHLORIDE: 105 meq/L (ref 96–112)
CO2: 25 mEq/L (ref 19–32)
Calcium: 7.4 mg/dL — ABNORMAL LOW (ref 8.4–10.5)
Creatinine, Ser: 1.65 mg/dL — ABNORMAL HIGH (ref 0.50–1.35)
GFR calc non Af Amer: 46 mL/min — ABNORMAL LOW (ref >90)
GFR, EST AFRICAN AMERICAN: 53 mL/min — AB (ref >90)
GLUCOSE: 117 mg/dL — AB (ref 70–99)
POTASSIUM: 3.5 meq/L — AB (ref 3.7–5.3)
Sodium: 143 mEq/L (ref 137–147)
Total Protein: 4.8 g/dL — ABNORMAL LOW (ref 6.0–8.3)

## 2014-07-04 LAB — CBC
HCT: 25.4 % — ABNORMAL LOW (ref 39.0–52.0)
HEMATOCRIT: 23.5 % — AB (ref 39.0–52.0)
Hemoglobin: 7.8 g/dL — ABNORMAL LOW (ref 13.0–17.0)
Hemoglobin: 8.4 g/dL — ABNORMAL LOW (ref 13.0–17.0)
MCH: 29.3 pg (ref 26.0–34.0)
MCH: 29.8 pg (ref 26.0–34.0)
MCHC: 33.2 g/dL (ref 30.0–36.0)
MCHC: 33.1 g/dL (ref 30.0–36.0)
MCV: 88.3 fL (ref 78.0–100.0)
MCV: 90.1 fL (ref 78.0–100.0)
PLATELETS: 114 10*3/uL — AB (ref 150–400)
Platelets: 111 10*3/uL — ABNORMAL LOW (ref 150–400)
RBC: 2.66 MIL/uL — ABNORMAL LOW (ref 4.22–5.81)
RBC: 2.82 MIL/uL — ABNORMAL LOW (ref 4.22–5.81)
RDW: 15.7 % — AB (ref 11.5–15.5)
RDW: 15.6 % — AB (ref 11.5–15.5)
WBC: 16 10*3/uL — AB (ref 4.0–10.5)
WBC: 16.1 10*3/uL — ABNORMAL HIGH (ref 4.0–10.5)

## 2014-07-04 LAB — GLUCOSE, CAPILLARY
GLUCOSE-CAPILLARY: 133 mg/dL — AB (ref 70–99)
GLUCOSE-CAPILLARY: 166 mg/dL — AB (ref 70–99)
GLUCOSE-CAPILLARY: 122 mg/dL — AB (ref 70–99)
GLUCOSE-CAPILLARY: 128 mg/dL — AB (ref 70–99)
GLUCOSE-CAPILLARY: 112 mg/dL — AB (ref 70–99)

## 2014-07-04 MED ORDER — POTASSIUM CHLORIDE CRYS ER 20 MEQ PO TBCR
40.0000 meq | EXTENDED_RELEASE_TABLET | Freq: Once | ORAL | Status: AC
  Administered 2014-07-04: 40 meq via ORAL
  Filled 2014-07-04: qty 2

## 2014-07-04 NOTE — Progress Notes (Signed)
Subjective: He feels an odd sensation in his right side with some movement.  Overall he feels well.  He wants to go home.  Objective: Vital signs in last 24 hours: Temp:  [98 F (36.7 C)-99.7 F (37.6 C)] 98.7 F (37.1 C) (07/26 0736) Pulse Rate:  [81-86] 84 (07/26 0736) Resp:  [20-35] 20 (07/26 0736) BP: (112-124)/(57-67) 124/64 mmHg (07/26 0736) SpO2:  [97 %-100 %] 100 % (07/26 0736) Weight:  [212 lb 11.9 oz (96.5 kg)] 212 lb 11.9 oz (96.5 kg) (07/26 0421) Last BM Date: 07/03/14  Intake/Output from previous day: 07/25 0701 - 07/26 0700 In: 243 [P.O.:240; I.V.:3] Out: 1675 [Urine:1675] Intake/Output this shift: Total I/O In: -  Out: 200 [Urine:200]  General appearance: alert and no distress GI: soft, non-tender; bowel sounds normal; no masses,  no organomegaly  Lab Results:  Recent Labs  07/02/14 0331 07/03/14 0255 07/04/14 0318  WBC 7.7 14.3* 16.0*  HGB 8.8* 8.6* 7.8*  HCT 27.0* 26.0* 23.5*  PLT 74* 112* 111*   BMET  Recent Labs  07/02/14 0010 07/03/14 0255 07/04/14 0318  NA 142 144 143  K 3.4* 3.9 3.5*  CL 102 105 105  CO2 28 26 25   GLUCOSE 184* 105* 117*  BUN 44* 37* 28*  CREATININE 1.96* 1.73* 1.65*  CALCIUM 7.3* 7.8* 7.4*   LFT  Recent Labs  07/04/14 0318  PROT 4.8*  ALBUMIN 1.6*  AST 62*  ALT 36  ALKPHOS 103  BILITOT 1.9*   PT/INR  Recent Labs  07/02/14 0010 07/03/14 0255  LABPROT 17.5* 17.4*  INR 1.43 1.42   Hepatitis Panel No results found for this basename: HEPBSAG, HCVAB, HEPAIGM, HEPBIGM,  in the last 72 hours C-Diff No results found for this basename: CDIFFTOX,  in the last 72 hours Fecal Lactopherrin No results found for this basename: FECLLACTOFRN,  in the last 72 hours  Studies/Results: No results found.  Medications:  Scheduled: . antiseptic oral rinse  15 mL Mouth Rinse q12n4p  . carvedilol  6.25 mg Oral BID WC  . chlorhexidine  15 mL Mouth Rinse BID  . feeding supplement (GLUCERNA SHAKE)  237 mL Oral BID BM   . folic acid  1 mg Oral Daily  . insulin aspart  0-9 Units Subcutaneous TID AC & HS  . lactulose  30 g Oral BID  . pantoprazole  40 mg Oral Q0600  . sodium chloride  3 mL Intravenous Q12H  . thiamine  100 mg Oral Daily   Continuous:   Assessment/Plan: 1) HCV/ETOH Cirrhosis. 2) S/p rectal variceal ligation. 3) Portal vein thrombosis.   No evidence of encephalopathy.  As for his portal vein thrombosis, in light of his recent severe GI bleed, I do not recommend any anticoagulation.    Plan: 1) Continue to follow HGB and transfuse if necessary. 2) HCV treatment in the near future. 3)  GI to assume care in AM.  LOS: 10 days   Anda Sobotta D 07/04/2014, 9:49 AM

## 2014-07-04 NOTE — Progress Notes (Signed)
FMTS Attending Daily Note:  Annabell Sabal MD  [phone] pager  Family Practice pager:  [phone] I have seen and examined this patient and have reviewed their chart. I have discussed this patient with the resident. I agree with the resident's findings, assessment and care plan.  Additionally:  - Hgb dropped o/n.  No complaints this AM.  WBC trending upwards, no signs or symptoms of infection.  Likely stress demargination from TIPS procedure - Repeat CBC later today - Plan is for DC to CIR, possibly tomorrow  Alveda Reasons, MD 07/04/2014 12:39 PM

## 2014-07-04 NOTE — Progress Notes (Signed)
Subjective: Pt awake and alert this am. Says he feels pretty good. Foley has been removed, voiding well Denies N/V, abd pain Denies further bleeding, had BM yesterday  Objective: Physical Exam: BP 124/64  Pulse 84  Temp(Src) 98.7 F (37.1 C) (Oral)  Resp 20  Ht 5\' 9"  (1.753 m)  Wt 212 lb 11.9 oz (96.5 kg)  BMI 31.40 kg/m2  SpO2 100% Awake, alert, NAD Neck: supple, (R)IJ site soft, no hematoma. Lugs: CTA Heart: Reg Abd: soft, NT, ND.   Labs: CBC  Recent Labs  07/03/14 0255 07/04/14 0318  WBC 14.3* 16.0*  HGB 8.6* 7.8*  HCT 26.0* 23.5*  PLT 112* 111*   BMET  Recent Labs  07/03/14 0255 07/04/14 0318  NA 144 143  K 3.9 3.5*  CL 105 105  CO2 26 25  GLUCOSE 105* 117*  BUN 37* 28*  CREATININE 1.73* 1.65*  CALCIUM 7.8* 7.4*   LFT  Recent Labs  07/04/14 0318  PROT 4.8*  ALBUMIN 1.6*  AST 62*  ALT 36  ALKPHOS 103  BILITOT 1.9*   PT/INR  Recent Labs  07/02/14 0010 07/03/14 0255  LABPROT 17.5* 17.4*  INR 1.43 1.42     Studies/Results: No results found.  Assessment/Plan: S/p TIPS, extended stenting of main portal vein, and coil embolization of mesenteric varices. Renal function remains stable Bili back down to 1.9 Looking good post procedure Seen by Dr. Annamaria Boots   LOS: 10 days    Ascencion Dike PA-C 07/04/2014 9:13 AM

## 2014-07-04 NOTE — Progress Notes (Signed)
Family Medicine Teaching Service Daily Progress Note Intern Pager: [phone]  Patient name: Edwin Love Medical record number: [id number] Date of birth: 1961-09-26 Age: 53 y.o. Gender: male  Primary Care Provider: No primary provider on file. Consultants: PCCM, GI, IR Code Status: DNR  Pt Overview and Major Events to Date:  7/16: Pt presented with acute GI bleed with a Hb of 6.2. 2u pRBCs. EtOH 131 on arrival. Abd CT showed cirrhosis, multiple new hepatic lesions, partial thrombosis of portal vein, portal HTN with multiple varices, periportal adenopathy cannot be excluded, umbilical hernia with herniation of small bowel, extensive small bowel wall edema.  Endoscopy performed: varices however negative for upper GI bleed. Bleeding scan with a likely source in the sigmoid colon. 2uFFP and vitamin K given. Continued bleeding. 2u pRBCs and L IJ cordis placement. Right arterial line placement.  7/17: Intubated secondary to shock, AMS, and respiratory insufficiency. Levophed started. IR visceral arteriography of IMA, SMA, and left gastric artery: no acute arterial bleeding source found . Colonoscopy resulting in  cardiac arrest with PEA/Asystole with CPR. Emergency OR: Oversew of bleeding rectal varices. Ceftriaxone. Levophed started.  7/18: Worsening metabolic acidosis. HCO3 infusion increased. 1u pRBCs, 1u platelets, 4u FFP. Levophed discontinued in PM 7/19: 1u pRBC, 4 u FFP. Acute overcompensated met alkalosis. Bicarb GTT discontinued and vent settings changed. 7/20: Improving  7/21: Pt self-extubated, dysphagia 3 diet, transferred out of ICU 7/24: TIPs; CT scan reviewed (portal thrombosis).  No anticoagulation per GI  Assessment and Plan: 53 y/o male with a PMH of alcohol abuse presenting with copious BRBPR found to have large rectal varices s/p oversewing of those varices (7/17).  TIPs procedure on 7/24.  Acute lower GI bleed: Found to be secondary to rectal varices (from  cirrhosis/portal HTN secondary to ETOH abuse and Hep C). POD # 8 s/p ligation of rectal varices (surgery now signed off).  S/P TIPs on 7/24. - GI following, appreciate recs.  Continue PO protonix. - No reported bleeding. Hb trending downward today 8.6 >> 7.8.  - Repeat CBC later today. Will continue to monitor closely.   Cirrhosis of the liver:  Secondary to EtOH abuse. Patient also noted to have a new diagnosis of HCV (+Ab and RNA). - AST/ALT continuing to trend downward. - Continue Thiamine/Folic acid in setting of known ETOH abuse. - Continue Coreg for varices.    Leukocytosis - WBC count increasing - 16.0. - No current signs of infection and patient afebrile. - Likely due to stress from recent procedure. - Will continue to monitor/trend  Hepatitis C: New diagnosis - ID Consult likely in outpatient setting regarding treatment options.  Encephalopathy - Currently resolved - Lactulose BID.  - Continue to monitor closely.  Acute kidney injury superimposed on CKD stage III: Baseline creatinine 0.9-1.4. On admission it was 1.6 but increased to max of 3.8 in setting of shock. - Continuing to improve. Down to 1.65 today. - Will continue to trend.  Hypokalemia - K+ 3.5 this am. Will replete.  Thrombocytopenia: Secondary to liver disease. - Stable @ 111. - Will continue to monitor.  New hepatic lesions noted on CT: Concerning for hepatocellular carcinoma as AFP was 2.8.  - Workup/follow up in outpatient setting.   Diastolic HF: Echocardiogram showed EF of 55-60% with grade 2 diastolic dysfunction - Currently euvolemic. Will continue to assess volume status daily.  Hypertension: - BP well controlled currently - 127/69. - Continuing coreg.  FEN/GI: 2 gram sodium diet PPx: SCD's; Holding anticoagulants in setting of  prior bleed per GI  Disposition: Planning CIR. Hopefully Monday.  Subjective:  Feeling well. No complaints.  Denies bleeding. Reports that he is ready to go to  CIR.  Objective: Temp:  [98 F (36.7 C)-99.7 F (37.6 C)] 99.7 F (37.6 C) (07/26 0421) Pulse Rate:  [81-88] 85 (07/26 0421) Resp:  [25-35] 25 (07/26 0421) BP: (112-122)/(57-67) 116/61 mmHg (07/26 0421) SpO2:  [97 %-100 %] 97 % (07/26 0421) Weight:  [212 lb 11.9 oz (96.5 kg)] 212 lb 11.9 oz (96.5 kg) (07/26 0421)  Physical Exam: General: resting in bed, NAD. Pleasant and cooperative. Cardiovascular: Regular rate and rhythm. 2/6 Systolic murmur noted. Respiratory: CTAB. No rales, rhonchi or wheezing noted. Abdomen: soft, nontender, nondistended.  Extremities: No LE edema.  Skin: No rashes noted  Neuro: AO x 3. No focal deficits.  Laboratory:  Recent Labs Lab 07/02/14 0331 07/03/14 0255 07/04/14 0318  WBC 7.7 14.3* 16.0*  HGB 8.8* 8.6* 7.8*  HCT 27.0* 26.0* 23.5*  PLT 74* 112* 111*    Recent Labs Lab 07/02/14 0010 07/03/14 0255 07/04/14 0318  NA 142 144 143  K 3.4* 3.9 3.5*  CL 102 105 105  CO2 _0 BUN 44* 37* 28*  CREATININE 1.96* 1.73* 1.65*  CALCIUM 7.3* 7.8* 7.4*  PROT 5.4* 5.0* 4.8*  BILITOT 1.1 4.2* 1.9*  ALKPHOS 147* 107 103  ALT 126* 68* 36  AST 110* 135* 62*  GLUCOSE 184* 105* 117*    RPR neg Heb B Ab neg HCV Ab Reactive, HCV RNA positive  HIV non-reactive   Imaging/Diagnostic Tests: 7/16 Abd FV:OHKGOVPCH, multiple new hepatic lesions are noted, partial thrombosis of portal vein, portal HTN with multiple varices, periportal adenopathy cannot be excluded, umbilical hernia with herniation of small bowel, extensive small bowel wall edema.   7/16 EGD: varices present; however, no acute bleeding.   7/16 Bleeding Scan:positive for acute lower GI bleed with source likely within the sigmoid colon.   7/17 Colonoscopy: Large varix with ulceration of the tip in the rectal vault. Clips placed and injection of ethanolamine, however study was stopped due to hypotension and arrhythmia.   7/20 CXR: Line and tube positions stable. Congestive heart  failure with pulmonary edema.  7/22 CXR: Interim removal of lines and tubes. Interim partial clearing of pulmonary edema.  [place] PGY-3 Contact: [place] Intern pager [phone], text pages welcome

## 2014-07-05 ENCOUNTER — Inpatient Hospital Stay (HOSPITAL_COMMUNITY)
Admission: RE | Admit: 2014-07-05 | Discharge: 2014-07-08 | DRG: 945 | Disposition: A | Discharge status: Home / Self Care | Payer: Medicaid Other | Source: Intra-hospital | Attending: Physical Medicine & Rehabilitation | Admitting: Physical Medicine & Rehabilitation

## 2014-07-05 ENCOUNTER — Encounter (HOSPITAL_COMMUNITY): Payer: Self-pay | Admitting: *Deleted

## 2014-07-05 ENCOUNTER — Other Ambulatory Visit: Payer: Self-pay | Admitting: Radiology

## 2014-07-05 DIAGNOSIS — F17200 Nicotine dependence, unspecified, uncomplicated: Secondary | ICD-10-CM | POA: Diagnosis not present

## 2014-07-05 DIAGNOSIS — E1149 Type 2 diabetes mellitus with other diabetic neurological complication: Secondary | ICD-10-CM | POA: Diagnosis not present

## 2014-07-05 DIAGNOSIS — F3289 Other specified depressive episodes: Secondary | ICD-10-CM

## 2014-07-05 DIAGNOSIS — F1410 Cocaine abuse, uncomplicated: Secondary | ICD-10-CM | POA: Diagnosis not present

## 2014-07-05 DIAGNOSIS — I129 Hypertensive chronic kidney disease with stage 1 through stage 4 chronic kidney disease, or unspecified chronic kidney disease: Secondary | ICD-10-CM | POA: Diagnosis not present

## 2014-07-05 DIAGNOSIS — K7031 Alcoholic cirrhosis of liver with ascites: Secondary | ICD-10-CM

## 2014-07-05 DIAGNOSIS — H5440 Blindness, one eye, unspecified eye: Secondary | ICD-10-CM | POA: Diagnosis not present

## 2014-07-05 DIAGNOSIS — R5381 Other malaise: Secondary | ICD-10-CM

## 2014-07-05 DIAGNOSIS — I868 Varicose veins of other specified sites: Secondary | ICD-10-CM | POA: Diagnosis not present

## 2014-07-05 DIAGNOSIS — K922 Gastrointestinal hemorrhage, unspecified: Secondary | ICD-10-CM

## 2014-07-05 DIAGNOSIS — N183 Chronic kidney disease, stage 3 unspecified: Secondary | ICD-10-CM

## 2014-07-05 DIAGNOSIS — Z5189 Encounter for other specified aftercare: Principal | ICD-10-CM

## 2014-07-05 DIAGNOSIS — Z79899 Other long term (current) drug therapy: Secondary | ICD-10-CM

## 2014-07-05 DIAGNOSIS — F1210 Cannabis abuse, uncomplicated: Secondary | ICD-10-CM | POA: Diagnosis not present

## 2014-07-05 DIAGNOSIS — R1310 Dysphagia, unspecified: Secondary | ICD-10-CM

## 2014-07-05 DIAGNOSIS — F329 Major depressive disorder, single episode, unspecified: Secondary | ICD-10-CM

## 2014-07-05 DIAGNOSIS — I5030 Unspecified diastolic (congestive) heart failure: Secondary | ICD-10-CM

## 2014-07-05 DIAGNOSIS — D500 Iron deficiency anemia secondary to blood loss (chronic): Secondary | ICD-10-CM

## 2014-07-05 DIAGNOSIS — J9600 Acute respiratory failure, unspecified whether with hypoxia or hypercapnia: Secondary | ICD-10-CM

## 2014-07-05 DIAGNOSIS — E1142 Type 2 diabetes mellitus with diabetic polyneuropathy: Secondary | ICD-10-CM

## 2014-07-05 DIAGNOSIS — Z9889 Other specified postprocedural states: Secondary | ICD-10-CM

## 2014-07-05 DIAGNOSIS — K649 Unspecified hemorrhoids: Secondary | ICD-10-CM

## 2014-07-05 DIAGNOSIS — I509 Heart failure, unspecified: Secondary | ICD-10-CM | POA: Diagnosis not present

## 2014-07-05 LAB — GLUCOSE, CAPILLARY
GLUCOSE-CAPILLARY: 106 mg/dL — AB (ref 70–99)
GLUCOSE-CAPILLARY: 163 mg/dL — AB (ref 70–99)
GLUCOSE-CAPILLARY: 106 mg/dL — AB (ref 70–99)
Glucose-Capillary: 89 mg/dL (ref 70–99)

## 2014-07-05 LAB — CBC
HCT: 24.5 % — ABNORMAL LOW (ref 39.0–52.0)
Hemoglobin: 8.1 g/dL — ABNORMAL LOW (ref 13.0–17.0)
MCH: 29.8 pg (ref 26.0–34.0)
MCHC: 33.1 g/dL (ref 30.0–36.0)
MCV: 90.1 fL (ref 78.0–100.0)
PLATELETS: 119 10*3/uL — AB (ref 150–400)
RBC: 2.72 MIL/uL — AB (ref 4.22–5.81)
RDW: 15.4 % (ref 11.5–15.5)
WBC: 15.4 10*3/uL — AB (ref 4.0–10.5)

## 2014-07-05 LAB — HCV RNA QUANT RFLX ULTRA OR GENOTYP
HCV Quantitative Log: 2.47 {Log} — ABNORMAL HIGH (ref <1.18)
HCV Quantitative: 298 IU/mL — ABNORMAL HIGH (ref <15)

## 2014-07-05 MED ORDER — CHLORHEXIDINE GLUCONATE 0.12 % MT SOLN
15.0000 mL | Freq: Two times a day (BID) | OROMUCOSAL | Status: DC
  Administered 2014-07-05 – 2014-07-08 (×5): 15 mL via OROMUCOSAL
  Filled 2014-07-05 (×8): qty 15

## 2014-07-05 MED ORDER — GLUCERNA SHAKE PO LIQD
237.0000 mL | Freq: Two times a day (BID) | ORAL | Status: DC
  Administered 2014-07-06 – 2014-07-07 (×3): 237 mL via ORAL

## 2014-07-05 MED ORDER — ONDANSETRON HCL 4 MG PO TABS: 4.0000 mg | ORAL_TABLET | Freq: Four times a day (QID) | ORAL | Status: DC | PRN

## 2014-07-05 MED ORDER — PANTOPRAZOLE SODIUM 40 MG PO TBEC
40.0000 mg | DELAYED_RELEASE_TABLET | Freq: Every day | ORAL | Status: DC
  Administered 2014-07-06 – 2014-07-08 (×3): 40 mg via ORAL
  Filled 2014-07-05 (×3): qty 1

## 2014-07-05 MED ORDER — CARVEDILOL 6.25 MG PO TABS: 6.2500 mg | ORAL_TABLET | Freq: Two times a day (BID) | ORAL | Status: DC

## 2014-07-05 MED ORDER — PANTOPRAZOLE SODIUM 40 MG PO TBEC: 40.0000 mg | DELAYED_RELEASE_TABLET | Freq: Every day | ORAL | Status: DC

## 2014-07-05 MED ORDER — CITALOPRAM HYDROBROMIDE 20 MG PO TABS
20.0000 mg | ORAL_TABLET | Freq: Every day | ORAL | Status: DC
  Administered 2014-07-05 – 2014-07-08 (×4): 20 mg via ORAL
  Filled 2014-07-05 (×5): qty 1

## 2014-07-05 MED ORDER — BIOTENE DRY MOUTH MT LIQD
15.0000 mL | Freq: Two times a day (BID) | OROMUCOSAL | Status: DC
  Administered 2014-07-05 – 2014-07-07 (×4): 15 mL via OROMUCOSAL

## 2014-07-05 MED ORDER — VITAMIN B-1 100 MG PO TABS
100.0000 mg | ORAL_TABLET | Freq: Every day | ORAL | Status: DC
  Administered 2014-07-06 – 2014-07-08 (×3): 100 mg via ORAL
  Filled 2014-07-05 (×4): qty 1

## 2014-07-05 MED ORDER — FOLIC ACID 1 MG PO TABS
1.0000 mg | ORAL_TABLET | Freq: Every day | ORAL | Status: DC
  Administered 2014-07-06 – 2014-07-08 (×3): 1 mg via ORAL
  Filled 2014-07-05 (×4): qty 1

## 2014-07-05 MED ORDER — ACETAMINOPHEN 325 MG PO TABS: 325.0000 mg | ORAL_TABLET | ORAL | Status: DC | PRN

## 2014-07-05 MED ORDER — ALBUTEROL SULFATE (2.5 MG/3ML) 0.083% IN NEBU: 2.5000 mg | INHALATION_SOLUTION | RESPIRATORY_TRACT | Status: DC | PRN

## 2014-07-05 MED ORDER — INSULIN ASPART 100 UNIT/ML ~~LOC~~ SOLN
0.0000 [IU] | Freq: Three times a day (TID) | SUBCUTANEOUS | Status: DC
  Administered 2014-07-06 – 2014-07-07 (×2): 1 [IU] via SUBCUTANEOUS

## 2014-07-05 MED ORDER — SORBITOL 70 % SOLN: 30.0000 mL | Freq: Every day | Status: DC | PRN

## 2014-07-05 MED ORDER — ONDANSETRON HCL 4 MG/2ML IJ SOLN: 4.0000 mg | Freq: Four times a day (QID) | INTRAMUSCULAR | Status: DC | PRN

## 2014-07-05 MED ORDER — FOLIC ACID 1 MG PO TABS: 1.0000 mg | ORAL_TABLET | Freq: Every day | ORAL | Status: DC

## 2014-07-05 MED ORDER — CARVEDILOL 6.25 MG PO TABS
6.2500 mg | ORAL_TABLET | Freq: Two times a day (BID) | ORAL | Status: DC
  Administered 2014-07-05 – 2014-07-08 (×6): 6.25 mg via ORAL
  Filled 2014-07-05 (×8): qty 1

## 2014-07-05 MED ORDER — THIAMINE HCL 100 MG PO TABS: 100.0000 mg | ORAL_TABLET | Freq: Every day | ORAL | Status: DC

## 2014-07-05 NOTE — Progress Notes (Signed)
          Daily Rounding Note  07/05/2014, 8:07 AM  LOS: 11 days   SUBJECTIVE:       Brown BMs.  Feeling well.  Ambulating without issue.  On solids  OBJECTIVE:         Vital signs in last 24 hours:    Temp:  [97.9 F (36.6 C)-99.4 F (37.4 C)] 98.9 F (37.2 C) (07/27 0713) Pulse Rate:  [79-85] 79 (07/27 0713) Resp:  [18-25] 22 (07/27 0713) BP: (130-142)/(68-83) 132/68 mmHg (07/27 0713) SpO2:  [98 %-100 %] 98 % (07/27 0713) Last BM Date: 07/03/14 General: looks well   Heart: RRR Chest: clear bil.  No dyspnea Abdomen: soft, NT, ND.  No mass or HSM  Extremities: no CCE Neuro/Psych:  No tremor, no asterixis.   Intake/Output from previous day: 07/26 0701 - 07/27 0700 In: 303 [P.O.:300; I.V.:3] Out: 1625 [Urine:1625]  Intake/Output this shift: Total I/O In: -  Out: 300 [Urine:300]  Lab Results:  Recent Labs  07/04/14 0318 07/04/14 1250 07/05/14 0459  WBC 16.0* 16.1* 15.4*  HGB 7.8* 8.4* 8.1*  HCT 23.5* 25.4* 24.5*  PLT 111* 114* 119*   BMET  Recent Labs  07/03/14 0255 07/04/14 0318  NA 144 143  K 3.9 3.5*  CL 105 105  CO2 26 25  GLUCOSE 105* 117*  BUN 37* 28*  CREATININE 1.73* 1.65*  CALCIUM 7.8* 7.4*   LFT  Recent Labs  07/03/14 0255 07/04/14 0318  PROT 5.0* 4.8*  ALBUMIN 1.8* 1.6*  AST 135* 62*  ALT 68* 36  ALKPHOS 107 103  BILITOT 4.2* 1.9*   PT/INR  Recent Labs  07/03/14 0255  LABPROT 17.4*  INR 1.42     ASSESMENT:   *  S/p surgical clipping of bleeding rectal varices.  *  S/p TIPS.  *  alcoholic hepatitis. Labs improved.  *  ABL anemia, stable.  *  ARF.   *  Thrombocytopenia. Coagulopathy.    PLAN   *  IR will periodically  be reassessing tips for patency. *  Reminded pt of need for total ETOH abstinence.  *  GI will sign off.  Available PRN.  *  D/c's lactulose.  Never had elevated ammonia.  Can restart if pt appears confused.     Azucena Freed  07/05/2014, 8:07  AM Pager: [phone]     Attending physician's note   I have taken an interval history, reviewed the chart and examined the patient. I agree with the Advanced Practitioner's note, impression and recommendations. Stable post TIPS, extended stenting of main portal vein and coil embolization of mesenteric varices on 7/24 by IR and post surgical oversew of rectal varices on 7/17. Follow recommendations by IR. OP GI follow up with Dr. [name] as needed. GI signing off.   Pricilla Riffle. Fuller Plan, MD [facility]

## 2014-07-05 NOTE — Progress Notes (Signed)
Per Ivin Booty, [place], patient was found ambulating in his room on his own.  NT reminded patient of fall safety precautions.  Patient also reminded to call for assistance when getting up by RN.  Will continue to monitor.

## 2014-07-05 NOTE — Discharge Instructions (Signed)
You came in with a large lower gastrointestinal bleed. This was most likely due to liver injury from drinking so much alcohol  Please avoid drinking alcohol from now on. If you have blood in your stools or in your vomit, please seek medical attention immediately.   Seek medication attention if you Alcoholic Liver Disease Alcoholic liver disease means you have a damaged liver that does not work properly. This disease is caused by drinking too much alcohol. Some people do not have any problems until the disease has become very bad. Problems can be worse after a period of heavy drinking. HOME CARE  Stop drinking alcohol.  Get expert advice or help (counseling) to quit drinking. Join an alchohol support group.  You may need to eat foods that give you energy (carbohydrates), such as:  Milk and yogurt.  Navy, pinto, and white beans.  Applesauce, grapes, dried dates, prunes, and raisins.  Potatoes and rice.  Eat foods that are high in vitamins, especially thiamine and folic acid. These foods include:  Whole-wheat or whole-grain breads and cereals. Look on the package for "added thiamine" or "added folic acid."  Meat, especially pork.  Fresh, raw vegetables.  Fresh fruits and vegetables, such as oranges, orange juice, avocados, beets, and cantaloupe.  Dark green, leafy vegetables, such as romaine lettuce, spinach, and broccoli.  Beans and nuts.  Dairy foods, such as milk, butter, yogurt, cheese, and ice cream. GET HELP RIGHT AWAY IF:   You have bright red blood in your poop (stool).  You cough or throw up (vomit) blood.  Your skin and eyes turn more yellow.  You have a bad headache or problems thinking.  You have trouble walking. MAKE SURE YOU:   Understand these instructions.  Will watch your condition.  Will get help right away if you are not doing well or get worse. Document Released: 09/23/2009 Document Revised: 02/18/2012 Document Reviewed: 09/23/2009 [facility]  Patient Information 2015 [place], [place]. This information is not intended to replace advice given to you by your health care provider. Make sure you discuss any questions you have with your health care provider.

## 2014-07-05 NOTE — Progress Notes (Signed)
Meredith Staggers, MD Physician Signed Physical Medicine and Rehabilitation Consult Note Service date: 06/30/2014 2:41 PM  Related encounter: Admission (Discharged) from 06/24/2014 in [facility]           Physical Medicine and Rehabilitation Consult Reason for Consult: Deconditioning/multi-medical Referring Physician: Family medicine     HPI: Edwin Love is a 53 y.o. right-handed male history of hypertension, grade 2 diastolic dysfunction, chronic renal insufficiency with baseline creatinine 3.43, blindness left eye, cirrhosis and esophageal varices. Patient independent with a cane living with his parents prior to admission. Admitted 06/24/2014 after being found lying in a pool of blood by his family. CT abdomen and pelvis showed partial thrombosis of the portal vein, multiple new hepatic lesions noted as well as extensive small bowel wall edema. Hemoglobin upon admission 6.2 and was transfused. Underwent EGD 06/24/2014 negative for UGIB. Bleeding scan/colonoscopy positive for LGIB in the sigmoid colon per workup of gastroenterology Dr. [name].. Patient with persistent rectal bleeding underwent oversew of rectal varices per Dr. Coralie Keens 06/25/2014. Hospital course dysphagia currently on mechanical soft nectar thick liquids. Physical therapy evaluation completed 06/30/2014 with recommendations for physical medicine rehabilitation consult.   Review of Systems  Eyes: Positive for pain.        Left eye blindness  Gastrointestinal: Positive for nausea, blood in stool and melena.  Musculoskeletal: Positive for back pain.  Psychiatric/Behavioral: Positive for depression.  All other systems reviewed and are negative. Past Medical History   Diagnosis  Date   .  Hypertension     .  Back pain     .  Renal disorder         CKD stage 3 noted in 02/2014 [facility]   .  Anemia  01/2014   .  LVH (left ventricular hypertrophy)  02/2014       grade 2  diastolic dysfunction.    .  Ankle fracture, left  10/2008       non surgical mgt.    .  Thrombocytopenia  06/2014   .  Coagulopathy  06/2014    Past Surgical History   Procedure  Laterality  Date   .  Eye surgery  Left  1990's       Foreign body   .  Inguinal hernia repair  Right  02/08/2014       Procedure: HERNIA REPAIR INGUINAL INCARCERATED;  Surgeon: Edward Jolly, MD;  Location: [place];  Service: General;  Laterality: Right;   .  Insertion of mesh  Right  02/08/2014       Procedure: INSERTION OF MESH;  Surgeon: Edward Jolly, MD;  Location: [place];  Service: General;  Laterality: Right;   .  Hernia repair       .  Esophagogastroduodenoscopy  N/A  06/24/2014       Procedure: ESOPHAGOGASTRODUODENOSCOPY (EGD);  Surgeon: Inda Castle, MD;  Location: [facility];  Service: Endoscopy;  Laterality: N/A;   .  Colonoscopy  N/A  06/25/2014       Procedure: COLONOSCOPY;  Surgeon: Inda Castle, MD;  Location: [place];  Service: Endoscopy;  Laterality: N/A;   .  Hemorrhoid surgery  N/A  06/25/2014       Procedure: oversew of rectal varices;  Surgeon: Harl Bowie, MD;  Location: [place];  Service: General;  Laterality: N/A;    Family History   Problem  Relation  Age of Onset   .  Diabetes  Brother         Mother too   .  Hypertension           family   .  Sickle cell anemia  Sister      Social History: reports that he has been smoking Cigarettes.  He has a 17 pack-year smoking history. He does not have any smokeless tobacco history on file. He reports that he drinks alcohol. He reports that he uses illicit drugs (Marijuana and Cocaine). Allergies: No Known Allergies Medications Prior to Admission   Medication  Sig  Dispense  Refill   .  amLODipine (NORVASC) 5 MG tablet  Take 1 tablet (5 mg total) by mouth daily.   30 tablet   3   .  carvedilol (COREG) 12.5 MG tablet  Take 12.5 mg by mouth 2 (two) times daily with a meal.         .  citalopram (CELEXA) 20 MG tablet  Take  20 mg by mouth daily.         .  ferrous sulfate 325 (65 FE) MG tablet  Take 325 mg by mouth 3 (three) times daily.         .  hydrOXYzine (ATARAX/VISTARIL) 50 MG tablet  Take 50 mg by mouth daily at 10 pm.         .  ibuprofen (ADVIL,MOTRIN) 200 MG tablet  Take 400 mg by mouth every 6 (six) hours as needed for moderate pain.         Marland Kitchen  lisinopril (PRINIVIL,ZESTRIL) 10 MG tablet  Take 2 tablets (20 mg total) by mouth daily.   60 tablet   3   .  paliperidone (INVEGA) 6 MG 24 hr tablet  Take 6 mg by mouth daily.            Home: Home Living Family/patient expects to be discharged to:: Private residence Living Arrangements: Parent Available Help at Discharge: Family Type of Home: House Home Access: Stairs to enter Technical brewer of Steps: 3 Entrance Stairs-Rails: Can reach both [place]: Two level;Able to live on main level with bedroom/bathroom Alternate Level Stairs-Number of Steps: 2 (to get to kitchen) Alternate Level Stairs-Rails: None Home Equipment: Cane - single point Additional Comments: walk in shower   Functional History: Prior Function Level of Independence: Independent Comments: occasional use of cane Functional Status:   Mobility: Bed Mobility Overal bed mobility: Needs Assistance Bed Mobility: Rolling;Sidelying to Sit Rolling: Supervision Sidelying to sit: Min assist General bed mobility comments: HOB 0 with rail; vc for sequencing and initiation; assist to raise torso due to slow movement Transfers Overall transfer level: Needs assistance Equipment used: Rolling walker (2 wheeled);None Transfers: Sit to/from Stand Sit to Stand: Mod assist General transfer comment: x3 with and without RW; requires assist to advance forward over BOS Ambulation/Gait Ambulation/Gait assistance: Min assist Ambulation Distance (Feet): 12 Feet Assistive device: Rolling walker (2 wheeled) Gait Pattern/deviations: Step-through pattern;Decreased stride length;Trunk  flexed;Wide base of support Gait velocity: very slow Gait velocity interpretation: <1.8 ft/sec, indicative of risk for recurrent falls General Gait Details: pt unfamiliar with use of RW with vc and assist to maneuver and use safely; steady assist   ADL:   Cognition: Cognition Overall Cognitive Status: Impaired/Different from baseline (I'm still fuzzy and not fully myself) Orientation Level: Oriented to person;Oriented to place;Disoriented to situation Cognition Arousal/Alertness: Awake/alert Behavior During Therapy: WFL for tasks assessed/performed Overall Cognitive Status: Impaired/Different from baseline (I'm still fuzzy and not fully  myself) Area of Impairment: Problem solving Problem Solving: Slow processing;Decreased initiation   Blood pressure 105/74, pulse 79, temperature 98.1 F (36.7 C), temperature source Oral, resp. rate 24, height 5\' 9"  (1.753 m), weight 94 kg (207 lb 3.7 oz), SpO2 100.00%. Physical Exam  Vitals reviewed. Constitutional:  53 year old African American male appearing older than stated age  HENT:  Poor dentition  Eyes: Conjunctivae and EOM are normal. Pupils are equal, round, and reactive to light.  Neck: Normal range of motion. Neck supple. No tracheal deviation present. No thyromegaly present.  Cardiovascular: Normal rate and regular rhythm.   Respiratory: Effort normal and breath sounds normal. No respiratory distress.  GI: Bowel sounds are normal. He exhibits distension.  Mild abdominal distention and nontender  Neurological:  Patient is alert with flat affect. He was appropriate for name, age date of birth. He did follow simple commands. A bit distractible, impulsive. Decreased safety awareness. Moved all 4 limbs. Grossly 4- prox to 4/5 distally in UE's and 3/5 prox to 4/5 distallly in LE's. Sensed pain in all 4's.   Skin: Skin is warm and dry.  Psychiatric:  Restless, generally cooperative     Results for orders placed during the hospital  encounter of 06/24/14 (from the past 24 hour(s))   GLUCOSE, CAPILLARY     Status: Abnormal     Collection Time      06/29/14  4:12 PM       Result  Value  Ref Range     Glucose-Capillary  150 (*)  70 - 99 mg/dL   BASIC METABOLIC PANEL     Status: Abnormal     Collection Time      06/29/14  4:15 PM       Result  Value  Ref Range     Sodium  147   137 - 147 mEq/L     Potassium  3.4 (*)  3.7 - 5.3 mEq/L     Chloride  103   96 - 112 mEq/L     CO2  27   19 - 32 mEq/L     Glucose, Bld  138 (*)  70 - 99 mg/dL     BUN  53 (*)  6 - 23 mg/dL     Creatinine, Ser  2.25 (*)  0.50 - 1.35 mg/dL     Calcium  6.8 (*)  8.4 - 10.5 mg/dL     GFR calc non Af Amer  32 (*)  >90 mL/min     GFR calc Af Amer  37 (*)  >90 mL/min     Anion gap  17 (*)  5 - 15   MAGNESIUM     Status: Abnormal     Collection Time      06/29/14  4:15 PM       Result  Value  Ref Range     Magnesium  1.3 (*)  1.5 - 2.5 mg/dL   PHOSPHORUS     Status: None     Collection Time      06/29/14  4:15 PM       Result  Value  Ref Range     Phosphorus  3.2   2.3 - 4.6 mg/dL   GLUCOSE, CAPILLARY     Status: Abnormal     Collection Time      06/29/14  9:03 PM       Result  Value  Ref Range     Glucose-Capillary  150 (*)  70 - 99  mg/dL     Comment 1  Notify RN      CBC     Status: Abnormal     Collection Time      06/30/14  3:29 AM       Result  Value  Ref Range     WBC  8.9   4.0 - 10.5 K/uL     RBC  3.26 (*)  4.22 - 5.81 MIL/uL     Hemoglobin  9.7 (*)  13.0 - 17.0 g/dL     HCT  29.4 (*)  39.0 - 52.0 %     MCV  90.2   78.0 - 100.0 fL     MCH  29.8   26.0 - 34.0 pg     MCHC  33.0   30.0 - 36.0 g/dL     RDW  16.0 (*)  11.5 - 15.5 %     Platelets  35 (*)  150 - 400 K/uL   COMPREHENSIVE METABOLIC PANEL     Status: Abnormal     Collection Time      06/30/14  3:29 AM       Result  Value  Ref Range     Sodium  146   137 - 147 mEq/L     Potassium  2.7 (*)  3.7 - 5.3 mEq/L     Chloride  102   96 - 112 mEq/L     CO2  32   19 - 32  mEq/L     Glucose, Bld  91   70 - 99 mg/dL     BUN  50 (*)  6 - 23 mg/dL     Creatinine, Ser  2.15 (*)  0.50 - 1.35 mg/dL     Calcium  7.0 (*)  8.4 - 10.5 mg/dL     Total Protein  5.5 (*)  6.0 - 8.3 g/dL     Albumin  2.1 (*)  3.5 - 5.2 g/dL     AST  365 (*)  0 - 37 U/L     ALT  250 (*)  0 - 53 U/L     Alkaline Phosphatase  131 (*)  39 - 117 U/L     Total Bilirubin  1.6 (*)  0.3 - 1.2 mg/dL     GFR calc non Af Amer  33 (*)  >90 mL/min     GFR calc Af Amer  39 (*)  >90 mL/min     Anion gap  12   5 - 15   GLUCOSE, CAPILLARY     Status: Abnormal     Collection Time      06/30/14  7:45 AM       Result  Value  Ref Range     Glucose-Capillary  110 (*)  70 - 99 mg/dL   GLUCOSE, CAPILLARY     Status: Abnormal     Collection Time      06/30/14 11:47 AM       Result  Value  Ref Range     Glucose-Capillary  117 (*)  70 - 99 mg/dL    Dg Chest Port 1 View   06/30/2014   CLINICAL DATA:  Shortness of breath.  EXAM: PORTABLE CHEST - 1 VIEW  COMPARISON:  06/28/2014.  01/21/2014.  FINDINGS: Interim removal of endotracheal tube and NG tube. Cardiomegaly. Pulmonary vascularity is slightly prominent. Previously identified pulmonary edema has partially cleared. No pleural effusion or pneumothorax. No acute osseus abnormality.  IMPRESSION: 1.  Interim removal of lines and tubes. 2. Interim partial clearing of pulmonary edema.   Electronically Signed   By: Marcello Moores  Register   On: 06/30/2014 07:34     Assessment/Plan: Diagnosis: deconditioning due to GIB, multiple medical   Does the need for close, 24 hr/day medical supervision in concert with the patient's rehab needs make it unreasonable for this patient to be served in a less intensive setting? Yes Co-Morbidities requiring supervision/potential complications: ETOH abuse, ckd, cirrhosis Due to bladder management, bowel management, safety, skin/wound care, disease management, medication administration, pain management and patient education, does the patient  require 24 hr/day rehab nursing? Yes Does the patient require coordinated care of a physician, rehab nurse, PT (1-2 hrs/day, 5 days/week), OT (1-2 hrs/day, 5 days/week) and SLP (1-2 hrs/day, 5 days/week) to address physical and functional deficits in the context of the above medical diagnosis(es)? Yes Addressing deficits in the following areas: balance, endurance, locomotion, strength, transferring, bowel/bladder control, bathing, dressing, feeding, grooming, toileting, cognition, swallowing and psychosocial support Can the patient actively participate in an intensive therapy program of at least 3 hrs of therapy per day at least 5 days per week? Yes The potential for patient to make measurable gains while on inpatient rehab is excellent Anticipated functional outcomes upon discharge from inpatient rehab are modified independent and supervision  with PT, modified independent and supervision with OT, modified independent and supervision with SLP. Estimated rehab length of stay to reach the above functional goals is: 10-14 days Does the patient have adequate social supports to accommodate these discharge functional goals? Yes and Potentially Anticipated D/C setting: Home Anticipated post D/C treatments: HH therapy and Outpatient therapy Overall Rehab/Functional Prognosis: good   RECOMMENDATIONS: This patient's condition is appropriate for continued rehabilitative care in the following setting: CIR Patient has agreed to participate in recommended program. Potentially Note that insurance prior authorization may be required for reimbursement for recommended care.   Comment: Will need supervision at home. Rehab admissions coordinator will follow up.    Meredith Staggers, MD, [place] [facility]         06/30/2014    Revision History...     Date/Time User Action   07/01/2014 10:55 AM Meredith Staggers, MD Sign   06/30/2014 3:02 PM Cathlyn Parsons, PA-C  [facility]  View Details Report   Routing History...     Date/Time From To Method   07/01/2014 10:55 AM Meredith Staggers, MD Meredith Staggers, MD In Basket

## 2014-07-05 NOTE — Progress Notes (Addendum)
Rehab admissions - I have been following pt's case and spoke with Dr. Lorenso Courier, family medicine. Dr. Lorenso Courier stated that pt is medically stable for inpatient rehab and that TIPS procedure was completed successfully on Friday. Bed is available and will admit to inpatient rehab later today.  I updated pt who was pleased, but anxious to go home. "How long will I have to stay?" Pt was encouraged to work with team goals in order to go home at the safest point. I called and left voicemail with pt's dtr Arbie Cookey. I then heard back from [place] and told her the plan to admit today. Dtr was pleased with the plan for rehab.  I tried to reach pt's mother but phone did not pick up on a voicemail to leave a message.   I updated pt's RN and Blain Pais, case Freight forwarder and Lorriane Shire with social work as well about the plan to admit to inpatient rehab today.   Please call me with any questions. Thanks.  Nanetta Batty, PT Rehabilitation Admissions Coordinator [phone]

## 2014-07-05 NOTE — Progress Notes (Signed)
Patient admitted from [facility].  Patient alert and oriented x4; oriented to room and unit.  Vitals obtained; denies pain.  Explained Fall Prevention Safety Plan and patient signed.  Will continue to monitor.

## 2014-07-05 NOTE — PMR Pre-admission (Signed)
PMR Admission Coordinator Pre-Admission Assessment  Patient: Edwin Love is an 53 y.o., male MRN: [id number] DOB: 03-30-1961 Height: 5\' 9"  (175.3 cm) Weight: 96.5 kg (212 lb 11.9 oz)              Insurance Information HMO:     PPO:      PCP:      IPA:      80/20:      OTHER:  PRIMARY: Medicaid [place] Access      Policy#: [id number]      Subscriber: self CM Name:       Phone#:      Fax#:  Pre-Cert#:       Employer: not employed Benefits:  Phone #: [phone]     Name:  Eff. Date: verified eligibility on 07-05-14     Deduct:       Out of Pocket Max:       Life Max:  CIR: covered      SNF: covered Outpatient: covered     Co-Pay:  Home Health: covered      Co-Pay:  DME: covered     Co-Pay:   Providers: in network  Emergency Contact Information Contact Information   Name Relation Home Work Mobile   Claude Mother [phone]     Hall,Carol Daughter   [phone]     Current Medical History  Patient Admitting Diagnosis: deconditioning due to GIB, multiple medical    History of Present Illness: Edwin Love is a 53 y.o. right-handed male history of hypertension, grade 2 diastolic dysfunction, chronic renal insufficiency with baseline creatinine 3.43, blindness left eye, cirrhosis and esophageal varices. Patient independent with a cane living with his parents prior to admission. Admitted 06/24/2014 after being found lying in a pool of blood by his family. CT abdomen and pelvis showed partial thrombosis of the portal vein, multiple new hepatic lesions noted as well as extensive small bowel wall edema. Hemoglobin upon admission 6.2 and was transfused. Underwent EGD 06/24/2014 negative for UGIB. Bleeding scan/colonoscopy positive for LGIB in the sigmoid colon per workup of gastroenterology Dr. [name].. Patient with persistent rectal bleeding underwent oversew of rectal varices per Dr. Coralie Keens 06/25/2014. Hospital course dysphagia currently on mechanical  soft nectar thick liquids. Physical therapy evaluation completed 06/30/2014 with recommendations for physical medicine rehabilitation consult. TIPS completed on 07-02-14 and received medical clearance from family medicine service for inpatient rehab on 07-05-14.   Past Medical History  Past Medical History  Diagnosis Date  . Hypertension   . Back pain   . Renal disorder     CKD stage 3 noted in 02/2014 [facility]  . Anemia 01/2014  . LVH (left ventricular hypertrophy) 02/2014    grade 2 diastolic dysfunction.   . Ankle fracture, left 10/2008    non surgical mgt.   . Thrombocytopenia 06/2014  . Coagulopathy 06/2014    Family History  family history includes Diabetes in his brother; Hypertension in an other family member; Sickle cell anemia in his sister.  Prior Rehab/Hospitalizations: none   Current Medications  Current facility-administered medications:0.9 %  sodium chloride infusion, 250 mL, Intravenous, PRN, Rahul P Desai, PA-C;  albuterol (PROVENTIL) (2.5 MG/3ML) 0.083% nebulizer solution 2.5 mg, 2.5 mg, Nebulization, Q4H PRN, Tammy S Parrett, NP;  antiseptic oral rinse (BIOTENE) solution 15 mL, 15 mL, Mouth Rinse, q12n4p, Kara Mead V, MD, 15 mL at 07/05/14 1200 carvedilol (COREG) tablet 6.25 mg, 6.25 mg, Oral, BID WC, Archie Patten, MD, 6.25  mg at 07/05/14 0903;  chlorhexidine (PERIDEX) 0.12 % solution 15 mL, 15 mL, Mouth Rinse, BID, Kara Mead V, MD, 15 mL at 07/05/14 0902;  feeding supplement (GLUCERNA SHAKE) (GLUCERNA SHAKE) liquid 237 mL, 237 mL, Oral, BID BM, Rogue Bussing, RD, 237 mL at 31/51/76 1607 folic acid (FOLVITE) tablet 1 mg, 1 mg, Oral, Daily, Collene Gobble, MD, 1 mg at 07/05/14 0902;  hydrALAZINE (APRESOLINE) injection 10-40 mg, 10-40 mg, Intravenous, Q4H PRN, Elsie Stain, MD, 10 mg at 07/02/14 2127;  insulin aspart (novoLOG) injection 0-9 Units, 0-9 Units, Subcutaneous, TID AC & HS, Frazier Richards, MD, 2 Units at 07/05/14 1250 pantoprazole (PROTONIX) EC tablet  40 mg, 40 mg, Oral, Q0600, Alveda Reasons, MD, 40 mg at 07/05/14 3710;  RESOURCE THICKENUP CLEAR, , Oral, PRN, Raylene Miyamoto, MD;  sodium chloride 0.9 % injection 3 mL, 3 mL, Intravenous, Q12H, Vance Gather, MD, 3 mL at 07/05/14 6269;  thiamine (VITAMIN B-1) tablet 100 mg, 100 mg, Oral, Daily, Collene Gobble, MD, 100 mg at 07/05/14 4854  Patients Current Diet: Sodium Restricted  Precautions / Restrictions Precautions Precautions: Fall Restrictions Weight Bearing Restrictions: No   Prior Activity Level Community (5-7x/wk): pt got out everyday and walked every where to nearby stores. Pt cooks and cleans his basement living area.   Home Assistive Devices / Equipment Home Assistive Devices/Equipment: Gilford Rile (specify type) Home Equipment: Cane - single point  Prior Functional Level Prior Function Level of Independence: Independent Comments: occasional use of cane;  Pt reports he does not drive, and does not work   Current Teacher, music  Overall Cognitive Status: No family/caregiver present to determine baseline cognitive functioning Current Attention Level: Selective Orientation Level: Oriented to person;Oriented to place;Oriented to time Safety/Judgement: Decreased awareness of safety;Decreased awareness of deficits General Comments: remains impulsive with poor safety awareness (especially of multiple lines/tubes)    Extremity Assessment (includes Sensation/Coordination)          ADLs  Overall ADL's : Needs assistance/impaired Eating/Feeding: Supervision/ safety;Sitting Grooming: Wash/dry hands;Wash/dry face;Oral care;Minimal assistance;Standing Upper Body Bathing: Minimal assitance;Sitting Lower Body Bathing: Moderate assistance;Sit to/from stand Upper Body Dressing : Moderate assistance;Sitting Lower Body Dressing: Maximal assistance;Sit to/from stand Toilet Transfer: Minimal assistance;+2 for physical assistance;Ambulation;Comfort height toilet Toilet  Transfer Details (indicate cue type and reason): Requires assist to line up correctly with toilet and assist to control descent Toileting- Clothing Manipulation and Hygiene: Moderate assistance;Sit to/from stand Functional mobility during ADLs: Minimal assistance;+2 for physical assistance General ADL Comments: Pt self distracts easily.  Began to doff socks when prompted, but lost focus and forgot to complete task    Mobility  Overal bed mobility: Needs Assistance Bed Mobility: Rolling;Sidelying to Sit;Sit to Supine Rolling: Supervision Sidelying to sit: Min assist Supine to sit: Min assist Sit to supine: Min assist General bed mobility comments: pt suddenly needed to go to [facility] and began coming to EOB with no regard for his lines/tubes; HOB 20 with rail    Transfers  Overall transfer level: Needs assistance Equipment used: None;Rolling walker (2 wheeled) Transfers: Sit to/from Stand Sit to Stand: Min guard Stand pivot transfers: Mod assist;Min assist General transfer comment: remains impulsive, however slightly less; vc for proper sequencing with RW    Ambulation / Gait / Stairs / Wheelchair Mobility  Ambulation/Gait Ambulation/Gait assistance: Min assist Ambulation Distance (Feet): 200 Feet Assistive device: Rolling walker (2 wheeled);None Gait Pattern/deviations: Step-through pattern;Decreased stride length;Staggering right;Drifts right/left Gait velocity: very slow Gait velocity interpretation: <1.8  ft/sec, indicative of risk for recurrent falls General Gait Details: pt walking much better; attempted gait without RW with pt losing his balance with head turns (only turning 30 degrees); stagger to his Rt with min assist to recover; with RW requires vc for safe, proper use    Posture / Balance Static Standing Balance Rhomberg - Eyes Opened:  (minguard with anterior-posterior sway) Rhomberg - Eyes Closed:  (min A with incr sway; 20 seconds)    Special needs/care consideration  BiPAP/CPAP no  CPM no  Continuous Drip IV no  Dialysis no         Life Vest no  Oxygen no  Special Bed no  Trach Size no  Wound Vac (area) no       Skin - no issues                               Bowel mgmt: last BM on 07-05-14 Bladder mgmt: currently using urinal Diabetic mgmt - no formal DM diagnosis prior to admit but being managed with insulin while hospitalized   Previous Home Environment Living Arrangements: Parent (mother) Available Help at Discharge: Family Type of Home: House Home Layout: Two level;Able to live on main level with bedroom/bathroom Alternate Level Stairs-Rails: None Alternate Level Stairs-Number of Steps: 2 (to get to kitchen) Home Access: Stairs to enter Entrance Stairs-Rails: Can reach both Entrance Stairs-Number of Steps: 3 Bathroom Shower/Tub: Chiropodist: Standard Home Care Services: No Additional Comments: walk in shower  Discharge Living Setting Plans for Discharge Living Setting: Patient's home;Lives with (comment) (lives with his parents in basement) Type of Home at Discharge: House Discharge Home Layout: Two level (lives in basement in his own space) Alternate Level Stairs-Number of Steps: flight of steps to basement (16 steps downstairs) Discharge Home Access: Stairs to enter Entrance Stairs-Rails: None Entrance Stairs-Number of Steps: 4-5 Does the patient have any problems obtaining your medications?:  (pt does not drive, but family can drive to get meds)  Social/Family/Support Systems Patient Roles:  (lives at home with parents) Contact Information: mother Jayln Madeira and dtr Francia Greaves are primary contacts Anticipated Caregiver: pt's parents Anticipated Caregiver's Contact Information: see above Ability/Limitations of Caregiver: no limitations, parents retired and home all the time Caregiver Availability: 24/7 Discharge Plan Discussed with Primary Caregiver: Yes (discussed with both mother and dtr) Is Caregiver  In Agreement with Plan?: Yes Does Caregiver/Family have Issues with Lodging/Transportation while Pt is in Rehab?: No  Goals/Additional Needs Patient/Family Goal for Rehab: supervision and Mod Ind with PT, OT and SLP Expected length of stay: 10-14 days Cultural Considerations: none Dietary Needs: 2 gram sodium diet, thin liquids Equipment Needs: to be determined Pt/Family Agrees to Admission and willing to participate: Yes (spoke with pt's mother and dtr on 7-24 and 7-27) Program Orientation Provided & Reviewed with Pt/Caregiver Including Roles  & Responsibilities: Yes   Decrease burden of Care through IP rehab admission: NA   Possible need for SNF placement upon discharge: not anticipated   Patient Condition: This patient's medical and functional status has changed since the consult dated: 07-01-14 in which the Rehabilitation Physician determined and documented that the patient's condition is appropriate for intensive rehabilitative care in an inpatient rehabilitation facility. See "History of Present Illness" (above) for medical update. Functional changes are: minimal to moderate assistance for transfers and minimal assistance with gait/balance issues.. Patient's medical and functional status update has been discussed with the Rehabilitation  physician and patient remains appropriate for inpatient rehabilitation. Will admit to inpatient rehab today.  Preadmission Screen Completed By:  Nanetta Batty, PT 07/05/2014 1:01 PM ______________________________________________________________________   Discussed status with Dr. Naaman Plummer on 07/05/14 at 1301 and received telephone approval for admission today.  Admission Coordinator:  Nanetta Batty, PT time 1301/Date 07/05/14

## 2014-07-05 NOTE — H&P (Signed)
Physical Medicine and Rehabilitation Admission H&P  Chief Complaint   Patient presents with   .  GI Bleeding   :  Chief complaint: Weakness  HPI: Edwin Love is a 53 y.o. right-handed male history of hypertension, grade 2 diastolic dysfunction, chronic renal insufficiency with baseline creatinine 3.43, blindness left eye, cirrhosis and esophageal varices. Patient independent with a cane living with his parents prior to admission. Admitted 06/24/2014 after being found lying in a pool of blood by his family.  CT abdomen and pelvis showed partial thrombosis of the portal vein, multiple new hepatic lesions noted as well as extensive small bowel wall edema. Hemoglobin upon admission 6.2 and was transfused. Underwent EGD 06/24/2014 negative for UGIB. Bleeding scan/colonoscopy positive for LGIB in the sigmoid colon per workup of gastroenterology Dr. [name].. Patient with persistent rectal bleeding underwent oversew of rectal varices per Dr. 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 Hospital course dysphagia currently on mechanical soft nectar thick liquids. Physical therapy and occupational evaluations completed 06/30/2014 with recommendations for physical medicine rehabilitation consult. Patient was admitted for comprehensive rehabilitation program    ROS Review of Systems  Eyes: Positive for pain.  Left eye blindness  Gastrointestinal: Positive for nausea, blood in stool and melena.  Musculoskeletal: Positive for back pain.  Psychiatric/Behavioral: Positive for depression.  All other systems reviewed and are negative  Past Medical History   Diagnosis  Date   .  Hypertension    .  Back pain    .  Renal disorder      CKD stage 3 noted in 02/2014 H & P   .  Anemia  01/2014   .  LVH (left ventricular hypertrophy)  02/2014     grade 2 diastolic dysfunction.   .  Ankle fracture, left  10/2008      non surgical mgt.   .  Thrombocytopenia  06/2014   .  Coagulopathy  06/2014    Past Surgical History   Procedure  Laterality  Date   .  Eye surgery  Left  1990's     Foreign body   .  Inguinal hernia repair  Right  02/08/2014     Procedure: HERNIA REPAIR INGUINAL INCARCERATED; Love: Edward Jolly, MD; Location: [place]; Service: General; Laterality: Right;   .  Insertion of mesh  Right  02/08/2014     Procedure: INSERTION OF MESH; Love: Edward Jolly, MD; Location: [place]; Service: General; Laterality: Right;   .  Hernia repair     .  Esophagogastroduodenoscopy  N/A  06/24/2014     Procedure: ESOPHAGOGASTRODUODENOSCOPY (EGD); Love: Inda Castle, MD; Location: [place]; Service: Endoscopy; Laterality: N/A;   .  Colonoscopy  N/A  06/25/2014     Procedure: COLONOSCOPY; Love: Inda Castle, MD; Location: [place]; Service: Endoscopy; Laterality: N/A;   .  Hemorrhoid surgery  N/A  06/25/2014     Procedure: oversew of rectal varices; Love: Harl Bowie, MD; Location: [place]; Service: General; Laterality: N/A;    Family History   Problem  Relation  Age of Onset   .  Diabetes  Brother      Mother too   .  Hypertension       family   .  Sickle cell anemia  Sister     Social History: reports that he has been smoking Cigarettes. He has a 17 pack-year smoking history. He does not have  any smokeless tobacco history on file. He reports that he drinks alcohol. He reports that he uses illicit drugs (Marijuana and Cocaine).  Allergies: No Known Allergies  Medications Prior to Admission   Medication  Sig  Dispense  Refill   .  amLODipine (NORVASC) 5 MG tablet  Take 1 tablet (5 mg total) by mouth daily.  30 tablet  3   .  carvedilol (COREG) 12.5 MG tablet  Take 12.5 mg by mouth 2 (two) times daily with a meal.     .  citalopram (CELEXA) 20 MG tablet  Take 20 mg by mouth daily.     .  ferrous sulfate 325 (65 FE) MG tablet  Take 325 mg by mouth 3 (three) times  daily.     .  hydrOXYzine (ATARAX/VISTARIL) 50 MG tablet  Take 50 mg by mouth daily at 10 pm.     .  ibuprofen (ADVIL,MOTRIN) 200 MG tablet  Take 400 mg by mouth every 6 (six) hours as needed for moderate pain.     Marland Kitchen  lisinopril (PRINIVIL,ZESTRIL) 10 MG tablet  Take 2 tablets (20 mg total) by mouth daily.  60 tablet  3   .  paliperidone (INVEGA) 6 MG 24 hr tablet  Take 6 mg by mouth daily.      Home:  Home Living  Family/patient expects to be discharged to:: Private residence  Living Arrangements: Parent  Available Help at Discharge: Family  Type of Home: House  Home Access: Stairs to enter  Technical brewer of Steps: 3  Entrance Stairs-Rails: Can reach both  [place]: Two level;Able to live on main level with bedroom/bathroom  Alternate Level Stairs-Number of Steps: 2 (to get to kitchen)  Alternate Level Stairs-Rails: None  Home Equipment: Cane - single point  Additional Comments: walk in shower  Functional History:  Prior Function  Level of Independence: Independent  Comments: occasional use of cane  Functional Status:  Mobility:  Bed Mobility  Overal bed mobility: Needs Assistance  Bed Mobility: Rolling;Sidelying to Sit  Rolling: Supervision  Sidelying to sit: Min assist  General bed mobility comments: HOB 0 with rail; vc for sequencing and initiation; assist to raise torso due to slow movement  Transfers  Overall transfer level: Needs assistance  Equipment used: Rolling walker (2 wheeled);None  Transfers: Sit to/from Stand  Sit to Stand: Mod assist  General transfer comment: x3 with and without RW; requires assist to advance forward over BOS  Ambulation/Gait  Ambulation/Gait assistance: Min assist  Ambulation Distance (Feet): 12 Feet  Assistive device: Rolling walker (2 wheeled)  Gait Pattern/deviations: Step-through pattern;Decreased stride length;Trunk flexed;Wide base of support  Gait velocity: very slow  Gait velocity interpretation: <1.8 ft/sec,  indicative of risk for recurrent falls  General Gait Details: pt unfamiliar with use of RW with vc and assist to maneuver and use safely; steady assist   ADL:   Cognition:  Cognition  Overall Cognitive Status: Impaired/Different from baseline (I'm still fuzzy and not fully myself)  Orientation Level: Oriented to person;Oriented to place;Oriented to time;Oriented to situation  Cognition  Arousal/Alertness: Awake/alert  Behavior During Therapy: WFL for tasks assessed/performed  Overall Cognitive Status: Impaired/Different from baseline (I'm still fuzzy and not fully myself)  Area of Impairment: Problem solving  Problem Solving: Slow processing;Decreased initiation     Physical Exam:  Blood pressure 118/77, pulse 86, temperature 98.6 F (37 C), temperature source Oral, resp. rate 21, height $RemoveBe'5\' 9"'geyCblqqW$  (1.753 m), weight 94 kg (207 lb 3.7 oz),  SpO2 98.00%.    Constitutional:  53 year old African American male appearing older than stated age  HENT:  Poor dentition. Oral mucosa moist Eyes: Conjunctivae and EOM are normal on right. LEFT eye enucleated Neck: Normal range of motion. Neck supple. No tracheal deviation present. No thyromegaly present.  Cardiovascular: Normal rate and regular rhythm.  Respiratory: Effort normal and breath sounds normal. No respiratory distress. Scattered wheezes GI: Bowel sounds are normal. He exhibits distension. Abdomen non-tender Neurological:  Patient is alert with flat affect. Seems to have only basic insight and awareness. He was appropriate for name, age date of birth. He did follow simple commands.  Moved all 4 limbs. Grossly 4- prox to 4/5 distally in UE's and 3+/5 prox HF to 4/5 with KE and ADF/APF. Sensed pain and light touch in all 4's.  Skin: Skin is warm and dry.  Psychiatric:  Flat, cooperative   Results for orders placed during the hospital encounter of 06/24/14 (from the past 48 hour(s))   GLUCOSE, CAPILLARY Status: Abnormal    Collection Time      06/29/14 1:00 PM   Result  Value  Ref Range    Glucose-Capillary  134 (*)  70 - 99 mg/dL   GLUCOSE, CAPILLARY Status: Abnormal    Collection Time    06/29/14 4:12 PM   Result  Value  Ref Range    Glucose-Capillary  150 (*)  70 - 99 mg/dL   BASIC METABOLIC PANEL Status: Abnormal    Collection Time    06/29/14 4:15 PM   Result  Value  Ref Range    Sodium  147  137 - 147 mEq/L    Potassium  3.4 (*)  3.7 - 5.3 mEq/L    Comment:  HEMOLYSIS AT THIS LEVEL MAY AFFECT RESULT    Chloride  103  96 - 112 mEq/L    CO2  27  19 - 32 mEq/L    Glucose, Bld  138 (*)  70 - 99 mg/dL    BUN  53 (*)  6 - 23 mg/dL    Creatinine, Ser  2.25 (*)  0.50 - 1.35 mg/dL    Calcium  6.8 (*)  8.4 - 10.5 mg/dL    GFR calc non Af Amer  32 (*)  >90 mL/min    GFR calc Af Amer  37 (*)  >90 mL/min    Comment:  (NOTE)     The eGFR has been calculated using the CKD EPI equation.     This calculation has not been validated in all clinical situations.     eGFR's persistently <90 mL/min signify possible Chronic Kidney     Disease.    Anion gap  17 (*)  5 - 15   MAGNESIUM Status: Abnormal    Collection Time    06/29/14 4:15 PM   Result  Value  Ref Range    Magnesium  1.3 (*)  1.5 - 2.5 mg/dL   PHOSPHORUS Status: None    Collection Time    06/29/14 4:15 PM   Result  Value  Ref Range    Phosphorus  3.2  2.3 - 4.6 mg/dL   GLUCOSE, CAPILLARY Status: Abnormal    Collection Time    06/29/14 9:03 PM   Result  Value  Ref Range    Glucose-Capillary  150 (*)  70 - 99 mg/dL    Comment 1  Notify RN    CBC Status: Abnormal    Collection Time    06/30/14 3:29  AM   Result  Value  Ref Range    WBC  8.9  4.0 - 10.5 K/uL    RBC  3.26 (*)  4.22 - 5.81 MIL/uL    Hemoglobin  9.7 (*)  13.0 - 17.0 g/dL    HCT  17.7 (*)  11.6 - 52.0 %    MCV  90.2  78.0 - 100.0 fL    MCH  29.8  26.0 - 34.0 pg    MCHC  33.0  30.0 - 36.0 g/dL    RDW  57.9 (*)  03.8 - 15.5 %    Platelets  35 (*)  150 - 400 K/uL   COMPREHENSIVE METABOLIC PANEL  Status: Abnormal    Collection Time    06/30/14 3:29 AM   Result  Value  Ref Range    Sodium  146  137 - 147 mEq/L    Potassium  2.7 (*)  3.7 - 5.3 mEq/L    Comment:  CRITICAL RESULT CALLED TO, READ BACK BY AND VERIFIED WITH:     R.YOCUM RN [id number] 06/30/14 E.GADDY    Chloride  102  96 - 112 mEq/L    CO2  32  19 - 32 mEq/L    Glucose, Bld  91  70 - 99 mg/dL    BUN  50 (*)  6 - 23 mg/dL    Creatinine, Ser  3.29 (*)  0.50 - 1.35 mg/dL    Calcium  7.0 (*)  8.4 - 10.5 mg/dL    Total Protein  5.5 (*)  6.0 - 8.3 g/dL    Albumin  2.1 (*)  3.5 - 5.2 g/dL    AST  191 (*)  0 - 37 U/L    ALT  250 (*)  0 - 53 U/L    Alkaline Phosphatase  131 (*)  39 - 117 U/L    Total Bilirubin  1.6 (*)  0.3 - 1.2 mg/dL    GFR calc non Af Amer  33 (*)  >90 mL/min    GFR calc Af Amer  39 (*)  >90 mL/min    Comment:  (NOTE)     The eGFR has been calculated using the CKD EPI equation.     This calculation has not been validated in all clinical situations.     eGFR's persistently <90 mL/min signify possible Chronic Kidney     Disease.    Anion gap  12  5 - 15   GLUCOSE, CAPILLARY Status: Abnormal    Collection Time    06/30/14 7:45 AM   Result  Value  Ref Range    Glucose-Capillary  110 (*)  70 - 99 mg/dL   GLUCOSE, CAPILLARY Status: Abnormal    Collection Time    06/30/14 11:47 AM   Result  Value  Ref Range    Glucose-Capillary  117 (*)  70 - 99 mg/dL   GLUCOSE, CAPILLARY Status: Abnormal    Collection Time    06/30/14 4:51 PM   Result  Value  Ref Range    Glucose-Capillary  143 (*)  70 - 99 mg/dL   GLUCOSE, CAPILLARY Status: Abnormal    Collection Time    06/30/14 9:49 PM   Result  Value  Ref Range    Glucose-Capillary  162 (*)  70 - 99 mg/dL   CBC Status: Abnormal    Collection Time    07/01/14 2:47 AM   Result  Value  Ref Range    WBC  7.1  4.0 - 10.5 K/uL    RBC  3.11 (*)  4.22 - 5.81 MIL/uL    Hemoglobin  9.2 (*)  13.0 - 17.0 g/dL    HCT  27.5 (*)  39.0 - 52.0 %    MCV  88.4  78.0 - 100.0 fL     MCH  29.6  26.0 - 34.0 pg    MCHC  33.5  30.0 - 36.0 g/dL    RDW  15.6 (*)  11.5 - 15.5 %    Platelets  54 (*)  150 - 400 K/uL    Comment:  CONSISTENT WITH PREVIOUS RESULT   BASIC METABOLIC PANEL Status: Abnormal    Collection Time    07/01/14 2:47 AM   Result  Value  Ref Range    Sodium  141  137 - 147 mEq/L    Potassium  3.2 (*)  3.7 - 5.3 mEq/L    Chloride  101  96 - 112 mEq/L    CO2  28  19 - 32 mEq/L    Glucose, Bld  82  70 - 99 mg/dL    BUN  49 (*)  6 - 23 mg/dL    Creatinine, Ser  2.01 (*)  0.50 - 1.35 mg/dL    Calcium  7.2 (*)  8.4 - 10.5 mg/dL    GFR calc non Af Amer  36 (*)  >90 mL/min    GFR calc Af Amer  42 (*)  >90 mL/min    Comment:  (NOTE)     The eGFR has been calculated using the CKD EPI equation.     This calculation has not been validated in all clinical situations.     eGFR's persistently <90 mL/min signify possible Chronic Kidney     Disease.    Anion gap  12  5 - 15   GLUCOSE, CAPILLARY Status: Abnormal    Collection Time    07/01/14 7:32 AM   Result  Value  Ref Range    Glucose-Capillary  158 (*)  70 - 99 mg/dL    Dg Chest Port 1 View  06/30/2014 CLINICAL DATA: Shortness of breath. EXAM: PORTABLE CHEST - 1 VIEW COMPARISON: 06/28/2014. 01/21/2014. FINDINGS: Interim removal of endotracheal tube and NG tube. Cardiomegaly. Pulmonary vascularity is slightly prominent. Previously identified pulmonary edema has partially cleared. No pleural effusion or pneumothorax. No acute osseus abnormality. IMPRESSION: 1. Interim removal of lines and tubes. 2. Interim partial clearing of pulmonary edema. Electronically Signed By: Marcello Moores Register On: 06/30/2014 07:34   Medical Problem List and Plan:  1. Functional deficits secondary to deconditioning due to GI bleed/multi-medical  2. DVT Prophylaxis/Anticoagulation: SCDs. Monitor for signs of DVT  3. Pain Management: Tylenol as needed  4. Mood/depression: Resume home regimen of Celexa 20 mg daily. Provide emotional support    5. Neuropsych: This patient is not capable of making decisions on his own behalf.  6. Anemia secondary to GI bleed. Status post transfusion/TIPS procedure with embolization. Latest hemoglobin 8.1. Followup CBC.  7. Dysphagia.  8. Hypertension. Coreg 6.25 mg twice a day. Monitor with increased mobility  9. Chronic renal insufficiency. Admission creatinine 3.43. Followup chemistries  10. Blindness left eye.  11. Grade 2 diastolic congestive heart failure. Monitor for any signs of fluid overload  12. Diabetes mellitus with peripheral neuropathy. Diet controlled. Check blood sugars a.c. and at bedtime. Continue sliding scale   Post Admission Physician Evaluation:  1. Functional deficits secondary to deconditioning related to GIB, multiple medical. 2. Patient  is admitted to receive collaborative, interdisciplinary care between the physiatrist, rehab nursing staff, and therapy team. 3. Patient's level of medical complexity and substantial therapy needs in context of that medical necessity cannot be provided at a lesser intensity of care such as a SNF. 4. Patient has experienced substantial functional loss from his/her baseline which was documented above under the "Functional History" and "Functional Status" headings. Judging by the patient's diagnosis, physical exam, and functional history, the patient has potential for functional progress which will result in measurable gains while on inpatient rehab. These gains will be of substantial and practical use upon discharge in facilitating mobility and self-care at the household level. 5. Physiatrist will provide 24 hour management of medical needs as well as oversight of the therapy plan/treatment and provide guidance as appropriate regarding the interaction of the two. 6. 24 hour rehab nursing will assist with bladder management, bowel management, safety, skin/wound care, disease management, medication administration, pain management and patient education  and help integrate therapy concepts, techniques,education, etc. 7. PT will assess and treat for/with: Lower extremity strength, range of motion, stamina, balance, functional mobility, safety, adaptive techniques and equipment, NMR, exercise tolerance, family ed. Goals are: mod I. 8. OT will assess and treat for/with: ADL's, functional mobility, safety, upper extremity strength, adaptive techniques and equipment, NMR, exercise tolerance, community reintegration. Goals are: mod I. 9. SLP will assess and treat for/with: cognition, communication, safety awareness. Goals are: mod I to supervision. 10. Case Management and Social Worker will assess and treat for psychological issues and discharge planning. 11. Team conference will be held weekly to assess progress toward goals and to determine barriers to discharge. 12. Patient will receive at least 3 hours of therapy per day at least 5 days per week. 13. ELOS: 7 days  14. Prognosis: good   Meredith Staggers, MD, [place] [facility]   07/01/2014

## 2014-07-05 NOTE — Progress Notes (Signed)
Pt discharged to 4W inpatient rehab. Pt transported by wheelchair, stable and off monitor. IV removed. Report given tp nurse receiving patient.  Jeanmarc Viernes M. Dalbert Batman, RN, BSN 07/05/2014 3:43 PM

## 2014-07-05 NOTE — Progress Notes (Signed)
Spoke with Dr Bonner Puna about pt's transfer to 6 floor. Informed him that the plan of care was to transfer him  to IR in the am. Transfer cancelled to [place] per Dr Bonner Puna.

## 2014-07-05 NOTE — Progress Notes (Signed)
Physical Therapy Treatment Patient Details Name: Edwin Love MRN: [id number] DOB: June 29, 1961 Today's Date: 07/05/2014    History of Present Illness Adm 06/24/14 after found down in pool of blood. +rectal varices bleeding with oversew surgery 07/02/23 (coded during colonoscopy prior to surgery with CPR), intubated 7/07-23-2021 (self-extubated). PMHx- ETOH abuse, cirrhosis, coagulopathy, Lt ankle fx    PT Comments    Pt much more alert today with improved tolerance for activity. Remains impulsive (although slightly less so). Pt reports his male cousin will be available to help him 24/7 as needed (lives with him).   Follow Up Recommendations  CIR;Supervision/Assistance - 24 hour     Equipment Recommendations  Rolling walker with 5" wheels    Recommendations for Other Services       Precautions / Restrictions Precautions Precautions: Fall Restrictions Weight Bearing Restrictions: No    Mobility  Bed Mobility                  Transfers Overall transfer level: Needs assistance Equipment used: None;Rolling walker (2 wheeled) Transfers: Sit to/from Stand Sit to Stand: Min guard         General transfer comment: remains impulsive, however slightly less; vc for proper sequencing with RW  Ambulation/Gait Ambulation/Gait assistance: Min assist Ambulation Distance (Feet): 200 Feet Assistive device: Rolling walker (2 wheeled);None Gait Pattern/deviations: Step-through pattern;Decreased stride length;Staggering right;Drifts right/left     General Gait Details: pt walking much better; attempted gait without RW with pt losing his balance with head turns (only turning 30 degrees); stagger to his Rt with min assist to recover; with RW requires vc for safe, proper use   Stairs            Wheelchair Mobility    Modified Rankin (Stroke Patients Only)       Balance Overall balance assessment: Needs assistance         Standing balance support: No upper  extremity supported Standing balance-Leahy Scale: Good           Rhomberg - Eyes Opened:  (minguard with anterior-posterior sway) Rhomberg - Eyes Closed:  (min A with incr sway; 20 seconds)        Cognition Arousal/Alertness: Awake/alert Behavior During Therapy: WFL for tasks assessed/performed Overall Cognitive Status: No family/caregiver present to determine baseline cognitive functioning Area of Impairment: Attention;Safety/judgement;Awareness   Current Attention Level: Selective     Safety/Judgement: Decreased awareness of safety;Decreased awareness of deficits Awareness: Intellectual Problem Solving: Slow processing General Comments: remains impulsive with poor safety awareness (especially of multiple lines/tubes)    Exercises Low Level/ICU Exercises Stabilized Bridging: 5 reps;Supine    General Comments General comments (skin integrity, edema, etc.): Pt eager to go home. Recognizes his balance is not yet to his previous baseline.       Pertinent Vitals/Pain SaO2 99% on RA with walking    Home Living                      Prior Function            PT Goals (current goals can now be found in the care plan section) Acute Rehab PT Goals Patient Stated Goal: agrees wants to get stronger Progress towards PT goals: Progressing toward goals    Frequency  Min 3X/week    PT Plan Current plan remains appropriate    Co-evaluation             End of Session Equipment Utilized During Treatment: Gait belt Activity  Tolerance: Patient tolerated treatment well Patient left: with call bell/phone within reach;in chair;with chair alarm set     Time: 0964-3838 PT Time Calculation (min): 16 min  Charges:  $Gait Training: 8-22 mins                    G Codes:      Valencia Kassa July 07, 2014, 12:19 PM Pager [phone]

## 2014-07-05 NOTE — Progress Notes (Signed)
Stable from IR standpoint. IR team will arrange follow up appt with Dr. Laurence Ferrari including 'TIPS [place]' in 6 weeks.  Ascencion Dike PA-C Interventional Radiology 07/05/2014 2:11 PM

## 2014-07-05 NOTE — Progress Notes (Signed)
[facility] Admission Coordinator Signed Physical Medicine and Rehabilitation PMR Pre-admission Service date: 07/05/2014 1:01 PM  Related encounter: Admission (Discharged) from 06/24/2014 in [place]   PMR Admission Coordinator Pre-Admission Assessment  Patient: Edwin Love is an 53 y.o., male  MRN: [id number]  DOB: October 18, 1961  Height: 5\' 9"  (175.3 cm)  Weight: 96.5 kg (212 lb 11.9 oz)  Insurance Information  HMO: PPO: PCP: IPA: 80/20: OTHER:  PRIMARY: Medicaid [place] Access Policy#: [id number] o Subscriber: self  CM Name: Phone#: Fax#:  Pre-Cert#: Employer: not employed  Benefits: Phone #: [phone] Name:  Eff. Date: verified eligibility on 07-05-14 Deduct: Out of Pocket Max: Life Max:  CIR: covered SNF: covered  Outpatient: covered Co-Pay:  Home Health: covered Co-Pay:  DME: covered Co-Pay:  Providers: in network   Emergency Contact Information    Contact Information     Name  Relation  Home  Work  Mobile     [place]  Mother  [phone]       Hall,Carol  Daughter    [phone]        Current Medical History  Patient Admitting Diagnosis: deconditioning due to GIB, multiple medical  History of Present Illness: Edwin Love is a 53 y.o. right-handed male history of hypertension, grade 2 diastolic dysfunction, chronic renal insufficiency with baseline creatinine 3.43, blindness left eye, cirrhosis and esophageal varices. Patient independent with a cane living with his parents prior to admission. Admitted 06/24/2014 after being found lying in a pool of blood by his family.  CT abdomen and pelvis showed partial thrombosis of the portal vein, multiple new hepatic lesions noted as well as extensive small bowel wall edema. Hemoglobin upon admission 6.2 and was transfused. Underwent EGD 06/24/2014 negative for UGIB. Bleeding scan/colonoscopy positive for LGIB in the sigmoid colon per workup of gastroenterology Dr.  [name].. Patient with persistent rectal bleeding underwent oversew of rectal varices per Dr. Coralie Keens 06/25/2014. Hospital course dysphagia currently on mechanical soft nectar thick liquids. Physical therapy evaluation completed 06/30/2014 with recommendations for physical medicine rehabilitation consult. TIPS completed on 07-02-14 and received medical clearance from family medicine service for inpatient rehab on 07-05-14.  Past Medical History    Past Medical History    Diagnosis  Date    .  Hypertension     .  Back pain     .  Renal disorder       CKD stage 3 noted in 02/2014 [facility] & [facility]    .  Anemia  01/2014    .  LVH (left ventricular hypertrophy)  02/2014      grade 2 diastolic dysfunction.    .  Ankle fracture, left  10/2008      non surgical mgt.    .  Thrombocytopenia  06/2014    .  Coagulopathy  06/2014     Family History  family history includes Diabetes in his brother; Hypertension in an other family member; Sickle cell anemia in his sister.  Prior Rehab/Hospitalizations: none  Current Medications  Current facility-administered medications:0.9 % sodium chloride infusion, 250 mL, Intravenous, PRN, Rahul P Desai, PA-C; albuterol (PROVENTIL) (2.5 MG/3ML) 0.083% nebulizer solution 2.5 mg, 2.5 mg, Nebulization, Q4H PRN, Tammy S Parrett, NP; antiseptic oral rinse (BIOTENE) solution 15 mL, 15 mL, Mouth Rinse, q12n4p, Kara Mead V, MD, 15 mL at 07/05/14 1200  carvedilol (COREG) tablet 6.25 mg, 6.25 mg, Oral, BID WC, Archie Patten, MD, 6.25 mg at 07/05/14 0903; chlorhexidine (PERIDEX) 0.12 %  solution 15 mL, 15 mL, Mouth Rinse, BID, Kara Mead V, MD, 15 mL at 07/05/14 0902; feeding supplement (GLUCERNA SHAKE) (GLUCERNA SHAKE) liquid 237 mL, 237 mL, Oral, BID BM, Rogue Bussing, RD, 237 mL at 61/60/73 7106  folic acid (FOLVITE) tablet 1 mg, 1 mg, Oral, Daily, Collene Gobble, MD, 1 mg at 07/05/14 0902; hydrALAZINE (APRESOLINE) injection 10-40 mg, 10-40 mg, Intravenous, Q4H PRN, Elsie Stain, MD, 10 mg at 07/02/14 2127; insulin aspart (novoLOG) injection 0-9 Units, 0-9 Units, Subcutaneous, TID AC & HS, Frazier Richards, MD, 2 Units at 07/05/14 1250  pantoprazole (PROTONIX) EC tablet 40 mg, 40 mg, Oral, Q0600, Alveda Reasons, MD, 40 mg at 07/05/14 2694; RESOURCE THICKENUP CLEAR, , Oral, PRN, Raylene Miyamoto, MD; sodium chloride 0.9 % injection 3 mL, 3 mL, Intravenous, Q12H, Vance Gather, MD, 3 mL at 07/05/14 8546; thiamine (VITAMIN B-1) tablet 100 mg, 100 mg, Oral, Daily, Collene Gobble, MD, 100 mg at 07/05/14 2703  Patients Current Diet: Sodium Restricted  Precautions / Restrictions  Precautions  Precautions: Fall  Restrictions  Weight Bearing Restrictions: No  Prior Activity Level  Community (5-7x/wk): pt got out everyday and walked every where to nearby stores. Pt cooks and cleans his basement living area.  Home Assistive Devices / Equipment  Home Assistive Devices/Equipment: Gilford Rile (specify type)  Home Equipment: Cane - single point  Prior Functional Level  Prior Function  Level of Independence: Independent  Comments: occasional use of cane; Pt reports he does not drive, and does not work  Current Secondary school teacher  Overall Cognitive Status: No family/caregiver present to determine baseline cognitive functioning  Current Attention Level: Selective  Orientation Level: Oriented to person;Oriented to place;Oriented to time  Safety/Judgement: Decreased awareness of safety;Decreased awareness of deficits  General Comments: remains impulsive with poor safety awareness (especially of multiple lines/tubes)    Extremity Assessment  (includes Sensation/Coordination)      ADLs  Overall ADL's : Needs assistance/impaired  Eating/Feeding: Supervision/ safety;Sitting  Grooming: Wash/dry hands;Wash/dry face;Oral care;Minimal assistance;Standing  Upper Body Bathing: Minimal assitance;Sitting  Lower Body Bathing: Moderate assistance;Sit to/from stand  Upper Body  Dressing : Moderate assistance;Sitting  Lower Body Dressing: Maximal assistance;Sit to/from stand  Toilet Transfer: Minimal assistance;+2 for physical assistance;Ambulation;Comfort height toilet  Toilet Transfer Details (indicate cue type and reason): Requires assist to line up correctly with toilet and assist to control descent  Toileting- Clothing Manipulation and Hygiene: Moderate assistance;Sit to/from stand  Functional mobility during ADLs: Minimal assistance;+2 for physical assistance  General ADL Comments: Pt self distracts easily. Began to doff socks when prompted, but lost focus and forgot to complete task    Mobility  Overal bed mobility: Needs Assistance  Bed Mobility: Rolling;Sidelying to Sit;Sit to Supine  Rolling: Supervision  Sidelying to sit: Min assist  Supine to sit: Min assist  Sit to supine: Min assist  General bed mobility comments: pt suddenly needed to go to [facility] and began coming to EOB with no regard for his lines/tubes; HOB 20 with rail    Transfers  Overall transfer level: Needs assistance  Equipment used: None;Rolling walker (2 wheeled)  Transfers: Sit to/from Stand  Sit to Stand: Min guard  Stand pivot transfers: Mod assist;Min assist  General transfer comment: remains impulsive, however slightly less; vc for proper sequencing with RW    Ambulation / Gait / Stairs / Wheelchair Mobility  Ambulation/Gait  Ambulation/Gait assistance: Min Restaurant manager, fast food (Feet): 200 Feet  Assistive  device: Rolling walker (2 wheeled);None  Gait Pattern/deviations: Step-through pattern;Decreased stride length;Staggering right;Drifts right/left  Gait velocity: very slow  Gait velocity interpretation: <1.8 ft/sec, indicative of risk for recurrent falls  General Gait Details: pt walking much better; attempted gait without RW with pt losing his balance with head turns (only turning 30 degrees); stagger to his Rt with min assist to recover; with RW requires vc for safe, proper  use    Posture / Balance  Static Standing Balance  Rhomberg - Eyes Opened: (minguard with anterior-posterior sway)  Rhomberg - Eyes Closed: (min A with incr sway; 20 seconds)    Special needs/care consideration  BiPAP/CPAP no  CPM no  Continuous Drip IV no  Dialysis no  Life Vest no  Oxygen no  Special Bed no  Trach Size no  Wound Vac (area) no  Skin - no issues  Bowel mgmt: last BM on 07-05-14  Bladder mgmt: currently using urinal  Diabetic mgmt - no formal DM diagnosis prior to admit but being managed with insulin while hospitalized    Previous Home Environment  Living Arrangements: Parent (mother)  Available Help at Discharge: Family  Type of Home: House  Home Layout: Two level;Able to live on main level with bedroom/bathroom  Alternate Level Stairs-Rails: None  Alternate Level Stairs-Number of Steps: 2 (to get to kitchen)  Home Access: Stairs to enter  Entrance Stairs-Rails: Can reach both  Entrance Stairs-Number of Steps: 3  Bathroom Shower/Tub: Administrator, Civil Service: Standard  Home Care Services: No  Additional Comments: walk in shower  Discharge Living Setting  Plans for Discharge Living Setting: Patient's home;Lives with (comment) (lives with his parents in basement)  Type of Home at Discharge: House  Discharge Home Layout: Two level (lives in basement in his own space)  Alternate Level Stairs-Number of Steps: flight of steps to basement (16 steps downstairs)  Discharge Home Access: Stairs to enter  Entrance Stairs-Rails: None  Entrance Stairs-Number of Steps: 4-5  Does the patient have any problems obtaining your medications?: (pt does not drive, but family can drive to get meds)  Social/Family/Support Systems  Patient Roles: (lives at home with parents)  Contact Information: mother Marston Mccadden and dtr Francia Greaves are primary contacts  Anticipated Caregiver: pt's parents  Anticipated Caregiver's Contact Information: see above  Ability/Limitations of  Caregiver: no limitations, parents retired and home all the time  Caregiver Availability: 24/7  Discharge Plan Discussed with Primary Caregiver: Yes (discussed with both mother and dtr)  Is Caregiver In Agreement with Plan?: Yes  Does Caregiver/Family have Issues with Lodging/Transportation while Pt is in Rehab?: No  Goals/Additional Needs  Patient/Family Goal for Rehab: supervision and Mod Ind with PT, OT and SLP  Expected length of stay: 10-14 days  Cultural Considerations: none  Dietary Needs: 2 gram sodium diet, thin liquids  Equipment Needs: to be determined  Pt/Family Agrees to Admission and willing to participate: Yes (spoke with pt's mother and dtr on 7-24 and 7-27)  Program Orientation Provided & Reviewed with Pt/Caregiver Including Roles & Responsibilities: Yes  Decrease burden of Care through IP rehab admission: NA  Possible need for SNF placement upon discharge: not anticipated  Patient Condition: This patient's medical and functional status has changed since the consult dated: 07-01-14 in which the Rehabilitation Physician determined and documented that the patient's condition is appropriate for intensive rehabilitative care in an inpatient rehabilitation facility. See "History of Present Illness" (above) for medical update. Functional changes are: minimal to moderate assistance  for transfers and minimal assistance with gait/balance issues.. Patient's medical and functional status update has been discussed with the Rehabilitation physician and patient remains appropriate for inpatient rehabilitation. Will admit to inpatient rehab today.  Preadmission Screen Completed By: Nanetta Batty, PT 07/05/2014 1:01 PM  ______________________________________________________________________  Discussed status with Dr. Naaman Plummer on 07/05/14 at 1301 and received telephone approval for admission today.  Admission Coordinator: Nanetta Batty, PT time 1301/Date 07/05/14    Cosigned by: Meredith Staggers, MD [07/05/2014 1:37 PM]

## 2014-07-05 NOTE — Progress Notes (Signed)
Family Medicine Teaching Service Daily Progress Note Intern Pager: [phone]  Patient name: Edwin Love Medical record number: [id number] Date of birth: May 24, 1961 Age: 53 y.o. Gender: male  Primary Care Provider: No primary provider on file. Consultants: PCCM, GI, IR Code Status: DNR  Pt Overview and Major Events to Date:  7/16: Pt presented with acute GI bleed with a Hb of 6.2. 2u pRBCs. EtOH 131 on arrival. Abd CT showed cirrhosis, multiple new hepatic lesions, partial thrombosis of portal vein, portal HTN with multiple varices, periportal adenopathy cannot be excluded, umbilical hernia with herniation of small bowel, extensive small bowel wall edema.  Endoscopy performed: varices however negative for upper GI bleed. Bleeding scan with a likely source in the sigmoid colon. 2uFFP and vitamin K given. Continued bleeding. 2u pRBCs and L IJ cordis placement. Right arterial line placement.  7/17: Intubated secondary to shock, AMS, and respiratory insufficiency. Levophed started. IR visceral arteriography of IMA, SMA, and left gastric artery: no acute arterial bleeding source found . Colonoscopy resulting in  cardiac arrest with PEA/Asystole with CPR. Emergency OR: Oversew of bleeding rectal varices. Ceftriaxone. Levophed started.  7/18: Worsening metabolic acidosis. HCO3 infusion increased. 1u pRBCs, 1u platelets, 4u FFP. Levophed discontinued in PM 7/19: 1u pRBC, 4 u FFP. Acute overcompensated met alkalosis. Bicarb GTT discontinued and vent settings changed. 7/20: Improving  7/21: Pt self-extubated, dysphagia 3 diet, transferred out of ICU 7/24: TIPs, stenting of the main portal vein, coil embolization of porto-systemic shunt; CT scan reviewed (portal vein thrombosis).  No anticoagulation per GI  Assessment and Plan: 53 y/o male with a PMH of alcohol abuse presenting with copious BRBPR found to have large rectal varices s/p oversewing of those varices (7/17).  TIPs procedure, extended  stenting of the main portal vein (h/o partial portal thrombosis), and coil emobolization of mesenteric varices on 7/24.  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 S/p ligation of rectal varices on 7/17 (surgery now signed off).  S/P TIPs on 7/24. - GI following, appreciate recs.  Continue PO protonix - 7 day course of Ceftriaxone completed. - No reported bleeding. Hb trending downward today 8.4 >> 8.1.  - Will continue to monitor closely.   Cirrhosis of the liver:  Secondary to EtOH abuse. Patient also noted to have a new diagnosis of HCV (+Ab and RNA). - AST/ALT continuing to trend downward. - Continue Thiamine/Folic acid in setting of known ETOH abuse. - Continue Coreg for varices at decreased dose, 6.90m BID  Leukocytosis - WBC count stable- 16.0>>15.4 - No current signs of infection and patient afebrile. - Likely due to stress from recent procedure. - Will continue to monitor/trend   Hepatitis C: New diagnosis - ID Consult likely in outpatient setting regarding treatment options.  Encephalopathy - Currently resolved, however could worsen s/p TIPS. - Lactulose BID. May increase to TID to achieve 2 BMs per day - If encephalopathic in the setting of 2 BMs per day, consider beginning rifaximin - Continue to monitor closely.   Acute kidney injury superimposed on CKD stage III: Baseline creatinine 0.9-1.4. On admission it was 1.6 but increased to max of 3.8 in setting of shock. - Continuing to improve. Down to 1.65. - There was concern for contrast induced nephropathy s/p TIPs, however creatinine has remained stable >48hrs post-op.  Hypokalemia - No longer checking BMETs as pt ready to be transferred to CIR. - May consider daily or every  other day potassium repletion while on lactulose   Thrombocytopenia: Secondary to liver disease. - Continues to rise @ 119. - Cntinue to monitor.  New hepatic  lesions noted on CT: Concerning for hepatocellular carcinoma as AFP was 2.8.  - Workup/follow up in outpatient setting.   Diastolic HF: Echocardiogram showed EF of 55-60% with grade 2 diastolic dysfunction - Currently euvolemic. Will continue to assess volume status daily.   Hypertension: - BP well controlled currently - 138/83. - Continuing to hold home amlodpine - Continuing coreg, if no episodes of hypotension consider increasing back to 12.64m BID  FEN/GI: 2 gram sodium diet, NS IV lock PPx: SCD's; Holding anticoagulants in setting of prior bleed per GI  Disposition: Planning CIR. Hopefully Monday.  Subjective:  Patient doing well overnight. Note he has about 3 brown BMs daily currently. Eating well without N/V.  Denies any abdominal pain, chest pain, SOB, or active bleeding. Eager to go "up to the 4th floor" as he's tired of sitting so much.   Objective: Temp:  [97.9 F (36.6 C)-99.4 F (37.4 C)] 99 F (37.2 C) (07/27 0330) Pulse Rate:  [79-85] 79 (07/27 0330) Resp:  [18-25] 21 (07/27 0330) BP: (124-142)/(64-83) 138/83 mmHg (07/27 0330) SpO2:  [100 %] 100 % (07/27 0330)  Physical Exam: General: Sitting up in the chair at bedside, NAD. Pleasant and cooperative. Cardiovascular: Regular rate and rhythm. 2/6 Systolic murmur appreciated. Respiratory: CTAB. No rales, rhonchi or wheezing noted. Abdomen: +BS, soft, nontender, nondistended.  Extremities: Trace pitting edema of the LE bilaterally. Skin: No rashes noted. 4x293msoft, compressible, flesh colored papule vs blister on the left forearm at the area of old tape. Neuro: AO x 4. No focal deficits.  Laboratory:  Recent Labs Lab 07/04/14 0318 07/04/14 1250 07/05/14 0459  WBC 16.0* 16.1* 15.4*  HGB 7.8* 8.4* 8.1*  HCT 23.5* 25.4* 24.5*  PLT 111* 114* 119*    Recent Labs Lab 07/02/14 0010 07/03/14 0255 07/04/14 0318  NA 142 144 143  K 3.4* 3.9 3.5*  CL 102 105 105  CO2 _0 BUN 44* 37* 28*  CREATININE  1.96* 1.73* 1.65*  CALCIUM 7.3* 7.8* 7.4*  PROT 5.4* 5.0* 4.8*  BILITOT 1.1 4.2* 1.9*  ALKPHOS 147* 107 103  ALT 126* 68* 36  AST 110* 135* 62*  GLUCOSE 184* 105* 117*   RPR neg Heb B Ab neg HCV Ab Reactive, HCV RNA positive  HIV non-reactive   Imaging/Diagnostic Tests: 7/16 Abd CTMG:[id number] new hepatic lesions are noted, partial thrombosis of portal vein, portal HTN with multiple varices, periportal adenopathy cannot be excluded, umbilical hernia with herniation of small bowel, extensive small bowel wall edema.   7/16 EGD: varices present; however, no acute bleeding.   7/16 Bleeding Scan:positive for acute lower GI bleed with source likely within the sigmoid colon.   7/17 Colonoscopy: Large varix with ulceration of the tip in the rectal vault. Clips placed and injection of ethanolamine, however study was stopped due to hypotension and arrhythmia.   7/20 CXR: Line and tube positions stable. Congestive heart failure with pulmonary edema.  7/22 CXR: Interim removal of lines and tubes. Interim partial clearing of pulmonary edema.  CrArchie PattenMD Family Medicine PGY-1 Contact: FPHalesitentern pager - [phone] pages welcome

## 2014-07-06 ENCOUNTER — Inpatient Hospital Stay (HOSPITAL_COMMUNITY): Payer: Medicaid Other

## 2014-07-06 ENCOUNTER — Encounter (HOSPITAL_COMMUNITY): Payer: Self-pay | Admitting: Interventional Radiology

## 2014-07-06 ENCOUNTER — Inpatient Hospital Stay (HOSPITAL_COMMUNITY): Payer: Medicaid Other | Admitting: Occupational Therapy

## 2014-07-06 ENCOUNTER — Inpatient Hospital Stay (HOSPITAL_COMMUNITY): Payer: Medicaid Other | Admitting: Speech Pathology

## 2014-07-06 ENCOUNTER — Inpatient Hospital Stay (HOSPITAL_COMMUNITY): Payer: Medicaid Other | Admitting: Physical Therapy

## 2014-07-06 DIAGNOSIS — F3289 Other specified depressive episodes: Secondary | ICD-10-CM

## 2014-07-06 DIAGNOSIS — J9600 Acute respiratory failure, unspecified whether with hypoxia or hypercapnia: Secondary | ICD-10-CM

## 2014-07-06 DIAGNOSIS — K922 Gastrointestinal hemorrhage, unspecified: Secondary | ICD-10-CM

## 2014-07-06 DIAGNOSIS — F329 Major depressive disorder, single episode, unspecified: Secondary | ICD-10-CM

## 2014-07-06 DIAGNOSIS — R5381 Other malaise: Secondary | ICD-10-CM

## 2014-07-06 LAB — COMPREHENSIVE METABOLIC PANEL
ALT: 25 U/L (ref 0–53)
ANION GAP: 12 (ref 5–15)
AST: 41 U/L — ABNORMAL HIGH (ref 0–37)
Albumin: 1.7 g/dL — ABNORMAL LOW (ref 3.5–5.2)
Alkaline Phosphatase: 116 U/L (ref 39–117)
BUN: 20 mg/dL (ref 6–23)
CALCIUM: 7.2 mg/dL — AB (ref 8.4–10.5)
CO2: 22 mEq/L (ref 19–32)
Chloride: 106 mEq/L (ref 96–112)
Creatinine, Ser: 1.4 mg/dL — ABNORMAL HIGH (ref 0.50–1.35)
GFR calc non Af Amer: 56 mL/min — ABNORMAL LOW (ref >90)
GFR, EST AFRICAN AMERICAN: 65 mL/min — AB (ref >90)
GLUCOSE: 89 mg/dL (ref 70–99)
POTASSIUM: 3.9 meq/L (ref 3.7–5.3)
Sodium: 140 mEq/L (ref 137–147)
TOTAL PROTEIN: 5.3 g/dL — AB (ref 6.0–8.3)
Total Bilirubin: 2.2 mg/dL — ABNORMAL HIGH (ref 0.3–1.2)

## 2014-07-06 LAB — CBC WITH DIFFERENTIAL/PLATELET
BASOS ABS: 0.1 10*3/uL (ref 0.0–0.1)
Basophils Relative: 1 % (ref 0–1)
EOS ABS: 0.1 10*3/uL (ref 0.0–0.7)
Eosinophils Relative: 1 % (ref 0–5)
HEMATOCRIT: 24.5 % — AB (ref 39.0–52.0)
Hemoglobin: 8.2 g/dL — ABNORMAL LOW (ref 13.0–17.0)
Lymphocytes Relative: 10 % — ABNORMAL LOW (ref 12–46)
Lymphs Abs: 1.3 10*3/uL (ref 0.7–4.0)
MCH: 30.1 pg (ref 26.0–34.0)
MCHC: 33.5 g/dL (ref 30.0–36.0)
MCV: 90.1 fL (ref 78.0–100.0)
Monocytes Absolute: 1.3 10*3/uL — ABNORMAL HIGH (ref 0.1–1.0)
Monocytes Relative: 10 % (ref 3–12)
NEUTROS ABS: 10 10*3/uL — AB (ref 1.7–7.7)
Neutrophils Relative %: 78 % — ABNORMAL HIGH (ref 43–77)
Platelets: 123 10*3/uL — ABNORMAL LOW (ref 150–400)
RBC: 2.72 MIL/uL — ABNORMAL LOW (ref 4.22–5.81)
RDW: 15.5 % (ref 11.5–15.5)
WBC: 12.8 10*3/uL — ABNORMAL HIGH (ref 4.0–10.5)

## 2014-07-06 LAB — GLUCOSE, CAPILLARY
GLUCOSE-CAPILLARY: 100 mg/dL — AB (ref 70–99)
Glucose-Capillary: 97 mg/dL (ref 70–99)
Glucose-Capillary: 114 mg/dL — ABNORMAL HIGH (ref 70–99)
Glucose-Capillary: 125 mg/dL — ABNORMAL HIGH (ref 70–99)

## 2014-07-06 NOTE — Progress Notes (Signed)
Social Work Assessment and Plan Social Work Assessment and Plan  Patient Details  Name: Edwin Love MRN: [id number] Date of Birth: 06-09-1961  Today's Date: 07/06/2014  Problem List:  Patient Active Problem List   Diagnosis Date Noted  . Physical deconditioning 07/05/2014  . Hemorrhagic shock 06/26/2014  . Acute respiratory failure 06/26/2014  . Acute kidney injury 06/26/2014  . 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 Hypotension, unspecified 06/25/2014  . Acute posthemorrhagic anemia 06/24/2014  . Blood in stool 06/24/2014  . Alcoholic cirrhosis 39/76/7341  . Esophageal varices without bleeding 06/24/2014  . GI bleed 06/24/2014  . Lower GI bleed 06/24/2014  . Incarcerated inguinal hernia 02/08/2014  . Chest pain 01/29/2014  . Tachycardia 01/29/2014  . CKD (chronic kidney disease) stage 3, GFR 30-59 ml/min 01/29/2014  . Tobacco abuse 01/29/2014  . Depression 01/29/2014   Past Medical History:  Past Medical History  Diagnosis Date  . Hypertension   . Back pain   . Renal disorder     CKD stage 3 noted in 02/2014 H & P  . Anemia 01/2014  . LVH (left ventricular hypertrophy) 02/2014    grade 2 diastolic dysfunction.   . Ankle fracture, left 10/2008    non surgical mgt.   . Thrombocytopenia 06/2014  . Coagulopathy 06/2014   Past Surgical History:  Past Surgical History  Procedure Laterality Date  . Eye surgery Left 1990's    Foreign body  . Inguinal hernia repair Right 02/08/2014    Procedure: HERNIA REPAIR INGUINAL INCARCERATED;  Surgeon: Edward Jolly, MD;  Location: [place];  Service: General;  Laterality: Right;  . Insertion of mesh Right 02/08/2014    Procedure: INSERTION OF MESH;  Surgeon: Edward Jolly, MD;  Location: [place];  Service: General;  Laterality: Right;  . Hernia repair    . Esophagogastroduodenoscopy N/A 06/24/2014    Procedure: ESOPHAGOGASTRODUODENOSCOPY (EGD);  Surgeon: Inda Castle, MD;  Location: [place];  Service: Endoscopy;   Laterality: N/A;  . Colonoscopy N/A 06/25/2014    Procedure: COLONOSCOPY;  Surgeon: Inda Castle, MD;  Location: [place];  Service: Endoscopy;  Laterality: N/A;  . Hemorrhoid surgery N/A 06/25/2014    Procedure: oversew of rectal varices;  Surgeon: Harl Bowie, MD;  Location: [place];  Service: General;  Laterality: N/A;  . Radiology with anesthesia N/A 07/02/2014    Procedure: RADIOLOGY WITH ANESTHESIA;  Surgeon: Jacqulynn Cadet, MD;  Location: [place];  Service: Radiology;  Laterality: N/A;   Social History:  reports that he has been smoking Cigarettes.  He has a 17 pack-year smoking history. He does not have any smokeless tobacco history on file. He reports that he drinks alcohol. He reports that he uses illicit drugs (Marijuana and Cocaine).  Family / Support Systems Marital Status: Single Patient Roles: Parent;Other (Comment) (Son) Children: Arbie Cookey Hall-daughter  [phone] Other Supports: Mary-Mom  [phone]   Anticipated Caregiver: Self Ability/Limitations of Caregiver: Pt lives wiht his elderly parents who can not assist him, they are there but can not provide assistance Caregiver Availability: Other (Comment) (parents are there but upstairs-pt is in the basement) Family Dynamics: Close knit family who take care of one another.  Pt and another brother also live with pt's parents.  They take care of themselves and feel this is a realistic goal for pt while here.  Social History Preferred language: English Religion: Christian Cultural Background: No issues Education: High School Read: Yes Write: Yes Employment Status: Disabled Freight forwarder Issues: No  issues Guardian/Conservator: None-according to MD pt is not capable of making his own decisions while here.  Will look toward his daughter since she is his next of kin, no formal POA  in place.  Discussed they should disucss this and have and pt should appoint one.   Abuse/Neglect Physical Abuse:  Denies Verbal Abuse: Denies Sexual Abuse: Denies Exploitation of patient/patient's resources: Denies Self-Neglect: Denies  Emotional Status Pt's affect, behavior adn adjustment status: Pt is wanting to go home soon.  He feels he can do at home what he is doing here and not be so tired.  His parents report he does what he wants at home and moves when he wants too.  They would like him to be stronger before he comes home.  Mom reports: " He must be feeling better he is getting back to his old self." Recent Psychosocial Issues: Multiple medical issues- he manages to stay independent Pyschiatric History: History of depression has attended [facility] before, gets depression meds from them.  Unsure if he feels they help him or not.  He continues to drink and uses other drugs, unsure if he plans to quit them. Substance Abuse History: ETOH, Cocaine, Marjuana-he is unsure if he will quit, aware of the resources in the community and doesn't want to pursue them at this time.  Patient / Family Perceptions, Expectations & Goals Pt/Family understanding of illness & functional limitations: Pt and family can explain reason pt is here.  They are glad he is doing so well and improving so quickly.  Informed them team feels he may be ready as early as Friday to come home.  Pt states: " The earlier the better." Premorbid pt/family roles/activities: Son, Brother, Father, retiree, etc Anticipated changes in roles/activities/participation: resume Pt/family expectations/goals: Pt states: " I want to go home soon, by Friday."    Mother states: " I want him to be ready and be able to do for himself."  [employer]: Other (Comment) (Had in past) Premorbid Home Care/DME Agencies: Other (Comment) (had in past) Transportation available at discharge: Family Resource referrals recommended: Support group (specify) (AA or NA )  Discharge Planning Living Arrangements: Parent;Other relatives Support  Systems: Children;Parent;Other relatives;Friends/neighbors Type of Residence: Private residence Insurance Resources: Medicaid (specify county) Sports coach Co) Museum/gallery curator Resources: SSI Financial Screen Referred: Previously completed Living Expenses: Lives with family Money Management: Family Does the patient have any problems obtaining your medications?: No Home Management: Patient does his own area, Mom does the other part of the house Patient/Family Preliminary Plans: Return to parent's home, where he needs to be mod/i level.  He ambulated with a cane prior to admission.  Numerous family memebrs here who are supportive and glad he is doing so well.  Will be short length of stay. Social Work Anticipated Follow Up Needs: HH/OP;Support Group  Clinical Impression Pleasant somewhat grouchy patient, who is wanting to go home.  He lives with his elderly parents who can not assist, so he needs to be mod/i level.  He is not interested in substance abuse resources and plans to go back to his is previous ways.  Will be short length of stay and aware of team conference tomorrow.  Have informed possibility of discharge Friday-will confirm tomorrow.  Elease Hashimoto 07/06/2014, 2:54 PM

## 2014-07-06 NOTE — Progress Notes (Signed)
Patient information reviewed and entered into eRehab system by Nagee Goates, RN, CRRN, PPS Coordinator.  Information including medical coding and functional independence measure will be reviewed and updated through discharge.    

## 2014-07-06 NOTE — Evaluation (Signed)
Physical Therapy Assessment and Plan  Patient Details  Name: Edwin Love MRN: [id number] Date of Birth: 10-12-1961  PT Diagnosis: Abnormal posture and Muscle weakness, Impaired balance, Decreased functional endurance Rehab Potential: Good ELOS: 7 days   Today's Date: 07/06/2014 Time: 6060-0459 Time Calculation (min): 60 min  Problem List:  Patient Active Problem List   Diagnosis Date Noted  . Physical deconditioning 07/05/2014  . Hemorrhagic shock 06/26/2014  . Acute respiratory failure 06/26/2014  . Acute kidney injury 06/26/2014  . 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 Hypotension, unspecified 06/25/2014  . Acute posthemorrhagic anemia 06/24/2014  . Blood in stool 06/24/2014  . Alcoholic cirrhosis 97/74/1423  . Esophageal varices without bleeding 06/24/2014  . GI bleed 06/24/2014  . Lower GI bleed 06/24/2014  . Incarcerated inguinal hernia 02/08/2014  . Chest pain 01/29/2014  . Tachycardia 01/29/2014  . CKD (chronic kidney disease) stage 3, GFR 30-59 ml/min 01/29/2014  . Tobacco abuse 01/29/2014  . Depression 01/29/2014    Past Medical History:  Past Medical History  Diagnosis Date  . Hypertension   . Back pain   . Renal disorder     CKD stage 3 noted in 02/2014 H & P  . Anemia 01/2014  . LVH (left ventricular hypertrophy) 02/2014    grade 2 diastolic dysfunction.   . Ankle fracture, left 10/2008    non surgical mgt.   . Thrombocytopenia 06/2014  . Coagulopathy 06/2014   Past Surgical History:  Past Surgical History  Procedure Laterality Date  . Eye surgery Left 1990's    Foreign body  . Inguinal hernia repair Right 02/08/2014    Procedure: HERNIA REPAIR INGUINAL INCARCERATED;  Surgeon: Edward Jolly, MD;  Location: [place];  Service: General;  Laterality: Right;  . Insertion of mesh Right 02/08/2014    Procedure: INSERTION OF MESH;  Surgeon: Edward Jolly, MD;  Location: [place];  Service: General;  Laterality: Right;  . Hernia repair    .  Esophagogastroduodenoscopy N/A 06/24/2014    Procedure: ESOPHAGOGASTRODUODENOSCOPY (EGD);  Surgeon: Inda Castle, MD;  Location: [place];  Service: Endoscopy;  Laterality: N/A;  . Colonoscopy N/A 06/25/2014    Procedure: COLONOSCOPY;  Surgeon: Inda Castle, MD;  Location: [place];  Service: Endoscopy;  Laterality: N/A;  . Hemorrhoid surgery N/A 06/25/2014    Procedure: oversew of rectal varices;  Surgeon: Harl Bowie, MD;  Location: [place];  Service: General;  Laterality: N/A;  . Radiology with anesthesia N/A 07/02/2014    Procedure: RADIOLOGY WITH ANESTHESIA;  Surgeon: Jacqulynn Cadet, MD;  Location: [place];  Service: Radiology;  Laterality: N/A;    Assessment & Plan Clinical Impression: Edwin Love is a 53 y.o. right-handed male history of hypertension, grade 2 diastolic dysfunction, chronic renal insufficiency with baseline creatinine 3.43, blindness left eye, cirrhosis and esophageal varices. Patient independent with a cane living with his parents prior to admission. Admitted 06/24/2014 after being found lying in a pool of blood by his family.  CT abdomen and pelvis showed partial thrombosis of the portal vein, multiple new hepatic lesions noted as well as extensive small bowel wall edema. Hemoglobin upon admission 6.2 and was transfused. Underwent EGD 06/24/2014 negative for UGIB. Bleeding scan/colonoscopy positive for LGIB in the sigmoid colon per workup of gastroenterology Dr. [name].. Patient with persistent rectal bleeding underwent oversew of rectal varices per Dr. 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 Hospital course dysphagia currently on  mechanical soft nectar thick liquids. Patient transferred to CIR on 07/05/2014 .   Patient currently requires min with mobility secondary to generalized muscle weakness and decreased cardiorespiratoy endurance.  Prior to  hospitalization, patient was modified independent  with mobility and lived with Family (parents, brother and cousin) in a House home.  Home access is enters home from basement where he livesLevel entry (Basement).  Patient will benefit from skilled PT intervention to maximize safe functional mobility, minimize fall risk and decrease caregiver burden for planned discharge home with intermittent assist.  Anticipate patient will not need PT follow up at discharge.  PT - End of Session Activity Tolerance: Decreased this session;Tolerates 30+ min activity with multiple rests Endurance Deficit: Yes Endurance Deficit Description: requires frequent rest, reports he recovers within about a minute PT Assessment Rehab Potential: Good PT Patient demonstrates impairments in the following area(s): Balance;Endurance;Motor;Pain;Safety PT Transfers Functional Problem(s): Bed Mobility;Bed to Chair;Car;Furniture;Floor PT Locomotion Functional Problem(s): Ambulation;Wheelchair Mobility;Stairs PT Plan PT Intensity: Minimum of 1-2 x/day ,45 to 90 minutes PT Frequency: 5 out of 7 days PT Duration Estimated Length of Stay: 7 days PT Treatment/Interventions: Ambulation/gait training;Balance/vestibular training;DME/adaptive equipment instruction;Functional mobility training;Neuromuscular re-education;Pain management;Patient/family education;Stair training;Therapeutic Activities;Therapeutic Exercise;UE/LE Strength taining/ROM;UE/LE Coordination activities PT Transfers Anticipated Outcome(s): mod I PT Locomotion Anticipated Outcome(s): mod I PT Recommendation Follow Up Recommendations: None Patient destination: Home Equipment Recommended: None recommended by PT Equipment Details: pt has [facility]  Skilled Therapeutic Intervention Skilled therapeutic intervention initiated after completion of evaluation. Discussed with patient falls risk, safety within room, and focus of therapy during stay. Discussed possible LOS, goals,  and f/u therapy.  PT Evaluation Precautions/Restrictions Precautions Precautions: Fall Restrictions Weight Bearing Restrictions: No General Chart Reviewed: Yes Family/Caregiver Present: No Vital SignsTherapy Vitals Pulse Rate: 60 BP: 109/70 mmHg Patient Position (if appropriate): Sitting (after ambulation) Pain Pain Assessment Pain Assessment: No/denies pain Home Living/Prior Functioning Home Living Available Help at Discharge: Available 24 hours/day (cousin) Type of Home: House Home Access: Level entry (Basement) Entrance Stairs-Number of Steps: enters home from basement where he lives Home Layout:  Economist and shower in basement) Alternate Level Stairs-Number of Steps: kitchen is up flight of stairs (14) Alternate Level Stairs-Rails: Left (going up the stairs) Additional Comments: "I don't know why I got myself into this (rehab)"  Lives With: Family (parents, brother and cousin) Prior Function Level of Independence: Requires assistive device for independence (occasionally uses a cane)  Able to Take Stairs?: Yes Driving: No 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  Does not appear to fully scan the environment  Cognition Orientation Level: Oriented X4 Sensation Sensation Light Touch: Appears Intact Hot/Cold: Appears Intact Proprioception: Appears Intact Coordination Gross Motor Movements are Fluid and Coordinated: Yes Fine Motor Movements are Fluid and Coordinated: Yes 9 Hole Peg Test: [facility] Motor  Motor Motor: Within Functional Limits  Mobility Bed Mobility Bed Mobility: Supine to Sit;Sit to Supine Supine to Sit: 5: Supervision Sit to Supine: 5: Supervision Transfers Transfers: Yes Sit to Stand: 4: Min guard;With armrests;From bed;From chair/3-in-1 Stand to Sit: 4: Min  guard;With armrests;To bed;To chair/3-in-1 Locomotion  Ambulation Ambulation: Yes Ambulation/Gait Assistance: 4: Min assist Ambulation Distance (Feet): 150 Feet Assistive device: None;1 person hand held assist;Straight cane Gait Gait: Yes Gait Pattern: Impaired Gait Pattern: Step-through pattern;Wide base of support;Lateral trunk lean to left;Lateral trunk lean to right Gait velocity: increased, slightly  impulsive Stairs / Additional Locomotion Stairs: Yes Stairs Assistance: 4: Min assist Stairs Assistance Details: Verbal cues for safe use of DME/AE Stair Management Technique: Two rails;Forwards (reciprocal pattern ascending, step-to pattern descending) Number of Stairs: 5 Height of Stairs: 6 Wheelchair Mobility Wheelchair Mobility: No (pt ambulatory)  Trunk/Postural Assessment  Cervical Assessment Cervical Assessment: Exceptions to [facility] (forward head) Thoracic Assessment Thoracic Assessment: Exceptions to [facility] (kyphotic) Lumbar Assessment Lumbar Assessment: Within Functional Limits Postural Control Postural Control: Deficits on evaluation Protective Responses: delayed  Balance Balance Balance Assessed: Yes Standardized Balance Assessment Standardized Balance Assessment: Berg Balance Test Berg Balance Test Sit to Stand: Able to stand  independently using hands Standing Unsupported: Able to stand safely 2 minutes Sitting with Back Unsupported but Feet Supported on Floor or Stool: Able to sit safely and securely 2 minutes Stand to Sit: Controls descent by using hands Transfers: Able to transfer safely, definite need of hands Standing Unsupported with Eyes Closed: Able to stand 10 seconds safely Standing Ubsupported with Feet Together: Able to place feet together independently and stand 1 minute safely From Standing, Reach Forward with Outstretched Arm: Can reach forward >12 cm safely (5") From Standing Position, Pick up Object from Floor: Able to pick up shoe, needs  supervision From Standing Position, Turn to Look Behind Over each Shoulder: Looks behind one side only/other side shows less weight shift Turn 360 Degrees: Able to turn 360 degrees safely one side only in 4 seconds or less Standing Unsupported, Alternately Place Feet on Step/Stool: Able to stand independently and complete 8 steps >20 seconds Standing Unsupported, One Foot in Front: Needs help to step but can hold 15 seconds Standing on One Leg: Able to lift leg independently and hold equal to or more than 3 seconds Total Score: 43/56 Extremity Assessment  RUE Assessment RUE Assessment: Within Functional Limits (triceps overall 4/5) LUE Assessment LUE Assessment: Within Functional Limits (triceps overall 4/5) RLE Assessment RLE Assessment: Within Functional Limits LLE Assessment LLE Assessment: Exceptions to [facility] LLE Strength LLE Overall Strength: Deficits LLE Overall Strength Comments: grossly 4- to 4+/5 throughout  FIM:  FIM - Control and instrumentation engineer Devices: Arm rests Bed/Chair Transfer: 5: Supine > Sit: Supervision (verbal cues/safety issues);4: Bed > Chair or W/C: Min A (steadying Pt. > 75%);5: Sit > Supine: Supervision (verbal cues/safety issues);4: Chair or W/C > Bed: Min A (steadying Pt. > 75%) FIM - Locomotion: Wheelchair Locomotion: Wheelchair: 0: Activity did not occur (pt ambulatory) FIM - Locomotion: Ambulation Locomotion: Ambulation Assistive Devices: Occupational psychologist (comment) (L HHA) Ambulation/Gait Assistance: 4: Min assist Locomotion: Ambulation: 4: Travels 150 ft or more with minimal assistance (Pt.>75%) FIM - Locomotion: Stairs Locomotion: Scientist, physiological: Hand rail - 2 Locomotion: Stairs: 2: Up and Down 4 - 11 stairs with minimal assistance (Pt.>75%)   Refer to Care Plan for Long Term Goals  Recommendations for other services: None  Discharge Criteria: Patient will be discharged from PT if patient refuses treatment 3  consecutive times without medical reason, if treatment goals not met, if there is a change in medical status, if patient makes no progress towards goals or if patient is discharged from hospital.  The above assessment, treatment plan, treatment alternatives and goals were discussed and mutually agreed upon: by patient  Laretta Alstrom 07/06/2014, 12:10 PM

## 2014-07-06 NOTE — Care Management Note (Signed)
[facility] Individual Statement of Services  Patient Name:  Edwin Love  Date:  07/06/2014  Welcome to the [facility].  Our goal is to provide you with an individualized program based on your diagnosis and situation, designed to meet your specific needs.  With this comprehensive rehabilitation program, you will be expected to participate in at least 3 hours of rehabilitation therapies Monday-Friday, with modified therapy programming on the weekends.  Your rehabilitation program will include the following services:  Physical Therapy (PT), Occupational Therapy (OT), Speech Therapy (ST), 24 hour per day rehabilitation nursing, Case Management (Social Worker), Rehabilitation Medicine, Nutrition Services and Pharmacy Services  Weekly team conferences will be held on Wednesday to discuss your progress.  Your Social Worker will talk with you frequently to get your input and to update you on team discussions.  Team conferences with you and your family in attendance may also be held.  Expected length of stay: 7 days  Overall anticipated outcome: mod/i level  Depending on your progress and recovery, your program may change. Your Social Worker will coordinate services and will keep you informed of any changes. Your Social Worker's name and contact numbers are listed  below.  The following services may also be recommended but are not provided by the [facility]:    [place] will be made to provide these services after discharge if needed.  Arrangements include referral to agencies that provide these services.  Your insurance has been verified to be:  Medicaid Your primary doctor is:    Pertinent information will be shared with your doctor and your insurance company.  Social Worker:  Ovidio Kin, [place] or ([phone]  Information  discussed with and copy given to patient by: Elease Hashimoto, 07/06/2014, 2:31 PM

## 2014-07-06 NOTE — Progress Notes (Signed)
53 y.o. right-handed male history of hypertension, grade 2 diastolic dysfunction, chronic renal insufficiency with baseline creatinine 3.43, blindness left eye, cirrhosis and esophageal varices. Patient independent with a cane living with his parents prior to admission. Admitted 06/24/2014 after being found lying in a pool of blood by his family.   CT abdomen and pelvis showed partial thrombosis of the portal vein, multiple new hepatic lesions noted as well as extensive small bowel wall edema. Hemoglobin upon admission 6.2 and was transfused. Underwent EGD 06/24/2014 negative for UGIB. Bleeding scan/colonoscopy positive for LGIB in the sigmoid colon per workup of gastroenterology Dr. [name].. Patient with persistent rectal bleeding underwent oversew of rectal varices per Dr. Coralie Keens 06/25/2014 as well as TIPS with obliteration/embolization of the hemorrhoidal veins per interventional radiology 07/02/2014  Subjective/Complaints: Fair appetite  No abd pain Last drink around 7/14  Objective: Vital Signs: Blood pressure 129/72, pulse 73, temperature 99.1 F (37.3 C), temperature source Oral, resp. rate 20, height $RemoveBe'5\' 9"'wJNUFEzSg$  (1.753 m), weight 93.3 kg (205 lb 11 oz), SpO2 100.00%. No results found. Results for orders placed during the hospital encounter of 07/05/14 (from the past 72 hour(s))  GLUCOSE, CAPILLARY     Status: None   Collection Time    07/05/14  5:02 PM      Result Value Ref Range   Glucose-Capillary 89  70 - 99 mg/dL   Comment 1 Notify RN    GLUCOSE, CAPILLARY     Status: Abnormal   Collection Time    07/05/14  8:56 PM      Result Value Ref Range   Glucose-Capillary 106 (*) 70 - 99 mg/dL   Comment 1 Notify RN    CBC WITH DIFFERENTIAL     Status: Abnormal (Preliminary result)   Collection Time    07/06/14  6:30 AM      Result Value Ref Range   WBC 12.8 (*) 4.0 - 10.5 K/uL   RBC 2.72 (*) 4.22 - 5.81 MIL/uL   Hemoglobin 8.2 (*) 13.0 - 17.0 g/dL   HCT 24.5 (*) 39.0 - 52.0 %    MCV 90.1  78.0 - 100.0 fL   MCH 30.1  26.0 - 34.0 pg   MCHC 33.5  30.0 - 36.0 g/dL   RDW 15.5  11.5 - 15.5 %   Platelets 123 (*) 150 - 400 K/uL   Neutrophils Relative % PENDING  43 - 77 %   Neutro Abs PENDING  1.7 - 7.7 K/uL   Band Neutrophils PENDING  0 - 10 %   Lymphocytes Relative PENDING  12 - 46 %   Lymphs Abs PENDING  0.7 - 4.0 K/uL   Monocytes Relative PENDING  3 - 12 %   Monocytes Absolute PENDING  0.1 - 1.0 K/uL   Eosinophils Relative PENDING  0 - 5 %   Eosinophils Absolute PENDING  0.0 - 0.7 K/uL   Basophils Relative PENDING  0 - 1 %   Basophils Absolute PENDING  0.0 - 0.1 K/uL   WBC Morphology PENDING     RBC Morphology PENDING     Smear Review PENDING     nRBC PENDING  0 /100 WBC   Metamyelocytes Relative PENDING     Myelocytes PENDING     Promyelocytes Absolute PENDING     Blasts PENDING    COMPREHENSIVE METABOLIC PANEL     Status: Abnormal   Collection Time    07/06/14  6:30 AM      Result Value Ref Range  Sodium 140  137 - 147 mEq/L   Potassium 3.9  3.7 - 5.3 mEq/L   Chloride 106  96 - 112 mEq/L   CO2 22  19 - 32 mEq/L   Glucose, Bld 89  70 - 99 mg/dL   BUN 20  6 - 23 mg/dL   Creatinine, Ser 1.40 (*) 0.50 - 1.35 mg/dL   Calcium 7.2 (*) 8.4 - 10.5 mg/dL   Total Protein 5.3 (*) 6.0 - 8.3 g/dL   Albumin 1.7 (*) 3.5 - 5.2 g/dL   AST 41 (*) 0 - 37 U/L   ALT 25  0 - 53 U/L   Alkaline Phosphatase 116  39 - 117 U/L   Total Bilirubin 2.2 (*) 0.3 - 1.2 mg/dL   GFR calc non Af Amer 56 (*) >90 mL/min   GFR calc Af Amer 65 (*) >90 mL/min   Comment: (NOTE)     The eGFR has been calculated using the CKD EPI equation.     This calculation has not been validated in all clinical situations.     eGFR's persistently <90 mL/min signify possible Chronic Kidney     Disease.   Anion gap 12  5 - 15       HENT:  Poor dentition. Oral mucosa moist Eyes: Conjunctivae and EOM are normal on right. LEFT eye enucleated Neck: Normal range of motion. Neck supple. No tracheal  deviation present. No thyromegaly present.   Cardiovascular: Normal rate and regular rhythm.   Respiratory: Effort normal and breath sounds normal. No respiratory distress. Scattered wheezes GI: Bowel sounds are normal. He exhibits distension. Abdomen non-tender Neurological:  Patient is alert with flat affect. Seems to have only basic insight and awareness. He was appropriate for name, age date of birth. He did follow simple commands.  Moved all 4 limbs. Grossly 4- prox to 4/5 distally in UE's and 4-/5 prox HF to 4/5 with KE and ADF/APF. Sensed pain and light touch in all 4's.  Skin: Skin is warm and dry.  Psychiatric:  Flat, cooperative   Assessment/Plan: 1. Functional deficits secondary to deconditioning secondary to severe GI bleed which require 3+ hours per day of interdisciplinary therapy in a comprehensive inpatient rehab setting. Physiatrist is providing close team supervision and 24 hour management of active medical problems listed below. Physiatrist and rehab team continue to assess barriers to discharge/monitor patient progress toward functional and medical goals. FIM:             FIM - Control and instrumentation engineer Devices: Copy: 4: Chair or W/C > Bed: Min A (steadying Pt. > 75%)     Comprehension Comprehension Mode: Auditory Comprehension: 4-Understands basic 75 - 89% of the time/requires cueing 10 - 24% of the time  Expression Expression Mode: Verbal Expression: 4-Expresses basic 75 - 89% of the time/requires cueing 10 - 24% of the time. Needs helper to occlude trach/needs to repeat words.  Social Interaction Social Interaction: 3-Interacts appropriately 50 - 74% of the time - May be physically or verbally inappropriate.  Problem Solving Problem Solving: 4-Solves basic 75 - 89% of the time/requires cueing 10 - 24% of the time  Memory Memory: 3-Recognizes or recalls 50 - 74% of the time/requires cueing 25 - 49% of the  time   Medical Problem List and Plan:   1. Functional deficits secondary to deconditioning due to GI bleed/multi-medical   2. DVT Prophylaxis/Anticoagulation: SCDs. Monitor for signs of DVT   3. Pain Management: Tylenol as  needed   4. Mood/depression: Resume home regimen of Celexa 20 mg daily. Provide emotional support   5. Neuropsych: This patient is not capable of making decisions on his own behalf.   6. Anemia secondary to GI bleed. Status post transfusion/TIPS procedure with embolization. Latest hemoglobin 8.1. Followup CBC.   7. Dysphagia.   8. Hypertension. Coreg 6.25 mg twice a day. Monitor with increased mobility   9. Chronic renal insufficiency. Admission creatinine 3.43. Followup chemistries   10. Blindness left eye.   11. Grade 2 diastolic congestive heart failure. Monitor for any signs of fluid overload   12. Diabetes mellitus with peripheral neuropathy. Diet controlled. Check blood sugars a.c. and at bedtime. Continue sliding scale   LOS (Days) 1 A FACE TO FACE EVALUATION WAS PERFORMED  KIRSTEINS,ANDREW E 07/06/2014, 7:29 AM

## 2014-07-06 NOTE — Evaluation (Signed)
Speech Language Pathology Assessment and Plan  Patient Details  Name: Edwin Love MRN: [id number] Date of Birth: March 17, 1961  SLP Diagnosis: Cognitive Impairments  Rehab Potential: Good  Today's Date: 07/06/2014 Time: 1305-1405 Time Calculation (min): 60 min  Problem List:  Patient Active Problem List   Diagnosis Date Noted  . Physical deconditioning 07/05/2014  . Hemorrhagic shock 06/26/2014  . Acute respiratory failure 06/26/2014  . Acute kidney injury 06/26/2014  . 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 Hypotension, unspecified 06/25/2014  . Acute posthemorrhagic anemia 06/24/2014  . Blood in stool 06/24/2014  . Alcoholic cirrhosis 64/40/3474  . Esophageal varices without bleeding 06/24/2014  . GI bleed 06/24/2014  . Lower GI bleed 06/24/2014  . Incarcerated inguinal hernia 02/08/2014  . Chest pain 01/29/2014  . Tachycardia 01/29/2014  . CKD (chronic kidney disease) stage 3, GFR 30-59 ml/min 01/29/2014  . Tobacco abuse 01/29/2014  . Depression 01/29/2014   Past Medical History:  Past Medical History  Diagnosis Date  . Hypertension   . Back pain   . Renal disorder     CKD stage 3 noted in 02/2014 [facility]  . Anemia 01/2014  . LVH (left ventricular hypertrophy) 02/2014    grade 2 diastolic dysfunction.   . Ankle fracture, left 10/2008    non surgical mgt.   . Thrombocytopenia 06/2014  . Coagulopathy 06/2014   Past Surgical History:  Past Surgical History  Procedure Laterality Date  . Eye surgery Left 1990's    Foreign body  . Inguinal hernia repair Right 02/08/2014    Procedure: HERNIA REPAIR INGUINAL INCARCERATED;  Surgeon: Edward Jolly, MD;  Location: [facility];  Service: General;  Laterality: Right;  . Insertion of mesh Right 02/08/2014    Procedure: INSERTION OF MESH;  Surgeon: Edward Jolly, MD;  Location: [facility];  Service: General;  Laterality: Right;  . Hernia repair    . Esophagogastroduodenoscopy N/A 06/24/2014    Procedure: ESOPHAGOGASTRODUODENOSCOPY  (EGD);  Surgeon: Inda Castle, MD;  Location: [facility];  Service: Endoscopy;  Laterality: N/A;  . Colonoscopy N/A 06/25/2014    Procedure: COLONOSCOPY;  Surgeon: Inda Castle, MD;  Location: [facility];  Service: Endoscopy;  Laterality: N/A;  . Hemorrhoid surgery N/A 06/25/2014    Procedure: oversew of rectal varices;  Surgeon: Harl Bowie, MD;  Location: [facility];  Service: General;  Laterality: N/A;  . Radiology with anesthesia N/A 07/02/2014    Procedure: RADIOLOGY WITH ANESTHESIA;  Surgeon: Jacqulynn Cadet, MD;  Location: [facility];  Service: Radiology;  Laterality: N/A;    Assessment / Plan / Recommendation Clinical Impression  Edwin Love is a 53 y.o. right-handed male history of hypertension, grade 2 diastolic dysfunction, chronic renal insufficiency with baseline creatinine 3.43, blindness left eye, cirrhosis and esophageal varices. Patient independent with a cane living with his parents prior to admission. Admitted 06/24/2014 after being found lying in a pool of blood by his family.  CT abdomen and pelvis showed partial thrombosis of the portal vein, multiple new hepatic lesions noted as well as extensive small bowel wall edema. Hemoglobin upon admission 6.2 and was transfused. Underwent EGD 06/24/2014 negative for UGIB. Bleeding scan/colonoscopy positive for LGIB in the sigmoid colon per workup of gastroenterology Dr. [name].. Patient with persistent rectal bleeding underwent oversew of rectal varices per Dr. 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 Hospital course dysphagia currently on a 2 gram sodium diet with thin liquids.  Physical therapy and occupational evaluations completed 06/30/2014 with recommendations for physical medicine rehabilitation consult. Patient was admitted for comprehensive rehabilitation program on 07/05/14 with SLP evaluation completed 07/07/2014 with  the following results.   Pt presents with grossly intact oropharyngeal swallow function with no overt s/s of aspiration noted across any consistencies assessed including thin liquids via cup and straw sips.  Furthermore, pt was noted with adequate mastication of solid consistencies resulting in complete clearance of the oral cavity.  Recommend that pt continue on a 2 gram sodium diet per MD with regular textures and thin liquids.  Additionally, pt presents with mild-moderate cognitive impairments for safety awareness, decreased recall of new information, and semi-complex problem solving; however, pt and his family both report that he has returned to his baseline level of cognitive function and pt's current impairments are likely related to a prolonged history of substance abuse.  Pt was independent for medication management prior to admission, therefore, SLP provided extensive education related to recommendations for pt's family to provide assistance for medication management upon discharge.  SLP also provided skilled education related to compensatory strategies for memory, organization, and safety awareness with pt and pt's family verbalizing understanding.  Safety precautions were reviewed and reinforced extensively as pt's RN reports that pt frequently attempts to get up unassisted despite repeated education.  Education is complete at this time and, as a result, no further ST needs are indicated.      Skilled Therapeutic Interventions          Cognitive-linguistic evaluation completed with results and recommendations reviewed with family.    SLP Assessment  Patient does not need any further Speech Lanaguage Pathology Services    Recommendations  Diet Recommendations: Regular ;Thin liquid Liquid Administration via: Cup;Straw Medication Administration: Whole meds with liquid Supervision: Patient able to self feed Compensations: Slow rate;Small sips/bites Postural Changes and/or Swallow Maneuvers:  Seated upright 90 degrees Oral Care Recommendations: Oral care BID Recommendations for Other Services: Neuropsych consult Patient destination: Home Follow up Recommendations: None Equipment Recommended: None recommended by SLP           Pain Pain Assessment Pain Assessment: No/denies pain Prior Functioning Cognitive/Linguistic Baseline: Baseline deficits Baseline deficit details: history of significant substance abuse, living at home in parent's basement Type of Home: House  Lives With: Family Available Help at Discharge: Available 24 hours/day Education: 11th grade Vocation: Unemployed  See FIM for current functional status Refer to Care Plan for Long Term Goals  Recommendations for other services: Neuropsych  The above assessment, treatment plan, treatment alternatives and goals were discussed and mutually agreed upon: by patient and by family  Rodneshia Greenhouse, Selinda Orion 07/06/2014, 2:28 PM

## 2014-07-06 NOTE — Evaluation (Signed)
Occupational Therapy Assessment and Plan  Patient Details  Name: Edwin Love MRN: [id number] Date of Birth: 1961/05/08  OT Diagnosis: abnormal posture, muscle weakness (generalized), impaired balance and decreased functional endurance. Rehab Potential: Rehab Potential: Good ELOS: 7 days   Today's Date: 07/06/2014 Time: 0800-0900 Time Calculation (min): 60 min  Problem List:  Patient Active Problem List   Diagnosis Date Noted  . Physical deconditioning 07/05/2014  . Hemorrhagic shock 06/26/2014  . Acute respiratory failure 06/26/2014  . Acute kidney injury 06/26/2014  . 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 Hypotension, unspecified 06/25/2014  . Acute posthemorrhagic anemia 06/24/2014  . Blood in stool 06/24/2014  . Alcoholic cirrhosis 86/57/8469  . Esophageal varices without bleeding 06/24/2014  . GI bleed 06/24/2014  . Lower GI bleed 06/24/2014  . Incarcerated inguinal hernia 02/08/2014  . Chest pain 01/29/2014  . Tachycardia 01/29/2014  . CKD (chronic kidney disease) stage 3, GFR 30-59 ml/min 01/29/2014  . Tobacco abuse 01/29/2014  . Depression 01/29/2014    Past Medical History:  Past Medical History  Diagnosis Date  . Hypertension   . Back pain   . Renal disorder     CKD stage 3 noted in 02/2014 [facility] & [facility]  . Anemia 01/2014  . LVH (left ventricular hypertrophy) 02/2014    grade 2 diastolic dysfunction.   . Ankle fracture, left 10/2008    non surgical mgt.   . Thrombocytopenia 06/2014  . Coagulopathy 06/2014   Past Surgical History:  Past Surgical History  Procedure Laterality Date  . Eye surgery Left 1990's    Foreign body  . Inguinal hernia repair Right 02/08/2014    Procedure: HERNIA REPAIR INGUINAL INCARCERATED;  Surgeon: Edward Jolly, MD;  Location: [facility];  Service: General;  Laterality: Right;  . Insertion of mesh Right 02/08/2014    Procedure: INSERTION OF MESH;  Surgeon: Edward Jolly, MD;  Location: [facility];  Service: General;  Laterality: Right;   . Hernia repair    . Esophagogastroduodenoscopy N/A 06/24/2014    Procedure: ESOPHAGOGASTRODUODENOSCOPY (EGD);  Surgeon: Inda Castle, MD;  Location: [place];  Service: Endoscopy;  Laterality: N/A;  . Colonoscopy N/A 06/25/2014    Procedure: COLONOSCOPY;  Surgeon: Inda Castle, MD;  Location: [place] [facility];  Service: Endoscopy;  Laterality: N/A;  . Hemorrhoid surgery N/A 06/25/2014    Procedure: oversew of rectal varices;  Surgeon: Harl Bowie, MD;  Location: [facility];  Service: General;  Laterality: N/A;  . Radiology with anesthesia N/A 07/02/2014    Procedure: RADIOLOGY WITH ANESTHESIA;  Surgeon: Jacqulynn Cadet, MD;  Location: [place];  Service: Radiology;  Laterality: N/A;    Assessment & Plan Clinical Impression: Edwin Love is a 53 y.o. right-handed male history of hypertension, grade 2 diastolic dysfunction, chronic renal insufficiency with baseline creatinine 3.43, blindness left eye, cirrhosis and esophageal varices. Patient independent with a cane living with his parents prior to admission. Admitted 06/24/2014 after being found lying in a pool of blood by his family.  CT abdomen and pelvis showed partial thrombosis of the portal vein, multiple new hepatic lesions noted as well as extensive small bowel wall edema. Hemoglobin upon admission 6.2 and was transfused. Underwent EGD 06/24/2014 negative for UGIB. Bleeding scan/colonoscopy positive for LGIB in the sigmoid colon per workup of gastroenterology Dr. [name].. Patient with persistent rectal bleeding underwent oversew of rectal varices per Dr. 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 Hospital course  dysphagia currently on mechanical soft nectar thick liquids.  Patient transferred to CIR on 07/05/2014 .    Patient currently requires min-supervision with basic self-care skills and IADL secondary to muscle weakness,  functional endurance, premorbid cognitive deficits, and decreased standing balance and decreased balance strategies. Prior to hospitalization, patient reports that he was Mod I with occasional use of cane for BADL & IADL tasks.  Patient will benefit from skilled intervention to increase independence with basic self-care skills and increase level of independence with iADL prior to discharge home with care partner.  Anticipate patient will require intermittent supervision and no further OT follow recommended at this time.  Patient is inconsistent with responses regarding prior level of function and discharge plans.  SW aware and will follow-up.  OT - End of Session Endurance Deficit: Yes Endurance Deficit Description: requires rest breaks OT Assessment Rehab Potential: Good Barriers to Discharge: Decreased caregiver support;Inaccessible home environment Barriers to Discharge Comments: Unsure of patient's home environment or caregiver support as patient is inconsistent with responses OT Patient demonstrates impairments in the following area(s): Balance;Endurance;Safety;Cognition OT Basic ADL's Functional Problem(s): Grooming;Bathing;Dressing;Toileting OT Advanced ADL's Functional Problem(s): Light Housekeeping;Simple Meal Preparation OT Transfers Functional Problem(s): Toilet;Tub/Shower OT Plan OT Intensity: Minimum of 1-2 x/day, 45 to 90 minutes OT Frequency: 5 out of 7 days OT Duration/Estimated Length of Stay: 7 days OT Treatment/Interventions: Teacher, English as a foreign language;Functional mobility training;Discharge planning;Patient/family education;Self Care/advanced ADL retraining;Therapeutic Activities OT Basic Self-Care Anticipated Outcome(s): Mod I OT Toileting Anticipated Outcome(s): Mod I OT Bathroom Transfers Anticipated Outcome(s): Mod I for toilet transfer, Supervision for shower transfer OT Recommendation Patient destination: Home Follow Up  Recommendations: 24 hour supervision/assistance Equipment Recommended: Tub/shower seat Equipment Details: Shower DME TBA  Skilled Therapeutic Intervention OT Evaluation and self care retraining.  After completion of evaluation, discussed with patient falls risk, safety within room, focus of therapy during stay, possible LOS, and goals.  Engaged in shower and groom tasks with focus on activity tolerance, functional mobility with RW and dynamic balance.  Patient did not have any street clothes on eval and required numerous rest breaks.  Patient did not provide consistent answer/responses to questions regarding prior level of function and home environment setting or assistance that might be available.  SW to follow up.  Patient reports that he wants to discharge by Friday if possible.  OT Evaluation Precautions/Restrictions  Precautions Precautions: Fall Restrictions Weight Bearing Restrictions: No Pain Denies pain Home Living/Prior Functioning Home Living Available Help at Discharge: Available 24 hours/day (cousin) Type of Home: House Home Access: Level entry (Basement) Entrance Stairs-Number of Steps: enters home from basement where he lives Home Layout:  (Bath and shower in basement) Alternate Level Stairs-Number of Steps: kitchen is up flight of stairs (14) Alternate Level Stairs-Rails: Left (going up the stairs) Additional Comments: "trying to get disability", "I don't know why I got myself into this (rehab)"  Lives With: Family (parents, brother and cousin) Prior Function Level of Independence: Requires assistive device for independence (occasionally uses a cane)  Able to Take Stairs?: Yes Driving: No 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 Likes or dislikes ADL Refer to FIM below for details Vision/Perception  Vision- History Baseline  Vision/History:  (Blind in left eye) Patient Visual Report: No change from baseline Vision- Assessment Additional Comments: reports that he needs glasses to see far away.  Does not appear to fully scan the environment  Cognition  Overall Cognitive Status: History of cognitive impairments - at baseline Orientation Level: Oriented X4 Attention: Sustained Sustained Attention: Impaired Sustained Attention Impairment: Functional basic;Verbal basic Memory: Impaired Memory Impairment: Decreased recall of new information;Decreased short term memory Decreased Short Term Memory: Verbal complex Awareness: Impaired Awareness Impairment: Emergent impairment Problem Solving: Impaired Problem Solving Impairment: Functional complex Behaviors: Impulsive;Restless;Poor frustration tolerance Safety/Judgment: Impaired Comments: per RN report, poor compliance with safety precautions and attempts to get up out of bed unassisted Sensation Sensation Light Touch: Appears Intact Hot/Cold: Appears Intact Proprioception: Appears Intact Coordination Gross Motor Movements are Fluid and Coordinated: Yes Fine Motor Movements are Fluid and Coordinated: Yes 9 Hole Peg Test: [facility] Motor  Motor Motor: Within Functional Limits Mobility  Bed Mobility Bed Mobility: Supine to Sit;Sit to Supine Supine to Sit: 5: Supervision Sit to Supine: 5: Supervision Transfers Sit to Stand: 4: Min guard;With armrests;From bed;From chair/3-in-1 Stand to Sit: 4: Min guard;With armrests;To bed;To chair/3-in-1  Trunk/Postural Assessment  Cervical Assessment Cervical Assessment: Exceptions to [facility] (forward head) Thoracic Assessment Thoracic Assessment: Exceptions to [facility] (kyphotic) Lumbar Assessment Lumbar Assessment: Within Functional Limits Postural Control Postural Control: Deficits on evaluation Protective Responses: delayed  Balance Balance Assessed: Yes Standardized Balance Assessment: Berg Balance Test performed by  PT Berg Balance Test Sit to Stand: Able to stand  independently using hands Standing Unsupported: Able to stand safely 2 minutes Sitting with Back Unsupported but Feet Supported on Floor or Stool: Able to sit safely and securely 2 minutes Stand to Sit: Controls descent by using hands Transfers: Able to transfer safely, definite need of hands Standing Unsupported with Eyes Closed: Able to stand 10 seconds safely Standing Ubsupported with Feet Together: Able to place feet together independently and stand 1 minute safely From Standing, Reach Forward with Outstretched Arm: Can reach forward >12 cm safely (5") From Standing Position, Pick up Object from Floor: Able to pick up shoe, needs supervision From Standing Position, Turn to Look Behind Over each Shoulder: Looks behind one side only/other side shows less weight shift Turn 360 Degrees: Able to turn 360 degrees safely one side only in 4 seconds or less Standing Unsupported, Alternately Place Feet on Step/Stool: Able to stand independently and complete 8 steps >20 seconds Standing Unsupported, One Foot in Front: Needs help to step but can hold 15 seconds Standing on One Leg: Able to lift leg independently and hold equal to or more than 3 seconds Total Score: 43  Extremity/Trunk Assessment RUE Assessment RUE Assessment: Within Functional Limits (triceps overall 4/5) LUE Assessment LUE Assessment: Within Functional Limits (triceps overall 4/5)  FIM:  FIM - Eating Eating Activity: 6: More than reasonable amount of time FIM - Grooming Grooming Steps: Wash, rinse, dry face;Wash, rinse, dry hands;Oral care, brush teeth, clean dentures Grooming: 4: Patient completes 3 of 4 or 4 of 5 steps FIM - Bathing Bathing Steps Patient Completed: Chest;Right Arm;Left Arm;Abdomen;Front perineal area;Buttocks;Right upper leg;Left upper leg;Right lower leg (including foot);Left lower leg (including foot) Bathing: 5: Supervision: Safety issues/verbal  cues FIM - Upper Body Dressing/Undressing Upper body dressing/undressing: 0: Wears gown/pajamas-no public clothing FIM - Lower Body Dressing/Undressing Lower body dressing/undressing steps patient completed: Don/Doff right sock;Don/Doff left sock;Don/Doff right shoe Lower body dressing/undressing: 0: Wears Interior and spatial designer FIM - Control and instrumentation engineer Devices: Arm rests Bed/Chair Transfer: 5: Supine > Sit: Supervision (verbal cues/safety issues);4: Bed > Chair or W/C: Min A (steadying Pt. > 75%);5: Sit > Supine: Supervision (verbal cues/safety issues);4: Chair or W/C > Bed: Min A (  steadying Pt. > 75%) FIM - Tub/Shower Transfers Tub/Shower Assistive Devices: Walk in shower;Grab bars;Tub transfer bench;Walker Tub/shower Transfers: 4-Into Tub/Shower: Min A (steadying Pt. > 75%/lift 1 leg);4-Out of Tub/Shower: Min A (steadying Pt. > 75%/lift 1 leg)   Refer to Care Plan for Long Term Goals  Recommendations for other services: None  Discharge Criteria: Patient will be discharged from OT if patient refuses treatment 3 consecutive times without medical reason, if treatment goals not met, if there is a change in medical status, if patient makes no progress towards goals or if patient is discharged from hospital.  The above assessment, treatment plan, treatment alternatives and goals were discussed and mutually agreed upon: by patient  Tionne Dayhoff 07/06/2014, 12:25 PM

## 2014-07-07 ENCOUNTER — Inpatient Hospital Stay (HOSPITAL_COMMUNITY): Payer: Medicaid Other | Admitting: *Deleted

## 2014-07-07 ENCOUNTER — Inpatient Hospital Stay (HOSPITAL_COMMUNITY): Payer: Medicaid Other | Admitting: Occupational Therapy

## 2014-07-07 ENCOUNTER — Inpatient Hospital Stay (HOSPITAL_COMMUNITY): Payer: Medicaid Other | Admitting: Physical Therapy

## 2014-07-07 LAB — CBC
HCT: 25.1 % — ABNORMAL LOW (ref 39.0–52.0)
Hemoglobin: 8.2 g/dL — ABNORMAL LOW (ref 13.0–17.0)
MCH: 29.8 pg (ref 26.0–34.0)
MCHC: 32.7 g/dL (ref 30.0–36.0)
MCV: 91.3 fL (ref 78.0–100.0)
Platelets: 158 10*3/uL (ref 150–400)
RBC: 2.75 MIL/uL — ABNORMAL LOW (ref 4.22–5.81)
RDW: 15.6 % — AB (ref 11.5–15.5)
WBC: 10.7 10*3/uL — AB (ref 4.0–10.5)

## 2014-07-07 LAB — GLUCOSE, CAPILLARY
Glucose-Capillary: 119 mg/dL — ABNORMAL HIGH (ref 70–99)
Glucose-Capillary: 89 mg/dL (ref 70–99)
Glucose-Capillary: 125 mg/dL — ABNORMAL HIGH (ref 70–99)
Glucose-Capillary: 114 mg/dL — ABNORMAL HIGH (ref 70–99)

## 2014-07-07 NOTE — Progress Notes (Signed)
I discussed with  Dr Lorenso Courier.  I agree with their plans documented in their progress note.

## 2014-07-07 NOTE — Progress Notes (Signed)
Physical Therapy Session Note  Patient Details  Name: Edwin Love MRN: [id number] Date of Birth: [birth year]  Today's Date: 07/07/2014 Time: 8315-1761 Time Calculation (min): 45 min  Short Term Goals: Week 1:  PT Short Term Goal 1 (Week 1): = LTGs due to anticipated LOS  Skilled Therapeutic Interventions/Progress Updates:   Pt received seated in recliner. Agreed to participate in PT session with mod coaxing. Pt performed gait x175' in controlled environment with Mod I using SPC. Negotiated 12 stairs with L rail to ascend, R rail to descend; verbal cueing for pt to perform with step-to pattern for safety. Returned to room, where pt was instructed in HEP; see below for detailed description of LE therapeutic exercises.  Provided pt with paper handout to increase pt carryover with HEP. Pt made modified independent in room using SPC. Modified safety plan and notified nurse tech. Therapist departed with pt seated in recliner with all needs within reach.   Therapy Documentation Precautions:  Precautions Precautions: Fall Restrictions Weight Bearing Restrictions: No Vital Signs: Therapy Vitals Temp: 98.3 F (36.8 C) Temp src: Oral Pulse Rate: 73 Resp: 18 BP: 147/81 mmHg Patient Position (if appropriate): Lying Oxygen Therapy SpO2: 100 % Pain: Pain Assessment Pain Assessment: No/denies pain Locomotion : Ambulation Ambulation/Gait Assistance: 6: Modified independent (Device/Increase time)  Exercises:  Standing with bilat UE support, this therapist guided pt through the following therapeutic exercises for hip strengthening: 1) bilat hip abduction 2x10 reps on R, 1x10 reps and 1x8 reps (to pt fatigue) on L. Cueing focused on isolation of gluteus medius. 2) bilat hip extension 2x10 reps per side .Cueing focused on avoidance of compensatory movements (contralateral trunk lean to achieve LE movement). Standing with single UE support, pt performed deep squats x12 reps (to pt fatigue)  with cueing focused on avoiding quadriceps dominance.  See FIM for current functional status  Therapy/Group: Individual Therapy  Hobble, Malva Cogan 07/07/2014, 4:35 PM

## 2014-07-07 NOTE — Progress Notes (Addendum)
Physical Therapy Session Note  Patient Details  Name: Edwin Love MRN: [id number] Date of Birth: January 22, 1961  Today's Date: 07/07/2014 Time: 0850-0950 Time Calculation (min): 60 min  Short Term Goals: Week 1:  PT Short Term Goal 1 (Week 1): = LTGs due to anticipated LOS  Skilled Therapeutic Interventions/Progress Updates:   Pt received in recliner; pt denies pain but indicating he would like to go home "today!".  Discussed with pt the need to practice and reach goals for safe D/C home.  Pt agreeable to therapy.  Pt donned pants and shirt in standing and shoes in sitting Mod I.  Performed gait with SPC in RUE >300' in controlled environment with supervision and intermittent min A secondary to lateral LOB.  Performed car transfer training with simulated car with pt performing transfer mod I without cues needed for safety or sequencing.  Performed stair training for home (pt reports usually he walks outside and around the house to enter main level secondary to previous knee injury and weakness but will use stairs when rainy or slick outside).  Pt initially performed up/down 12 stairs with L rail and SPC with min A alternating sequence secondary to R foot catching on step.  Educated pt on use of step to sequence; pt return demonstrated safe step to sequence to descend on second repetition but selected alternating to ascend; still with min A.  Pt fatigued after stairs and required prolonged rest break.  Performed higher level gait training with focus on home and community obstacles with stepping over low obstacles, across compliant surface, weaving L and R around cones, sudden stops and pivots and reaching down to floor to pick up low and tall objects all with min A but pt able to maintain efficient gait velocity.  Reviewed safety for home and falls risk.  Pt performed floor > furniture transfer mod I with pt verbalizing indications for calling EMS and sequence for floor > furniture transfer.   Following rest break pt returned to room with supervision and set up in recliner with quick release belt in place and all items within reach.     Therapy Documentation Precautions:  Precautions Precautions: Fall Restrictions Weight Bearing Restrictions: No Pain: Pain Assessment Pain Assessment: No/denies pain Locomotion : Ambulation Ambulation/Gait Assistance: 5: Supervision   See FIM for current functional status  Therapy/Group: Individual Therapy  Raylene Everts [facility] 07/07/2014, 10:24 AM

## 2014-07-07 NOTE — Progress Notes (Signed)
Physical Therapy Discharge Summary  Patient Details  Name: Edwin Love MRN: [id number] Date of Birth: 04/02/1961  Today's Date: 07/07/2014  Patient has met 11 of 11 long term goals due to improved activity tolerance, improved balance and increased strength.  Patient to discharge at an ambulatory level Modified Independent.   Patient's care partner not needed to provide the necessary assistance at discharge secondary to pt mod I and close to baseline function.  Reasons goals not met: All goals met  Recommendation:  Patient unable to receive follow up therapy secondary to insurance but will provide pt with HEP to perform at home.  Equipment: No equipment provided  Reasons for discharge: treatment goals met and discharge from hospital  Patient/family agrees with progress made and goals achieved: Yes  PT Discharge Precautions/Restrictions   Vital Signs Therapy Vitals Temp: 98.3 F (36.8 C) Temp src: Oral Pulse Rate: 73 Resp: 18 BP: 147/81 mmHg Patient Position (if appropriate): Lying Oxygen Therapy SpO2: 100 % Pain Pain Assessment Pain Assessment: No/denies pain Pain Score: 0-No pain Sensation Coordination Heel Shin Test: Movement appears coordinated bilaterally. Excursion of RLE movement limited by decreased R hip external rotation. Locomotion  Ambulation Ambulation/Gait Assistance: 6: Modified independent (Device/Increase time)  Balance  Mod I overall Extremity Assessment  RLE Assessment RLE Assessment: Within Functional Limits LLE Assessment LLE Assessment: Exceptions to WFL LLE Strength LLE Overall Strength: Deficits Left Hip Flexion: 4/5 Left Hip Extension: 4/5 Left Hip ABduction: 3+/5 Left Knee Flexion: 5/5 Left Knee Extension: 4/5 Left Ankle Dorsiflexion: 4/5 Left Ankle Plantar Flexion: 5/5  See FIM for current functional status  Hall,  [place] 07/07/2014, 5:05 PM   

## 2014-07-07 NOTE — Progress Notes (Signed)
53 y.o. right-handed male history of hypertension, grade 2 diastolic dysfunction, chronic renal insufficiency with baseline creatinine 3.43, blindness left eye, cirrhosis and esophageal varices. Patient independent with a cane living with his parents prior to admission. Admitted 06/24/2014 after being found lying in a pool of blood by his family.   CT abdomen and pelvis showed partial thrombosis of the portal vein, multiple new hepatic lesions noted as well as extensive small bowel wall edema. Hemoglobin upon admission 6.2 and was transfused. Underwent EGD 06/24/2014 negative for UGIB. Bleeding scan/colonoscopy positive for LGIB in the sigmoid colon per workup of gastroenterology Dr. [name].. Patient with persistent rectal bleeding underwent oversew of rectal varices per Dr. Coralie Keens 06/25/2014 as well as TIPS with obliteration/embolization of the hemorrhoidal veins per interventional radiology 07/02/2014  Subjective/Complaints: Ambulating in room, upset because he didn't make to to [facility] on time, waited on staff Squatting to clean up inc stool with washcloth without loss of balance  Objective: Vital Signs: Blood pressure 136/84, pulse 79, temperature 98.8 F (37.1 C), temperature source Oral, resp. rate 18, height _0  (1.753 m), weight 93.3 kg (205 lb 11 oz), SpO2 100.00%. No results found. Results for orders placed during the hospital encounter of 07/05/14 (from the past 72 hour(s))  GLUCOSE, CAPILLARY     Status: None   Collection Time    07/05/14  5:02 PM      Result Value Ref Range   Glucose-Capillary 89  70 - 99 mg/dL   Comment 1 Notify RN    GLUCOSE, CAPILLARY     Status: Abnormal   Collection Time    07/05/14  8:56 PM      Result Value Ref Range   Glucose-Capillary 106 (*) 70 - 99 mg/dL   Comment 1 Notify RN    CBC WITH DIFFERENTIAL     Status: Abnormal   Collection Time    07/06/14  6:30 AM      Result Value Ref Range   WBC 12.8 (*) 4.0 - 10.5 K/uL   RBC 2.72 (*) 4.22 -  5.81 MIL/uL   Hemoglobin 8.2 (*) 13.0 - 17.0 g/dL   HCT 24.5 (*) 39.0 - 52.0 %   MCV 90.1  78.0 - 100.0 fL   MCH 30.1  26.0 - 34.0 pg   MCHC 33.5  30.0 - 36.0 g/dL   RDW 15.5  11.5 - 15.5 %   Platelets 123 (*) 150 - 400 K/uL   Neutrophils Relative % 78 (*) 43 - 77 %   Lymphocytes Relative 10 (*) 12 - 46 %   Monocytes Relative 10  3 - 12 %   Eosinophils Relative 1  0 - 5 %   Basophils Relative 1  0 - 1 %   Neutro Abs 10.0 (*) 1.7 - 7.7 K/uL   Lymphs Abs 1.3  0.7 - 4.0 K/uL   Monocytes Absolute 1.3 (*) 0.1 - 1.0 K/uL   Eosinophils Absolute 0.1  0.0 - 0.7 K/uL   Basophils Absolute 0.1  0.0 - 0.1 K/uL   RBC Morphology POLYCHROMASIA PRESENT     Comment: RARE NRBCs   WBC Morphology TOXIC GRANULATION     Comment: ATYPICAL LYMPHOCYTES  COMPREHENSIVE METABOLIC PANEL     Status: Abnormal   Collection Time    07/06/14  6:30 AM      Result Value Ref Range   Sodium 140  137 - 147 mEq/L   Potassium 3.9  3.7 - 5.3 mEq/L   Chloride 106  96 - 112 mEq/L   CO2 22  19 - 32 mEq/L   Glucose, Bld 89  70 - 99 mg/dL   BUN 20  6 - 23 mg/dL   Creatinine, Ser 1.40 (*) 0.50 - 1.35 mg/dL   Calcium 7.2 (*) 8.4 - 10.5 mg/dL   Total Protein 5.3 (*) 6.0 - 8.3 g/dL   Albumin 1.7 (*) 3.5 - 5.2 g/dL   AST 41 (*) 0 - 37 U/L   ALT 25  0 - 53 U/L   Alkaline Phosphatase 116  39 - 117 U/L   Total Bilirubin 2.2 (*) 0.3 - 1.2 mg/dL   GFR calc non Af Amer 56 (*) >90 mL/min   GFR calc Af Amer 65 (*) >90 mL/min   Comment: (NOTE)     The eGFR has been calculated using the CKD EPI equation.     This calculation has not been validated in all clinical situations.     eGFR's persistently <90 mL/min signify possible Chronic Kidney     Disease.   Anion gap 12  5 - 15  GLUCOSE, CAPILLARY     Status: None   Collection Time    07/06/14  7:16 AM      Result Value Ref Range   Glucose-Capillary 97  70 - 99 mg/dL   Comment 1 Notify RN    GLUCOSE, CAPILLARY     Status: Abnormal   Collection Time    07/06/14 11:21 AM       Result Value Ref Range   Glucose-Capillary 114 (*) 70 - 99 mg/dL   Comment 1 Notify RN    GLUCOSE, CAPILLARY     Status: Abnormal   Collection Time    07/06/14  4:27 PM      Result Value Ref Range   Glucose-Capillary 100 (*) 70 - 99 mg/dL  GLUCOSE, CAPILLARY     Status: Abnormal   Collection Time    07/06/14  9:09 PM      Result Value Ref Range   Glucose-Capillary 125 (*) 70 - 99 mg/dL   Comment 1 Notify RN    GLUCOSE, CAPILLARY     Status: Abnormal   Collection Time    07/07/14  7:35 AM      Result Value Ref Range   Glucose-Capillary 119 (*) 70 - 99 mg/dL   Comment 1 Notify RN         HENT:  Poor dentition. Oral mucosa moist Eyes: Conjunctivae and EOM are normal on right. LEFT eye enucleated Neck: Normal range of motion. Neck supple. No tracheal deviation present. No thyromegaly present.   Cardiovascular: Normal rate and regular rhythm.   Respiratory: Effort normal and breath sounds normal. No respiratory distress. Scattered wheezes GI: Bowel sounds are normal. He exhibits distension. Abdomen non-tender Neurological:  Patient is alert with flat affect. Seems to have only basic insight and awareness. He was appropriate for name, age date of birth. He did follow simple commands.  Moved all 4 limbs. Grossly 4- prox to 4/5 distally in UE's and 4-/5 prox HF to 4/5 with KE and ADF/APF. Sensed pain and light touch in all 4's.  Skin: Skin is warm and dry.  Psychiatric:  Flat, cooperative   Assessment/Plan: 1. Functional deficits secondary to deconditioning secondary to severe GI bleed which require 3+ hours per day of interdisciplinary therapy in a comprehensive inpatient rehab setting. Physiatrist is providing close team supervision and 24 hour management of active medical problems listed below. Physiatrist and rehab  team continue to assess barriers to discharge/monitor patient progress toward functional and medical goals. Discuss with team safety in home environment.   Functionally has progressed over the last few days. Pt insisting on D/C, recheck CBC otherwise looks medically stable FIM: FIM - Bathing Bathing Steps Patient Completed: Chest;Right Arm;Left Arm;Abdomen;Front perineal area;Buttocks;Right upper leg;Left upper leg;Right lower leg (including foot);Left lower leg (including foot) Bathing: 5: Supervision: Safety issues/verbal cues  FIM - Upper Body Dressing/Undressing Upper body dressing/undressing: 0: Wears gown/pajamas-no public clothing FIM - Lower Body Dressing/Undressing Lower body dressing/undressing steps patient completed: Don/Doff right sock;Don/Doff left sock;Don/Doff right shoe Lower body dressing/undressing: 0: Wears Interior and spatial designer        FIM - Control and instrumentation engineer Devices: Arm rests Bed/Chair Transfer: 5: Supine > Sit: Supervision (verbal cues/safety issues);4: Bed > Chair or W/C: Min A (steadying Pt. > 75%);5: Sit > Supine: Supervision (verbal cues/safety issues);4: Chair or W/C > Bed: Min A (steadying Pt. > 75%)  FIM - Locomotion: Wheelchair Locomotion: Wheelchair: 0: Activity did not occur (pt ambulatory) FIM - Locomotion: Ambulation Locomotion: Ambulation Assistive Devices: Occupational psychologist (comment) (L HHA) Ambulation/Gait Assistance: 4: Min assist Locomotion: Ambulation: 4: Travels 150 ft or more with minimal assistance (Pt.>75%)  Comprehension Comprehension Mode: Auditory Comprehension: 4-Understands basic 75 - 89% of the time/requires cueing 10 - 24% of the time  Expression Expression Mode: Verbal Expression: 4-Expresses basic 75 - 89% of the time/requires cueing 10 - 24% of the time. Needs helper to occlude trach/needs to repeat words.  Social Interaction Social Interaction: 3-Interacts appropriately 50 - 74% of the time - May be physically or verbally inappropriate.  Problem Solving Problem Solving: 4-Solves basic 75 - 89% of the time/requires cueing 10 - 24%  of the time  Memory Memory: 3-Recognizes or recalls 50 - 74% of the time/requires cueing 25 - 49% of the time   Medical Problem List and Plan:   1. Functional deficits secondary to deconditioning due to GI bleed/multi-medical   2. DVT Prophylaxis/Anticoagulation: SCDs. Monitor for signs of DVT   3. Pain Management: Tylenol as needed   4. Mood/depression: Resume home regimen of Celexa 20 mg daily. Provide emotional support   5. Neuropsych: This patient is not capable of making decisions on his own behalf.   6. Anemia secondary to GI bleed. Status post transfusion/TIPS procedure with embolization. Latest hemoglobin 8.1. Followup CBC.   7. Dysphagia.   8. Hypertension. Coreg 6.25 mg twice a day. Monitor with increased mobility   9. Chronic renal insufficiency. Admission creatinine 3.43. Followup chemistries   10. Blindness left eye.   11. Grade 2 diastolic congestive heart failure. Monitor for any signs of fluid overload   12. Diabetes mellitus with peripheral neuropathy. Diet controlled. Check blood sugars a.c. and at bedtime. Continue sliding scale  13.  Incont stool but was waiting for staff LOS (Days) 2 A FACE TO FACE EVALUATION WAS PERFORMED  KIRSTEINS,ANDREW E 07/07/2014, 8:44 AM

## 2014-07-07 NOTE — Discharge Summary (Signed)
I discussed with  Dr Lorenso Courier.  I agree with their plans documented in their progress note.

## 2014-07-07 NOTE — Discharge Summary (Signed)
Discharge summary job # [id number]

## 2014-07-07 NOTE — Discharge Instructions (Signed)
Inpatient Rehab Discharge Instructions  Edwin Love Discharge date and time: No discharge date for patient encounter.   Activities/Precautions/ Functional Status: Activity: activity as tolerated Diet: regular diet Wound Care: none needed Functional status:  ___ No restrictions     ___ Walk up steps independently _x__ 24/7 supervision/assistance   ___ Walk up steps with assistance ___ Intermittent supervision/assistance  ___ Bathe/dress independently ___ Walk with walker     ___ Bathe/dress with assistance ___ Walk Independently    ___ Shower independently _x__ Walk with assistance    ___ Shower with assistance ___ No alcohol     ___ Return to work/school ________  Special Instructions:    COMMUNITY REFERRALS UPON DISCHARGE:    Home Health:   RN  [place] DDUKG:[phone] Date of last service:07/08/2014 NOT ELIGIBLE FOR [place] DUE TO MEDICAID ONLY NOT INTERESTED IN SUBSTANCE ABUSE [place]  My questions have been answered and I understand these instructions. I will adhere to these goals and the provided educational materials after my discharge from the hospital.  Patient/Caregiver Signature _______________________________ Date __________  Clinician Signature _______________________________________ Date __________  Please bring this form and your medication list with you to all your follow-up doctor's appointments.

## 2014-07-07 NOTE — Progress Notes (Signed)
Occupational Therapy Session Note  Patient Details  Name: Edwin Love MRN: [id number] Date of Birth: 1961/11/08  Today's Date: 07/07/2014 Time: 1022-1107 Time Calculation (min): 45 min  Short Term Goals: Week 1:  OT Short Term Goal 1 (Week 1): STG=LTGs  Skilled Therapeutic Interventions/Progress Updates:    Pt seen this session for ADL retraining with a focus on activity tolerance and standing balance. Pt stated he had already bathed with nurse tech and gotten dressed with the PT. Pt was sitting in recliner at start of session. He used cane to ambulate into hallway and gather new towels and washcloths for his room. Pt carried towels back to his room. He then stood at sink for 15 minutes to complete all grooming and shaving.  He then ambulated to gym with SPC to work on standing UE strengthening with 5 pound dowel bar. Pt demonstrated good balance and endurance. He then ambulated to kitchen and walked around kitchen without AD with no LOB.  He practiced reaching into cupboards, cabinets, oven and refrigerator.  He then walked back to his room. Pt had no LOB and did not require any assistance. Pt is safe to be mod I in his room and is ready for discharge to home after he completes his PM therapy sessions.    Therapy Documentation Precautions:  Precautions Precautions: Fall Restrictions Weight Bearing Restrictions: No   Pain: Pain Assessment Pain Assessment: No/denies pain  ADL:  See FIM for current functional status  Therapy/Group: Individual Therapy  [place] 07/07/2014, 12:23 PM

## 2014-07-07 NOTE — Discharge Summary (Signed)
NAMEJERMINE, Edwin Love        ACCOUNT NO.:  [id number]  MEDICAL RECORD NO.:  [id number]  LOCATION:  4W09C                        FACILITY:  [facility]  PHYSICIAN:  Charlett Blake, M.D.DATE OF BIRTH:  Apr 27, 1961  DATE OF ADMISSION:  07/05/2014 DATE OF DISCHARGE:  07/08/2014                              DISCHARGE SUMMARY   DISCHARGE DIAGNOSES: 1. Functional deficits secondary to deconditioning due to     gastrointestinal bleed - multi-medical. 2. Sequential compression devices for deep venous thrombosis     prophylaxis. 3. Depression. 4. Anemia secondary to gastrointestinal bleed. 5. Dysphagia. 6. Hypertension. 7. Chronic renal insufficiency with admission creatinine 3.43. 8. Blindness, left eye. 9. Grade 2 diastolic congestive heart failure. 10.Diabetes mellitus with peripheral neuropathy.  HISTORY OF PRESENT ILLNESS:  This is a 53 year old right-handed male with history of chronic renal insufficiency, blindness of the right eye as well as cirrhosis, and esophageal varices, who was independent with a cane, living with his parents prior to admission.  Admitted on June 24, 2014, after being found lying in a pool of blood by his family.  CT of the abdomen and pelvis showed partial thrombosis of the portal vein, multiple new hepatic lesions noted as well as extensive small bowel wall edema.  Hemoglobin upon admission 6.2, he was transfused.  Underwent EGD on June 24, 2014, negative for upper GI bleed.  Bleeding scan colonoscopy positive for lower GI bleed and the sigmoid colon per workup of Gastroenterology, Dr. [name].  The patient with persistent rectal bleeding, underwent oversew of rectal varices per Dr. Coralie Keens on June 25, 2014, as well as TIPS procedure with obliteration, embolization of the hemorrhoidal veins per Interventional Radiology. Physical and Occupational therapy ongoing.  The patient was admitted for comprehensive rehab program.  PAST MEDICAL  HISTORY:  See discharge diagnoses.  SOCIAL HISTORY:  Lives with elderly parents.  FUNCTIONAL HISTORY:  Prior to admission independent with occasional cane.  Functional status upon admission to rehab services, minimal assist, ambulate 12 feet with a rolling walker; min-to-mod assist, activities daily living.  PHYSICAL EXAMINATION:  VITAL SIGNS:  Blood pressure 118/77, pulse 86, temperature 98.6, respirations 21. GENERAL:  This was an alert male, in no acute distress. HEENT:  Basic awareness and insight to his deficits, oriented x3, blindness in left eye. LUNGS:  Clear to auscultation. CARDIAC:  Regular rate and rhythm. ABDOMEN:  Soft, nontender.  Good bowel sounds.  REHABILITATION HOSPITAL COURSE:  The patient was admitted to inpatient rehab services with therapies initiated on a 3-hour daily basis consisting of physical therapy, occupational therapy, speech therapy, and rehabilitation nursing.  The following issues were addressed during the patient's rehabilitation stay.  Pertaining to Mr. Doyon, deconditioning due to GI bleed, multi-medical remained stable.  He participated fully with therapies.  He would follow Gastroenterology for lower GI bleed.  Recent TIPS procedure for embolization.  Hemoglobin and hematocrit remained stable.  No other bleeding episodes.  Blood pressures controlled on Coreg.  He did have a history of chronic renal insufficiency, baseline creatinine 3.43, latest creatinine much improved at 1.40 and monitored.  His diet was slowly advanced as tolerated.  He exhibited no signs of fluid overload.  His blood sugar is well- controlled with documented  history of diet-controlled diabetes mellitus and noticed full diabetic teaching completed.  The patient received weekly collaborative interdisciplinary team conferences to discuss estimated length of stay, family teaching, and any barriers to discharge.  He performed ambulation with a straight point cane  greater than 300 feet in a controlled environment with supervision.  Performed car transfer training with simulated car, performing transfers modified independent without cues needed.  Performed a stair training for home supervision level.  He was able to gather belongings for the necessary activities of daily living with bathing, dressing, and grooming.  Plans will be discharged to home with ongoing home health therapies.  Physical and occupational therapy as advised.  DISCHARGE MEDICATIONS: 1. Coreg 6.25 mg p.o. b.i.d. 2. Celexa 20 mg p.o. daily. 3. Folic acid 1 mg p.o. daily. 4. Protonix 40 mg p.o. daily.  DIET:  Regular consistency.  SPECIAL INSTRUCTIONS:  The patient would follow up with Dr. Alysia Penna at the outpatient rehab center as needed.  Dr. Erskine Emery, Gastroenterology 2 weeks call for appointment.  Dr. Coralie Keens, 2 weeks call for appointment.  Case manager arranged follow up with the PCP.  Ongoing home health, physical outpatient therapies arranged.     Lauraine Rinne, P.A.   ______________________________ Charlett Blake, M.D.    DA/MEDQ  D:  07/07/2014  T:  07/07/2014  Job:  [id number]  cc:   Charlett Blake, M.D. Coralie Keens, M.D. Sandy Salaam. [name], MD,FACG

## 2014-07-07 NOTE — Progress Notes (Signed)
Physical Therapy Session Note  Patient Details  Name: Edwin Love MRN: [id number] Date of Birth: 1961/11/08  Today's Date: 07/07/2014 Time: 1340-1410 Time Calculation (min): 30 min  Short Term Goals: Week 1:  PT Short Term Goal 1 (Week 1): = LTGs due to anticipated LOS  Skilled Therapeutic Interventions/Progress Updates:   Pt received semi reclined in bed, agreeable to therapy. Pt transferred supine > sit and sit <> stand from bed with mod I. Gait training 2 x 1000 ft with overall mod I and no LOB with use of SPC throughout rehab unit and in community/outdoor environments, including hospital lobby, inclines/declines, on/off elevators, over thresholds, and concrete, brick and tiled surfaces. Pt negotiated up/down 5 brick steps with 1 rail and holding cane in opp hand and negotiated up/down curb with use of SPC and close supervision, vc's for safety. Pt returned to room and left sitting in recliner with all needs within reach.   Therapy Documentation Precautions:  Precautions Precautions: Fall Restrictions Weight Bearing Restrictions: No No pain  See FIM for current functional status  Therapy/Group: Individual Therapy  Laretta Alstrom 07/07/2014, 2:08 PM

## 2014-07-07 NOTE — Patient Care Conference (Signed)
Inpatient RehabilitationTeam Conference and Plan of Care Update Date: 07/07/2014   Time: 10;30 AM    Patient Name: Edwin Love      Medical Record Number: [id number]  Date of Birth: 1961-03-13 Sex: Male         Room/Bed: 4W09C/4W09C-01 Payor Info: Payor: MEDICAID [place] / Plan: MEDICAID OF Edwin / Product Type: *No Product type* /    Admitting Diagnosis: Deconditioning  Admit Date/Time:  07/05/2014  3:24 PM Admission Comments: No comment available   Primary Diagnosis:  <principal problem not specified> Principal Problem: <principal problem not specified>  Patient Active Problem List   Diagnosis Date Noted  . Physical deconditioning 07/05/2014  . Hemorrhagic shock 06/26/2014  . Acute respiratory failure 06/26/2014  . Acute kidney injury 06/26/2014  . 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 Hypotension, unspecified 06/25/2014  . Acute posthemorrhagic anemia 06/24/2014  . Blood in stool 06/24/2014  . Alcoholic cirrhosis 09/73/5329  . Esophageal varices without bleeding 06/24/2014  . GI bleed 06/24/2014  . Lower GI bleed 06/24/2014  . Incarcerated inguinal hernia 02/08/2014  . Chest pain 01/29/2014  . Tachycardia 01/29/2014  . CKD (chronic kidney disease) stage 3, GFR 30-59 ml/min 01/29/2014  . Tobacco abuse 01/29/2014  . Depression 01/29/2014    Expected Discharge Date: Expected Discharge Date: 07/08/14  Team Members Present: Physician leading conference: Dr. Alysia Penna Social Worker Present: Ovidio Kin, LCSW Nurse Present: Other (comment) Barbette Hair) PT Present: Raylene Everts, PT;Blair Hobble, PT OT Present: Willeen Cass, OT SLP Present: Windell Moulding, SLP PPS Coordinator present : Ileana Ladd, Lelan Pons, RN, CRRN     Current Status/Progress Goal Weekly Team Focus  Medical   Alcohol abuse, Anemia related to GI Bleed  Mod I level  D/C planning   Bowel/Bladder   Cont. of bladder and bowel, LBM 07/06/14  To remain cont. bladder and bowel  Maintain toileting  habits   Swallow/Nutrition/ Hydration     WFL        ADL's   Overall Supervision-steady assist   Mod I overall except Supervision for simple cooking task and for shower transfer  activity tolerance, dynamic balance, bathroom transfers, patient education.   Mobility   supervision overall; mod I floor transfers and car transfer  Mod I except supervision stairs  higher level gait and balance training, endurance   Communication     [facility]        Safety/Cognition/ Behavioral Observations    no unsafe behaviors        Pain   No c/o pain   Pain <3  Assess and treat pain q shift   Skin   Blister L arm OTA, Labd abrasion OTA  No new skin breakdown   Assess skin q shift      *See Care Plan and progress notes for long and short-term goals.  Barriers to Discharge: pt with ETOH abuse , not interested in treatment, says he will just quit    Possible Resolutions to Barriers:  Involve HH to monitor pt    Discharge Planning/Teaching Needs:  Returning to parent's home needs to be mod/i level-they can only provide supervision level      Team Discussion:  Making good progress-back to baseline functioning.  Mod/i in room this afternoon and ready for d/c tomorrow. MD discussing pt's alcohol and drug usage.    Revisions to Treatment Plan:  None   Continued Need for Acute Rehabilitation Level of Care: The patient requires daily medical management by a physician with specialized training  in physical medicine and rehabilitation for the following conditions: Daily direction of a multidisciplinary physical rehabilitation program to ensure safe treatment while eliciting the highest outcome that is of practical value to the patient.: Yes Daily medical management of patient stability for increased activity during participation in an intensive rehabilitation regime.: Yes Daily analysis of laboratory values and/or radiology reports with any subsequent need for medication adjustment of medical intervention for  : Neurological problems;Other  Edwin Love, Edwin Love 07/07/2014, 1:30 PM

## 2014-07-07 NOTE — Progress Notes (Signed)
Occupational Therapy Discharge Summary  Patient Details  Name: Edwin Love MRN: [id number] Date of Birth: [birth year]  Today's Date: 07/07/2014  Patient has met 10 of 10 long term goals due to improved activity tolerance and improved balance.  Patient to discharge at overall Modified Independent level.  Patient's care partner is independent to provide the necessary physical and cognitive assistance at discharge.    Reasons goals not met: n/a  Recommendation:  No further OT services recommended.  Equipment: No equipment provided  Reasons for discharge: treatment goals met  Patient/family agrees with progress made and goals achieved: Yes  OT Discharge ADL ADL ADL Comments: mod I overall Vision/Perception  Vision- History Patient Visual Report: No change from baseline Perception Comments: Blind in L eye  Cognition Overall Cognitive Status: History of cognitive impairments - at baseline Sensation Sensation Light Touch: Appears Intact Stereognosis: Appears Intact Hot/Cold: Appears Intact Proprioception: Appears Intact Coordination Gross Motor Movements are Fluid and Coordinated: Yes Fine Motor Movements are Fluid and Coordinated: Yes Motor  Motor Motor: Within Functional Limits Mobility    mod I with transfers for ADLs Trunk/Postural Assessment  Thoracic Assessment Thoracic Assessment: Exceptions to [facility] (kyphotic)  Balance Dynamic Standing Balance Dynamic Standing - Level of Assistance: 6: Modified independent (Device/Increase time) (mod I for Basic ADLs) Extremity/Trunk Assessment RUE Assessment RUE Assessment: Within Functional Limits LUE Assessment LUE Assessment: Within Functional Limits  See FIM for current functional status  SAGUIER,JULIA 07/07/2014, 12:32 PM

## 2014-07-07 NOTE — Progress Notes (Signed)
Social Work Patient ID: Edwin Love, male   DOB: 17-May-1961, 53 y.o.   MRN: [id number] Met with pt and spoke with Mom via telephone to inform of team conference goals of mod/i level and discharge 7/30.   Mom wanted to be sure he is back to baseline, which he was ambulating with a cane prior to admission.  Discussed the main issue Is his drug use and she will discuss with him also, she was hoping this was a wakeup call, to change his ways.  She reports she just buried A daughter with similar issues.  They will plan to be here tomorrow at 10;00.  Discussed with pt his drug usage he will think about it. He declines resources at this time.

## 2014-07-08 DIAGNOSIS — J9600 Acute respiratory failure, unspecified whether with hypoxia or hypercapnia: Secondary | ICD-10-CM

## 2014-07-08 DIAGNOSIS — F3289 Other specified depressive episodes: Secondary | ICD-10-CM

## 2014-07-08 DIAGNOSIS — R5381 Other malaise: Secondary | ICD-10-CM

## 2014-07-08 DIAGNOSIS — K922 Gastrointestinal hemorrhage, unspecified: Secondary | ICD-10-CM

## 2014-07-08 DIAGNOSIS — F329 Major depressive disorder, single episode, unspecified: Secondary | ICD-10-CM

## 2014-07-08 LAB — GLUCOSE, CAPILLARY: Glucose-Capillary: 111 mg/dL — ABNORMAL HIGH (ref 70–99)

## 2014-07-08 MED ORDER — PANTOPRAZOLE SODIUM 40 MG PO TBEC: 40.0000 mg | DELAYED_RELEASE_TABLET | Freq: Every day | ORAL | Status: DC

## 2014-07-08 MED ORDER — CITALOPRAM HYDROBROMIDE 20 MG PO TABS: 20.0000 mg | ORAL_TABLET | Freq: Every day | ORAL | Status: DC

## 2014-07-08 MED ORDER — FOLIC ACID 1 MG PO TABS: 1.0000 mg | ORAL_TABLET | Freq: Every day | ORAL | Status: DC

## 2014-07-08 MED ORDER — CARVEDILOL 6.25 MG PO TABS: 6.2500 mg | ORAL_TABLET | Freq: Two times a day (BID) | ORAL | Status: DC

## 2014-07-08 MED ORDER — FERROUS SULFATE 325 (65 FE) MG PO TABS: 325.0000 mg | ORAL_TABLET | Freq: Three times a day (TID) | ORAL | Status: DC

## 2014-07-08 NOTE — Progress Notes (Signed)
Subjective/Complaints: Discussed ETOH cessation- pt went to AA before, we discussed getting back in touch with sponsor  No bowel or bladder inc  no abd pain   Review of Systems - Negative except except as above Objective: Vital Signs: Blood pressure 146/78, pulse 72, temperature 98.9 F (37.2 C), temperature source Oral, resp. rate 18, height $RemoveBe'5\' 9"'mlsfDtFvu$  (1.753 m), weight 93.8 kg (206 lb 12.7 oz), SpO2 100.00%. No results found. Results for orders placed during the hospital encounter of 07/05/14 (from the past 72 hour(s))  GLUCOSE, CAPILLARY     Status: None   Collection Time    07/05/14  5:02 PM      Result Value Ref Range   Glucose-Capillary 89  70 - 99 mg/dL   Comment 1 Notify RN    GLUCOSE, CAPILLARY     Status: Abnormal   Collection Time    07/05/14  8:56 PM      Result Value Ref Range   Glucose-Capillary 106 (*) 70 - 99 mg/dL   Comment 1 Notify RN    CBC WITH DIFFERENTIAL     Status: Abnormal   Collection Time    07/06/14  6:30 AM      Result Value Ref Range   WBC 12.8 (*) 4.0 - 10.5 K/uL   RBC 2.72 (*) 4.22 - 5.81 MIL/uL   Hemoglobin 8.2 (*) 13.0 - 17.0 g/dL   HCT 24.5 (*) 39.0 - 52.0 %   MCV 90.1  78.0 - 100.0 fL   MCH 30.1  26.0 - 34.0 pg   MCHC 33.5  30.0 - 36.0 g/dL   RDW 15.5  11.5 - 15.5 %   Platelets 123 (*) 150 - 400 K/uL   Neutrophils Relative % 78 (*) 43 - 77 %   Lymphocytes Relative 10 (*) 12 - 46 %   Monocytes Relative 10  3 - 12 %   Eosinophils Relative 1  0 - 5 %   Basophils Relative 1  0 - 1 %   Neutro Abs 10.0 (*) 1.7 - 7.7 K/uL   Lymphs Abs 1.3  0.7 - 4.0 K/uL   Monocytes Absolute 1.3 (*) 0.1 - 1.0 K/uL   Eosinophils Absolute 0.1  0.0 - 0.7 K/uL   Basophils Absolute 0.1  0.0 - 0.1 K/uL   RBC Morphology POLYCHROMASIA PRESENT     Comment: RARE NRBCs   WBC Morphology TOXIC GRANULATION     Comment: ATYPICAL LYMPHOCYTES  COMPREHENSIVE METABOLIC PANEL     Status: Abnormal   Collection Time    07/06/14  6:30 AM      Result Value Ref Range    Sodium 140  137 - 147 mEq/L   Potassium 3.9  3.7 - 5.3 mEq/L   Chloride 106  96 - 112 mEq/L   CO2 22  19 - 32 mEq/L   Glucose, Bld 89  70 - 99 mg/dL   BUN 20  6 - 23 mg/dL   Creatinine, Ser 1.40 (*) 0.50 - 1.35 mg/dL   Calcium 7.2 (*) 8.4 - 10.5 mg/dL   Total Protein 5.3 (*) 6.0 - 8.3 g/dL   Albumin 1.7 (*) 3.5 - 5.2 g/dL   AST 41 (*) 0 - 37 U/L   ALT 25  0 - 53 U/L   Alkaline Phosphatase 116  39 - 117 U/L   Total Bilirubin 2.2 (*) 0.3 - 1.2 mg/dL   GFR calc non Af Amer 56 (*) >90 mL/min   GFR calc Af Amer 65 (*) >  90 mL/min   Comment: (NOTE)     The eGFR has been calculated using the CKD EPI equation.     This calculation has not been validated in all clinical situations.     eGFR's persistently <90 mL/min signify possible Chronic Kidney     Disease.   Anion gap 12  5 - 15  GLUCOSE, CAPILLARY     Status: None   Collection Time    07/06/14  7:16 AM      Result Value Ref Range   Glucose-Capillary 97  70 - 99 mg/dL   Comment 1 Notify RN    GLUCOSE, CAPILLARY     Status: Abnormal   Collection Time    07/06/14 11:21 AM      Result Value Ref Range   Glucose-Capillary 114 (*) 70 - 99 mg/dL   Comment 1 Notify RN    GLUCOSE, CAPILLARY     Status: Abnormal   Collection Time    07/06/14  4:27 PM      Result Value Ref Range   Glucose-Capillary 100 (*) 70 - 99 mg/dL  GLUCOSE, CAPILLARY     Status: Abnormal   Collection Time    07/06/14  9:09 PM      Result Value Ref Range   Glucose-Capillary 125 (*) 70 - 99 mg/dL   Comment 1 Notify RN    GLUCOSE, CAPILLARY     Status: Abnormal   Collection Time    07/07/14  7:35 AM      Result Value Ref Range   Glucose-Capillary 119 (*) 70 - 99 mg/dL   Comment 1 Notify RN    CBC     Status: Abnormal   Collection Time    07/07/14  9:40 AM      Result Value Ref Range   WBC 10.7 (*) 4.0 - 10.5 K/uL   RBC 2.75 (*) 4.22 - 5.81 MIL/uL   Hemoglobin 8.2 (*) 13.0 - 17.0 g/dL   HCT 25.1 (*) 39.0 - 52.0 %   MCV 91.3  78.0 - 100.0 fL   MCH 29.8   26.0 - 34.0 pg   MCHC 32.7  30.0 - 36.0 g/dL   RDW 15.6 (*) 11.5 - 15.5 %   Platelets 158  150 - 400 K/uL  GLUCOSE, CAPILLARY     Status: None   Collection Time    07/07/14 11:10 AM      Result Value Ref Range   Glucose-Capillary 89  70 - 99 mg/dL   Comment 1 Notify RN    GLUCOSE, CAPILLARY     Status: Abnormal   Collection Time    07/07/14  4:25 PM      Result Value Ref Range   Glucose-Capillary 125 (*) 70 - 99 mg/dL   Comment 1 Notify RN    GLUCOSE, CAPILLARY     Status: Abnormal   Collection Time    07/07/14  8:42 PM      Result Value Ref Range   Glucose-Capillary 114 (*) 70 - 99 mg/dL  GLUCOSE, CAPILLARY     Status: Abnormal   Collection Time    07/08/14  7:15 AM      Result Value Ref Range   Glucose-Capillary 111 (*) 70 - 99 mg/dL       HENT:  Poor dentition. Oral mucosa moist Eyes: Conjunctivae and EOM are normal on right. LEFT eye enucleated Neck: Normal range of motion. Neck supple. No tracheal deviation present. No thyromegaly present.   Cardiovascular: Normal  rate and regular rhythm.   Respiratory: Effort normal and breath sounds normal. No respiratory distress. Scattered wheezes GI: Bowel sounds are normal. He exhibits distension. Abdomen non-tender Neurological:  Patient is alert with flat affect. Seems to have only basic insight and awareness. He was appropriate for name, age date of birth. He did follow simple commands.  Moved all 4 limbs. Grossly 4- prox to 4/5 distally in UE's and 4-/5 prox HF to 4/5 with KE and ADF/APF. Sensed pain and light touch in all 4's.  Skin: Skin is warm and dry.  Psychiatric:  Flat, cooperative   Assessment/Plan: 1. Functional deficits secondary to deconditioning secondary to severe GI bleed which require 3+ hours per day of interdisciplinary therapy in a comprehensive inpatient rehab setting. No problem I in room last noc Stable for D/C today F/u PCP in 1-2 weeks F/u PM&R not needed See D/C summary See D/C  instructions FIM: FIM - Bathing Bathing Steps Patient Completed: Chest;Right Arm;Left Arm;Abdomen;Front perineal area;Buttocks;Right upper leg;Left upper leg;Right lower leg (including foot);Left lower leg (including foot) Bathing: 6: More than reasonable amount of time  FIM - Upper Body Dressing/Undressing Upper body dressing/undressing steps patient completed: Thread/unthread right sleeve of pullover shirt/dresss;Thread/unthread left sleeve of pullover shirt/dress;Put head through opening of pull over shirt/dress;Pull shirt over trunk Upper body dressing/undressing: 6: More than reasonable amount of time FIM - Lower Body Dressing/Undressing Lower body dressing/undressing steps patient completed: Don/Doff right sock;Don/Doff left sock;Don/Doff right shoe;Thread/unthread right pants leg;Thread/unthread left pants leg;Pull pants up/down;Don/Doff left shoe;Fasten/unfasten right shoe;Fasten/unfasten left shoe Lower body dressing/undressing: 6: More than reasonable amount of time  FIM - Toileting Toileting: 6: More than reasonable amount of time     FIM - Control and instrumentation engineer Devices: Arm rests;Cane Bed/Chair Transfer: 6: Bed > Chair or W/C: No assist;6: Chair or W/C > Bed: No assist  FIM - Locomotion: Wheelchair Locomotion: Wheelchair: 0: Activity did not occur (Pt ambulatory on unit) FIM - Locomotion: Ambulation Locomotion: Ambulation Assistive Devices: Journalist, newspaper Ambulation/Gait Assistance: 6: Modified independent (Device/Increase time) Locomotion: Ambulation: 6: Travels 150 ft or more with assistive device/no helper  Comprehension Comprehension Mode: Auditory Comprehension: 4-Understands basic 75 - 89% of the time/requires cueing 10 - 24% of the time  Expression Expression Mode: Verbal Expression: 4-Expresses basic 75 - 89% of the time/requires cueing 10 - 24% of the time. Needs helper to occlude trach/needs to repeat words.  Social  Interaction Social Interaction: 3-Interacts appropriately 50 - 74% of the time - May be physically or verbally inappropriate.  Problem Solving Problem Solving: 4-Solves basic 75 - 89% of the time/requires cueing 10 - 24% of the time  Memory Memory: 3-Recognizes or recalls 50 - 74% of the time/requires cueing 25 - 49% of the time   Medical Problem List and Plan:   1. Functional deficits secondary to deconditioning due to GI bleed/multi-medical   2. DVT Prophylaxis/Anticoagulation: SCDs. Monitor for signs of DVT   3. Pain Management: Tylenol as needed   4. Mood/depression: Resume home regimen of Celexa 20 mg daily. Provide emotional support   5. Neuropsych: This patient is not capable of making decisions on his own behalf.   6. Anemia secondary to GI bleed. Status post transfusion/TIPS procedure with embolization. Latest hemoglobin 8.1. Followup CBC.   7. Dysphagia.   8. Hypertension. Coreg 6.25 mg twice a day. Monitor with increased mobility   9. Chronic renal insufficiency. Admission creatinine 3.43. Followup chemistries   10. Blindness left eye.   11.  Grade 2 diastolic congestive heart failure. Monitor for any signs of fluid overload   12. Diabetes mellitus with peripheral neuropathy. Diet controlled. Check blood sugars a.c. and at bedtime. Continue sliding scale  13.  Incont stool resolved LOS (Days) 3 A FACE TO FACE EVALUATION WAS PERFORMED  Edwin Love E 07/08/2014, 8:01 AM

## 2014-07-08 NOTE — IPOC Note (Signed)
Overall Plan of Care [facility]) Patient Details Name: Edwin Love MRN: [id number] DOB: 1961-01-06  Admitting Diagnosis: Deconditioning  Hospital Problems: Active Problems:   Physical deconditioning     Functional Problem List: Nursing Behavior;Endurance;Medication Management;Motor;Safety  PT Balance;Endurance;Motor;Pain;Safety  OT Balance;Endurance;Safety;Cognition  SLP Safety;Cognition  TR         Basic ADL's: OT Grooming;Bathing;Dressing;Toileting     Advanced  ADL's: OT Light Housekeeping;Simple Meal Preparation     Transfers: PT Bed Mobility;Bed to Chair;Car;Furniture;Floor  OT Toilet;Tub/Shower     Locomotion: PT Ambulation;Wheelchair Mobility;Stairs     Additional Impairments: OT    SLP None      TR      Anticipated Outcomes Item Anticipated Outcome  Self Feeding    Swallowing      Basic self-care  Mod I  Toileting  Mod I   Bathroom Transfers Mod I for toilet transfer, Supervision for shower transfer  Bowel/Bladder  Continent of bowel and bladder  Transfers  mod I  Locomotion  mod I  Communication     Cognition     Pain  </=3  Safety/Judgment  No falls with injury, Mod I for safety   Therapy Plan: PT Intensity: Minimum of 1-2 x/day ,45 to 90 minutes PT Frequency: 5 out of 7 days PT Duration Estimated Length of Stay: 7 days OT Intensity: Minimum of 1-2 x/day, 45 to 90 minutes OT Frequency: 5 out of 7 days OT Duration/Estimated Length of Stay: 7 days         Team Interventions: Nursing Interventions Patient/Family Education;Pain Management;Medication Management;Psychosocial Support;Skin Care/Wound Management  PT interventions Ambulation/gait training;Balance/vestibular training;DME/adaptive equipment instruction;Functional mobility training;Neuromuscular re-education;Pain management;Patient/family education;Stair training;Therapeutic Activities;Therapeutic Exercise;UE/LE Strength taining/ROM;UE/LE Coordination activities  OT  Interventions Balance/vestibular training;DME/adaptive equipment instruction;Functional mobility training;Discharge planning;Patient/family education;Self Care/advanced ADL retraining;Therapeutic Activities  SLP Interventions    TR Interventions    SW/CM Interventions Discharge Planning;Psychosocial Support;Patient/Family Education    Team Discharge Planning: Destination: PT-Home ,OT- Home , SLP-Home Projected Follow-up: PT-None, OT-  24 hour supervision/assistance, SLP-None Projected Equipment Needs: PT-None recommended by PT, OT- Tub/shower seat, SLP-None recommended by SLP Equipment Details: PT-pt has SPC, OT-Shower DME TBA Patient/family involved in discharge planning: PT- Patient,  OT-Patient, SLP-Patient;Family member/caregiver  MD ELOS: 5-7 days Medical Rehab Prognosis:  Good Assessment: 53 y.o. right-handed male history of hypertension, grade 2 diastolic dysfunction, chronic renal insufficiency with baseline creatinine 3.43, blindness left eye, cirrhosis and esophageal varices. Patient independent with a cane living with his parents prior to admission. Admitted 06/24/2014 after being found lying in a pool of blood by his family.  CT abdomen and pelvis showed partial thrombosis of the portal vein, multiple new hepatic lesions noted as well as extensive small bowel wall edema. Hemoglobin upon admission 6.2 and was transfused. Underwent EGD 06/24/2014 negative for UGIB. Bleeding scan/colonoscopy positive for LGIB in the sigmoid colon per workup of gastroenterology Dr. [name].. Patient with persistent rectal bleeding underwent oversew of rectal varices per Dr. 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  Now requiring 24/7 Rehab RN,MD, as well as CIR level PT, OT and SLP.  Treatment team will focus on ADLs and mobility with goals set at Mod I   See Team Conference Notes for weekly updates to the  plan of care

## 2014-07-08 NOTE — Progress Notes (Signed)
Patient discharged to home accompanied by family

## 2014-07-08 NOTE — Progress Notes (Signed)
Social Work Discharge Note Discharge Note  The overall goal for the admission was met for:   Discharge location: Yes-HOME WITH PARENTS WHO CAN ONLY PROVIDE SUPERVISION LEVEL ONLY  Length of Stay: Yes-3 DAYS  Discharge activity level: Yes-MOD/I LEVEL  Home/community participation: Yes  Services provided included: MD, RD, PT, OT, SLP, RN, CM, Pharmacy and SW  Financial Services: Medicaid  Follow-up services arranged: Home Health: [facility]-RN NOT ELIGIBLE FOR OTHER SERVICES DUE TO MEDICAID ONLY AND DIAGNOSIS and Patient/Family has no preference for HH/DME agencies  Comments (or additional information):PT REFUSED SUBSTANCE ABUSE [place].    Patient/Family verbalized understanding of follow-up arrangements: Yes  Individual responsible for coordination of the follow-up plan: SELF & MARY-MOM  Confirmed correct DME delivered: Elease Hashimoto 07/08/2014    Elease Hashimoto

## 2014-07-13 ENCOUNTER — Telehealth: Payer: Self-pay | Admitting: Gastroenterology

## 2014-07-13 NOTE — Telephone Encounter (Signed)
Pt scheduled to see Nicoletta Ba PA 07/20/14@2pm . Pt aware of appt.

## 2014-07-20 ENCOUNTER — Encounter: Payer: Self-pay | Admitting: Physician Assistant

## 2014-07-20 ENCOUNTER — Ambulatory Visit (INDEPENDENT_AMBULATORY_CARE_PROVIDER_SITE_OTHER): Payer: Medicaid Other | Admitting: Physician Assistant

## 2014-07-20 ENCOUNTER — Ambulatory Visit: Payer: Medicaid Other

## 2014-07-20 VITALS — BP 158/92 | HR 88 | Ht 69.0 in | Wt 198.0 lb

## 2014-07-20 DIAGNOSIS — K649 Unspecified hemorrhoids: Secondary | ICD-10-CM

## 2014-07-20 DIAGNOSIS — K7460 Unspecified cirrhosis of liver: Secondary | ICD-10-CM

## 2014-07-20 DIAGNOSIS — R935 Abnormal findings on diagnostic imaging of other abdominal regions, including retroperitoneum: Secondary | ICD-10-CM

## 2014-07-20 DIAGNOSIS — D62 Acute posthemorrhagic anemia: Secondary | ICD-10-CM

## 2014-07-20 LAB — PROTIME-INR
INR: 1.3 ratio — ABNORMAL HIGH (ref 0.8–1.0)
Prothrombin Time: 14.1 s — ABNORMAL HIGH (ref 9.6–13.1)

## 2014-07-20 LAB — COMPREHENSIVE METABOLIC PANEL
ALT: 12 U/L (ref 0–53)
AST: 24 U/L (ref 0–37)
Albumin: 2 g/dL — ABNORMAL LOW (ref 3.5–5.2)
Alkaline Phosphatase: 141 U/L — ABNORMAL HIGH (ref 39–117)
BUN: 12 mg/dL (ref 6–23)
CO2: 18 meq/L — AB (ref 19–32)
CREATININE: 1.4 mg/dL (ref 0.4–1.5)
Calcium: 8.4 mg/dL (ref 8.4–10.5)
Chloride: 113 mEq/L — ABNORMAL HIGH (ref 96–112)
GFR: 70.34 mL/min (ref >60.00)
GLUCOSE: 87 mg/dL (ref 70–99)
Potassium: 3.7 mEq/L (ref 3.5–5.1)
SODIUM: 139 meq/L (ref 135–145)
TOTAL PROTEIN: 7.1 g/dL (ref 6.0–8.3)
Total Bilirubin: 1.2 mg/dL (ref 0.2–1.2)

## 2014-07-20 LAB — CBC WITH DIFFERENTIAL/PLATELET
BASOS ABS: 0.1 10*3/uL (ref 0.0–0.1)
Basophils Relative: 0.8 % (ref 0.0–3.0)
EOS ABS: 0.2 10*3/uL (ref 0.0–0.7)
Eosinophils Relative: 3 % (ref 0.0–5.0)
HCT: 27 % — ABNORMAL LOW (ref 39.0–52.0)
Hemoglobin: 8.8 g/dL — ABNORMAL LOW (ref 13.0–17.0)
Lymphocytes Relative: 22 % (ref 12.0–46.0)
Lymphs Abs: 1.7 10*3/uL (ref 0.7–4.0)
MCHC: 32.5 g/dL (ref 30.0–36.0)
MCV: 88.8 fl (ref 78.0–100.0)
MONO ABS: 0.8 10*3/uL (ref 0.1–1.0)
Monocytes Relative: 10.9 % (ref 3.0–12.0)
NEUTROS PCT: 63.3 % (ref 43.0–77.0)
Neutro Abs: 4.9 10*3/uL (ref 1.4–7.7)
PLATELETS: 157 10*3/uL (ref 150.0–400.0)
RBC: 3.04 Mil/uL — ABNORMAL LOW (ref 4.22–5.81)
RDW: 17.4 % — ABNORMAL HIGH (ref 11.5–15.5)
WBC: 7.7 10*3/uL (ref 4.0–10.5)

## 2014-07-20 LAB — FERRITIN: Ferritin: 35.3 ng/mL (ref 22.0–322.0)

## 2014-07-20 LAB — AMMONIA: AMMONIA: 40 umol/L — AB (ref 11–35)

## 2014-07-20 NOTE — Patient Instructions (Signed)
Please go to the basement level to have your labs drawn.   You have been scheduled for an MRI at [facility] on 8-20. Your appointment time is 7:00 PM . Please arrive at 6:45 PM prior to your appointment time for registration purposes. In addition, if you have any metal in your body, have a pacemaker or defibrillator, please be sure to let your ordering physician know. This test typically takes 45 minutes to 1 hour to complete.  Stop the oral iron tablets.  We will call you with the results.

## 2014-07-20 NOTE — Progress Notes (Signed)
Subjective:    Patient ID: Edwin Love, male    DOB: [birth year], 53 y.o.   MRN: [id number]  HPI  Edwin Love is a 53 year old African American male new to GI as of recent hospital admission 06/24/2014 at which time he presented with a GI bleed. He now appears a diagnosis of alcohol-induced cirrhosis with hepatitis C. He actually bled from rectal varices acutely, required multiple units of transfusions, oversewing of a rectal varix per surgery and then ultimately underwent a caps on 07/02/2014 per Dr. Laurence Love. He also had large volume paracentesis at that same time period Prior to the tips he had undergone EGD with Dr. Deatra Love was found to have grade 4 varices that were not acutely bleeding and not banded then had colonoscopy on 06/25/2014 he was noted to have a large rectal varix which was injected and endoclipped and then bled more acutely. He comes in today for a post hospital followup. He states that he has been doing well since discharge from the hospital a little over a week ago. He does have peripheral edema. He has no complaints of abdominal pain or discomfort and no distention. He has not had any evidence of rectal bleeding. He says he is taking the medication she was prescribed on discharge from the hospital. He admits that he is still drinking alcohol though not as much as he had been drinking previously. He knows that he was diagnosed with cirrhosis but was unclear about what exactly was found otherwise.  CT scan of the abdomen and pelvis on 06/24/2014 shows a cirrhotic liver with multiple new hepatic lesions since comparison study in March of 2015. Differential diagnosis includes regenerating nodules metastatic disease or multifocal hepatocellular carcinoma, he had an umbilical hernia with containing small bowel extensive small bowel wall edema and partial thrombosis of the portal vein as well as multiple varices Most recent labs on 07/07/2014 hemoglobin 8.2 hematocrit of 25.1  platelets 158.4 INR 1.42    Review of Systems  Constitutional: Positive for fatigue.  HENT: Negative.   Eyes: Negative.   Respiratory: Negative.   Cardiovascular: Positive for leg swelling.  Gastrointestinal: Negative.   Endocrine: Negative.   Genitourinary: Negative.   Musculoskeletal: Negative.   Skin: Negative.   Allergic/Immunologic: Negative.   Neurological: Negative.   Hematological: Negative.   Psychiatric/Behavioral: Negative.    Outpatient Prescriptions Prior to Visit  Medication Sig Dispense Refill  . carvedilol (COREG) 6.25 MG tablet Take 1 tablet (6.25 mg total) by mouth 2 (two) times daily with a meal.  60 tablet  1  . citalopram (CELEXA) 20 MG tablet Take 1 tablet (20 mg total) by mouth daily.  30 tablet  1  . ferrous sulfate 325 (65 FE) MG tablet Take 1 tablet (325 mg total) by mouth 3 (three) times daily.  90 tablet  3  . folic acid (FOLVITE) 1 MG tablet Take 1 tablet (1 mg total) by mouth daily.  30 tablet  1  . pantoprazole (PROTONIX) 40 MG tablet Take 1 tablet (40 mg total) by mouth daily at 6 (six) AM.  30 tablet  1   No facility-administered medications prior to visit.   No Known Allergies Patient Active Problem List   Diagnosis Date Noted  . Physical deconditioning 07/05/2014  . Hemorrhagic shock 06/26/2014  . Acute respiratory failure 06/26/2014  . Acute kidney injury 06/26/2014  . 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 Acute posthemorrhagic anemia 06/24/2014  . Alcoholic cirrhosis 86/76/1950  . Esophageal varices without bleeding 06/24/2014  .  Lower GI bleed 06/24/2014  . Incarcerated inguinal hernia 02/08/2014  . Chest pain 01/29/2014  . Tachycardia 01/29/2014  . CKD (chronic kidney disease) stage 3, GFR 30-59 ml/min 01/29/2014  . Tobacco abuse 01/29/2014  . Depression 01/29/2014   History  Substance Use Topics  . Smoking status: Current Every Day Smoker -- 0.50 packs/day for 34 years    Types: Cigarettes  . Smokeless tobacco: Never Used      Comment: Smoke since 54 years old  . Alcohol Use: Yes     Comment: 40 oz every 2-3 days   family history includes Diabetes in his brother; Hypertension in an other family member; Sickle cell anemia in his sister.     Objective:   Physical Exam  well-developed African American male in no acute distress blood pressure 158/92 pulse 88 height 5 foot 9 weight 198. HEENT; nontraumatic normocephalic EOMI PERRLA sclera anicteric, Supple; no JVD, Cardiovascular; regular rate and rhythm there is a soft systolic murmur, Pulmonary; clear bilaterally, Abdomen ;large soft no definite fluid wave no palpable mass or hepatosplenomegaly, bowel sounds are present, Rectal; exam not done, Extremities; he has 2+ edema in the right lower extremity,to the knee and 3+ edema in the left lower extremity to the knee,Neuro/ Psych ;mood and affect appropriate there is no asterixis        Assessment & Plan:  #37  53 year old African American male with alcoholic liver disease and decompensated cirrhosis. #2 status post recent acute major variceal hemorrhage from rectal varices-he required surgical oversewing then TIPS procedure #3 anemia acute on chronic secondary to recent bleed #4 multiple liver lesions seen on recent CT scan rule out multifocal HCC #5 history of congestive heart failure #6 peripheral edema #7 chronic kidney disease  Plan; Will repeat labs today to include CBC with differential, seeing that, serum iron, venous ammonia, alpha-fetoprotein level and protime #2 stop oral iron I don't see that he is ever had iron studies done #3 schedule for MRI of the liver. #4 discussion today with the patient regarding the events of his hospitalization and his diagnosis of decompensated alcoholic liver disease. He is still actively drinking he was advised that he should needs to be completely abstinent. #5 schedule followup with Dr. Deatra Love in 3-4 weeks

## 2014-07-21 LAB — AFP TUMOR MARKER: AFP TUMOR MARKER: 3.6 ng/mL (ref 0.0–8.0)

## 2014-07-22 NOTE — Progress Notes (Signed)
Reviewed and agree with management. He will need f/u sigmoidocopy Sandy Salaam. [name], M.D., [facility]

## 2014-07-28 ENCOUNTER — Other Ambulatory Visit (HOSPITAL_COMMUNITY): Payer: Self-pay | Admitting: Interventional Radiology

## 2014-07-28 DIAGNOSIS — K922 Gastrointestinal hemorrhage, unspecified: Secondary | ICD-10-CM

## 2014-07-28 DIAGNOSIS — K7031 Alcoholic cirrhosis of liver with ascites: Secondary | ICD-10-CM

## 2014-07-28 DIAGNOSIS — K649 Unspecified hemorrhoids: Secondary | ICD-10-CM

## 2014-07-29 ENCOUNTER — Ambulatory Visit (HOSPITAL_COMMUNITY)
Admission: RE | Admit: 2014-07-29 | Discharge: 2014-07-29 | Disposition: A | Discharge status: Home / Self Care | Payer: Medicaid Other | Source: Ambulatory Visit | Attending: Physician Assistant | Admitting: Physician Assistant

## 2014-07-29 DIAGNOSIS — K7460 Unspecified cirrhosis of liver: Secondary | ICD-10-CM | POA: Diagnosis present

## 2014-07-29 DIAGNOSIS — K7689 Other specified diseases of liver: Secondary | ICD-10-CM | POA: Insufficient documentation

## 2014-07-29 DIAGNOSIS — R935 Abnormal findings on diagnostic imaging of other abdominal regions, including retroperitoneum: Secondary | ICD-10-CM

## 2014-07-29 MED ORDER — GADOXETATE DISODIUM 0.25 MMOL/ML IV SOLN: 6.0000 mL | Freq: Once | INTRAVENOUS | Status: AC | PRN

## 2014-08-04 ENCOUNTER — Encounter (INDEPENDENT_AMBULATORY_CARE_PROVIDER_SITE_OTHER): Payer: Medicaid Other | Admitting: General Surgery

## 2014-08-15 ENCOUNTER — Emergency Department (HOSPITAL_COMMUNITY): Payer: Medicaid Other

## 2014-08-15 ENCOUNTER — Encounter (HOSPITAL_COMMUNITY): Payer: Self-pay | Admitting: Emergency Medicine

## 2014-08-15 ENCOUNTER — Inpatient Hospital Stay (HOSPITAL_COMMUNITY)
Admission: EM | Admit: 2014-08-15 | Discharge: 2014-08-16 | DRG: 897 | Disposition: A | Discharge status: Home / Self Care | Payer: Medicaid Other | Attending: Internal Medicine | Admitting: Internal Medicine

## 2014-08-15 DIAGNOSIS — R404 Transient alteration of awareness: Secondary | ICD-10-CM

## 2014-08-15 DIAGNOSIS — D689 Coagulation defect, unspecified: Secondary | ICD-10-CM | POA: Diagnosis present

## 2014-08-15 DIAGNOSIS — I8510 Secondary esophageal varices without bleeding: Secondary | ICD-10-CM | POA: Diagnosis present

## 2014-08-15 DIAGNOSIS — F10229 Alcohol dependence with intoxication, unspecified: Principal | ICD-10-CM | POA: Diagnosis present

## 2014-08-15 DIAGNOSIS — F1410 Cocaine abuse, uncomplicated: Secondary | ICD-10-CM | POA: Diagnosis present

## 2014-08-15 DIAGNOSIS — G928 Other toxic encephalopathy: Secondary | ICD-10-CM | POA: Diagnosis present

## 2014-08-15 DIAGNOSIS — I509 Heart failure, unspecified: Secondary | ICD-10-CM | POA: Diagnosis present

## 2014-08-15 DIAGNOSIS — N183 Chronic kidney disease, stage 3 unspecified: Secondary | ICD-10-CM | POA: Diagnosis present

## 2014-08-15 DIAGNOSIS — K7030 Alcoholic cirrhosis of liver without ascites: Secondary | ICD-10-CM | POA: Diagnosis present

## 2014-08-15 DIAGNOSIS — F17200 Nicotine dependence, unspecified, uncomplicated: Secondary | ICD-10-CM | POA: Diagnosis present

## 2014-08-15 DIAGNOSIS — R4182 Altered mental status, unspecified: Secondary | ICD-10-CM | POA: Diagnosis present

## 2014-08-15 DIAGNOSIS — D6959 Other secondary thrombocytopenia: Secondary | ICD-10-CM | POA: Diagnosis present

## 2014-08-15 DIAGNOSIS — G92 Toxic encephalopathy: Secondary | ICD-10-CM | POA: Diagnosis present

## 2014-08-15 DIAGNOSIS — K7031 Alcoholic cirrhosis of liver with ascites: Secondary | ICD-10-CM

## 2014-08-15 DIAGNOSIS — I5032 Chronic diastolic (congestive) heart failure: Secondary | ICD-10-CM | POA: Diagnosis present

## 2014-08-15 DIAGNOSIS — E8729 Other acidosis: Secondary | ICD-10-CM

## 2014-08-15 DIAGNOSIS — E872 Acidosis: Secondary | ICD-10-CM

## 2014-08-15 DIAGNOSIS — F1010 Alcohol abuse, uncomplicated: Secondary | ICD-10-CM

## 2014-08-15 DIAGNOSIS — Z79899 Other long term (current) drug therapy: Secondary | ICD-10-CM

## 2014-08-15 DIAGNOSIS — F1210 Cannabis abuse, uncomplicated: Secondary | ICD-10-CM | POA: Diagnosis present

## 2014-08-15 DIAGNOSIS — I129 Hypertensive chronic kidney disease with stage 1 through stage 4 chronic kidney disease, or unspecified chronic kidney disease: Secondary | ICD-10-CM | POA: Diagnosis present

## 2014-08-15 DIAGNOSIS — E876 Hypokalemia: Secondary | ICD-10-CM | POA: Diagnosis present

## 2014-08-15 LAB — AMMONIA: AMMONIA: 41 umol/L (ref 11–60)

## 2014-08-15 LAB — COMPREHENSIVE METABOLIC PANEL
ALBUMIN: 1.8 g/dL — AB (ref 3.5–5.2)
ALT: 9 U/L (ref 0–53)
AST: 28 U/L (ref 0–37)
Alkaline Phosphatase: 90 U/L (ref 39–117)
Anion gap: 16 — ABNORMAL HIGH (ref 5–15)
BILIRUBIN TOTAL: 0.7 mg/dL (ref 0.3–1.2)
BUN: 11 mg/dL (ref 6–23)
CALCIUM: 8.2 mg/dL — AB (ref 8.4–10.5)
CHLORIDE: 112 meq/L (ref 96–112)
CO2: 16 mEq/L — ABNORMAL LOW (ref 19–32)
Creatinine, Ser: 1.14 mg/dL (ref 0.50–1.35)
GFR calc Af Amer: 83 mL/min — ABNORMAL LOW (ref >90)
GFR calc non Af Amer: 72 mL/min — ABNORMAL LOW (ref >90)
Glucose, Bld: 85 mg/dL (ref 70–99)
Potassium: 3.1 mEq/L — ABNORMAL LOW (ref 3.7–5.3)
Sodium: 144 mEq/L (ref 137–147)
Total Protein: 6.8 g/dL (ref 6.0–8.3)

## 2014-08-15 LAB — URINALYSIS, ROUTINE W REFLEX MICROSCOPIC
Bilirubin Urine: NEGATIVE
GLUCOSE, UA: NEGATIVE mg/dL
Ketones, ur: NEGATIVE mg/dL
LEUKOCYTES UA: NEGATIVE
Nitrite: NEGATIVE
Protein, ur: >300 mg/dL — AB
SPECIFIC GRAVITY, URINE: 1.009 (ref 1.005–1.030)
Urobilinogen, UA: 0.2 mg/dL (ref 0.0–1.0)
pH: 6 (ref 5.0–8.0)

## 2014-08-15 LAB — CBC
HCT: 25.9 % — ABNORMAL LOW (ref 39.0–52.0)
Hemoglobin: 9 g/dL — ABNORMAL LOW (ref 13.0–17.0)
MCH: 28.7 pg (ref 26.0–34.0)
MCHC: 34.7 g/dL (ref 30.0–36.0)
MCV: 82.5 fL (ref 78.0–100.0)
Platelets: 104 10*3/uL — ABNORMAL LOW (ref 150–400)
RBC: 3.14 MIL/uL — ABNORMAL LOW (ref 4.22–5.81)
RDW: 16.1 % — ABNORMAL HIGH (ref 11.5–15.5)
WBC: 6.9 10*3/uL (ref 4.0–10.5)

## 2014-08-15 LAB — RAPID URINE DRUG SCREEN, HOSP PERFORMED
Amphetamines: NOT DETECTED
Barbiturates: NOT DETECTED
Benzodiazepines: NOT DETECTED
COCAINE: NOT DETECTED
OPIATES: NOT DETECTED
Tetrahydrocannabinol: NOT DETECTED

## 2014-08-15 LAB — CBG MONITORING, ED: GLUCOSE-CAPILLARY: 91 mg/dL (ref 70–99)

## 2014-08-15 LAB — I-STAT TROPONIN, ED: Troponin i, poc: 0.05 ng/mL (ref 0.00–0.08)

## 2014-08-15 LAB — URINE MICROSCOPIC-ADD ON

## 2014-08-15 LAB — I-STAT CG4 LACTIC ACID, ED: LACTIC ACID, VENOUS: 2 mmol/L (ref 0.5–2.2)

## 2014-08-15 LAB — ETHANOL: ALCOHOL ETHYL (B): 54 mg/dL — AB (ref 0–11)

## 2014-08-15 LAB — LIPASE, BLOOD: Lipase: 80 U/L — ABNORMAL HIGH (ref 11–59)

## 2014-08-15 MED ORDER — THIAMINE HCL 100 MG/ML IJ SOLN: 100.0000 mg | Freq: Every day | INTRAMUSCULAR | Status: DC

## 2014-08-15 MED ORDER — SODIUM CHLORIDE 0.9 % IV BOLUS (SEPSIS)
1000.0000 mL | Freq: Once | INTRAVENOUS | Status: AC
  Administered 2014-08-15: 1000 mL via INTRAVENOUS

## 2014-08-15 MED ORDER — NALOXONE HCL 0.4 MG/ML IJ SOLN
0.4000 mg | Freq: Once | INTRAMUSCULAR | Status: AC
  Administered 2014-08-15: 0.4 mg via INTRAVENOUS
  Filled 2014-08-15: qty 1

## 2014-08-15 MED ORDER — SODIUM CHLORIDE 0.9 % IV SOLN
INTRAVENOUS | Status: DC
  Administered 2014-08-16: 03:00:00 via INTRAVENOUS

## 2014-08-15 NOTE — ED Notes (Signed)
Pt coming from home with c/o vomiting blood, altered mental status. Family states he was brought home by a friend lethargic and vomiting blood per the friend. Pt has pinpoint pupils. CBG 80, pt has been incontinent to urine, narcan was not given by EMS. Hx of ETOH abuse and narcotic abuse.
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
## 2014-08-15 NOTE — ED Notes (Signed)
Pt admits to ETOH tonight. Unknown amount

## 2014-08-16 ENCOUNTER — Encounter (HOSPITAL_COMMUNITY): Payer: Self-pay

## 2014-08-16 DIAGNOSIS — E876 Hypokalemia: Secondary | ICD-10-CM | POA: Diagnosis present

## 2014-08-16 DIAGNOSIS — R4182 Altered mental status, unspecified: Secondary | ICD-10-CM | POA: Diagnosis not present

## 2014-08-16 DIAGNOSIS — I129 Hypertensive chronic kidney disease with stage 1 through stage 4 chronic kidney disease, or unspecified chronic kidney disease: Secondary | ICD-10-CM | POA: Diagnosis present

## 2014-08-16 DIAGNOSIS — F10229 Alcohol dependence with intoxication, unspecified: Secondary | ICD-10-CM | POA: Diagnosis not present

## 2014-08-16 DIAGNOSIS — G928 Other toxic encephalopathy: Secondary | ICD-10-CM | POA: Diagnosis present

## 2014-08-16 DIAGNOSIS — F17200 Nicotine dependence, unspecified, uncomplicated: Secondary | ICD-10-CM | POA: Diagnosis present

## 2014-08-16 DIAGNOSIS — G92 Toxic encephalopathy: Secondary | ICD-10-CM

## 2014-08-16 DIAGNOSIS — F1010 Alcohol abuse, uncomplicated: Secondary | ICD-10-CM

## 2014-08-16 DIAGNOSIS — I509 Heart failure, unspecified: Secondary | ICD-10-CM | POA: Diagnosis present

## 2014-08-16 DIAGNOSIS — F1410 Cocaine abuse, uncomplicated: Secondary | ICD-10-CM | POA: Diagnosis present

## 2014-08-16 DIAGNOSIS — F1210 Cannabis abuse, uncomplicated: Secondary | ICD-10-CM | POA: Diagnosis present

## 2014-08-16 DIAGNOSIS — I8510 Secondary esophageal varices without bleeding: Secondary | ICD-10-CM | POA: Diagnosis present

## 2014-08-16 DIAGNOSIS — Z79899 Other long term (current) drug therapy: Secondary | ICD-10-CM | POA: Diagnosis not present

## 2014-08-16 DIAGNOSIS — G929 Unspecified toxic encephalopathy: Secondary | ICD-10-CM

## 2014-08-16 DIAGNOSIS — D6959 Other secondary thrombocytopenia: Secondary | ICD-10-CM | POA: Diagnosis present

## 2014-08-16 DIAGNOSIS — K7030 Alcoholic cirrhosis of liver without ascites: Secondary | ICD-10-CM | POA: Diagnosis present

## 2014-08-16 DIAGNOSIS — I5032 Chronic diastolic (congestive) heart failure: Secondary | ICD-10-CM | POA: Diagnosis present

## 2014-08-16 DIAGNOSIS — D689 Coagulation defect, unspecified: Secondary | ICD-10-CM | POA: Diagnosis present

## 2014-08-16 DIAGNOSIS — N183 Chronic kidney disease, stage 3 unspecified: Secondary | ICD-10-CM | POA: Diagnosis present

## 2014-08-16 LAB — BASIC METABOLIC PANEL
ANION GAP: 11 (ref 5–15)
BUN: 11 mg/dL (ref 6–23)
CHLORIDE: 115 meq/L — AB (ref 96–112)
CO2: 20 mEq/L (ref 19–32)
Calcium: 8.3 mg/dL — ABNORMAL LOW (ref 8.4–10.5)
Creatinine, Ser: 1.04 mg/dL (ref 0.50–1.35)
GFR, EST NON AFRICAN AMERICAN: 80 mL/min — AB (ref >90)
Glucose, Bld: 100 mg/dL — ABNORMAL HIGH (ref 70–99)
POTASSIUM: 3.1 meq/L — AB (ref 3.7–5.3)
SODIUM: 146 meq/L (ref 137–147)

## 2014-08-16 LAB — CBC
HCT: 27.7 % — ABNORMAL LOW (ref 39.0–52.0)
Hemoglobin: 9.5 g/dL — ABNORMAL LOW (ref 13.0–17.0)
MCH: 28.5 pg (ref 26.0–34.0)
MCHC: 34.3 g/dL (ref 30.0–36.0)
MCV: 83.2 fL (ref 78.0–100.0)
Platelets: 96 10*3/uL — ABNORMAL LOW (ref 150–400)
RBC: 3.33 MIL/uL — ABNORMAL LOW (ref 4.22–5.81)
RDW: 16.4 % — AB (ref 11.5–15.5)
WBC: 5.5 10*3/uL (ref 4.0–10.5)

## 2014-08-16 LAB — I-STAT ARTERIAL BLOOD GAS, ED
ACID-BASE DEFICIT: 3 mmol/L — AB (ref 0.0–2.0)
BICARBONATE: 20.6 meq/L (ref 20.0–24.0)
O2 SAT: 97 %
Patient temperature: 98.7
TCO2: 21 mmol/L (ref 0–100)
pCO2 arterial: 30.6 mmHg — ABNORMAL LOW (ref 35.0–45.0)
pH, Arterial: 7.436 (ref 7.350–7.450)
pO2, Arterial: 82 mmHg (ref 80.0–100.0)

## 2014-08-16 LAB — SALICYLATE LEVEL

## 2014-08-16 LAB — OSMOLALITY: Osmolality: 300 mOsm/kg (ref 275–300)

## 2014-08-16 LAB — CBG MONITORING, ED: GLUCOSE-CAPILLARY: 98 mg/dL (ref 70–99)

## 2014-08-16 MED ORDER — CITALOPRAM HYDROBROMIDE 20 MG PO TABS
20.0000 mg | ORAL_TABLET | Freq: Every day | ORAL | Status: DC
  Administered 2014-08-16: 20 mg via ORAL
  Filled 2014-08-16: qty 1

## 2014-08-16 MED ORDER — FOLIC ACID 1 MG PO TABS
1.0000 mg | ORAL_TABLET | Freq: Every day | ORAL | Status: DC
  Administered 2014-08-16: 1 mg via ORAL
  Filled 2014-08-16: qty 1

## 2014-08-16 MED ORDER — FERROUS SULFATE 325 (65 FE) MG PO TABS
325.0000 mg | ORAL_TABLET | Freq: Three times a day (TID) | ORAL | Status: DC
  Administered 2014-08-16: 325 mg via ORAL
  Filled 2014-08-16 (×4): qty 1

## 2014-08-16 MED ORDER — LABETALOL HCL 5 MG/ML IV SOLN: 5.0000 mg | INTRAVENOUS | Status: DC | PRN

## 2014-08-16 MED ORDER — HEPARIN SODIUM (PORCINE) 5000 UNIT/ML IJ SOLN
5000.0000 [IU] | Freq: Three times a day (TID) | INTRAMUSCULAR | Status: DC
  Filled 2014-08-16 (×4): qty 1

## 2014-08-16 MED ORDER — PANTOPRAZOLE SODIUM 40 MG PO TBEC
40.0000 mg | DELAYED_RELEASE_TABLET | Freq: Every day | ORAL | Status: DC
  Administered 2014-08-16: 40 mg via ORAL
  Filled 2014-08-16: qty 1

## 2014-08-16 MED ORDER — POTASSIUM CHLORIDE CRYS ER 20 MEQ PO TBCR
40.0000 meq | EXTENDED_RELEASE_TABLET | Freq: Once | ORAL | Status: AC
  Administered 2014-08-16: 40 meq via ORAL
  Filled 2014-08-16: qty 2

## 2014-08-16 MED ORDER — CARVEDILOL 6.25 MG PO TABS
6.2500 mg | ORAL_TABLET | Freq: Two times a day (BID) | ORAL | Status: DC
  Administered 2014-08-16: 6.25 mg via ORAL
  Filled 2014-08-16 (×3): qty 1

## 2014-08-16 MED ORDER — THIAMINE HCL 100 MG/ML IJ SOLN
100.0000 mg | Freq: Once | INTRAMUSCULAR | Status: AC
  Administered 2014-08-16: 100 mg via INTRAVENOUS
  Filled 2014-08-16: qty 2

## 2014-08-16 NOTE — Progress Notes (Signed)
Utilization Review Completed.  

## 2014-08-16 NOTE — Progress Notes (Signed)
Pt ambulated in hallway hand held assist with staff, tolerated well, steady. Pt states he uses a cane at home. Pt to go home with family.

## 2014-08-16 NOTE — Progress Notes (Signed)
D/c instructions reviewed with pt and his mother present in room. Copy of instructions given to pt. Pt d/c'd via wheelchair with belongings, with family, escorted by unit NT.

## 2014-08-16 NOTE — Discharge Instructions (Signed)
DO NOT DRINK ANY ALCOHOL - NO BEER, NO WINE, NO LIQUOR OF ANY KIND  Cirrhosis Cirrhosis is a condition of scarring of the liver which is caused when the liver has tried repairing itself following damage. This damage may come from a previous infection such as one of the forms of hepatitis (usually hepatitis C), or the damage may come from being injured by toxins. The main toxin that causes this damage is alcohol. The scarring of the liver from use of alcohol is irreversible. That means the liver cannot return to normal even though alcohol is not used any more. The main danger of hepatitis C infection is that it may cause long-lasting (chronic) liver disease, and this also may lead to cirrhosis. This complication is progressive and irreversible. CAUSES  Prior to available blood tests, hepatitis C could be contracted by blood transfusions. Since testing of blood has improved, this is now unlikely. This infection can also be contracted through intravenous drug use and the sharing of needles. It can also be contracted through sexual relationships. The injury caused by alcohol comes from too much use. It is not a few drinks that poison the liver, but years of misuse. Usually there will be some signs and symptoms early with scarring of the liver that suggest the development of better habits. Alcohol should never be used while using acetaminophen. A small dose of both taken together may cause irreversible damage to the liver. HOME CARE INSTRUCTIONS  There is no specific treatment for cirrhosis. However, there are things you can do to avoid making the condition worse.  Rest as needed.  Eat a well-balanced diet. Your caregiver can help you with suggestions.  Vitamin supplements including vitamins A, K, D, and thiamine can help.  A low-salt diet, water restriction, or diuretic medicine may be needed to reduce fluid retention.  Avoid alcohol. This can be extremely toxic if combined with acetaminophen.  Avoid  drugs which are toxic to the liver. Some of these include isoniazid, methyldopa, acetaminophen, anabolic steroids (muscle-building drugs), erythromycin, and oral contraceptives (birth control pills). Check with your caregiver to make sure medicines you are presently taking will not be harmful.  Periodic blood tests may be required. Follow your caregiver's advice regarding the timing of these.  Milk thistle is an herbal remedy which does protect the liver against toxins. However, it will not help once the liver has been scarred. SEEK MEDICAL CARE IF:  You have increasing fatigue or weakness.  You develop swelling of the hands, feet, legs, or face.  You vomit bright red blood, or a coffee ground appearing material.  You have blood in your stools, or the stools turn black and tarry.  You have a fever.  You develop loss of appetite, or have nausea and vomiting.  You develop jaundice.  You develop easy bruising or bleeding.  You have worsening of any of the problems you are concerned about. Document Released: 11/26/2005 Document Revised: 02/18/2012 Document Reviewed: 07/14/2008 [facility] Patient Information 2015 [place], [place]. This information is not intended to replace advice given to you by your health care provider. Make sure you discuss any questions you have with your health care provider.
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
## 2014-08-16 NOTE — Progress Notes (Signed)
CSW received call from [facility] re: patient who is being d/c'd home.  Patient has agreed with MD to receive substance abuse information prior to d/c.  CSW met with patient- he stated that he has attended [place] meetings in the past and that they were helpful.  Patient stated that he might consider starting back to [place] meetings and accepted the current weekly schedule for AA as well as a list of SA treatment facilities.  Patient states that he has family support and denies any other concerns; he was anxious to leave and did not wish to meet further with CSW.  CSW signing off.  Lorie Phenix. Pauline Good, [place]

## 2014-08-18 ENCOUNTER — Other Ambulatory Visit: Payer: Medicaid Other

## 2014-08-20 ENCOUNTER — Encounter: Payer: Medicaid Other | Attending: Physical Medicine & Rehabilitation

## 2014-08-20 ENCOUNTER — Inpatient Hospital Stay: Payer: Medicaid Other | Admitting: Physical Medicine & Rehabilitation

## 2014-08-25 ENCOUNTER — Inpatient Hospital Stay
Admission: RE | Admit: 2014-08-25 | Discharge: 2014-08-25 | Disposition: A | Discharge status: Home / Self Care | Payer: Medicaid Other | Source: Ambulatory Visit | Attending: Radiology | Admitting: Radiology

## 2014-08-25 ENCOUNTER — Inpatient Hospital Stay: Admission: RE | Admit: 2014-08-25 | Payer: Medicaid Other | Source: Ambulatory Visit

## 2014-08-26 ENCOUNTER — Encounter (INDEPENDENT_AMBULATORY_CARE_PROVIDER_SITE_OTHER): Payer: Self-pay | Admitting: General Surgery

## 2014-09-14 ENCOUNTER — Ambulatory Visit: Payer: Medicaid Other | Admitting: Gastroenterology

## 2014-09-15 ENCOUNTER — Ambulatory Visit
Admission: RE | Admit: 2014-09-15 | Discharge: 2014-09-15 | Disposition: A | Discharge status: Home / Self Care | Payer: Medicaid Other | Source: Ambulatory Visit | Attending: Radiology | Admitting: Radiology

## 2014-09-15 ENCOUNTER — Ambulatory Visit
Admission: RE | Admit: 2014-09-15 | Discharge: 2014-09-15 | Disposition: A | Discharge status: Home / Self Care | Payer: Medicaid Other | Source: Ambulatory Visit | Attending: Interventional Radiology | Admitting: Interventional Radiology

## 2014-09-15 DIAGNOSIS — K922 Gastrointestinal hemorrhage, unspecified: Secondary | ICD-10-CM

## 2014-09-15 DIAGNOSIS — K7031 Alcoholic cirrhosis of liver with ascites: Secondary | ICD-10-CM

## 2014-09-15 DIAGNOSIS — K649 Unspecified hemorrhoids: Secondary | ICD-10-CM

## 2014-09-15 NOTE — Progress Notes (Signed)
Chief Complaint: Chief Complaint  Patient presents with  . Follow-up    s/p TIPS    Referring Physician(s): Bruning,Kevin  History of Present Illness: Edwin Love is a 53 y.o. male with secondary (EtOH and HCV) cirrhosis complicated by life-threatening lower GI bleed from massive internal hemorrhoids fed by porto-systemic collaterals requiring emergency surgery and TIPS creation.  TIPS was performed on 07/02/14.    During hospital admission, patient noted to have new liver lesions concerning for [facility].  AFP is not elevated.  Subsequent MRI 07/29/14 shows 3 lesions, one of which does not enhance and is likely a regenerating nodule.  A second adjacent < 2cm lesion shows indeterminate characteristics and warrants follow up.   Currently, Edwin Love is doing well and denies abdominal pain, confusion, hematemesis, melena or BRBPR.  He complains of intermittent decreased appetite and occasional forgetfulness. Also suffers from chronic SOB with activity.   Past Medical History  Diagnosis Date  . Hypertension   . Back pain   . Renal disorder     CKD stage 3 noted in 02/2014 H & P  . Anemia 01/2014  . LVH (left ventricular hypertrophy) 02/2014    grade 2 diastolic dysfunction.   . Ankle fracture, left 10/2008    non surgical mgt.   . Thrombocytopenia 06/2014  . Coagulopathy 06/2014  . Esophageal varices   . GI bleed   . Cirrhosis     Past Surgical History  Procedure Laterality Date  . Eye surgery Left 1990's    Foreign body  . Inguinal hernia repair Right 02/08/2014    Procedure: HERNIA REPAIR INGUINAL INCARCERATED;  Surgeon: Edward Jolly, MD;  Location: [place];  Service: General;  Laterality: Right;  . Insertion of mesh Right 02/08/2014    Procedure: INSERTION OF MESH;  Surgeon: Edward Jolly, MD;  Location: [place];  Service: General;  Laterality: Right;  . Hernia repair    . Esophagogastroduodenoscopy N/A 06/24/2014    Procedure: ESOPHAGOGASTRODUODENOSCOPY (EGD);   Surgeon: Inda Castle, MD;  Location: [place];  Service: Endoscopy;  Laterality: N/A;  . Colonoscopy N/A 06/25/2014    Procedure: COLONOSCOPY;  Surgeon: Inda Castle, MD;  Location: [place];  Service: Endoscopy;  Laterality: N/A;  . Hemorrhoid surgery N/A 06/25/2014    Procedure: oversew of rectal varices;  Surgeon: Harl Bowie, MD;  Location: [place];  Service: General;  Laterality: N/A;  . Radiology with anesthesia N/A 07/02/2014    Procedure: RADIOLOGY WITH ANESTHESIA;  Surgeon: Jacqulynn Cadet, MD;  Location: [place];  Service: Radiology;  Laterality: N/A;    Allergies: Review of patient's allergies indicates no known allergies.  Medications: Prior to Admission medications   Medication Sig Start Date End Date Taking? Authorizing Provider  carvedilol (COREG) 6.25 MG tablet Take 6.25 mg by mouth 2 (two) times daily with a meal.    Historical Provider, MD  citalopram (CELEXA) 20 MG tablet Take 20 mg by mouth daily.    Historical Provider, MD  ferrous sulfate 325 (65 FE) MG tablet Take 325 mg by mouth 3 (three) times daily with meals.    Historical Provider, MD  folic acid (FOLVITE) 1 MG tablet Take 1 mg by mouth daily.    Historical Provider, MD  pantoprazole (PROTONIX) 40 MG tablet Take 40 mg by mouth daily.    Historical Provider, MD    Family History  Problem Relation Age of Onset  . Diabetes Brother     Mother too  .  Hypertension      family  . Sickle cell anemia Sister     History   Social History  . Marital Status: Single    Spouse Name: N/A    Number of Children: N/A  . Years of Education: N/A   Social History Main Topics  . Smoking status: Current Every Day Smoker -- 0.50 packs/day for 34 years    Types: Cigarettes  . Smokeless tobacco: Never Used     Comment: Smoke since 53 years old  . Alcohol Use: Yes     Comment: 40 oz every 2-3 days  . Drug Use: Yes    Special: Marijuana, Cocaine     Comment: Cocaine 15 years ago, Marijuana 4 mo ago  .  Sexual Activity: Not on file   Other Topics Concern  . Not on file   Social History Narrative  . No narrative on file     Review of Systems: A 12 point ROS discussed and pertinent positives are indicated in the HPI above.  All other systems are negative.  Review of Systems  Vital Signs: BP 163/89  Pulse 84  Temp(Src) 98.5 F (36.9 C) (Oral)  Resp 14  Ht 5\' 9"  (1.753 m)  Wt 212 lb (96.163 kg)  BMI 31.29 kg/m2  SpO2 99%  Physical Exam  Constitutional: He is oriented to person, place, and time. He appears well-developed and well-nourished. No distress.  HENT:  Head: Normocephalic and atraumatic.  Eyes: No scleral icterus.  Cardiovascular: Normal rate and regular rhythm.   Pulmonary/Chest: Effort normal.  Abdominal: Soft. He exhibits no distension.  Neurological: He is alert and oriented to person, place, and time.  neg for asterixis  Skin: Skin is warm and dry.  Psychiatric: He has a normal mood and affect.    Imaging: [place] Art/ven Flow Abd Pelv Doppler  09/15/2014   CLINICAL DATA:  53 year old male with alcoholic and HCV cirrhosis complicated by life-threatening lower GI bleed from rectal varices fed by portosystemic collaterals. Patient underwent creation of a TIPS on 07/02/2014 and presents today for his first scheduled followup evaluation.  EXAM: DUPLEX ULTRASOUND OF LIVER AND TIPS SHUNT  TECHNIQUE: Color and duplex Doppler ultrasound was performed to evaluate the hepatic in-flow and out-flow vessels.  COMPARISON:  TIPS creation images 7 03/29/2014; MRI abdomen 07/29/2014  FINDINGS: TIPS Shunt Velocities  Proximal:  138 cm/sec  Mid:  132  Distal:  132 cm/sec  Portal Vein Velocities  Main:  71 Cm/sec  Right:  100 cm/sec  Left:  31 cm/sec  Hepatic Vein Velocities  Right:  47 cm/sec  Middle:  96 cm/sec  Left:  19 cm/sec  Hepatic Artery Velocity:  182 cm/sec  Splenic Vein Velocity:  43 cm/sec  Varices:  None visible Ascites: None  Other findings: None  IMPRESSION: Widely patent  middle hepatic to right portal vein TIPS shunt. No evidence of stenosis.  Signed,  Criselda Peaches, MD  [facility]  [facility]   Electronically Signed   By: Jacqulynn Cadet M.D.   On: 09/15/2014 17:08    Labs:  CBC:  Recent Labs  07/07/14 0940 07/20/14 1445 08/15/14 2045 08/16/14 0450  WBC 10.7* 7.7 6.9 5.5  HGB 8.2* 8.8 Repeated and verified X2.* 9.0* 9.5*  HCT 25.1* 27.0* 25.9* 27.7*  PLT 158 157.0 104* 96*    COAGS:  Recent Labs  06/25/14 0300  06/25/14 2130  06/28/14 0500  06/29/14 0410 07/02/14 0010 07/03/14 0255 07/20/14 1445  INR 2.33*  < > 2.35*  < > 1.62*  < > 1.55* 1.43 1.42 1.3*  APTT 36  --  46*  --  32  --   --  36  --   --   < > = values in this interval not displayed.  BMP:  Recent Labs  07/04/14 0318 07/06/14 0630 07/20/14 1445 08/15/14 2045 08/16/14 0450  NA 143 140 139 144 146  K 3.5* 3.9 3.7 3.1* 3.1*  CL 105 106 113* 112 115*  CO2 25 22 18* 16* 20  GLUCOSE 117* 89 87 85 100*  BUN 28* 20 12 11 11   CALCIUM 7.4* 7.2* 8.4 8.2* 8.3*  CREATININE 1.65* 1.40* 1.4 1.14 1.04  GFRNONAA 46* 56*  --  72* 80*  GFRAA 53* 65*  --  83* >90    LIVER FUNCTION TESTS:  Recent Labs  07/04/14 0318 07/06/14 0630 07/20/14 1445 08/15/14 2045  BILITOT 1.9* 2.2* 1.2 0.7  AST 62* 41* 24 28  ALT 36 25 12 9   ALKPHOS 103 116 141* 90  PROT 4.8* 5.3* 7.1 6.8  ALBUMIN 1.6* 1.7* 2.0* 1.8*    TUMOR MARKERS:  Recent Labs  06/24/14 1340 07/20/14 1445  AFPTM 2.8 3.6    Assessment and Plan:  TIPS - doing well, no encephalopathy or evidence of liver decompensation - stent widely patent on [place] today, no evidence of recurrent portal vein thrombosis - Surveillance duplex US in 3 mo and return clinic visit  Hepatic lesions -  MRI 07/29/14 shows 3 lesions, one of which does not enhance and is likely a regenerating nodule.  A second adjacent < 2cm lesion shows indeterminate characteristics and warrants  follow up. Third lesion not well seen.  Lesions appear slightly smaller comparing across modalities to the prior CT. - AFP not particularly elevated  - Repeat MRI with Eovist in 3 months (from 8/20, so end of Nov) to assess for growth.  If enlarging but not definitive by imaging for [facility], biopsy may become required   Signed,  Criselda Peaches, MD    I spent a total of 30 minutes face to face in clinical consultation, greater than 50% of which was counseling/coordinating care for secondary (EtOH and HCV) cirrhosis post TIPS with indeterminate liver lesions.  SignedLaurence Ferrari, [place] 09/15/2014, 6:07 PM

## 2014-09-16 ENCOUNTER — Telehealth: Payer: Self-pay

## 2014-09-16 ENCOUNTER — Other Ambulatory Visit: Payer: Self-pay | Admitting: Gastroenterology

## 2014-09-16 ENCOUNTER — Other Ambulatory Visit (HOSPITAL_COMMUNITY): Payer: Self-pay | Admitting: Interventional Radiology

## 2014-09-16 DIAGNOSIS — Z95828 Presence of other vascular implants and grafts: Secondary | ICD-10-CM

## 2014-09-16 DIAGNOSIS — R160 Hepatomegaly, not elsewhere classified: Secondary | ICD-10-CM

## 2014-09-16 NOTE — Telephone Encounter (Signed)
Spoke with the mother and the patient. Appointment rescheduled.

## 2014-09-16 NOTE — Telephone Encounter (Signed)
Message copied by [name] on Thu Sep 16, 2014  9:04 AM ------      Message from: Edwin Love      Created: Wed Sep 15, 2014  4:36 PM       Please contact patient for an office visit sometime in the next few weeks ------

## 2014-09-27 ENCOUNTER — Encounter: Payer: Self-pay | Admitting: Gastroenterology

## 2014-09-27 ENCOUNTER — Ambulatory Visit (INDEPENDENT_AMBULATORY_CARE_PROVIDER_SITE_OTHER): Payer: Medicaid Other | Admitting: Gastroenterology

## 2014-09-27 VITALS — BP 166/100 | HR 98 | Ht 69.0 in | Wt 209.0 lb

## 2014-09-27 DIAGNOSIS — B182 Chronic viral hepatitis C: Secondary | ICD-10-CM

## 2014-09-27 DIAGNOSIS — K7031 Alcoholic cirrhosis of liver with ascites: Secondary | ICD-10-CM

## 2014-09-27 DIAGNOSIS — B1920 Unspecified viral hepatitis C without hepatic coma: Secondary | ICD-10-CM | POA: Insufficient documentation

## 2014-09-27 DIAGNOSIS — K439 Ventral hernia without obstruction or gangrene: Secondary | ICD-10-CM | POA: Insufficient documentation

## 2014-09-27 NOTE — Assessment & Plan Note (Signed)
Patient is not a candidate for medical therapy

## 2014-09-27 NOTE — Progress Notes (Signed)
      History of Present Illness:  Edwin Love has returned for followup of cirrhosis.  He continues to drink alcohol although he claims he has lessened the amount.  His main complaint is fatigue.  He has a midline hernia but it is not causing symptoms.    Review of Systems: Pertinent positive and negative review of systems were noted in the above HPI section. All other review of systems were otherwise negative.    Current Medications, Allergies, Past Medical History, Past Surgical History, Family History and Social History were reviewed in [facility] record  Vital signs were reviewed in today's medical record. Physical Exam: General: Mildly obese male in no acute distress Skin: anicteric Head: Normocephalic and atraumatic Eyes:  sclerae anicteric, EOMI Ears: Normal auditory acuity Mouth: No deformity or lesions Lungs: Clear throughout to auscultation Heart: Regular rate and rhythm; no murmurs, rubs or bruits Abdomen: Soft, non tender and non distended. No masses, hepatosplenomegaly  noted. Normal Bowel sounds.  There is a small midline hernia Rectal:deferred Musculoskeletal: Symmetrical with no gross deformities  Pulses:  Normal pulses noted Extremities: No clubbing, cyanosis, edema or deformities noted Neurological: Alert oriented x 4, grossly nonfocal Psychological:  Alert and cooperative. Normal mood and affect  See Assessment and Plan under Problem List

## 2014-09-27 NOTE — Patient Instructions (Signed)
Follow up as needed

## 2014-09-27 NOTE — Assessment & Plan Note (Addendum)
Patient is asymptomatic.  He is a poor surgical candidate.  Will continue observation only

## 2014-09-27 NOTE — Assessment & Plan Note (Signed)
Unfortunately patient continues to drink.  He's had no acute problems since TIPS procedure.  Under the circumstances I think we have very little else to offer him.  He'll return as needed.

## 2014-09-28 ENCOUNTER — Other Ambulatory Visit (HOSPITAL_COMMUNITY): Payer: Self-pay | Admitting: Interventional Radiology

## 2014-09-28 DIAGNOSIS — R160 Hepatomegaly, not elsewhere classified: Secondary | ICD-10-CM

## 2014-09-28 DIAGNOSIS — Z95828 Presence of other vascular implants and grafts: Secondary | ICD-10-CM

## 2014-10-26 ENCOUNTER — Encounter: Payer: Self-pay | Admitting: Radiology

## 2014-10-26 ENCOUNTER — Other Ambulatory Visit: Payer: Self-pay | Admitting: Radiology

## 2014-10-26 DIAGNOSIS — Z95828 Presence of other vascular implants and grafts: Secondary | ICD-10-CM

## 2014-10-26 DIAGNOSIS — R160 Hepatomegaly, not elsewhere classified: Secondary | ICD-10-CM

## 2014-11-09 LAB — COMPREHENSIVE METABOLIC PANEL
ALK PHOS: 150 U/L — AB (ref 39–117)
ALT: 25 U/L (ref 0–53)
AST: 79 U/L — ABNORMAL HIGH (ref 0–37)
Albumin: 2.7 g/dL — ABNORMAL LOW (ref 3.5–5.2)
BUN: 18 mg/dL (ref 6–23)
CO2: 21 mEq/L (ref 19–32)
Calcium: 8.8 mg/dL (ref 8.4–10.5)
Chloride: 115 mEq/L — ABNORMAL HIGH (ref 96–112)
Creat: 1.42 mg/dL — ABNORMAL HIGH (ref 0.50–1.35)
Glucose, Bld: 104 mg/dL — ABNORMAL HIGH (ref 70–99)
POTASSIUM: 4.6 meq/L (ref 3.5–5.3)
SODIUM: 142 meq/L (ref 135–145)
Total Bilirubin: 0.5 mg/dL (ref 0.2–1.2)
Total Protein: 7.5 g/dL (ref 6.0–8.3)

## 2014-11-11 ENCOUNTER — Ambulatory Visit: Payer: Medicaid Other

## 2014-11-11 ENCOUNTER — Ambulatory Visit (HOSPITAL_COMMUNITY)
Admission: RE | Admit: 2014-11-11 | Discharge: 2014-11-11 | Disposition: A | Discharge status: Home / Self Care | Payer: Medicaid Other | Source: Ambulatory Visit | Attending: Interventional Radiology | Admitting: Interventional Radiology

## 2014-11-11 DIAGNOSIS — R160 Hepatomegaly, not elsewhere classified: Secondary | ICD-10-CM | POA: Insufficient documentation

## 2014-11-11 DIAGNOSIS — K739 Chronic hepatitis, unspecified: Secondary | ICD-10-CM | POA: Diagnosis not present

## 2014-11-11 DIAGNOSIS — K7460 Unspecified cirrhosis of liver: Secondary | ICD-10-CM | POA: Diagnosis not present

## 2014-11-11 DIAGNOSIS — Z95828 Presence of other vascular implants and grafts: Secondary | ICD-10-CM

## 2014-11-11 MED ORDER — GADOXETATE DISODIUM 0.25 MMOL/ML IV SOLN: 10.0000 mL | Freq: Once | INTRAVENOUS | Status: AC | PRN

## 2014-11-25 ENCOUNTER — Ambulatory Visit
Admission: RE | Admit: 2014-11-25 | Discharge: 2014-11-25 | Disposition: A | Discharge status: Home / Self Care | Payer: Medicaid Other | Source: Ambulatory Visit | Attending: Interventional Radiology | Admitting: Interventional Radiology

## 2014-11-25 ENCOUNTER — Other Ambulatory Visit: Payer: Self-pay | Admitting: Emergency Medicine

## 2014-11-25 DIAGNOSIS — Z95828 Presence of other vascular implants and grafts: Secondary | ICD-10-CM

## 2014-11-25 DIAGNOSIS — R160 Hepatomegaly, not elsewhere classified: Secondary | ICD-10-CM

## 2014-11-25 DIAGNOSIS — K7030 Alcoholic cirrhosis of liver without ascites: Secondary | ICD-10-CM

## 2014-11-25 NOTE — Progress Notes (Signed)
Appetite:  Fair.  Denies nausea, vomiting or diarrhea.  Occasional right sided discomfort.  Does not require pain meds.  Sleeping:  Fair.

## 2014-11-25 NOTE — Progress Notes (Signed)
Chief Complaint: Cirrhosis, bleeding rectal varices  Referring Physician(s): McCullough,Heath  History of Present Illness: Edwin Love is a 53 y.o. 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    During his workup for TIPS, he was noted to have indeterminate lesions in the superior aspect of the right hepatic dome. His AFP has not been particularly elevated. Further evaluation by MRI was also indeterminate. These lesions may represent small hepatocellular cancers versus degenerating/regenerating nodules.   He was scheduled to have a 3 month follow-up MRI scan and presents today to discuss those results. Medically, Edwin Love remains asymptomatic. He has had no further bright red bleeding per rectum since the time of his surgical and interventional procedures. He does continue to consume alcohol although he reports he drinks much less than previously.  He has no new symptoms.  Past Medical History  Diagnosis Date  . Hypertension   . Back pain   . Renal disorder     CKD stage 3 noted in 02/2014 [facility] & [facility]  . Anemia 01/2014  . LVH (left ventricular hypertrophy) 02/2014    grade 2 diastolic dysfunction.   . Ankle fracture, left 10/2008    non surgical mgt.   . Thrombocytopenia 06/2014  . Coagulopathy 06/2014  . Esophageal varices   . GI bleed   . Cirrhosis     Past Surgical History  Procedure Laterality Date  . Eye surgery Left 1990's    Foreign body  . Inguinal hernia repair Right 02/08/2014    Procedure: HERNIA REPAIR INGUINAL INCARCERATED;  Surgeon: Edward Jolly, MD;  Location: [place];  Service: General;  Laterality: Right;  . Insertion of mesh Right 02/08/2014    Procedure: INSERTION OF MESH;  Surgeon: Edward Jolly, MD;  Location: [place];  Service: General;   Laterality: Right;  . Hernia repair    . Esophagogastroduodenoscopy N/A 06/24/2014    Procedure: ESOPHAGOGASTRODUODENOSCOPY (EGD);  Surgeon: Inda Castle, MD;  Location: [place];  Service: Endoscopy;  Laterality: N/A;  . Colonoscopy N/A 06/25/2014    Procedure: COLONOSCOPY;  Surgeon: Inda Castle, MD;  Location: [place];  Service: Endoscopy;  Laterality: N/A;  . Hemorrhoid surgery N/A 06/25/2014    Procedure: oversew of rectal varices;  Surgeon: Harl Bowie, MD;  Location: [place];  Service: General;  Laterality: N/A;  . Radiology with anesthesia N/A 07/02/2014    Procedure: RADIOLOGY WITH ANESTHESIA;  Surgeon: Jacqulynn Cadet, MD;  Location: [place];  Service: Radiology;  Laterality: N/A;    Allergies: Review of patient's allergies indicates no known allergies.  Medications: Prior to Admission medications   Medication Sig Start Date End Date Taking? Authorizing Provider  carvedilol (COREG) 6.25 MG tablet TAKE 1 TABLET TWICE A DAY WITH A MEAL 09/16/14   Inda Castle, MD  citalopram (CELEXA) 20 MG tablet Take 20 mg by mouth daily.    Historical Provider, MD  ferrous sulfate 325 (65 FE) MG tablet Take 325 mg by mouth 3 (three) times daily with meals.    Historical Provider, MD  folic acid (FOLVITE) 1 MG tablet Take 1 mg by mouth daily.    Historical Provider, MD  pantoprazole (PROTONIX) 40 MG tablet Take 40 mg by mouth daily.    Historical Provider, MD    Family History  Problem Relation Age  of Onset  . Diabetes Brother     Mother too  . Hypertension      family  . Sickle cell anemia Sister     History   Social History  . Marital Status: Single    Spouse Name: N/A    Number of Children: N/A  . Years of Education: N/A   Social History Main Topics  . Smoking status: Current Every Day Smoker -- 0.50 packs/day for 34 years    Types: Cigarettes  . Smokeless tobacco: Never Used     Comment: Smoke since 53 years old  . Alcohol Use: Yes     Comment: 40 oz every  2-3 days  . Drug Use: Yes     Comment: Cocaine 15 years ago, Marijuana 4 mo ago  . Sexual Activity: Not on file   Other Topics Concern  . Not on file   Social History Narrative  . No narrative on file   Review of Systems: A 12 point ROS discussed and pertinent positives are indicated in the HPI above.  All other systems are negative.  Review of Systems  Vital Signs: BP 170/103 mmHg  Pulse 90  Temp(Src) 98.3 F (36.8 C) (Oral)  Resp 14  SpO2 98%  Physical Exam  Constitutional: He is oriented to person, place, and time. He appears well-developed and well-nourished.  HENT:  Head: Normocephalic and atraumatic.  Eyes: No scleral icterus.  Cardiovascular: Normal rate and regular rhythm.   Pulmonary/Chest: Effort normal.  Abdominal: Soft. He exhibits no distension.  Neurological: He is alert and oriented to person, place, and time.  Skin: Skin is warm and dry.  Psychiatric: He has a normal mood and affect. His behavior is normal.  Nursing note and vitals reviewed.   Imaging: Mr Abdomen W Wo Contrast  11/11/2014   CLINICAL DATA:  Chronic hepatitis and cirrhosis with history of TIPS. Follow-up liver lesions. Subsequent encounter.  EXAM: MRI ABDOMEN WITHOUT AND WITH CONTRAST  TECHNIQUE: Multiplanar multisequence MR imaging of the abdomen was performed both before and after the administration of intravenous contrast.  CONTRAST:  10 ml Eovist.  COMPARISON:  Abdominal MRI and 07/29/2014.  Abdominal CT 06/24/2014.  FINDINGS: Study quality is again degraded by breathing artifact and artifact from the transjugular intrahepatic portosystemic shunt. Morphologic changes of cirrhosis are again noted. There is a small amount of perihepatic ascites. The dominant lesions involving the dome of the right hepatic lobe (segment 8) are grossly stable, although difficult to evaluate given the proximity to the diaphragm and associated motion. The lesions are best seen on the LAVA sequences and demonstrate  intrinsic T1 shortening prior to contrast administration. They measure 1.9 cm on image 19 and 1.3 cm on image 20 of series 800. The lesion anteriorly in segment 2 is best seen following contrast, measuring approximately 1.6 cm. There is a gastrohepatic ligament lymph node or exophytic hepatic lesion posteriorly in the lateral segment of the left lobe (segment 2) measuring 2.0 cm on image 41. Ghost artifact from the aorta limits assessment of this lesion. The largest 2 lesions show arterial phase enhancement.  No new lesions are identified. Flow void is present within the TIPS shunt. No tumor thrombus is identified within the hepatic or portal veins. The gallbladder and biliary system appear normal. Hepatocyte phase images demonstrate excretion into the common bile duct. The pancreas and pancreatic duct appear normal.  The spleen, adrenal glands and kidneys demonstrate no significant findings.  IMPRESSION: 1. No significant change in the  patient's liver lesions is demonstrated. The study is limited by breathing artifact and proximity of the lesions in the dome of the right lobe to the diaphragm. I believe lesions are better visualized on CT, and that should be considered for subsequent follow-up. 2. Patent TIPS without demonstrated complication. 3. No acute findings.   Electronically Signed   By: Camie Patience M.D.   On: 11/11/2014 14:47    Labs:  CBC:  Recent Labs  07/07/14 0940 07/20/14 1445 08/15/14 2045 08/16/14 0450  WBC 10.7* 7.7 6.9 5.5  HGB 8.2* 8.8 Repeated and verified X2.* 9.0* 9.5*  HCT 25.1* 27.0* 25.9* 27.7*  PLT 158 157.0 104* 96*    COAGS:  Recent Labs  06/25/14 0300  06/25/14 2130  06/28/14 0500  06/29/14 0410 07/02/14 0010 07/03/14 0255 07/20/14 1445  INR 2.33*  < > 2.35*  < > 1.62*  < > 1.55* 1.43 1.42 1.3*  APTT 36  --  46*  --  32  --   --  36  --   --   < > = values in this interval not displayed.  BMP:  Recent Labs  07/04/14 0318 07/06/14 0630  07/20/14 1445 08/15/14 2045 08/16/14 0450 11/08/14 1416  NA 143 140 139 144 146 142  K 3.5* 3.9 3.7 3.1* 3.1* 4.6  CL 105 106 113* 112 115* 115*  CO2 25 22 18* 16* 20 21  GLUCOSE 117* 89 87 85 100* 104*  BUN 28* 20 12 11 11 18   CALCIUM 7.4* 7.2* 8.4 8.2* 8.3* 8.8  CREATININE 1.65* 1.40* 1.4 1.14 1.04 1.42*  GFRNONAA 46* 56*  --  72* 80*  --   GFRAA 53* 65*  --  83* >90  --     LIVER FUNCTION TESTS:  Recent Labs  07/06/14 0630 07/20/14 1445 08/15/14 2045 11/08/14 1416  BILITOT 2.2* 1.2 0.7 0.5  AST 41* 24 28 79*  ALT 25 12 9 25   ALKPHOS 116 141* 90 150*  PROT 5.3* 7.1 6.8 7.5  ALBUMIN 1.7* 2.0* 1.8* 2.7*    TUMOR MARKERS:  Recent Labs  06/24/14 1340 07/20/14 1445  AFPTM 2.8 3.6    Assessment and Plan:  Edwin Love is doing relatively well although he does continue to drink some alcohol.  His liver lesions remain stable on the 3 month follow-up MRI.  It was noted that motion artifact from the diaphragm limits the utility of MRI and that his lesions may be best evaluated by CT.   - I will order a repeat AFP today to see if there is any continued trend upward.  If the AFP is starting to rise significantly, I will schedule Edwin Love for an attempted biopsy.  - If the AFP remains low we will repeat imaging in 3 months with a triple phase CT scan.   - He will follow up as scheduled in January for routine surveillance of his TIPS   Signed,  Criselda Peaches, MD    I spent a total of 15 minutes face to face in clinical consultation, greater than 50% of which was counseling/coordinating care for liver lesions.  SignedLaurence Ferrari, [place] 11/25/2014, 11:41 AM

## 2014-11-26 ENCOUNTER — Telehealth: Payer: Self-pay | Admitting: Emergency Medicine

## 2014-11-26 ENCOUNTER — Other Ambulatory Visit: Payer: Self-pay | Admitting: Radiology

## 2014-11-26 LAB — AFP TUMOR MARKER: AFP-Tumor Marker: 4.6 ng/mL (ref <6.1)

## 2014-11-26 NOTE — Telephone Encounter (Signed)
-----   Message from Jacqulynn Cadet, MD sent at 11/26/2014  9:29 AM EST ----- Regarding: RE: afp results Yes, please call him and let him know that the numbers are not too high and we will scan him again in 3 months.  We're switching back to triple phase liver protocol CT b/c he moves too much on the MRI.  His last report said they can see it better on CT with him.  Also, repeat AFP and full liver labs in 3 months.   Thanks!    HKM  ----- Message -----    From: Janith Lima, EMT    Sent: 11/26/2014   9:07 AM      To: Jacqulynn Cadet, MD Subject: afp results                                    Do I need to call pt to let him know of results ?  Anything we need to do at this point w/ these results?  Tams ----- Message -----    From: [facility]    Sent: 11/26/2014   4:29 AM      To: Janith Lima, EMT

## 2014-11-26 NOTE — Telephone Encounter (Signed)
Called pt to make him aware of the message below from DR HM.  Will call him for the TIPS f/u for end of Jan. And then 24mos to f/u CT and visit w/ DR HM

## 2014-12-09 ENCOUNTER — Other Ambulatory Visit (HOSPITAL_COMMUNITY): Payer: Self-pay | Admitting: Interventional Radiology

## 2014-12-09 DIAGNOSIS — Z95828 Presence of other vascular implants and grafts: Secondary | ICD-10-CM

## 2014-12-13 ENCOUNTER — Encounter: Payer: Self-pay | Admitting: Emergency Medicine

## 2014-12-23 ENCOUNTER — Encounter (HOSPITAL_COMMUNITY): Payer: Self-pay | Admitting: Gastroenterology

## 2015-01-03 ENCOUNTER — Encounter: Payer: Self-pay | Admitting: Emergency Medicine

## 2015-01-04 ENCOUNTER — Other Ambulatory Visit: Payer: Medicaid Other

## 2015-01-04 ENCOUNTER — Inpatient Hospital Stay
Admission: RE | Admit: 2015-01-04 | Discharge: 2015-01-04 | Disposition: A | Discharge status: Home / Self Care | Payer: Medicaid Other | Source: Ambulatory Visit | Attending: Interventional Radiology | Admitting: Interventional Radiology

## 2015-01-13 ENCOUNTER — Other Ambulatory Visit: Payer: Medicaid Other

## 2015-01-13 ENCOUNTER — Inpatient Hospital Stay: Admission: RE | Admit: 2015-01-13 | Payer: Medicaid Other | Source: Ambulatory Visit

## 2015-01-25 ENCOUNTER — Other Ambulatory Visit: Payer: Medicaid Other

## 2015-01-25 ENCOUNTER — Inpatient Hospital Stay: Admission: RE | Admit: 2015-01-25 | Payer: Medicaid Other | Source: Ambulatory Visit

## 2015-02-10 ENCOUNTER — Ambulatory Visit
Admission: RE | Admit: 2015-02-10 | Discharge: 2015-02-10 | Disposition: A | Discharge status: Home / Self Care | Payer: Medicaid Other | Source: Ambulatory Visit | Attending: Interventional Radiology | Admitting: Interventional Radiology

## 2015-02-10 ENCOUNTER — Other Ambulatory Visit: Payer: Self-pay | Admitting: *Deleted

## 2015-02-10 DIAGNOSIS — Z95828 Presence of other vascular implants and grafts: Secondary | ICD-10-CM

## 2015-02-10 HISTORY — PX: IR GENERIC HISTORICAL: IMG1180011

## 2015-02-10 LAB — COMPREHENSIVE METABOLIC PANEL
ALT: 17 U/L (ref 0–53)
AST: 47 U/L — AB (ref 0–37)
Albumin: 2.7 g/dL — ABNORMAL LOW (ref 3.5–5.2)
Alkaline Phosphatase: 138 U/L — ABNORMAL HIGH (ref 39–117)
BILIRUBIN TOTAL: 0.8 mg/dL (ref 0.2–1.2)
BUN: 39 mg/dL — ABNORMAL HIGH (ref 6–23)
CO2: 17 mEq/L — ABNORMAL LOW (ref 19–32)
Calcium: 8.9 mg/dL (ref 8.4–10.5)
Chloride: 110 mEq/L (ref 96–112)
Creat: 2.67 mg/dL — ABNORMAL HIGH (ref 0.50–1.35)
Glucose, Bld: 83 mg/dL (ref 70–99)
POTASSIUM: 4.3 meq/L (ref 3.5–5.3)
SODIUM: 139 meq/L (ref 135–145)
Total Protein: 7.1 g/dL (ref 6.0–8.3)

## 2015-02-10 NOTE — Progress Notes (Signed)
Chief Complaint: Chief Complaint  Patient presents with  . Follow-up    s/p TIPS    Referring Physician(s): Winslow Verrill  History of Present Illness: Edwin Love is a 54 y.o. male with alcoholic and HCV cirrhosis consultative by life-threatening hemorrhage requiring emergent surgical ligation of bleeding rectal varices. After stabilization, the patient underwent creation of a TIPS as well as coil embolization of the portosystemic collaterals on 07/05/2014.  Additionally, he has small lesions in the right hepatic dome which we are actively following. He presents today for routine TIPS follow-up evaluation. His duplex ultrasound performed earlier today demonstrates no evidence of stenosis or other cup kidding feature.  Edwin Love reports that he feels good today.  His main complaint is bunion pain in his feet for which she is seeking a podiatrist. He denies encephalopathy, nausea, vomiting, hematemesis, bright red blood per rectum or melena. No issues with abdominal swelling or ascites. No abdominal pain. No fevers or chills.  Past Medical History  Diagnosis Date  . Hypertension   . Back pain   . Renal disorder     CKD stage 3 noted in 02/2014 [facility]  . Anemia 01/2014  . LVH (left ventricular hypertrophy) 02/2014    grade 2 diastolic dysfunction.   . Ankle fracture, left 10/2008    non surgical mgt.   . Thrombocytopenia 06/2014  . Coagulopathy 06/2014  . Esophageal varices   . GI bleed   . Cirrhosis     Past Surgical History  Procedure Laterality Date  . Eye surgery Left 1990's    Foreign body  . Inguinal hernia repair Right 02/08/2014    Procedure: HERNIA REPAIR INGUINAL INCARCERATED;  Surgeon: Edward Jolly, MD;  Location: [place];  Service: General;  Laterality: Right;  . Insertion of mesh Right 02/08/2014    Procedure: INSERTION OF MESH;  Surgeon: Edward Jolly, MD;  Location: [place];  Service: General;  Laterality: Right;  . Hernia repair    .  Esophagogastroduodenoscopy N/A 06/24/2014    Procedure: ESOPHAGOGASTRODUODENOSCOPY (EGD);  Surgeon: Inda Castle, MD;  Location: [place];  Service: Endoscopy;  Laterality: N/A;  . Colonoscopy N/A 06/25/2014    Procedure: COLONOSCOPY;  Surgeon: Inda Castle, MD;  Location: [place];  Service: Endoscopy;  Laterality: N/A;  . Hemorrhoid surgery N/A 06/25/2014    Procedure: oversew of rectal varices;  Surgeon: Harl Bowie, MD;  Location: [place];  Service: General;  Laterality: N/A;  . Radiology with anesthesia N/A 07/02/2014    Procedure: RADIOLOGY WITH ANESTHESIA;  Surgeon: Jacqulynn Cadet, MD;  Location: [place];  Service: Radiology;  Laterality: N/A;    Allergies: Review of patient's allergies indicates no known allergies.  Medications: Prior to Admission medications   Medication Sig Start Date End Date Taking? Authorizing Provider  carvedilol (COREG) 6.25 MG tablet TAKE 1 TABLET TWICE A DAY WITH A MEAL 09/16/14  Yes Inda Castle, MD  citalopram (CELEXA) 20 MG tablet Take 20 mg by mouth daily.   Yes Historical Provider, MD  ferrous sulfate 325 (65 FE) MG tablet Take 325 mg by mouth 3 (three) times daily with meals.   Yes Historical Provider, MD  folic acid (FOLVITE) 1 MG tablet Take 1 mg by mouth daily.   Yes Historical Provider, MD  pantoprazole (PROTONIX) 40 MG tablet Take 40 mg by mouth daily.   Yes Historical Provider, MD     Family History  Problem Relation Age of Onset  .  Diabetes Brother     Mother too  . Hypertension      family  . Sickle cell anemia Sister     History   Social History  . Marital Status: Single    Spouse Name: N/A  . Number of Children: N/A  . Years of Education: N/A   Social History Main Topics  . Smoking status: Current Every Day Smoker -- 0.50 packs/day for 34 years    Types: Cigarettes  . Smokeless tobacco: Never Used     Comment: Smoke since 54 years old  . Alcohol Use: Yes     Comment: 40 oz every 2-3 days  . Drug Use: Yes      Comment: Cocaine 15 years ago, Marijuana 4 mo ago  . Sexual Activity: Not on file   Other Topics Concern  . Not on file   Social History Narrative    Review of Systems: A 12 point ROS discussed and pertinent positives are indicated in the HPI above.  All other systems are negative.  Review of Systems  Vital Signs: BP 178/102 mmHg  Pulse 86  Temp(Src) 97.7 F (36.5 C) (Oral)  Resp 14  Ht 5\' 9"  (1.753 m)  Wt 206 lb (93.441 kg)  BMI 30.41 kg/m2  SpO2 100%  Physical Exam  Constitutional: He is oriented to person, place, and time. He appears well-developed and well-nourished. No distress.  HENT:  Head: Normocephalic and atraumatic.  Eyes: No scleral icterus.  Cardiovascular: Normal rate and regular rhythm.   Pulmonary/Chest: Effort normal and breath sounds normal.  Abdominal: Soft. He exhibits no distension. There is no tenderness.  Neurological: He is alert and oriented to person, place, and time.  Skin: Skin is warm and dry.  Psychiatric: He has a normal mood and affect. His behavior is normal.  Nursing note and vitals reviewed.    Imaging: [place] Art/ven Flow Abd Pelv Doppler  02/10/2015   CLINICAL DATA:  54 year old male with alcoholic and HCV cirrhosis complicated by life-threatening lower GI bleed from rectal varices fed by portosystemic collaterals. Patient underwent creation of a TIPS on 07/02/2014 and presents for scheduled 6 month followup duplex ultrasound.  EXAM: DUPLEX ULTRASOUND OF LIVER AND TIPS SHUNT  TECHNIQUE: Color and duplex Doppler ultrasound was performed to evaluate the hepatic in-flow and out-flow vessels.  COMPARISON:  Prior TIPS ultrasound 09/15/2014  FINDINGS: TIPS Shunt Velocities  Proximal:  126 cm/sec  Mid:  105  Distal:  88 cm/sec  Portal Vein Velocities  Main:  57 cm/sec, hepatopetal  Right:  126 cm/sec, hepatopetal  Left:  31 cm/sec, hepatofugal  Hepatic Vein Velocities  Right:  20 cm/sec  Middle:  88 cm/sec  Left:  20 cm/sec  Hepatic Artery  Velocity:  169 centimeters/seconds  Splenic Vein Velocity::  20 cm/sec  Varices:  None  Ascites: None  Other: Heterogeneous liver with multifocal nodularity consistent with underlying cirrhosis. No discrete mass identified.  IMPRESSION: Widely patent middle hepatic to right portal vein TIPS shunt. No evidence of stenosis.  Signed,  Criselda Peaches, MD  [facility]  [facility]   Electronically Signed   By: Jacqulynn Cadet M.D.   On: 02/10/2015 17:55    Labs:  CBC:  Recent Labs  07/07/14 0940 07/20/14 1445 08/15/14 2045 08/16/14 0450  WBC 10.7* 7.7 6.9 5.5  HGB 8.2* 8.8 Repeated and verified X2.* 9.0* 9.5*  HCT 25.1* 27.0* 25.9* 27.7*  PLT 158 157.0 104* 96*    COAGS:  Recent Labs  06/25/14 0300  06/25/14 2130  06/28/14 0500  06/29/14 0410 07/02/14 0010 07/03/14 0255 07/20/14 1445  INR 2.33*  < > 2.35*  < > 1.62*  < > 1.55* 1.43 1.42 1.3*  APTT 36  --  46*  --  32  --   --  36  --   --   < > = values in this interval not displayed.  BMP:  Recent Labs  07/04/14 0318 07/06/14 0630 07/20/14 1445 08/15/14 2045 08/16/14 0450 11/08/14 1416  NA 143 140 139 144 146 142  K 3.5* 3.9 3.7 3.1* 3.1* 4.6  CL 105 106 113* 112 115* 115*  CO2 25 22 18* 16* 20 21  GLUCOSE 117* 89 87 85 100* 104*  BUN 28* 20 12 11 11 18   CALCIUM 7.4* 7.2* 8.4 8.2* 8.3* 8.8  CREATININE 1.65* 1.40* 1.4 1.14 1.04 1.42*  GFRNONAA 46* 56*  --  72* 80*  --   GFRAA 53* 65*  --  83* >90  --     LIVER FUNCTION TESTS:  Recent Labs  07/06/14 0630 07/20/14 1445 08/15/14 2045 11/08/14 1416  BILITOT 2.2* 1.2 0.7 0.5  AST 41* 24 28 79*  ALT 25 12 9 25   ALKPHOS 116 141* 90 150*  PROT 5.3* 7.1 6.8 7.5  ALBUMIN 1.7* 2.0* 1.8* 2.7*    TUMOR MARKERS:  Recent Labs  06/24/14 1340 07/20/14 1445 11/25/14 1105  AFPTM 2.8 3.6 4.6    Assessment and Plan:  Doing remarkably well now 6 months status post creation of TIPS for life-threatening  rectal hemorrhoidal hemorrhage secondary to portal hypertension and portosystemic collaterals.  His TIPS shunt looks great on the duplex ultrasound today and Edwin Love remains essentially clinically asymptomatic. No evidence of further bleeding.  1.) Patient to get labs today including CBC, CMP, INR and AFP 2.) Patient needs CT scan of the abdomen with and without contrast (liver protocol) for continued follow-up of his right liver lesions at some point within the next month.  His last imaging evaluation was an MRI of the abdomen dated 11/11/2014. At that time, it was noted that respiratory motion limits the utility of MRI and further evaluation should be by CT.  SignedJacqulynn Cadet 02/10/2015, 5:56 PM   I spent a total of 10 Minutes in face to face in clinical consultation, greater than 50% of which was counseling/coordinating care for post TIPS.

## 2015-02-11 ENCOUNTER — Other Ambulatory Visit (HOSPITAL_COMMUNITY): Payer: Self-pay | Admitting: Interventional Radiology

## 2015-02-11 DIAGNOSIS — K7030 Alcoholic cirrhosis of liver without ascites: Secondary | ICD-10-CM

## 2015-02-11 DIAGNOSIS — Z95828 Presence of other vascular implants and grafts: Secondary | ICD-10-CM

## 2015-02-11 DIAGNOSIS — B1920 Unspecified viral hepatitis C without hepatic coma: Secondary | ICD-10-CM

## 2015-02-11 LAB — CBC
HEMATOCRIT: 38.6 % — AB (ref 39.0–52.0)
HEMOGLOBIN: 12.8 g/dL — AB (ref 13.0–17.0)
MCH: 30.4 pg (ref 26.0–34.0)
MCHC: 33.2 g/dL (ref 30.0–36.0)
MCV: 91.7 fL (ref 78.0–100.0)
MPV: 9.6 fL (ref 8.6–12.4)
Platelets: 139 10*3/uL — ABNORMAL LOW (ref 150–400)
RBC: 4.21 MIL/uL — ABNORMAL LOW (ref 4.22–5.81)
RDW: 16.8 % — ABNORMAL HIGH (ref 11.5–15.5)
WBC: 8.4 10*3/uL (ref 4.0–10.5)

## 2015-02-11 LAB — AFP TUMOR MARKER: AFP-Tumor Marker: 6 ng/mL (ref <6.1)

## 2015-02-11 LAB — PROTIME-INR
INR: 1.26 (ref <1.50)
PROTHROMBIN TIME: 15.8 s — AB (ref 11.6–15.2)

## 2015-02-23 ENCOUNTER — Other Ambulatory Visit: Payer: Self-pay | Admitting: Nephrology

## 2015-02-23 DIAGNOSIS — N183 Chronic kidney disease, stage 3 (moderate): Secondary | ICD-10-CM

## 2015-03-02 ENCOUNTER — Ambulatory Visit
Admission: RE | Admit: 2015-03-02 | Discharge: 2015-03-02 | Disposition: A | Discharge status: Home / Self Care | Payer: Medicaid Other | Source: Ambulatory Visit | Attending: Nephrology | Admitting: Nephrology

## 2015-03-02 DIAGNOSIS — N183 Chronic kidney disease, stage 3 (moderate): Secondary | ICD-10-CM

## 2015-04-06 ENCOUNTER — Other Ambulatory Visit: Payer: Self-pay | Admitting: *Deleted

## 2015-04-06 ENCOUNTER — Encounter: Payer: Self-pay | Admitting: Vascular Surgery

## 2015-04-06 DIAGNOSIS — N184 Chronic kidney disease, stage 4 (severe): Secondary | ICD-10-CM

## 2015-04-06 DIAGNOSIS — Z01810 Encounter for preprocedural cardiovascular examination: Secondary | ICD-10-CM

## 2015-04-18 ENCOUNTER — Inpatient Hospital Stay (HOSPITAL_COMMUNITY)
Admission: EM | Admit: 2015-04-18 | Discharge: 2015-04-21 | DRG: 291 | Disposition: A | Discharge status: Home / Self Care | Payer: Medicaid Other | Attending: Family Medicine | Admitting: Family Medicine

## 2015-04-18 ENCOUNTER — Emergency Department (HOSPITAL_COMMUNITY): Payer: Medicaid Other

## 2015-04-18 ENCOUNTER — Encounter (HOSPITAL_COMMUNITY): Payer: Self-pay

## 2015-04-18 DIAGNOSIS — N184 Chronic kidney disease, stage 4 (severe): Secondary | ICD-10-CM | POA: Diagnosis present

## 2015-04-18 DIAGNOSIS — M7989 Other specified soft tissue disorders: Secondary | ICD-10-CM | POA: Diagnosis not present

## 2015-04-18 DIAGNOSIS — F17210 Nicotine dependence, cigarettes, uncomplicated: Secondary | ICD-10-CM | POA: Diagnosis present

## 2015-04-18 DIAGNOSIS — D696 Thrombocytopenia, unspecified: Secondary | ICD-10-CM | POA: Diagnosis present

## 2015-04-18 DIAGNOSIS — E876 Hypokalemia: Secondary | ICD-10-CM | POA: Diagnosis present

## 2015-04-18 DIAGNOSIS — I10 Essential (primary) hypertension: Secondary | ICD-10-CM | POA: Diagnosis not present

## 2015-04-18 DIAGNOSIS — J9601 Acute respiratory failure with hypoxia: Secondary | ICD-10-CM | POA: Diagnosis present

## 2015-04-18 DIAGNOSIS — I248 Other forms of acute ischemic heart disease: Secondary | ICD-10-CM | POA: Diagnosis present

## 2015-04-18 DIAGNOSIS — D649 Anemia, unspecified: Secondary | ICD-10-CM | POA: Diagnosis present

## 2015-04-18 DIAGNOSIS — B1920 Unspecified viral hepatitis C without hepatic coma: Secondary | ICD-10-CM | POA: Diagnosis present

## 2015-04-18 DIAGNOSIS — N183 Chronic kidney disease, stage 3 unspecified: Secondary | ICD-10-CM | POA: Diagnosis present

## 2015-04-18 DIAGNOSIS — R0602 Shortness of breath: Secondary | ICD-10-CM | POA: Diagnosis present

## 2015-04-18 DIAGNOSIS — K7030 Alcoholic cirrhosis of liver without ascites: Secondary | ICD-10-CM | POA: Diagnosis present

## 2015-04-18 DIAGNOSIS — F1010 Alcohol abuse, uncomplicated: Secondary | ICD-10-CM | POA: Diagnosis present

## 2015-04-18 DIAGNOSIS — I509 Heart failure, unspecified: Secondary | ICD-10-CM | POA: Diagnosis not present

## 2015-04-18 DIAGNOSIS — I8500 Esophageal varices without bleeding: Secondary | ICD-10-CM | POA: Diagnosis present

## 2015-04-18 DIAGNOSIS — Z95828 Presence of other vascular implants and grafts: Secondary | ICD-10-CM

## 2015-04-18 DIAGNOSIS — I129 Hypertensive chronic kidney disease with stage 1 through stage 4 chronic kidney disease, or unspecified chronic kidney disease: Secondary | ICD-10-CM | POA: Diagnosis present

## 2015-04-18 DIAGNOSIS — N170 Acute kidney failure with tubular necrosis: Secondary | ICD-10-CM | POA: Diagnosis present

## 2015-04-18 DIAGNOSIS — I5033 Acute on chronic diastolic (congestive) heart failure: Secondary | ICD-10-CM | POA: Diagnosis present

## 2015-04-18 DIAGNOSIS — I161 Hypertensive emergency: Secondary | ICD-10-CM | POA: Diagnosis present

## 2015-04-18 DIAGNOSIS — R809 Proteinuria, unspecified: Secondary | ICD-10-CM | POA: Diagnosis present

## 2015-04-18 DIAGNOSIS — Z720 Tobacco use: Secondary | ICD-10-CM | POA: Diagnosis present

## 2015-04-18 DIAGNOSIS — I5032 Chronic diastolic (congestive) heart failure: Secondary | ICD-10-CM | POA: Diagnosis present

## 2015-04-18 DIAGNOSIS — J810 Acute pulmonary edema: Secondary | ICD-10-CM | POA: Diagnosis present

## 2015-04-18 DIAGNOSIS — Z9114 Patient's other noncompliance with medication regimen: Secondary | ICD-10-CM | POA: Diagnosis present

## 2015-04-18 DIAGNOSIS — Z9119 Patient's noncompliance with other medical treatment and regimen: Secondary | ICD-10-CM | POA: Diagnosis present

## 2015-04-18 LAB — COMPREHENSIVE METABOLIC PANEL
ALT: 18 U/L (ref 17–63)
AST: 43 U/L — ABNORMAL HIGH (ref 15–41)
Albumin: 2.5 g/dL — ABNORMAL LOW (ref 3.5–5.0)
Alkaline Phosphatase: 125 U/L (ref 38–126)
Anion gap: 12 (ref 5–15)
BUN: 33 mg/dL — ABNORMAL HIGH (ref 6–20)
CHLORIDE: 108 mmol/L (ref 101–111)
CO2: 16 mmol/L — ABNORMAL LOW (ref 22–32)
Calcium: 8.6 mg/dL — ABNORMAL LOW (ref 8.9–10.3)
Creatinine, Ser: 2.93 mg/dL — ABNORMAL HIGH (ref 0.61–1.24)
GFR calc non Af Amer: 23 mL/min — ABNORMAL LOW (ref >60)
GFR, EST AFRICAN AMERICAN: 26 mL/min — AB (ref >60)
GLUCOSE: 101 mg/dL — AB (ref 70–99)
POTASSIUM: 3.9 mmol/L (ref 3.5–5.1)
SODIUM: 136 mmol/L (ref 135–145)
Total Bilirubin: 0.9 mg/dL (ref 0.3–1.2)
Total Protein: 7.4 g/dL (ref 6.5–8.1)

## 2015-04-18 LAB — URINALYSIS, ROUTINE W REFLEX MICROSCOPIC
Bilirubin Urine: NEGATIVE
GLUCOSE, UA: NEGATIVE mg/dL
Ketones, ur: NEGATIVE mg/dL
LEUKOCYTES UA: NEGATIVE
Nitrite: NEGATIVE
Protein, ur: 100 mg/dL — AB
Specific Gravity, Urine: 1.006 (ref 1.005–1.030)
UROBILINOGEN UA: 0.2 mg/dL (ref 0.0–1.0)
pH: 6 (ref 5.0–8.0)

## 2015-04-18 LAB — CBC WITH DIFFERENTIAL/PLATELET
BASOS ABS: 0.1 10*3/uL (ref 0.0–0.1)
Basophils Relative: 1 % (ref 0–1)
EOS ABS: 0.3 10*3/uL (ref 0.0–0.7)
Eosinophils Relative: 4 % (ref 0–5)
HCT: 32 % — ABNORMAL LOW (ref 39.0–52.0)
Hemoglobin: 11.2 g/dL — ABNORMAL LOW (ref 13.0–17.0)
Lymphocytes Relative: 14 % (ref 12–46)
Lymphs Abs: 1.1 10*3/uL (ref 0.7–4.0)
MCH: 31.6 pg (ref 26.0–34.0)
MCHC: 35 g/dL (ref 30.0–36.0)
MCV: 90.4 fL (ref 78.0–100.0)
MONO ABS: 0.6 10*3/uL (ref 0.1–1.0)
Monocytes Relative: 8 % (ref 3–12)
Neutro Abs: 6.1 10*3/uL (ref 1.7–7.7)
Neutrophils Relative %: 73 % (ref 43–77)
Platelets: 122 10*3/uL — ABNORMAL LOW (ref 150–400)
RBC: 3.54 MIL/uL — ABNORMAL LOW (ref 4.22–5.81)
RDW: 13.4 % (ref 11.5–15.5)
WBC: 8.2 10*3/uL (ref 4.0–10.5)

## 2015-04-18 LAB — IRON AND TIBC
Iron: 97 ug/dL (ref 45–182)
Saturation Ratios: 32 % (ref 17.9–39.5)
TIBC: 305 ug/dL (ref 250–450)
UIBC: 208 ug/dL

## 2015-04-18 LAB — BLOOD GAS, ARTERIAL
Acid-base deficit: 7.5 mmol/L — ABNORMAL HIGH (ref 0.0–2.0)
Bicarbonate: 16.5 mEq/L — ABNORMAL LOW (ref 20.0–24.0)
DRAWN BY: 331001
O2 Content: 3 L/min
O2 SAT: 93.1 %
PO2 ART: 66.9 mmHg — AB (ref 80.0–100.0)
Patient temperature: 97.5
TCO2: 17.4 mmol/L (ref 0–100)
pCO2 arterial: 27.2 mmHg — ABNORMAL LOW (ref 35.0–45.0)
pH, Arterial: 7.397 (ref 7.350–7.450)

## 2015-04-18 LAB — LIPASE, BLOOD: Lipase: 78 U/L — ABNORMAL HIGH (ref 22–51)

## 2015-04-18 LAB — URINE MICROSCOPIC-ADD ON

## 2015-04-18 LAB — I-STAT CHEM 8, ED
BUN: 32 mg/dL — ABNORMAL HIGH (ref 6–20)
Calcium, Ion: 1.15 mmol/L (ref 1.12–1.23)
Chloride: 109 mmol/L (ref 101–111)
Creatinine, Ser: 3.1 mg/dL — ABNORMAL HIGH (ref 0.61–1.24)
Glucose, Bld: 101 mg/dL — ABNORMAL HIGH (ref 70–99)
HCT: 35 % — ABNORMAL LOW (ref 39.0–52.0)
Hemoglobin: 11.9 g/dL — ABNORMAL LOW (ref 13.0–17.0)
Potassium: 4 mmol/L (ref 3.5–5.1)
Sodium: 140 mmol/L (ref 135–145)
TCO2: 15 mmol/L (ref 0–100)

## 2015-04-18 LAB — PROTIME-INR
INR: 1.23 (ref 0.00–1.49)
Prothrombin Time: 15.6 seconds — ABNORMAL HIGH (ref 11.6–15.2)

## 2015-04-18 LAB — TROPONIN I
TROPONIN I: 0.05 ng/mL — AB (ref <0.031)
TROPONIN I: 0.04 ng/mL — AB (ref <0.031)
Troponin I: 0.05 ng/mL — ABNORMAL HIGH (ref <0.031)

## 2015-04-18 LAB — I-STAT TROPONIN, ED: Troponin i, poc: 0.03 ng/mL (ref 0.00–0.08)

## 2015-04-18 LAB — RETICULOCYTES
RBC.: 3.01 MIL/uL — ABNORMAL LOW (ref 4.22–5.81)
RETIC COUNT ABSOLUTE: 51.2 10*3/uL (ref 19.0–186.0)
Retic Ct Pct: 1.7 % (ref 0.4–3.1)

## 2015-04-18 LAB — FERRITIN: Ferritin: 55 ng/mL (ref 24–336)

## 2015-04-18 LAB — TSH: TSH: 3.373 u[IU]/mL (ref 0.350–4.500)

## 2015-04-18 LAB — MRSA PCR SCREENING: MRSA BY PCR: NEGATIVE

## 2015-04-18 LAB — FOLATE: FOLATE: 11.4 ng/mL (ref >5.9)

## 2015-04-18 LAB — I-STAT CG4 LACTIC ACID, ED: Lactic Acid, Venous: 1.15 mmol/L (ref 0.5–2.0)

## 2015-04-18 LAB — BRAIN NATRIURETIC PEPTIDE: B Natriuretic Peptide: 3344.2 pg/mL — ABNORMAL HIGH (ref 0.0–100.0)

## 2015-04-18 LAB — VITAMIN B12: Vitamin B-12: 687 pg/mL (ref 180–914)

## 2015-04-18 LAB — GLUCOSE, CAPILLARY: Glucose-Capillary: 99 mg/dL (ref 70–99)

## 2015-04-18 MED ORDER — FUROSEMIDE 10 MG/ML IJ SOLN
80.0000 mg | Freq: Once | INTRAMUSCULAR | Status: AC
  Administered 2015-04-18: 80 mg via INTRAVENOUS
  Filled 2015-04-18: qty 8

## 2015-04-18 MED ORDER — SODIUM CHLORIDE 0.9 % IV SOLN: 250.0000 mL | INTRAVENOUS | Status: DC | PRN

## 2015-04-18 MED ORDER — CARVEDILOL 6.25 MG PO TABS: 6.2500 mg | ORAL_TABLET | Freq: Two times a day (BID) | ORAL | Status: DC

## 2015-04-18 MED ORDER — VITAMIN B-1 100 MG PO TABS
100.0000 mg | ORAL_TABLET | Freq: Every day | ORAL | Status: DC
  Administered 2015-04-19 – 2015-04-21 (×3): 100 mg via ORAL
  Filled 2015-04-18 (×4): qty 1

## 2015-04-18 MED ORDER — ADULT MULTIVITAMIN W/MINERALS CH
1.0000 | ORAL_TABLET | Freq: Every day | ORAL | Status: DC
  Administered 2015-04-19 – 2015-04-21 (×3): 1 via ORAL
  Filled 2015-04-18 (×4): qty 1

## 2015-04-18 MED ORDER — FOLIC ACID 1 MG PO TABS
1.0000 mg | ORAL_TABLET | Freq: Every day | ORAL | Status: DC
  Administered 2015-04-19 – 2015-04-21 (×3): 1 mg via ORAL
  Filled 2015-04-18 (×4): qty 1

## 2015-04-18 MED ORDER — LORAZEPAM 1 MG PO TABS
1.0000 mg | ORAL_TABLET | Freq: Four times a day (QID) | ORAL | Status: AC | PRN
  Filled 2015-04-18: qty 1

## 2015-04-18 MED ORDER — SODIUM CHLORIDE 0.9 % IJ SOLN: 3.0000 mL | INTRAMUSCULAR | Status: DC | PRN

## 2015-04-18 MED ORDER — LORAZEPAM 2 MG/ML IJ SOLN
1.0000 mg | Freq: Four times a day (QID) | INTRAMUSCULAR | Status: AC | PRN
  Filled 2015-04-18: qty 1

## 2015-04-18 MED ORDER — FUROSEMIDE 10 MG/ML IJ SOLN
80.0000 mg | Freq: Three times a day (TID) | INTRAMUSCULAR | Status: DC
  Administered 2015-04-18 – 2015-04-19 (×3): 80 mg via INTRAVENOUS
  Filled 2015-04-18 (×6): qty 8

## 2015-04-18 MED ORDER — SODIUM CHLORIDE 0.9 % IJ SOLN
3.0000 mL | Freq: Two times a day (BID) | INTRAMUSCULAR | Status: DC
  Administered 2015-04-18 – 2015-04-20 (×4): 3 mL via INTRAVENOUS

## 2015-04-18 MED ORDER — HYDRALAZINE HCL 20 MG/ML IJ SOLN
10.0000 mg | Freq: Four times a day (QID) | INTRAMUSCULAR | Status: DC | PRN
  Administered 2015-04-18 – 2015-04-21 (×5): 10 mg via INTRAVENOUS
  Filled 2015-04-18 (×5): qty 1

## 2015-04-18 MED ORDER — IPRATROPIUM-ALBUTEROL 0.5-2.5 (3) MG/3ML IN SOLN: 3.0000 mL | RESPIRATORY_TRACT | Status: DC | PRN

## 2015-04-18 MED ORDER — ACETAMINOPHEN 325 MG PO TABS: 650.0000 mg | ORAL_TABLET | ORAL | Status: DC | PRN

## 2015-04-18 MED ORDER — THIAMINE HCL 100 MG/ML IJ SOLN
100.0000 mg | Freq: Every day | INTRAMUSCULAR | Status: DC
  Administered 2015-04-18: 100 mg via INTRAVENOUS
  Filled 2015-04-18 (×2): qty 1

## 2015-04-18 MED ORDER — ONDANSETRON HCL 4 MG/2ML IJ SOLN: 4.0000 mg | Freq: Four times a day (QID) | INTRAMUSCULAR | Status: DC | PRN

## 2015-04-18 MED ORDER — NITROGLYCERIN IN D5W 200-5 MCG/ML-% IV SOLN
50.0000 ug/min | INTRAVENOUS | Status: DC
  Administered 2015-04-18: 100 ug/min via INTRAVENOUS
  Administered 2015-04-19 – 2015-04-20 (×7): 200 ug/min via INTRAVENOUS
  Filled 2015-04-18 (×15): qty 250

## 2015-04-18 MED ORDER — ENALAPRILAT 1.25 MG/ML IV SOLN
1.2500 mg | Freq: Once | INTRAVENOUS | Status: AC
  Administered 2015-04-18: 1.25 mg via INTRAVENOUS
  Filled 2015-04-18: qty 1

## 2015-04-18 MED ORDER — NITROGLYCERIN IN D5W 200-5 MCG/ML-% IV SOLN
INTRAVENOUS | Status: AC
  Filled 2015-04-18: qty 250

## 2015-04-18 NOTE — Progress Notes (Signed)
Placed pt on BiPAP at this time, pt tolerating well. RT will continue to monitor.

## 2015-04-18 NOTE — Progress Notes (Signed)
3S08 [place], BP sys 180-190 on Nitro at 200 mics, shallow bx, pt is symptomatic, MD/N, nursing will cont to monitor

## 2015-04-18 NOTE — Progress Notes (Signed)
Utilization review completed.  

## 2015-04-18 NOTE — ED Notes (Signed)
Dr. Claudine Mouton at bedside with ultrasound of heart and lungs.

## 2015-04-18 NOTE — ED Notes (Signed)
Pt comes from home via [facility], woke up this AM went to use bathroom and had SOB, EMS arrived wheezing noted stats originally 83 on RA, placed on non rebreather. PTA albuterol given, then rales, placed on CPAP w/neb and nitro.

## 2015-04-18 NOTE — Consult Note (Signed)
[facility] Renal Consultation Note  PCP - [facility] Requesting MD: [facility] / Dr. Bonnielee Haff Indication for Consultation: Acute on Chronic Kidney Disease-III  HPI:  Edwin Love is a 54 y.o. male with PMH alcoholic cirrhosis w/ portal HTN & esophageal varices, dCHF (Grade 2), CKD-III, HTN, and active tobacco abuse. He was admitted today after developing acute SOB after waking up this morning, additionally with lower ext edema (?chronic). He states that he was only taking Sodium Bicarb PO prior to hospitalization, and he ran out needs a refill, was no longer on any anti-HTN meds and no diuretics. Denies any recent use of NSAIDs. He has been seen by Dr. Lorrene Reid at 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  Creatinine when he saw her on 4/19 was 3.  He was not taking his coreg and was not felt to need lasix with a weight of 202 lbs.   In ED, found to be hypoxic to 83% on RA- weight of 218, placed on BiPAP, received Lasix IV $Remove'80mg'IjWBUSw$  x 1 and Vasotec IV x 1 for diuresis, CXR with some worsening vasc congestion / effusions and bedside thoracic [place] consistent with pulm edema. Also had HTN Emergency BP 224/116, started on Nitro gtt with some improvement to SBP 180s. Cardiology consulted and admitted to [facility] for continued management.  Currently he reports feeling that his breathing is "little better" on the BiPAP, seems to be tolerating it well. Resting without other complaints. Good urination, no foley, using bedside urinal with clear/yellow urine. Feels better- does not seem to be uremic - Denies any chest pain or tightness, abdominal pain, nausea / vomiting  CREAT  Date/Time Value Ref Range Status  02/10/2015 03:36 PM 2.67* 0.50 - 1.35 mg/dL Final  11/08/2014 02:16 PM 1.42* 0.50 - 1.35 mg/dL Final    CREATININE, SER  Date/Time Value Ref Range Status  04/18/2015 05:38 AM 3.10* 0.61 - 1.24 mg/dL Final  04/18/2015 05:32 AM 2.93* 0.61 - 1.24 mg/dL Final  08/16/2014 04:50 AM 1.04 0.50 - 1.35 mg/dL Final  08/15/2014 08:45 PM 1.14 0.50 - 1.35 mg/dL Final  07/20/2014 02:45 PM 1.4 0.4 - 1.5 mg/dL Final  07/06/2014 06:30 AM 1.40* 0.50 - 1.35 mg/dL Final  07/04/2014 03:18 AM 1.65* 0.50 - 1.35 mg/dL Final  07/03/2014 02:55 AM 1.73* 0.50 - 1.35 mg/dL Final  07/02/2014 12:10 AM 1.96* 0.50 - 1.35 mg/dL Final  07/01/2014 02:47 AM 2.01* 0.50 - 1.35 mg/dL Final  06/30/2014 03:29 AM 2.15* 0.50 - 1.35 mg/dL Final  06/29/2014 04:15 PM 2.25* 0.50 - 1.35 mg/dL Final  06/29/2014 04:10 AM 2.69* 0.50 - 1.35 mg/dL Final  06/28/2014 05:00 AM 3.43* 0.50 - 1.35 mg/dL Final  06/27/2014 02:48 AM 3.80* 0.50 - 1.35 mg/dL Final  06/26/2014 05:09 AM 3.50* 0.50 - 1.35 mg/dL Final  06/25/2014 09:30 PM 2.89* 0.50 - 1.35 mg/dL Final  06/25/2014 11:30 AM 2.53* 0.50 - 1.35 mg/dL Final  06/25/2014 03:00 AM 1.97* 0.50 - 1.35 mg/dL Final  06/24/2014 11:14 AM 1.90* 0.50 - 1.35 mg/dL Final  06/24/2014 11:00 AM 1.61* 0.50 - 1.35 mg/dL Final  02/10/2014 02:30 PM 1.24 0.50 - 1.35 mg/dL Final  02/09/2014 03:50 AM 1.01 0.50 - 1.35 mg/dL Final  02/08/2014 07:19 AM 0.91 0.50 - 1.35 mg/dL Final  01/21/2014 05:15 PM 1.41* 0.50 - 1.35  mg/dL Final  01/21/2014 12:54 PM 1.40* 0.50 - 1.35 mg/dL Final  08/06/2010 09:26 AM 0.8 0.4 - 1.5 mg/dL Final  11/01/2008 01:35 PM 0.80  Final  03/30/2008 06:52 PM 1.07  Final     PMHx:   Past Medical History  Diagnosis Date  . Hypertension   . Back pain   . Renal disorder     CKD stage 3 noted in 02/2014 H & P  . Anemia 01/2014  . LVH (left ventricular hypertrophy) 02/2014    grade 2 diastolic dysfunction.   . Ankle fracture, left 10/2008    non surgical mgt.   . Thrombocytopenia 06/2014  . Coagulopathy 06/2014  . Esophageal varices   . GI bleed   . Cirrhosis     Past Surgical History   Procedure Laterality Date  . Eye surgery Left 1990's    Foreign body  . Inguinal hernia repair Right 02/08/2014    Procedure: HERNIA REPAIR INGUINAL INCARCERATED;  Surgeon: Edward Jolly, MD;  Location: [place];  Service: General;  Laterality: Right;  . Insertion of mesh Right 02/08/2014    Procedure: INSERTION OF MESH;  Surgeon: Edward Jolly, MD;  Location: [place];  Service: General;  Laterality: Right;  . Hernia repair    . Esophagogastroduodenoscopy N/A 06/24/2014    Procedure: ESOPHAGOGASTRODUODENOSCOPY (EGD);  Surgeon: Inda Castle, MD;  Location: [place];  Service: Endoscopy;  Laterality: N/A;  . Colonoscopy N/A 06/25/2014    Procedure: COLONOSCOPY;  Surgeon: Inda Castle, MD;  Location: [place];  Service: Endoscopy;  Laterality: N/A;  . Hemorrhoid surgery N/A 06/25/2014    Procedure: oversew of rectal varices;  Surgeon: Harl Bowie, MD;  Location: [place];  Service: General;  Laterality: N/A;  . Radiology with anesthesia N/A 07/02/2014    Procedure: RADIOLOGY WITH ANESTHESIA;  Surgeon: Jacqulynn Cadet, MD;  Location: [place];  Service: Radiology;  Laterality: N/A;    Family Hx:  Family History  Problem Relation Age of Onset  . Diabetes Brother     Mother too  . Hypertension      family  . Sickle cell anemia Sister     Social History:  reports that he has been smoking Cigarettes.  He has a 17 pack-year smoking history. He has never used smokeless tobacco. He reports that he drinks alcohol. He reports that he uses illicit drugs.   - Active alcohol use - last drink yesterday (5/8) with 40 oz beer most days - Active smoker - about 6 cigs daily - Denies illicit drug use   Allergies: No Known Allergies  Medications: Prior to Admission medications   Medication Sig Start Date End Date Taking? Authorizing Provider  carvedilol (COREG) 6.25 MG tablet TAKE 1 TABLET TWICE A DAY WITH A MEAL Patient not taking: Reported on 04/18/2015 09/16/14   Inda Castle,  MD    I have reviewed the patient's current medications. Was not taking coreg  Labs:  Results for orders placed or performed during the hospital encounter of 04/18/15 (from the past 48 hour(s))  CBC with Differential/Platelet     Status: Abnormal   Collection Time: 04/18/15  5:32 AM  Result Value Ref Range   WBC 8.2 4.0 - 10.5 K/uL   RBC 3.54 (L) 4.22 - 5.81 MIL/uL   Hemoglobin 11.2 (L) 13.0 - 17.0 g/dL   HCT 32.0 (L) 39.0 - 52.0 %   MCV 90.4 78.0 - 100.0 fL   MCH 31.6 26.0 - 34.0 pg  MCHC 35.0 30.0 - 36.0 g/dL   RDW 13.4 11.5 - 15.5 %   Platelets 122 (L) 150 - 400 K/uL   Neutrophils Relative % 73 43 - 77 %   Neutro Abs 6.1 1.7 - 7.7 K/uL   Lymphocytes Relative 14 12 - 46 %   Lymphs Abs 1.1 0.7 - 4.0 K/uL   Monocytes Relative 8 3 - 12 %   Monocytes Absolute 0.6 0.1 - 1.0 K/uL   Eosinophils Relative 4 0 - 5 %   Eosinophils Absolute 0.3 0.0 - 0.7 K/uL   Basophils Relative 1 0 - 1 %   Basophils Absolute 0.1 0.0 - 0.1 K/uL  Brain natriuretic peptide     Status: Abnormal   Collection Time: 04/18/15  5:32 AM  Result Value Ref Range   B Natriuretic Peptide 3344.2 (H) 0.0 - 100.0 pg/mL  Comprehensive metabolic panel     Status: Abnormal   Collection Time: 04/18/15  5:32 AM  Result Value Ref Range   Sodium 136 135 - 145 mmol/L   Potassium 3.9 3.5 - 5.1 mmol/L   Chloride 108 101 - 111 mmol/L   CO2 16 (L) 22 - 32 mmol/L   Glucose, Bld 101 (H) 70 - 99 mg/dL   BUN 33 (H) 6 - 20 mg/dL   Creatinine, Ser 2.93 (H) 0.61 - 1.24 mg/dL   Calcium 8.6 (L) 8.9 - 10.3 mg/dL   Total Protein 7.4 6.5 - 8.1 g/dL   Albumin 2.5 (L) 3.5 - 5.0 g/dL   AST 43 (H) 15 - 41 U/L   ALT 18 17 - 63 U/L   Alkaline Phosphatase 125 38 - 126 U/L   Total Bilirubin 0.9 0.3 - 1.2 mg/dL   GFR calc non Af Amer 23 (L) >60 mL/min   GFR calc Af Amer 26 (L) >60 mL/min    Comment: (NOTE) The eGFR has been calculated using the CKD EPI equation. This calculation has not been validated in all clinical situations. eGFR's  persistently <60 mL/min signify possible Chronic Kidney Disease.    Anion gap 12 5 - 15  Lipase, blood     Status: Abnormal   Collection Time: 04/18/15  5:32 AM  Result Value Ref Range   Lipase 78 (H) 22 - 51 U/L  Protime-INR     Status: Abnormal   Collection Time: 04/18/15  5:32 AM  Result Value Ref Range   Prothrombin Time 15.6 (H) 11.6 - 15.2 seconds   INR 1.23 0.00 - 1.49  I-stat troponin, ED     Status: None   Collection Time: 04/18/15  5:35 AM  Result Value Ref Range   Troponin i, poc 0.03 0.00 - 0.08 ng/mL   Comment 3            Comment: Due to the release kinetics of cTnI, a negative result within the first hours of the onset of symptoms does not rule out myocardial infarction with certainty. If myocardial infarction is still suspected, repeat the test at appropriate intervals.   I-Stat CG4 Lactic Acid, ED     Status: None   Collection Time: 04/18/15  5:38 AM  Result Value Ref Range   Lactic Acid, Venous 1.15 0.5 - 2.0 mmol/L  I-stat chem 8, ed     Status: Abnormal   Collection Time: 04/18/15  5:38 AM  Result Value Ref Range   Sodium 140 135 - 145 mmol/L   Potassium 4.0 3.5 - 5.1 mmol/L   Chloride 109 101 - 111  mmol/L   BUN 32 (H) 6 - 20 mg/dL   Creatinine, Ser 3.10 (H) 0.61 - 1.24 mg/dL   Glucose, Bld 101 (H) 70 - 99 mg/dL   Calcium, Ion 1.15 1.12 - 1.23 mmol/L   TCO2 15 0 - 100 mmol/L   Hemoglobin 11.9 (L) 13.0 - 17.0 g/dL   HCT 35.0 (L) 39.0 - 52.0 %  Blood gas, arterial     Status: Abnormal   Collection Time: 04/18/15  9:55 AM  Result Value Ref Range   O2 Content 3.0 L/min   pH, Arterial 7.397 7.350 - 7.450   pCO2 arterial 27.2 (L) 35.0 - 45.0 mmHg   pO2, Arterial 66.9 (L) 80.0 - 100.0 mmHg   Bicarbonate 16.5 (L) 20.0 - 24.0 mEq/L   TCO2 17.4 0 - 100 mmol/L   Acid-base deficit 7.5 (H) 0.0 - 2.0 mmol/L   O2 Saturation 93.1 %   Patient temperature 97.5    Collection site LEFT RADIAL    Drawn by [id number]    Sample type ARTERIAL DRAW    Allens test  (pass/fail) PASS PASS     ROS:  Pertinent items are noted in HPI.  Physical Exam: Filed Vitals:   04/18/15 0954  BP:   Pulse: 84  Temp:   Resp: 27     General: chronically ill-appearing 50 yr AAM, appears comfortable resting in bed on BiPAP, cooperative, NAD HEENT: BiPAP in place Neck: supple, non-tender, +JVD Heart: RRR, no murmurs heard Lungs: Diffuse bilateral crackles heard inc at bases, limited with BiPAP. Abdomen: obese, soft, non-tender and non-distended, no focal masses, +active BS Extremities: Bilateral lower ext below knee with +1 pitting edema, non-tender, no asymmetry or erythema, intact peripheral pulses Skin: warm, dry Neuro: awake, alert, oriented, grossly non-focal  BP 167/76 mmHg  Pulse 78  Temp(Src) 98.4 F (36.9 C) (Axillary)  Resp 25  Ht $R'5\' 9"'ff$  (1.753 m)  Wt 218 lb 7.6 oz (99.1 kg)  BMI 32.25 kg/m2  SpO2 100%   Background: 32 yr M admitted on 5/9 with acute decomp dCHF with 2/2 acute pulm edema leading to acute resp failure (req BiPAP). Additionally with HTN Emergency (BP >220/110). Initial elevated BNP 3344, CXR with vasc congestion, received Lasix IV and Nitro gtt in ED. Cardiology consulted. 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 Prior work-up with negative SPEP / UPEP, UPC 6.7g, neg ANA / ANCA, nml Complete, neg cryoglobulins).  Significant PMH alcoholic cirrhosis with Hep C, dCHF (Grade 2), CKD-III, HTN (poorly controlled, ?med adherence), and active tobacco and alcohol abuse.  Assessment/Plan: 1. 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 Lorrene Reid ([facility]). Last Renal US (02/2015 - R-12.2cm, L-12.9cm, inc b/l echogenicity, no obstruction or mass. - Baseline SCr near 3.0 (last checked 4/19 at [place]) - Mildly elevated SCr to 3.10 (on admit 5/9). BUN 32 with ratio ~10, supporting intrinsic injury. Concern that SCr may be on the rise given seems to be near new baseline with underlying progressive GN. - Agree with increased Lasix IV $Remove'80mg'dpCybSS$  to TID. Re-evaluate fluid status in AM- is diuresing well -  Currently acutely fluid overloaded. Unclear EDW with admit wt 218 lb (most recent 202 lb 4/19 and 209 lb in 09/2014), likely +10-15 lbs - Daily BMET, Strict I/O, Daily wt - Check PTH - Regarding progressive GN (unclear etiology, ?Hep C), not good candidate for renal biopsy - Once medically stabilized, will revisit previous outpatient discussions (by Dr. Lorrene Reid), regarding future HD considerations, would anticipate consult [facility] inpatient for arranging perm access / AVF  2. HTN Emergency / Uncontrolled - On admit BP 224/116. Cardiology following. Remains on Nitro gtt with mild improvement. - Hold further doses ACEi / ARB  3. Acute on chronic Diastolic CHF / Acute Pulmonary Edema - Cardiology following. Last ECHO 02/2014 (LVEF 43-14%, Grd 2 Diastolic dysfxn). ECHO ordered for tomorrow.  4. Acute Hypoxic Respiratory Failure (req BiPap), d/t #3 - Per Primary/Cards - On BiPAP. Lasix IV diuresis.  5. Alcoholic Cirrhosis, with h/o Hep C, complicated eso/rectal varices with h/o hemorrhage - s/p rectal varceal surgery and TIPs procedure, active EtOH use. No active bleeding at this time.  6. Chronic Anemia, in CKD with prior h/o acute variceal bleed - Hgb 11.9, stable, ?new baseline. No iron studies. No active bleed - Order iron studies today. No need for ESA at this time   Nobie Putnam, [place], PGY-2  04/18/2015, 10:53 AM   Patient seen and examined, agree with above  note with above modifications. 54 year old BM patient of Dr. Sanda Klein at [facility] with progressive CKD mostly over the last 6 mos- felt to possibly have GN related to hep C but biopsy contraindicated due to coagulapathy.  He now presents with volume overload- weight up 16 pounds since 4/19 and hypertensive urgency.  I agree with management so far of diuresis which seems to be successful so far.  He is not uremic.  We will follow renal function and volume status.  Once more stable will address his future with possible placement of AVF this hosp   Corliss Parish, MD 04/18/2015

## 2015-04-18 NOTE — ED Provider Notes (Signed)
CSN: [id number]     Arrival date & time 04/18/15  0509 History   First MD Initiated Contact with Patient 04/18/15 [phone]     Chief Complaint  Patient presents with  . Shortness of Breath     (Consider location/radiation/quality/duration/timing/severity/associated sxs/prior Treatment) HPI Edwin Love is a 54 y.o. male with past medical history of hypertension, CHF presenting today with severe shortness of breath. Patient is unable to give his own history due to the acuity of his condition. Upon EMS arrival patient was 83% on room air. He was initially placed on CPAP given a nitroglycerin tablet. Patient was also given a nebulized treatment. Patient is currently denying any chest pain.  He states his breathing has improved since EMS picked him up.    Past Medical History  Diagnosis Date  . Hypertension   . Back pain   . Renal disorder     CKD stage 3 noted in 02/2014 [facility] & P  . Anemia 01/2014  . LVH (left ventricular hypertrophy) 02/2014    grade 2 diastolic dysfunction.   . Ankle fracture, left 10/2008    non surgical mgt.   . Thrombocytopenia 06/2014  . Coagulopathy 06/2014  . Esophageal varices   . GI bleed   . Cirrhosis    Past Surgical History  Procedure Laterality Date  . Eye surgery Left 1990's    Foreign body  . Inguinal hernia repair Right 02/08/2014    Procedure: HERNIA REPAIR INGUINAL INCARCERATED;  Surgeon: Edward Jolly, MD;  Location: [place];  Service: General;  Laterality: Right;  . Insertion of mesh Right 02/08/2014    Procedure: INSERTION OF MESH;  Surgeon: Edward Jolly, MD;  Location: [place];  Service: General;  Laterality: Right;  . Hernia repair    . Esophagogastroduodenoscopy N/A 06/24/2014    Procedure: ESOPHAGOGASTRODUODENOSCOPY (EGD);  Surgeon: Inda Castle, MD;  Location: [place];  Service: Endoscopy;  Laterality: N/A;  . Colonoscopy N/A 06/25/2014    Procedure: COLONOSCOPY;  Surgeon: Inda Castle, MD;  Location: [place];   Service: Endoscopy;  Laterality: N/A;  . Hemorrhoid surgery N/A 06/25/2014    Procedure: oversew of rectal varices;  Surgeon: Harl Bowie, MD;  Location: [place];  Service: General;  Laterality: N/A;  . Radiology with anesthesia N/A 07/02/2014    Procedure: RADIOLOGY WITH ANESTHESIA;  Surgeon: Jacqulynn Cadet, MD;  Location: [place];  Service: Radiology;  Laterality: N/A;   Family History  Problem Relation Age of Onset  . Diabetes Brother     Mother too  . Hypertension      family  . Sickle cell anemia Sister    History  Substance Use Topics  . Smoking status: Current Every Day Smoker -- 0.50 packs/day for 34 years    Types: Cigarettes  . Smokeless tobacco: Never Used     Comment: Smoke since 54 years old  . Alcohol Use: Yes     Comment: 40 oz every 2-3 days    Review of Systems  Unable to perform ROS: Acuity of condition      Allergies  Review of patient's allergies indicates no known allergies.  Home Medications   Prior to Admission medications   Medication Sig Start Date End Date Taking? Authorizing Provider  carvedilol (COREG) 6.25 MG tablet TAKE 1 TABLET TWICE A DAY WITH A MEAL Patient not taking: Reported on 04/18/2015 09/16/14   Inda Castle, MD   BP 189/101 mmHg  Pulse 78  Temp(Src) 97.5  F (36.4 C) (Axillary)  Resp 28  Ht 5\' 9"  (1.753 m)  SpO2 98% Physical Exam  Constitutional: He is oriented to person, place, and time. Vital signs are normal. He appears well-developed and well-nourished.  Non-toxic appearance. He does not appear ill. He appears distressed.  HENT:  Head: Normocephalic and atraumatic.  Nose: Nose normal.  Mouth/Throat: Oropharynx is clear and moist. No oropharyngeal exudate.  Eyes: Conjunctivae and EOM are normal. Pupils are equal, round, and reactive to light. No scleral icterus.  Neck: Normal range of motion. Neck supple. No tracheal deviation, no edema, no erythema and normal range of motion present. No thyroid mass and no  thyromegaly present.  Cardiovascular: Normal rate, regular rhythm, S1 normal, S2 normal, normal heart sounds, intact distal pulses and normal pulses.  Exam reveals no gallop and no friction rub.   No murmur heard. Pulses:      Radial pulses are 2+ on the right side, and 2+ on the left side.       Dorsalis pedis pulses are 2+ on the right side, and 2+ on the left side.  Pulmonary/Chest: He is in respiratory distress. He has wheezes. He has no rhonchi. He has rales.  Diffuse wheezing and rales, and respiratory expiratory. Increased work of breathing. Use of accessory muscles.  Abdominal: Soft. Normal appearance and bowel sounds are normal. He exhibits no distension, no ascites and no mass. There is no hepatosplenomegaly. There is no tenderness. There is no rebound, no guarding and no CVA tenderness.  Musculoskeletal: Normal range of motion. He exhibits edema. He exhibits no tenderness.  Lymphadenopathy:    He has no cervical adenopathy.  Neurological: He is alert and oriented to person, place, and time. He has normal strength. No cranial nerve deficit or sensory deficit.  Skin: Skin is warm and intact. No petechiae and no rash noted. He is diaphoretic. No erythema. No pallor.  Nursing note and vitals reviewed.   ED Course  Procedures (including critical care time) Labs Review Labs Reviewed  CBC WITH DIFFERENTIAL/PLATELET - Abnormal; Notable for the following:    RBC 3.54 (*)    Hemoglobin 11.2 (*)    HCT 32.0 (*)    Platelets 122 (*)    All other components within normal limits  BRAIN NATRIURETIC PEPTIDE - Abnormal; Notable for the following:    B Natriuretic Peptide 3344.2 (*)    All other components within normal limits  COMPREHENSIVE METABOLIC PANEL - Abnormal; Notable for the following:    CO2 16 (*)    Glucose, Bld 101 (*)    BUN 33 (*)    Creatinine, Ser 2.93 (*)    Calcium 8.6 (*)    Albumin 2.5 (*)    AST 43 (*)    GFR calc non Af Amer 23 (*)    GFR calc Af Amer 26 (*)     All other components within normal limits  LIPASE, BLOOD - Abnormal; Notable for the following:    Lipase 78 (*)    All other components within normal limits  PROTIME-INR - Abnormal; Notable for the following:    Prothrombin Time 15.6 (*)    All other components within normal limits  I-STAT CHEM 8, ED - Abnormal; Notable for the following:    BUN 32 (*)    Creatinine, Ser 3.10 (*)    Glucose, Bld 101 (*)    Hemoglobin 11.9 (*)    HCT 35.0 (*)    All other components within normal limits  I-STAT CG4 LACTIC ACID, ED  Randolm Idol, ED    Imaging Review Dg Chest Port 1 View  04/18/2015   CLINICAL DATA:  Shortness of breath tonight.  EXAM: PORTABLE CHEST - 1 VIEW  COMPARISON:  08/15/2014  FINDINGS: Shallow inspiration. Heart size and pulmonary vascularity are normal for technique. No focal airspace disease or consolidation. No blunting of costophrenic angles. No pneumothorax. Calcification of the aorta.  IMPRESSION: Shallow inspiration.  No evidence of active pulmonary disease.   Electronically Signed   By: Lucienne Capers M.D.   On: 04/18/2015 05:46     EKG Interpretation   Date/Time:  Monday Apr 18 2015 05:14:45 EDT Ventricular Rate:  87 PR Interval:  151 QRS Duration: 102 QT Interval:  399 QTC Calculation: 480 R Axis:   71 Text Interpretation:  Sinus rhythm Left ventricular hypertrophy Poor R  wave progression Confirmed by Glynn Octave [phone]) on 04/18/2015  5:35:38 AM      MDM   Final diagnoses:  SOB (shortness of breath)  Flash pulmonary edema    Patient presents emergency department for severe shortness of breath. He was immediately placed on BiPAP, and placed on a nitro drip. Patient was also given Vasotec and Lasix for treatment. His blood pressure continues to be high in the 180s to 190s. Nitro drip was increased to 200 g/hr.  I spoke with cardiology, Dr. Ron Parker, who evaluated the patient and does not believe the patient warrants cardiology admission.  He'll be admitted to [facility], cardiology will consult on the side. Bedside ultrasound reveals B lines as well, consistent with pulmonary edema. Chest x-ray reveals worsening effusions and bilateral lobes. Troponin is normal.  Emergency Ultrasound: Limited Thoracic Performed and interpreted by Dr Claudine Mouton Longitudinal view of anterior left and right lung fields in real-time with linear probe. Indication: respiratory distress Findings: positive lung sliding positive B lines Interpretation: No evidence of pneumothorax. Images electronically archived.      CRITICAL CARE Performed by: Everlene Balls   Total critical care time: 46min. Flash pulmonary edema  Critical care time was exclusive of separately billable procedures and treating other patients.  Critical care was necessary to treat or prevent imminent or life-threatening deterioration.  Critical care was time spent personally by me on the following activities: development of treatment plan with patient and/or surrogate as well as nursing, discussions with consultants, evaluation of patient's response to treatment, examination of patient, obtaining history from patient or surrogate, ordering and performing treatments and interventions, ordering and review of laboratory studies, ordering and review of radiographic studies, pulse oximetry and re-evaluation of patient's condition.     Everlene Balls, MD 04/18/15 [id number]

## 2015-04-18 NOTE — H&P (Addendum)
[facility] History and Physical  BENJERMAN MOLINELLI CWU:[id number] DOB: 04-06-1961 DOA: 04/18/2015   PCP: Patient couldn't tell me. Specialists: Followed by Dr. Lorrene Reid with nephrology  Chief Complaint: Shortness of breath this morning  HPI: Edwin Love is a 54 y.o. male with a past medical history of alcoholic cirrhosis, diastolic heart failure, chronic kidney disease, hypertension, noncompliance who woke up this morning to use the bathroom and developed shortness of breath. Apparently was noted to have wheezing initially and then subsequent he was noted to have crackles. He was placed on BiPAP. The patient denies any chest pain. History is very limited as the patient is a poor historian, along the fact that he is on a BiPAP currently. He does admit to some leg swelling but he tells that this is somewhat chronic. He denies any nausea, vomiting recently. He continues to smoke cigarettes. He states that he is feeling slightly better after getting Lasix. Once again History is very limited.  Home Medications: Prior to Admission medications   Medication Sig Start Date End Date Taking? Authorizing Provider  carvedilol (COREG) 6.25 MG tablet TAKE 1 TABLET TWICE A DAY WITH A MEAL Patient not taking: Reported on 04/18/2015 09/16/14   Inda Castle, MD    Allergies: No Known Allergies  Past Medical History: Past Medical History  Diagnosis Date  . Hypertension   . Back pain   . Renal disorder     CKD stage 3 noted in 02/2014 H & P  . Anemia 01/2014  . LVH (left ventricular hypertrophy) 02/2014    grade 2 diastolic dysfunction.   . Ankle fracture, left 10/2008    non surgical mgt.   . Thrombocytopenia 06/2014  . Coagulopathy 06/2014  . Esophageal varices   . GI bleed   . Cirrhosis     Past Surgical History  Procedure Laterality Date  . Eye surgery Left 1990's    Foreign body  . Inguinal hernia repair Right 02/08/2014    Procedure: HERNIA REPAIR INGUINAL INCARCERATED;   Surgeon: Edward Jolly, MD;  Location: [place];  Service: General;  Laterality: Right;  . Insertion of mesh Right 02/08/2014    Procedure: INSERTION OF MESH;  Surgeon: Edward Jolly, MD;  Location: [place];  Service: General;  Laterality: Right;  . Hernia repair    . Esophagogastroduodenoscopy N/A 06/24/2014    Procedure: ESOPHAGOGASTRODUODENOSCOPY (EGD);  Surgeon: Inda Castle, MD;  Location: [place];  Service: Endoscopy;  Laterality: N/A;  . Colonoscopy N/A 06/25/2014    Procedure: COLONOSCOPY;  Surgeon: Inda Castle, MD;  Location: [place];  Service: Endoscopy;  Laterality: N/A;  . Hemorrhoid surgery N/A 06/25/2014    Procedure: oversew of rectal varices;  Surgeon: Harl Bowie, MD;  Location: [place];  Service: General;  Laterality: N/A;  . Radiology with anesthesia N/A 07/02/2014    Procedure: RADIOLOGY WITH ANESTHESIA;  Surgeon: Jacqulynn Cadet, MD;  Location: [place];  Service: Radiology;  Laterality: N/A;    Social History:  History   Social History  . Marital Status: Single    Spouse Name: N/A  . Number of Children: N/A  . Years of Education: N/A   Occupational History  . Not on file.   Social History Main Topics  . Smoking status: Current Every Day Smoker -- 0.50 packs/day for 34 years    Types: Cigarettes  . Smokeless tobacco: Never Used     Comment: Smoke since 54 years old  . Alcohol Use: Yes  Comment: 40 oz every 2-3 days  . Drug Use: Yes     Comment: Cocaine 15 years ago, Marijuana 4 mo ago  . Sexual Activity: Not on file   Other Topics Concern  . Not on file   Social History Narrative    Family History:  Family History  Problem Relation Age of Onset  . Diabetes Brother     Mother too  . Hypertension      family  . Sickle cell anemia Sister      Review of Systems - unable to obtain due to acute respiratory failure  Physical Examination  Filed Vitals:   04/18/15 0715 04/18/15 0730 04/18/15 0750 04/18/15 0800  BP: 189/101  192/99 196/95   Pulse: 78 75 79 79  Temp:      TempSrc:      Resp: 28   21  Height:      SpO2: 98% 100% 100% 100%    BP 196/95 mmHg  Pulse 79  Temp(Src) 97.5 F (36.4 C) (Axillary)  Resp 21  Ht $R'5\' 9"'zL$  (1.753 m)  SpO2 100%  General appearance: alert, appears stated age, distracted, no distress and moderately obese Head: Normocephalic, without obvious abnormality, atraumatic Eyes: conjunctivae/corneas clear. PERRL, EOM's intact.  Throat: BiPAP is present Neck: Limited exam as the patient is on BiPAP. No obvious masses.  Resp: Decreased air entry at the bases with the coarse breath sounds and crackles. Few scattered wheezing. Cardio: regular rate and rhythm, S1, S2 normal, no murmur, click, rub or gallop GI: soft, non-tender; bowel sounds normal; no masses,  no organomegaly Extremities: extremities normal, atraumatic, no cyanosis or edema Pulses: 2+ and symmetric Skin: Skin color, texture, turgor normal. No rashes or lesions Lymph nodes: Cervical, supraclavicular, and axillary nodes normal. Neurologic: No obvious focal deficits.  Laboratory Data: Results for orders placed or performed during the hospital encounter of 04/18/15 (from the past 48 hour(s))  CBC with Differential/Platelet     Status: Abnormal   Collection Time: 04/18/15  5:32 AM  Result Value Ref Range   WBC 8.2 4.0 - 10.5 K/uL   RBC 3.54 (L) 4.22 - 5.81 MIL/uL   Hemoglobin 11.2 (L) 13.0 - 17.0 g/dL   HCT 32.0 (L) 39.0 - 52.0 %   MCV 90.4 78.0 - 100.0 fL   MCH 31.6 26.0 - 34.0 pg   MCHC 35.0 30.0 - 36.0 g/dL   RDW 13.4 11.5 - 15.5 %   Platelets 122 (L) 150 - 400 K/uL   Neutrophils Relative % 73 43 - 77 %   Neutro Abs 6.1 1.7 - 7.7 K/uL   Lymphocytes Relative 14 12 - 46 %   Lymphs Abs 1.1 0.7 - 4.0 K/uL   Monocytes Relative 8 3 - 12 %   Monocytes Absolute 0.6 0.1 - 1.0 K/uL   Eosinophils Relative 4 0 - 5 %   Eosinophils Absolute 0.3 0.0 - 0.7 K/uL   Basophils Relative 1 0 - 1 %   Basophils Absolute 0.1 0.0  - 0.1 K/uL  Brain natriuretic peptide     Status: Abnormal   Collection Time: 04/18/15  5:32 AM  Result Value Ref Range   B Natriuretic Peptide 3344.2 (H) 0.0 - 100.0 pg/mL  Comprehensive metabolic panel     Status: Abnormal   Collection Time: 04/18/15  5:32 AM  Result Value Ref Range   Sodium 136 135 - 145 mmol/L   Potassium 3.9 3.5 - 5.1 mmol/L   Chloride 108 101 - 111  mmol/L   CO2 16 (L) 22 - 32 mmol/L   Glucose, Bld 101 (H) 70 - 99 mg/dL   BUN 33 (H) 6 - 20 mg/dL   Creatinine, Ser 2.93 (H) 0.61 - 1.24 mg/dL   Calcium 8.6 (L) 8.9 - 10.3 mg/dL   Total Protein 7.4 6.5 - 8.1 g/dL   Albumin 2.5 (L) 3.5 - 5.0 g/dL   AST 43 (H) 15 - 41 U/L   ALT 18 17 - 63 U/L   Alkaline Phosphatase 125 38 - 126 U/L   Total Bilirubin 0.9 0.3 - 1.2 mg/dL   GFR calc non Af Amer 23 (L) >60 mL/min   GFR calc Af Amer 26 (L) >60 mL/min    Comment: (NOTE) The eGFR has been calculated using the CKD EPI equation. This calculation has not been validated in all clinical situations. eGFR's persistently <60 mL/min signify possible Chronic Kidney Disease.    Anion gap 12 5 - 15  Lipase, blood     Status: Abnormal   Collection Time: 04/18/15  5:32 AM  Result Value Ref Range   Lipase 78 (H) 22 - 51 U/L  Protime-INR     Status: Abnormal   Collection Time: 04/18/15  5:32 AM  Result Value Ref Range   Prothrombin Time 15.6 (H) 11.6 - 15.2 seconds   INR 1.23 0.00 - 1.49  I-stat troponin, ED     Status: None   Collection Time: 04/18/15  5:35 AM  Result Value Ref Range   Troponin i, poc 0.03 0.00 - 0.08 ng/mL   Comment 3            Comment: Due to the release kinetics of cTnI, a negative result within the first hours of the onset of symptoms does not rule out myocardial infarction with certainty. If myocardial infarction is still suspected, repeat the test at appropriate intervals.   I-Stat CG4 Lactic Acid, ED     Status: None   Collection Time: 04/18/15  5:38 AM  Result Value Ref Range   Lactic Acid,  Venous 1.15 0.5 - 2.0 mmol/L  I-stat chem 8, ed     Status: Abnormal   Collection Time: 04/18/15  5:38 AM  Result Value Ref Range   Sodium 140 135 - 145 mmol/L   Potassium 4.0 3.5 - 5.1 mmol/L   Chloride 109 101 - 111 mmol/L   BUN 32 (H) 6 - 20 mg/dL   Creatinine, Ser 3.10 (H) 0.61 - 1.24 mg/dL   Glucose, Bld 101 (H) 70 - 99 mg/dL   Calcium, Ion 1.15 1.12 - 1.23 mmol/L   TCO2 15 0 - 100 mmol/L   Hemoglobin 11.9 (L) 13.0 - 17.0 g/dL   HCT 35.0 (L) 39.0 - 52.0 %    Radiology Reports: Dg Chest Port 1 View  04/18/2015   CLINICAL DATA:  Shortness of breath tonight.  EXAM: PORTABLE CHEST - 1 VIEW  COMPARISON:  08/15/2014  FINDINGS: Shallow inspiration. Heart size and pulmonary vascularity are normal for technique. No focal airspace disease or consolidation. No blunting of costophrenic angles. No pneumothorax. Calcification of the aorta.  IMPRESSION: Shallow inspiration.  No evidence of active pulmonary disease.   Electronically Signed   By: Lucienne Capers M.D.   On: 04/18/2015 05:46    Electrocardiogram: EKG shows sinus rhythm at 87 bpm. Normal axis. Intervals are normal. Poor R wave progression. Subtle ST depression in V5, V6. No T-wave changes.  Problem List  Principal Problem:   Acute respiratory failure  with hypoxia Active Problems:   CKD (chronic kidney disease) stage 3, GFR 30-59 ml/min   Tobacco abuse   Alcoholic cirrhosis   Esophageal varices without bleeding   Hepatitis C   S/P TIPS (transjugular intrahepatic portosystemic shunt)   Flash pulmonary edema   Assessment: This is a 54 year old African-American male with past medical history as stated earlier, who presents with sudden onset shortness of breath. Chest x-ray suggests pulmonary edema. He was initially wheezing and had a saturations of 83% on room air. His acute respiratory failure, most likely due to fluid overload, although superimposed bronchitis is not ruled out. Could've also been precipitated by hypertensive  emergency.  Plan: #1 acute respiratory failure with hypoxia: Probably due to fluid overload. Continue with BiPAP. Agree with IV Lasix, which will be ordered. Monitor closely in step down unit.  #2 Pulmonary edema/fluid overload, possibly due to a combination of acute diastolic heart failure, and CKD and hypertensive emergency: He'll be placed on high dose Lasix. Nephrology and cardiology have been consulted. Cycle troponins. Last echocardiogram is from March 2015, which showed a normal systolic function with grade 2 diastolic dysfunction. We'll defer to cardiology to obtain repeat if needed. BNP is noted to be elevated. Check TSH.  #3 chronic kidney disease, stage III: Creatinine appears to be worse compared to back in November. He has had acute renal failure in the past. He has lower extremity edema. Will get venous Doppler. Nephrology has been consulted. Lasix as mentioned above. Check UA. Monitor urine output. Renal ultrasound done recently in March of this year which showed medical renal disease.  #4 Hypertensive emergency: Blood pressure was significantly high when he came in. He is currently on a nitroglycerin infusion, which will be continued for blood pressure control as well as fluid overload. Monitor closely.  #5 history of hepatitis C with alcoholic cirrhosis and esophageal varices, status post TIPS: He's had GI bleeding episodes in the past. He appears to be noncompliant. Tried to verify Alcohol intake but patient was vague. Place on Ativan withdrawal protocol.  #6 Normocytic anemia: Most likely due to chronic kidney disease. Continue to monitor.  DVT Prophylaxis: Teds stockings for now. If venous Doppler is negative for DVT, SCDs can be ordered. No heparin products due to episodes of significant GI bleeding in past 1 year Code Status: Full code Family Communication: Discussed with the patient. No family at bedside  Disposition Plan: Admit to stepdown   Further management decisions  will depend on results of further testing and patient's response to treatment.   [facility]  [facility] Pager [phone]  If 7PM-7AM, please contact night-coverage [email or web address] Password [email or web address]  04/18/2015, 8:27 AM

## 2015-04-18 NOTE — Consult Note (Addendum)
CARDIOLOGY CONSULT NOTE   Patient ID: Edwin Love MRN: [id number] DOB/AGE: August 05, 1961 54 y.o.  Admit Date: 04/18/2015  Primary Physician: Jacqualine Mau Cardiologist:  Rodney Langton  Clinical Summary Edwin Love is a 54 y.o.male  who presents with shortness of breath and hypoxia. Chest x-ray is most likely compatible with CHF. The patient has known significant renal insufficiency that is worse today. There is a history of noncompliance. He also has alcoholic cirrhosis and history of hepatitis. He has not been having any significant chest pain. EKG does not show any acute changes.   No Known Allergies  Medications Scheduled Medications: . furosemide  80 mg Intravenous Once     Infusions: . nitroGLYCERIN 200 mcg/min (04/18/15 0622)     PRN Medications:     Past Medical History  Diagnosis Date  . Hypertension   . Back pain   . Renal disorder     CKD stage 3 noted in 02/2014 H & P  . Anemia 01/2014  . LVH (left ventricular hypertrophy) 02/2014    grade 2 diastolic dysfunction.   . Ankle fracture, left 10/2008    non surgical mgt.   . Thrombocytopenia 06/2014  . Coagulopathy 06/2014  . Esophageal varices   . GI bleed   . Cirrhosis     Past Surgical History  Procedure Laterality Date  . Eye surgery Left 1990's    Foreign body  . Inguinal hernia repair Right 02/08/2014    Procedure: HERNIA REPAIR INGUINAL INCARCERATED;  Surgeon: Edward Jolly, MD;  Location: [place];  Service: General;  Laterality: Right;  . Insertion of mesh Right 02/08/2014    Procedure: INSERTION OF MESH;  Surgeon: Edward Jolly, MD;  Location: [place];  Service: General;  Laterality: Right;  . Hernia repair    . Esophagogastroduodenoscopy N/A 06/24/2014    Procedure: ESOPHAGOGASTRODUODENOSCOPY (EGD);  Surgeon: Inda Castle, MD;  Location: [place];  Service: Endoscopy;  Laterality: N/A;  . Colonoscopy N/A 06/25/2014    Procedure: COLONOSCOPY;  Surgeon: Inda Castle,  MD;  Location: [place];  Service: Endoscopy;  Laterality: N/A;  . Hemorrhoid surgery N/A 06/25/2014    Procedure: oversew of rectal varices;  Surgeon: Harl Bowie, MD;  Location: [place];  Service: General;  Laterality: N/A;  . Radiology with anesthesia N/A 07/02/2014    Procedure: RADIOLOGY WITH ANESTHESIA;  Surgeon: Jacqulynn Cadet, MD;  Location: [place];  Service: Radiology;  Laterality: N/A;    Family History  Problem Relation Age of Onset  . Diabetes Brother     Mother too  . Hypertension      family  . Sickle cell anemia Sister     Social History Edwin Love reports that he has been smoking Cigarettes.  He has a 17 pack-year smoking history. He has never used smokeless tobacco. Edwin Love reports that he drinks alcohol.  Review of Systems  Review of systems is limited as the patient is on BiPAP. He is not having any significant chest pain at this time.  Physical Examination Blood pressure 196/95, pulse 79, temperature 97.5 F (36.4 C), temperature source Axillary, resp. rate 21, height 5\' 9"  (1.753 m), SpO2 100 %. No intake or output data in the 24 hours ending 04/18/15 0805  Patient currently is on BiPAP. He is more comfortable and feeling somewhat improved. Head is atraumatic. Sclera and conjunctiva are normal. Lungs reveal scattered rhonchi. Cardiac exam reveals S1 and S2. The abdomen is soft. There  is 1+ peripheral edema. There are no musculoskeletal deformities. There are no skin rashes. Neurologic cannot be tested at this time.  Prior Cardiac Testing/Procedures  Lab Results  Basic Metabolic Panel:  Recent Labs Lab 04/18/15 0532 04/18/15 0538  NA 136 140  K 3.9 4.0  CL 108 109  CO2 16*  --   GLUCOSE 101* 101*  BUN 33* 32*  CREATININE 2.93* 3.10*  CALCIUM 8.6*  --     Liver Function Tests:  Recent Labs Lab 04/18/15 0532  AST 43*  ALT 18  ALKPHOS 125  BILITOT 0.9  PROT 7.4  ALBUMIN 2.5*    CBC:  Recent Labs Lab 04/18/15 0532  04/18/15 0538  WBC 8.2  --   NEUTROABS 6.1  --   HGB 11.2* 11.9*  HCT 32.0* 35.0*  MCV 90.4  --   PLT 122*  --     Cardiac Enzymes: No results for input(s): CKTOTAL, CKMB, CKMBINDEX, TROPONINI in the last 168 hours.  BNP: Invalid input(s): POCBNP   Radiology: Dg Chest Port 1 View  04/18/2015   CLINICAL DATA:  Shortness of breath tonight.  EXAM: PORTABLE CHEST - 1 VIEW  COMPARISON:  08/15/2014  FINDINGS: Shallow inspiration. Heart size and pulmonary vascularity are normal for technique. No focal airspace disease or consolidation. No blunting of costophrenic angles. No pneumothorax. Calcification of the aorta.  IMPRESSION: Shallow inspiration.  No evidence of active pulmonary disease.   Electronically Signed   By: Lucienne Capers M.D.   On: 04/18/2015 05:46     ECG:  I have reviewed today's EKG and prior EKGs. He has increased voltage consistent with LVH. There is decreased anterior R-wave progression that is unchanged.  Telemetry:   I have reviewed telemetry today Apr 18, 2015. There is sinus rhythm.   Impression and Recommendations     Acute respiratory failure with hypoxia     This is probably secondary to pulmonary edema. I cannot rule out the possibility of coexisting bronchitis    CKD (chronic kidney disease) stage 3, GFR 30-59 ml/min     Renal function is worse today. Hopefully it will improve with control of blood pressure and diuresis.     Tobacco abuse   Alcoholic cirrhosis   Esophageal varices without bleeding   Hepatitis C   S/P TIPS (transjugular intrahepatic portosystemic shunt)      Hypertensive emergency    Blood pressure was high with admission. Certainly some of this could be related to the respiratory failure. Optimal blood pressure control will be necessary.    Acute on chronic diastolic heart failure     At this point the treatment will be oxygenation and careful diuresis. By history he has a normal ejection fraction in 2015. I feel that we should  repeat his echo to see if there is been any major change. I will request this for tomorrow. I have requested this without contrast. He has a shunt. I do not know that this would increase his risk, however we need to be careful.  Daryel November, MD  04/18/2015, 8:05 AM

## 2015-04-18 NOTE — Progress Notes (Signed)
Earlie Lou, G MD regarding patients ongoing elevated BP ranging from the 374-451 systolic.  Patients nitroglycerin gtt is at maximum of 200 mcg an hour. Physician states he is comfortable with patient maintaining a systolic BP in the 460-479.  Will continue to monitor.

## 2015-04-19 ENCOUNTER — Inpatient Hospital Stay (HOSPITAL_COMMUNITY): Payer: Medicaid Other

## 2015-04-19 DIAGNOSIS — N183 Chronic kidney disease, stage 3 (moderate): Secondary | ICD-10-CM

## 2015-04-19 DIAGNOSIS — I509 Heart failure, unspecified: Secondary | ICD-10-CM

## 2015-04-19 DIAGNOSIS — M7989 Other specified soft tissue disorders: Secondary | ICD-10-CM

## 2015-04-19 DIAGNOSIS — R0602 Shortness of breath: Secondary | ICD-10-CM

## 2015-04-19 DIAGNOSIS — I10 Essential (primary) hypertension: Secondary | ICD-10-CM

## 2015-04-19 LAB — BASIC METABOLIC PANEL
ANION GAP: 12 (ref 5–15)
BUN: 38 mg/dL — ABNORMAL HIGH (ref 6–20)
CO2: 18 mmol/L — ABNORMAL LOW (ref 22–32)
Calcium: 8.5 mg/dL — ABNORMAL LOW (ref 8.9–10.3)
Chloride: 110 mmol/L (ref 101–111)
Creatinine, Ser: 2.95 mg/dL — ABNORMAL HIGH (ref 0.61–1.24)
GFR calc Af Amer: 26 mL/min — ABNORMAL LOW (ref >60)
GFR calc non Af Amer: 23 mL/min — ABNORMAL LOW (ref >60)
Glucose, Bld: 93 mg/dL (ref 70–99)
Potassium: 3.2 mmol/L — ABNORMAL LOW (ref 3.5–5.1)
SODIUM: 140 mmol/L (ref 135–145)

## 2015-04-19 LAB — CBC
HEMATOCRIT: 27 % — AB (ref 39.0–52.0)
HEMOGLOBIN: 9.4 g/dL — AB (ref 13.0–17.0)
MCH: 30.9 pg (ref 26.0–34.0)
MCHC: 34.8 g/dL (ref 30.0–36.0)
MCV: 88.8 fL (ref 78.0–100.0)
Platelets: 87 10*3/uL — ABNORMAL LOW (ref 150–400)
RBC: 3.04 MIL/uL — AB (ref 4.22–5.81)
RDW: 13.2 % (ref 11.5–15.5)
WBC: 7 10*3/uL (ref 4.0–10.5)

## 2015-04-19 LAB — PARATHYROID HORMONE, INTACT (NO CA): PTH: 86 pg/mL — ABNORMAL HIGH (ref 15–65)

## 2015-04-19 MED ORDER — POTASSIUM CHLORIDE CRYS ER 20 MEQ PO TBCR
40.0000 meq | EXTENDED_RELEASE_TABLET | Freq: Two times a day (BID) | ORAL | Status: AC
  Administered 2015-04-19 (×2): 40 meq via ORAL
  Filled 2015-04-19 (×2): qty 2

## 2015-04-19 MED ORDER — FUROSEMIDE 10 MG/ML IJ SOLN
80.0000 mg | Freq: Two times a day (BID) | INTRAMUSCULAR | Status: AC
  Administered 2015-04-19: 80 mg via INTRAVENOUS

## 2015-04-19 MED ORDER — CARVEDILOL 12.5 MG PO TABS
12.5000 mg | ORAL_TABLET | Freq: Two times a day (BID) | ORAL | Status: DC
  Administered 2015-04-19 – 2015-04-20 (×2): 12.5 mg via ORAL
  Filled 2015-04-19 (×4): qty 1

## 2015-04-19 MED ORDER — CARVEDILOL 6.25 MG PO TABS
6.2500 mg | ORAL_TABLET | Freq: Two times a day (BID) | ORAL | Status: DC
  Administered 2015-04-19: 6.25 mg via ORAL
  Filled 2015-04-19 (×3): qty 1

## 2015-04-19 MED ORDER — SODIUM BICARBONATE 650 MG PO TABS
650.0000 mg | ORAL_TABLET | Freq: Two times a day (BID) | ORAL | Status: DC
  Administered 2015-04-19 – 2015-04-21 (×5): 650 mg via ORAL
  Filled 2015-04-19 (×6): qty 1

## 2015-04-19 NOTE — Progress Notes (Signed)
Bilateral Lower Ext. Venous Duplex Completed. Negative for DVT or SVT, Lorelei Heikkila, BS, RDMS, RVT

## 2015-04-19 NOTE — Progress Notes (Signed)
[facility] - Nephrology Progress Note  Subjective:  No acute events overnight. Patient able to come off of BiPAP by 0400, tolerating RA well >95%.  Today patient resting in bed, he is feeling "a lot better", breathing well without BiPAP. Tolerating PO. No new complaints at this time. - Good UOP in response to diuresis yesterday, -5.45L. - Denies any chest pain or tightness, abdominal pain, nausea / vomiting  Objective:  Vital signs in last 24 hours:  Temp:  [97.9 F (36.6 C)-98.6 F (37 C)] 98.3 F (36.8 C) (05/10 0822) Pulse Rate:  [74-85] 81 (05/10 0400) Resp:  [14-39] 17 (05/10 0400) BP: (145-193)/(67-110) 166/89 mmHg (05/10 0400) SpO2:  [94 %-100 %] 94 % (05/10 0400) FiO2 (%):  [40 %] 40 % (05/09 1900) Weight:  [208 lb 1.8 oz (94.4 kg)-218 lb 7.6 oz (99.1 kg)] 208 lb 1.8 oz (94.4 kg) (05/10 0322) Weight change:   Intake/Output: I/O last 3 completed shifts: In: 1331 [I.V.:1331] Out: 1517 [Urine:5450]  Intake/Output this shift:  Total I/O In: -  Out: 500 [Urine:500]  EXAM: General: chronically ill-appearing 47 yr AAM, appears comfortable resting in bed, cooperative, NAD Neck: supple, non-tender, no JVD Heart: RRR, no murmurs heard Lungs: Significantly improved crackles now with only mild bibasilar, good air movement, non-labored. Abdomen: obese, soft, non-tender and non-distended, no focal masses, +active BS Extremities: Significantly improved b/l lower ext edema now trace, intact peripheral pulses Skin: warm, dry Neuro: awake, alert, oriented, grossly non-focal  Lab Results:  Recent Labs  04/18/15 0532 04/18/15 0538 04/19/15 0244  WBC 8.2  --  7.0  HGB 11.2* 11.9* 9.4*  HCT 32.0* 35.0* 27.0*  PLT 122*  --  87*   BMET  Recent Labs  04/18/15 0532 04/18/15 0538 04/19/15 0244  NA 136 140 140  K 3.9 4.0 3.2*  CL 108 109 110  CO2 16*  --  18*  GLUCOSE 101* 101* 93  BUN 33* 32* 38*  CREATININE 2.93* 3.10* 2.95*  CALCIUM 8.6*  --  8.5*    LFT  Recent Labs  04/18/15 0532  PROT 7.4  ALBUMIN 2.5*  AST 43*  ALT 18  ALKPHOS 125  BILITOT 0.9   PT/INR  Recent Labs  04/18/15 0532  LABPROT 15.6*  INR 1.23   Hepatitis Panel No results for input(s): HEPBSAG, HCVAB, HEPAIGM, HEPBIGM in the last 72 hours. PTH: Lab Results  Component Value Date   PTH 86* 04/18/2015   CALCIUM 8.5* 04/19/2015   CAION 1.15 04/18/2015   PHOS 3.2 06/29/2014    Studies/Results: Dg Chest Port 1 View  04/18/2015   CLINICAL DATA:  Shortness of breath tonight.  EXAM: PORTABLE CHEST - 1 VIEW  COMPARISON:  08/15/2014  FINDINGS: Shallow inspiration. Heart size and pulmonary vascularity are normal for technique. No focal airspace disease or consolidation. No blunting of costophrenic angles. No pneumothorax. Calcification of the aorta.  IMPRESSION: Shallow inspiration.  No evidence of active pulmonary disease.   Electronically Signed   By: Lucienne Capers M.D.   On: 04/18/2015 05:46    Background: 16 yr M admitted on 5/9 with acute decomp dCHF with 2/2 acute pulm edema leading to acute resp failure (req BiPAP). Additionally with HTN Emergency (BP >220/110). CXR with vasc congestion. Cardiology consulted. 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 Prior work-up with negative SPEP / UPEP, UPC 6.7g, neg ANA / ANCA, nml Complete, neg cryoglobulins).  Significant PMH alcoholic cirrhosis with Hep C, dCHF (Grade 2), CKD-III, HTN (poorly controlled, ?med adherence), and active tobacco and alcohol abuse.  Assessment/Plan: 1. 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 - Followed by Dr. Lorrene Reid ([facility]). Last Renal US (02/2015 - R-12.2cm, L-12.9cm, inc b/l echogenicity, no obstruction or mass. - Baseline SCr near 3.0 (last checked 4/19 at [facility]) - Slight improved SCr 3.10 to 2.95 (on admit 5/9). BUN 38. Cautious but seems unlikely SCr may rise vs stabilize near new b/l with underlying progressive GN. - Reduce Lasix IV to 65m BID (from TID). Re-evaluate fluid status in AM. Likely transition to PO tomorrow. - Improved fluid status approaching euvolemic, good diuresis response with -5.45L UOP (neg -4.6L out), wt down -10 lbs to 208, seems near EDW 200-205 lbs - Daily BMET, Strict I/O, Daily wt - PTH 86 (mildly elevated), not consistent with >150-300 range - Resume home sodium bicarb 6527mPO BID - for chronic met acidosis (Bicarb 16 to 18 today) - Regarding progressive GN (unclear etiology, ?Hep C), not good candidate for renal biopsy - Discussed with patient that given significant improvement, does not seem to be progressing towards possible HD during this episode.  We will defer future discussion of potential HD to outpatient with [facility] / [facility] during current hospitalization unless significant change.  2. HTN Emergency / Uncontrolled - Improved. - On admit BP 224/116. Cardiology following. Remains on Nitro gtt, goal to wean off Nitro gtt, transition to POs - Hold further doses ACEi / ARB- started coreg- may need another agent if does not go down further with diuresis  3. Acute on chronic Diastolic CHF / Acute Pulmonary Edema - Cardiology following. Last ECHO 02/2014 (LVEF 5556-38%Grd 2 Diastolic dysfxn). ECHO ordered for today.  4. Acute Hypoxic Respiratory Failure (req BiPap), d/t #3 - Per Primary/Cards - On BiPAP. Lasix IV diuresis.  5. Alcoholic Cirrhosis, with h/o Hep C, complicated eso/rectal varices with h/o hemorrhage - s/p rectal varceal surgery and TIPs  procedure, active EtOH use. No active bleeding at this time.  6. Chronic Anemia, in CKD with prior h/o acute variceal bleed - Hgb 11.9, stable, ?new baseline. No iron studies. No active bleed - 5/9 Iron studies: iron 97, TIBC 305, T-sat 32, ferritin 55, folate / B12 - all normal range. No indication for Aranesp or IV Fe at this time.  7. Hypokalemia - 4.0 to 3.2 with continued diuresis, replace as needed. Ordered PO 40 mEq BID today.  AlNobie PutnamDOLaderaPGY-2 (Currently working with [facility] - Renal Service) 8:23 AM   Patient seen and examined, agree with above note with above modifications. Looks much better this AM- diuresed 5 liters plus.  Creatinine unchanged- not uremic.  Continue with fairly aggressive diuresis today for blood pressure control- hgb dropping- low K as well- replete.   KeCorliss ParishMD 04/19/2015

## 2015-04-19 NOTE — Progress Notes (Signed)
PROGRESS NOTE    LA DIBELLA MMH:[id number] DOB: 08-Apr-1961 DOA: 04/18/2015 PCP: Jacqualine Mau  Primary Nephrologist: Dr. Jamal Maes  HPI/Brief narrative 54 year old male with history of alcoholic cirrhosis with portal hypertension/esophageal varices/GI bleed/TIPS procedure, chronic diastolic CHF, HTN, stage III chronic kidney disease, tobacco abuse, hepatitis C, chronic anemia, noncompliant with medications, admitted to [facility] stepdown unit on 04/18/15 with acute onset of dyspnea secondary to acute respiratory failure with hypoxia from acute on chronic diastolic CHF and hypertensive emergency. Cardiology and nephrology are consulting.  Assessment/Plan:  Acute respiratory failure with hypoxia - O2 saturations were 83% on room air on admission - Secondary to volume overload/acute on chronic diastolic CHF - Was on BiPAP overnight - Improved - Wean off oxygen as tolerated.  Acute on chronic diastolic CHF - Precipitated by noncompliance to medications, acute on chronic kidney disease and hypertensive emergency. - Treated with IV Lasix, high-dose with good diuresis (-3.3 L since admission) and improved. - Cardiology and nephrology input appreciated. - Minimally elevated troponin may be from demand ischemia related to decompensated CHF, hypertensive emergency and chronic kidney disease. Flat trend. Follow 2-D echo - Last echo 02/2014: LVEF 55-60 percent with grade 2 diastolic dysfunction.  Hypertensive emergency - Secondary to noncompliance with medications - Started on nitroglycerin drip and titrated to maximum 200 g on 5/10 - Will start carvedilol 6.25 MG twice a day - Continue IV Lasix and diuresis will also help blood pressures. - Titrate down NTG as tolerated - We'll have to adjust antihypertensives. - Hold ACEI/ARB  Hypokalemia - Replace and follow BMP  Chronic anemia - No reported bleeding - Hemoglobin has dropped from 11.2 > 9.4 in 24 hours - Follow CBC in  a.m. and transfuse if hemoglobin less than 8 g per DL.  Stage III-IV chronic kidney disease - Followed by Dr. Lorrene Reid as outpatient - Baseline creatinine near 3 but has been progressing fast in the last 6 months related to progressive GN of unclear etiology-? Hepatitis C. - Creatinine stable at 2.9  Alcoholic & hep C cirrhosis complicated by esophageal/rectal varices with history of GI bleed & TIPS procedure - Ongoing alcohol abuse >cessation counseled  Alcohol abuse - CIWA protocol  Thrombocytopenia - Secondary to alcohol abuse and cirrhosis - Follow CBCs   Code Status: Full Family Communication: None at bedside Disposition Plan: DC home when medically stable, possibly in 2-3 days. Continue treatment in stepdown unit for additional 24 hours due to ongoing NTG drip. Also at risk for decline.   Consultants:  Nephrology  Cardiology  Procedures:  BiPAP when necessary  Antibiotics:  None   Subjective: Feels much better. Denies dyspnea. Asking for something to eat (had been nothing by mouth due to BiPAP). Denies chest pain. As per nursing, no acute events.  Objective: Filed Vitals:   04/19/15 0400 04/19/15 0700 04/19/15 0822 04/19/15 1132  BP: 166/89 170/88    Pulse: 81 82    Temp:   98.3 F (36.8 C) 98.3 F (36.8 C)  TempSrc:   Oral Oral  Resp: 17 23    Height:      Weight:      SpO2: 94% 100%      Intake/Output Summary (Last 24 hours) at 04/19/15 1142 Last data filed at 04/19/15 1137  Gross per 24 hour  Intake   1691 ml  Output   5000 ml  Net  -3309 ml   Filed Weights   04/18/15 0919 04/19/15 0322  Weight: 99.1 kg (218 lb 7.6  oz) 94.4 kg (208 lb 1.8 oz)     Exam:  General exam: Pleasant middle-aged male lying comfortably propped up in bed. Respiratory system: Few fine basal crackles but otherwise clear to auscultation. No increased work of breathing. Cardiovascular system: S1 & S2 heard, RRR. No JVD, murmurs, gallops, clicks or pedal edema.  Telemetry: Sinus rhythm Gastrointestinal system: Abdomen is nondistended, soft and nontender. Normal bowel sounds heard. Central nervous system: Alert and oriented. No focal neurological deficits. Extremities: Symmetric 5 x 5 power.   Data Reviewed: Basic Metabolic Panel:  Recent Labs Lab 04/18/15 0532 04/18/15 0538 04/19/15 0244  NA 136 140 140  K 3.9 4.0 3.2*  CL 108 109 110  CO2 16*  --  18*  GLUCOSE 101* 101* 93  BUN 33* 32* 38*  CREATININE 2.93* 3.10* 2.95*  CALCIUM 8.6*  --  8.5*   Liver Function Tests:  Recent Labs Lab 04/18/15 0532  AST 43*  ALT 18  ALKPHOS 125  BILITOT 0.9  PROT 7.4  ALBUMIN 2.5*    Recent Labs Lab 04/18/15 0532  LIPASE 78*   No results for input(s): AMMONIA in the last 168 hours. CBC:  Recent Labs Lab 04/18/15 0532 04/18/15 0538 04/19/15 0244  WBC 8.2  --  7.0  NEUTROABS 6.1  --   --   HGB 11.2* 11.9* 9.4*  HCT 32.0* 35.0* 27.0*  MCV 90.4  --  88.8  PLT 122*  --  87*   Cardiac Enzymes:  Recent Labs Lab 04/18/15 1001 04/18/15 1622 04/18/15 2050  TROPONINI 0.05* 0.05* 0.04*   BNP (last 3 results) No results for input(s): PROBNP in the last 8760 hours. CBG:  Recent Labs Lab 04/18/15 1604  GLUCAP 99    Recent Results (from the past 240 hour(s))  MRSA PCR Screening     Status: None   Collection Time: 04/18/15 11:08 AM  Result Value Ref Range Status   MRSA by PCR NEGATIVE NEGATIVE Final    Comment:        The GeneXpert MRSA Assay (FDA approved for NASAL specimens only), is one component of a comprehensive MRSA colonization surveillance program. It is not intended to diagnose MRSA infection nor to guide or monitor treatment for MRSA infections.           Studies: Dg Chest Port 1 View  04/18/2015   CLINICAL DATA:  Shortness of breath tonight.  EXAM: PORTABLE CHEST - 1 VIEW  COMPARISON:  08/15/2014  FINDINGS: Shallow inspiration. Heart size and pulmonary vascularity are normal for technique. No focal  airspace disease or consolidation. No blunting of costophrenic angles. No pneumothorax. Calcification of the aorta.  IMPRESSION: Shallow inspiration.  No evidence of active pulmonary disease.   Electronically Signed   By: Lucienne Capers M.D.   On: 04/18/2015 05:46        Scheduled Meds: . carvedilol  6.25 mg Oral BID WC  . folic acid  1 mg Oral Daily  . furosemide  80 mg Intravenous BID  . multivitamin with minerals  1 tablet Oral Daily  . potassium chloride  40 mEq Oral BID  . sodium bicarbonate  650 mg Oral BID  . sodium chloride  3 mL Intravenous Q12H  . thiamine  100 mg Oral Daily   Continuous Infusions: . nitroGLYCERIN 200 mcg/min (04/19/15 0856)    Principal Problem:   Acute respiratory failure with hypoxia Active Problems:   CKD (chronic kidney disease) stage 3, GFR 30-59 ml/min   Tobacco abuse  Alcoholic cirrhosis   Esophageal varices without bleeding   Hepatitis C   S/P TIPS (transjugular intrahepatic portosystemic shunt)   Flash pulmonary edema   Hypertensive emergency   Acute on chronic diastolic heart failure    Time spent: 27 minutes    Lauryn Lizardi, MD, FACP, FHM. [facility] Pager [phone]  If 7PM-7AM, please contact night-coverage [email or web address] Password [email or web address] 04/19/2015, 11:42 AM    LOS: 1 day

## 2015-04-19 NOTE — Progress Notes (Signed)
  Echocardiogram 2D Echocardiogram has been performed.  Edwin Love 04/19/2015, 2:58 PM

## 2015-04-19 NOTE — Care Management Note (Signed)
Case Management Note  Patient Details  Name: Edwin Love MRN: [id number] Date of Birth: [birth year]  Subjective/Objective:                  From home with family admitted with dyspnea, CHF, AND HTN urgency.  Action/Plan:  Return to home when medically stable. CM to f/u with d/c disposition. Expected Discharge Date:                  Expected Discharge Plan:  [place]  In-House Referral:     Discharge planning Services  CM Consult  Post Acute Care Choice:    Choice offered to:     DME Arranged:    DME Agency:     HH Arranged:    [place] Agency:     Status of Service:  In process, will continue to follow  Medicare Important Message Given:    Date Medicare IM Given:    Medicare IM give by:    Date Additional Medicare IM Given:    Additional Medicare Important Message give by:     If discussed at Long Length of Stay Meetings, dates discussed:  CM spoke with pt regarding Heart failure teaching /reinforcement for [facility], [place] states would like  for nurse to come to home. Choice given to pt, ADV. Healthcare services selected. CM to f/u with pt regarding HHPT/OT after evaluations are done.  Additional Comments:  Sharin Mons, RN 04/19/2015, 4:34 PM

## 2015-04-19 NOTE — Progress Notes (Signed)
Subjective:  Sleeping, arousable. Hoping to go home tomorrow he says. Breathing improved. On NTG gtt.  Objective:  Vital Signs in the last 24 hours: Temp:  [97.9 F (36.6 C)-98.6 F (37 C)] 98.3 F (36.8 C) (05/10 1132) Pulse Rate:  [74-85] 82 (05/10 0700) Resp:  [14-27] 23 (05/10 0700) BP: (145-191)/(67-110) 170/88 mmHg (05/10 0700) SpO2:  [94 %-100 %] 100 % (05/10 0700) FiO2 (%):  [40 %] 40 % (05/09 1900) Weight:  [208 lb 1.8 oz (94.4 kg)] 208 lb 1.8 oz (94.4 kg) (05/10 0322)  Intake/Output from previous day: 05/09 0701 - 05/10 0700 In: 1331 [I.V.:1331] Out: 5450 [Urine:5450]   Physical Exam: General: Well developed, well nourished, in no acute distress. Head:  Normocephalic and atraumatic. Lungs: Clear to auscultation and percussion. Heart: Normal S1 and S2.  No murmur, rubs or gallops.  Abdomen: soft, non-tender, positive bowel sounds. Extremities: No clubbing or cyanosis. Neurologic: Alert     Lab Results:  Recent Labs  04/18/15 0532 04/18/15 0538 04/19/15 0244  WBC 8.2  --  7.0  HGB 11.2* 11.9* 9.4*  PLT 122*  --  87*    Recent Labs  04/18/15 0532 04/18/15 0538 04/19/15 0244  NA 136 140 140  K 3.9 4.0 3.2*  CL 108 109 110  CO2 16*  --  18*  GLUCOSE 101* 101* 93  BUN 33* 32* 38*  CREATININE 2.93* 3.10* 2.95*    Recent Labs  04/18/15 1622 04/18/15 2050  TROPONINI 0.05* 0.04*   Hepatic Function Panel  Recent Labs  04/18/15 0532  PROT 7.4  ALBUMIN 2.5*  AST 43*  ALT 18  ALKPHOS 125  BILITOT 0.9   No results for input(s): CHOL in the last 72 hours. No results for input(s): PROTIME in the last 72 hours.  Imaging: Dg Chest Port 1 View  04/18/2015   CLINICAL DATA:  Shortness of breath tonight.  EXAM: PORTABLE CHEST - 1 VIEW  COMPARISON:  08/15/2014  FINDINGS: Shallow inspiration. Heart size and pulmonary vascularity are normal for technique. No focal airspace disease or consolidation. No blunting of costophrenic angles. No  pneumothorax. Calcification of the aorta.  IMPRESSION: Shallow inspiration.  No evidence of active pulmonary disease.   Electronically Signed   By: Lucienne Capers M.D.   On: 04/18/2015 05:46   Personally viewed.   Telemetry: Occasional PVC Personally viewed.   EKG:  LVH, PVC, borderline QT prolongation  Cardiac Studies:  ECHO P  Scheduled Meds: . carvedilol  6.25 mg Oral BID WC  . folic acid  1 mg Oral Daily  . furosemide  80 mg Intravenous BID  . multivitamin with minerals  1 tablet Oral Daily  . potassium chloride  40 mEq Oral BID  . sodium bicarbonate  650 mg Oral BID  . sodium chloride  3 mL Intravenous Q12H  . thiamine  100 mg Oral Daily   Continuous Infusions: . nitroGLYCERIN 200 mcg/min (04/19/15 0856)   PRN Meds:.sodium chloride, acetaminophen, hydrALAZINE, ipratropium-albuterol, LORazepam **OR** LORazepam, ondansetron (ZOFRAN) IV, sodium chloride  Assessment/Plan:  Principal Problem:   Acute respiratory failure with hypoxia Active Problems:   CKD (chronic kidney disease) stage 3, GFR 30-59 ml/min   Tobacco abuse   Alcoholic cirrhosis   Esophageal varices without bleeding   Hepatitis C   S/P TIPS (transjugular intrahepatic portosystemic shunt)   Flash pulmonary edema   Hypertensive emergency   Acute on chronic diastolic heart failure   Respiratory failure/diastolic HF acute  - improved, off  BiPAP, likely pulm edema in setting of hypertensive emergency  - Will increase coreg to 12.5 BID  - Continue lasix IV. Out 5 liters. Agree with renal, likely PO tomorrow.   - Wean NTG as possible  - No ACI with renal dz.   - Consider adding amlodipine  Will follow.     SKAINS, [place] 04/19/2015, 11:52 AM

## 2015-04-19 NOTE — Progress Notes (Signed)
Took pt of Bipap, RA saturations 93-95 %, Respirations 15

## 2015-04-20 DIAGNOSIS — K7030 Alcoholic cirrhosis of liver without ascites: Secondary | ICD-10-CM

## 2015-04-20 DIAGNOSIS — J810 Acute pulmonary edema: Secondary | ICD-10-CM

## 2015-04-20 DIAGNOSIS — Z720 Tobacco use: Secondary | ICD-10-CM

## 2015-04-20 LAB — CBC
HCT: 28.2 % — ABNORMAL LOW (ref 39.0–52.0)
HEMOGLOBIN: 9.8 g/dL — AB (ref 13.0–17.0)
MCH: 30.8 pg (ref 26.0–34.0)
MCHC: 34.8 g/dL (ref 30.0–36.0)
MCV: 88.7 fL (ref 78.0–100.0)
PLATELETS: 90 10*3/uL — AB (ref 150–400)
RBC: 3.18 MIL/uL — AB (ref 4.22–5.81)
RDW: 13.1 % (ref 11.5–15.5)
WBC: 6.9 10*3/uL (ref 4.0–10.5)

## 2015-04-20 LAB — BASIC METABOLIC PANEL
ANION GAP: 11 (ref 5–15)
BUN: 40 mg/dL — AB (ref 6–20)
CO2: 21 mmol/L — ABNORMAL LOW (ref 22–32)
CREATININE: 3.19 mg/dL — AB (ref 0.61–1.24)
Calcium: 8.1 mg/dL — ABNORMAL LOW (ref 8.9–10.3)
Chloride: 106 mmol/L (ref 101–111)
GFR, EST AFRICAN AMERICAN: 24 mL/min — AB (ref >60)
GFR, EST NON AFRICAN AMERICAN: 21 mL/min — AB (ref >60)
Glucose, Bld: 122 mg/dL — ABNORMAL HIGH (ref 70–99)
Potassium: 3.4 mmol/L — ABNORMAL LOW (ref 3.5–5.1)
Sodium: 138 mmol/L (ref 135–145)

## 2015-04-20 MED ORDER — AMLODIPINE BESYLATE 5 MG PO TABS
5.0000 mg | ORAL_TABLET | Freq: Every day | ORAL | Status: DC
  Administered 2015-04-20 – 2015-04-21 (×2): 5 mg via ORAL
  Filled 2015-04-20 (×3): qty 1

## 2015-04-20 MED ORDER — FUROSEMIDE 40 MG PO TABS
40.0000 mg | ORAL_TABLET | Freq: Two times a day (BID) | ORAL | Status: DC
  Administered 2015-04-20 – 2015-04-21 (×3): 40 mg via ORAL
  Filled 2015-04-20 (×5): qty 1

## 2015-04-20 MED ORDER — POTASSIUM CHLORIDE CRYS ER 20 MEQ PO TBCR
40.0000 meq | EXTENDED_RELEASE_TABLET | Freq: Once | ORAL | Status: AC
  Administered 2015-04-20: 40 meq via ORAL
  Filled 2015-04-20: qty 2

## 2015-04-20 MED ORDER — CARVEDILOL 25 MG PO TABS
25.0000 mg | ORAL_TABLET | Freq: Two times a day (BID) | ORAL | Status: DC
  Administered 2015-04-20 – 2015-04-21 (×2): 25 mg via ORAL
  Filled 2015-04-20 (×4): qty 1

## 2015-04-20 MED ORDER — FUROSEMIDE 80 MG PO TABS
80.0000 mg | ORAL_TABLET | Freq: Two times a day (BID) | ORAL | Status: DC
  Filled 2015-04-20 (×3): qty 1

## 2015-04-20 NOTE — Progress Notes (Signed)
PT Cancellation Note  Patient Details Name: KAYE MITRO MRN: [id number] DOB: [birth year]   Cancelled Treatment:    Reason Eval/Treat Not Completed: Other (comment) (pt declined therapy intervention due to no needs per pt.)  04/20/2015  Donnella Sham, PT [phone]  (pager)   Crystian Frith, Tessie Fass 04/20/2015, 2:25 PM

## 2015-04-20 NOTE — Progress Notes (Signed)
OT Cancellation Note  Patient Details Name: Edwin Love MRN: [id number] DOB: 05-Feb-1961   Cancelled Treatment:    Reason Eval/Treat Not Completed: OT screened, no needs identified, will sign off . Pt declined therapy stating no needs. He reports he is ambulating without difficulty. Acute OT to sign off.    Villa Herb M   Cyndie Chime, OTR/L Occupational Therapist [phone] (pager)  04/20/2015, 3:25 PM

## 2015-04-20 NOTE — Progress Notes (Signed)
PROGRESS NOTE    Edwin Love ION:[id number] DOB: 1961/11/12 DOA: 04/18/2015 PCP: Jacqualine Mau  Primary Nephrologist: Dr. Jamal Maes  HPI/Brief narrative 54 year old male with history of alcoholic cirrhosis with portal hypertension/esophageal varices/GI bleed/TIPS procedure, chronic diastolic CHF, HTN, stage III chronic kidney disease, tobacco abuse, hepatitis C, chronic anemia, noncompliant with medications, admitted to [facility] stepdown unit on 04/18/15 with acute onset of dyspnea secondary to acute respiratory failure with hypoxia from acute on chronic diastolic CHF and hypertensive emergency. Cardiology and nephrology are consulting.  Assessment/Plan:  Acute respiratory failure with hypoxia - O2 saturations were 83% on room air on admission - Secondary to volume overload/acute on chronic diastolic CHF - Was on BiPAP overnight - Improved - Continue to wean off oxygen as tolerated.  Acute on chronic diastolic CHF - Precipitated by noncompliance to medications, acute on chronic kidney disease and hypertensive emergency. - Cardiology and nephrology input appreciated. - Minimally elevated troponin may be from demand ischemia related to decompensated CHF, hypertensive emergency and chronic kidney disease. Flat trend. 2D echo ordered. - Last echo 02/2014: LVEF 55-60 percent with grade 2 diastolic dysfunction. - Patient is currently on lasix 40 mg po bid  Hypertensive emergency - Secondary to noncompliance with medications - Started on nitroglycerin drip and titrated to maximum 200 g on 5/10 - Will discontinue NTG once patient is started on oral B blocker. - We'll have to adjust antihypertensives. - Hold ACEI/ARB  Hypokalemia - Replace and follow BMP  Chronic anemia - No reported bleeding - Hemoglobin has dropped from 11.2 > 9.4 in 24 hours - Follow CBC in a.m. and transfuse if hemoglobin less than 8 g per DL.  Stage III-IV chronic kidney disease - Followed by Dr.  Lorrene Reid as outpatient - Baseline creatinine near 3 but has been progressing fast in the last 6 months related to progressive GN of unclear etiology- - nephrology on board - Creatinine stable at 3.1  Alcoholic & hep C cirrhosis complicated by esophageal/rectal varices with history of GI bleed & TIPS procedure - Ongoing alcohol abuse , will encourage cessation.  Alcohol abuse - CIWA protocol  Thrombocytopenia - Secondary to alcohol abuse and cirrhosis - Follow CBCs   Code Status: Full Family Communication: None at bedside Disposition Plan: DC home when medically stable, possibly in 2-3 days. Continue treatment in stepdown unit for additional 24 hours due to ongoing NTG drip. Also at risk for decline.   Consultants:  Nephrology  Cardiology  Procedures:  BiPAP when necessary  Antibiotics:  None   Subjective: No new complaints. No acute issues reported overnight.  Objective: Filed Vitals:   04/20/15 1524 04/20/15 1652 04/20/15 1700 04/20/15 1800  BP: 162/98 159/94 154/87 141/84  Pulse:      Temp: 97.9 F (36.6 C)     TempSrc: Oral     Resp: 22 17 20 14   Height:      Weight:      SpO2:  98%      Intake/Output Summary (Last 24 hours) at 04/20/15 1855 Last data filed at 04/20/15 1800  Gross per 24 hour  Intake   2701 ml  Output   3600 ml  Net   -899 ml   Filed Weights   04/18/15 0919 04/19/15 0322 04/20/15 0300  Weight: 99.1 kg (218 lb 7.6 oz) 94.4 kg (208 lb 1.8 oz) 93.5 kg (206 lb 2.1 oz)     Exam:  General: Alert, awake, oriented x3, in no acute distress. Heart: Regular rate and  rhythm, no rubs Lungs: Clear to auscultation bilaterally, no wheezes Abdomen: Soft, nontender, no guarding Extremities: No clubbing cyanosis or edema with positive pedal pulses. Neuro: Answers questions appropriately moves extremities equally    Data Reviewed: Basic Metabolic Panel:  Recent Labs Lab 04/18/15 0532 04/18/15 0538 04/19/15 0244 04/20/15 0255  NA 136  140 140 138  K 3.9 4.0 3.2* 3.4*  CL 108 109 110 106  CO2 16*  --  18* 21*  GLUCOSE 101* 101* 93 122*  BUN 33* 32* 38* 40*  CREATININE 2.93* 3.10* 2.95* 3.19*  CALCIUM 8.6*  --  8.5* 8.1*   Liver Function Tests:  Recent Labs Lab 04/18/15 0532  AST 43*  ALT 18  ALKPHOS 125  BILITOT 0.9  PROT 7.4  ALBUMIN 2.5*    Recent Labs Lab 04/18/15 0532  LIPASE 78*   No results for input(s): AMMONIA in the last 168 hours. CBC:  Recent Labs Lab 04/18/15 0532 04/18/15 0538 04/19/15 0244 04/20/15 0255  WBC 8.2  --  7.0 6.9  NEUTROABS 6.1  --   --   --   HGB 11.2* 11.9* 9.4* 9.8*  HCT 32.0* 35.0* 27.0* 28.2*  MCV 90.4  --  88.8 88.7  PLT 122*  --  87* 90*   Cardiac Enzymes:  Recent Labs Lab 04/18/15 1001 04/18/15 1622 04/18/15 2050  TROPONINI 0.05* 0.05* 0.04*   BNP (last 3 results) No results for input(s): PROBNP in the last 8760 hours. CBG:  Recent Labs Lab 04/18/15 1604  GLUCAP 99    Recent Results (from the past 240 hour(s))  MRSA PCR Screening     Status: None   Collection Time: 04/18/15 11:08 AM  Result Value Ref Range Status   MRSA by PCR NEGATIVE NEGATIVE Final    Comment:        The GeneXpert MRSA Assay (FDA approved for NASAL specimens only), is one component of a comprehensive MRSA colonization surveillance program. It is not intended to diagnose MRSA infection nor to guide or monitor treatment for MRSA infections.           Studies: No results found.      Scheduled Meds: . amLODipine  5 mg Oral Daily  . carvedilol  25 mg Oral BID WC  . folic acid  1 mg Oral Daily  . furosemide  40 mg Oral BID  . multivitamin with minerals  1 tablet Oral Daily  . sodium bicarbonate  650 mg Oral BID  . sodium chloride  3 mL Intravenous Q12H  . thiamine  100 mg Oral Daily   Continuous Infusions:    Principal Problem:   Acute respiratory failure with hypoxia Active Problems:   CKD (chronic kidney disease) stage 3, GFR 30-59 ml/min    Tobacco abuse   Alcoholic cirrhosis   Esophageal varices without bleeding   Hepatitis C   S/P TIPS (transjugular intrahepatic portosystemic shunt)   Flash pulmonary edema   Hypertensive emergency   Acute on chronic diastolic heart failure   SOB (shortness of breath)    Time spent: 35 minutes    Demesha Boorman, Linward Foster, MD, FACP, [facility]. [facility] Pager [phone]  If 7PM-7AM, please contact night-coverage [email or web address] Password [email or web address] 04/20/2015, 6:55 PM    LOS: 2 days

## 2015-04-20 NOTE — Progress Notes (Signed)
Patient Name: Edwin Love Date of Encounter: 04/20/2015  Primary Cardiologist: New- Dr. Ron Parker   Principal Problem:   Acute respiratory failure with hypoxia Active Problems:   CKD (chronic kidney disease) stage 3, GFR 30-59 ml/min   Tobacco abuse   Alcoholic cirrhosis   Esophageal varices without bleeding   Hepatitis C   S/P TIPS (transjugular intrahepatic portosystemic shunt)   Flash pulmonary edema   Hypertensive emergency   Acute on chronic diastolic heart failure   SOB (shortness of breath)    SUBJECTIVE  Denies any CP or SOB.   CURRENT MEDS . amLODipine  5 mg Oral Daily  . carvedilol  25 mg Oral BID WC  . folic acid  1 mg Oral Daily  . furosemide  40 mg Oral BID  . multivitamin with minerals  1 tablet Oral Daily  . sodium bicarbonate  650 mg Oral BID  . sodium chloride  3 mL Intravenous Q12H  . thiamine  100 mg Oral Daily    OBJECTIVE  Filed Vitals:   04/20/15 0800 04/20/15 0900 04/20/15 1000 04/20/15 1100  BP: 175/97 146/77 138/87   Pulse: 76 76 73   Temp:    97.9 F (36.6 C)  TempSrc:    Oral  Resp: 21 15 17    Height:      Weight:      SpO2: 99% 100% 99%     Intake/Output Summary (Last 24 hours) at 04/20/15 1239 Last data filed at 04/20/15 1002  Gross per 24 hour  Intake   1620 ml  Output   3350 ml  Net  -1730 ml   Filed Weights   04/18/15 0919 04/19/15 0322 04/20/15 0300  Weight: 218 lb 7.6 oz (99.1 kg) 208 lb 1.8 oz (94.4 kg) 206 lb 2.1 oz (93.5 kg)    PHYSICAL EXAM  General: Pleasant, NAD. Neuro: Alert and oriented X 3. Moves all extremities spontaneously. Psych: Normal affect. HEENT:  Normal  Neck: Supple without bruits or JVD. Lungs:  Resp regular and unlabored, CTA. Heart: RRR no s3, s4, or murmurs. Abdomen: Soft, non-tender, non-distended, BS + x 4.  Extremities: No clubbing, cyanosis or edema. DP/PT/Radials 2+ and equal bilaterally.  Accessory Clinical Findings  CBC  Recent Labs  04/18/15 0532  04/19/15 0244  04/20/15 0255  WBC 8.2  --  7.0 6.9  NEUTROABS 6.1  --   --   --   HGB 11.2*  < > 9.4* 9.8*  HCT 32.0*  < > 27.0* 28.2*  MCV 90.4  --  88.8 88.7  PLT 122*  --  87* 90*  < > = values in this interval not displayed. Basic Metabolic Panel  Recent Labs  04/19/15 0244 04/20/15 0255  NA 140 138  K 3.2* 3.4*  CL 110 106  CO2 18* 21*  GLUCOSE 93 122*  BUN 38* 40*  CREATININE 2.95* 3.19*  CALCIUM 8.5* 8.1*   Liver Function Tests  Recent Labs  04/18/15 0532  AST 43*  ALT 18  ALKPHOS 125  BILITOT 0.9  PROT 7.4  ALBUMIN 2.5*    Recent Labs  04/18/15 0532  LIPASE 78*   Cardiac Enzymes  Recent Labs  04/18/15 1001 04/18/15 1622 04/18/15 2050  TROPONINI 0.05* 0.05* 0.04*   Thyroid Function Tests  Recent Labs  04/18/15 1001  TSH 3.373    TELE NSR with HR 70-80s    ECG  No new EKG  Echocardiogram  - Left ventricle: The cavity size was normal.  Wall thickness was severely increased up to 2.2 cm. Consider infiltrative disaese or hypertrophic cardiomyopathy as a cause. Systolic function was normal. The estimated ejection fraction was in the range of 60% to 65%. Wall motion was normal; there were no regional wall motion abnormalities. Doppler parameters are consistent with abnormal left ventricular relaxation (grade 1 diastolic dysfunction). The E/e&' ratio is >15, suggesting elevated LV filling pressure. - Aortic valve: Trileaflet. Elevated gradient without significant stenosis - mostly subvalvular.. There was no regurgitation. Mean gradient (S): 12 mm Hg. Peak gradient (S): 28 mm Hg. - Mitral valve: Mildly thickened leaflets . There was mild regurgitation. - Left atrium: Severely dilated at 51 ml/m2. - Right atrium: Moderately dilated at 26 ml/m2. - Atrial septum: Suggestion of atrial septal defect by color doppler. - Inferior vena cava: The vessel was normal in size. The respirophasic diameter changes were in the normal  range (>= 50%), consistent with normal central venous pressure.  Impressions:  - Compared to the prior study in 2015, the LV wall thickness has progressed to 2.2 cm. EF has increased to 60-65%.    Radiology/Studies  Dg Chest Port 1 View  04/18/2015   CLINICAL DATA:  Shortness of breath tonight.  EXAM: PORTABLE CHEST - 1 VIEW  COMPARISON:  08/15/2014  FINDINGS: Shallow inspiration. Heart size and pulmonary vascularity are normal for technique. No focal airspace disease or consolidation. No blunting of costophrenic angles. No pneumothorax. Calcification of the aorta.  IMPRESSION: Shallow inspiration.  No evidence of active pulmonary disease.   Electronically Signed   By: Lucienne Capers M.D.   On: 04/18/2015 05:46    ASSESSMENT AND PLAN  54 yo male with uncontrolled HTN present with dyspnea and CHF. H/o alcoholic cirrhosis and hepatitis. H/o Med noncompliance.   1. Acute respiratory failure with hypoxia  - in the setting of hypertensive emergency and acute HF  2. Acute on chronic diastolic HF  - Echo 1/44/8185 EF 60-65%, severe wall thickness up to 2.2, grade 1 diastolic dysfunction, mild MR, severely dilated LA, moderately dilated RA.   - per echo report, ?hypertrophic cardiomyopathy vs infiltrative dx with thickened LV wall, which likely related to chronic HTN  3. Hypertensive emergency  - coreg increased to 25mg  BID this afternoon, amlodipine added. Hopefully can d/c nitro gtt and discharge tomorrow  4. Acute on Stage 3 CKD 5. Hepatitis C 6. Alcoholic cirrhosis s/p TIPS 7. Tobacco abuse 8. Esophageal varices without bleeding  Signed, Almyra Deforest PA-C Pager: [phone]  Personally seen and examined. Agree with above. Severe LVH (likely uncontrolled HTN) Agree with med changes as above Sitting up in bed in no distress. Parents at bedside.  Candee Furbish, MD

## 2015-04-20 NOTE — Progress Notes (Signed)
[facility] - Nephrology Progress Note  Subjective:  No acute events overnight. Remains on Nitro gtt this AM. Today patient resting in bed, continues to feel better. Breathing well without problems on RA overnight (>95%). Tolerating PO. No new complaints at this time. - Another brisk UOP in response to IV diuresis yesterday, -3.95L - Denies any chest pain or tightness, SOB, abdominal pain, nausea / vomiting  Objective:  Vital signs in last 24 hours:  Temp:  [98.3 F (36.8 C)-99.1 F (37.3 C)] 98.4 F (36.9 C) (05/11 0700) Pulse Rate:  [72-81] 73 (05/11 1000) Resp:  [15-26] 17 (05/11 1000) BP: (138-176)/(67-97) 138/87 mmHg (05/11 1000) SpO2:  [96 %-100 %] 99 % (05/11 1000) Weight:  [206 lb 2.1 oz (93.5 kg)] 206 lb 2.1 oz (93.5 kg) (05/11 0300) Weight change: -12 lb 5.5 oz (-5.6 kg)  Intake/Output: I/O last 3 completed shifts: In: 2831 [P.O.:720; I.V.:2111] Out: 6300 [Urine:6300]  Intake/Output this shift:  Total I/O In: 480 [P.O.:360; I.V.:120] Out: 500 [Urine:500]  EXAM: General: resting comfortably in bed, cooperative, NAD Neck: supple, non-tender, no JVD Heart: RRR, no murmurs heard Lungs: Mostly resolved bibasilar crackles with overall clear lungs. Good air movement. Non-labored.  Abdomen: obese, soft, NTND, no focal masses, +active BS Extremities: Resolved LE edema, maybe trace non-pitting now, intact peripheral pulses Skin: warm, dry Neuro: awake, alert, oriented  Lab Results:  Recent Labs  04/18/15 0532 04/18/15 0538 04/19/15 0244 04/20/15 0255  WBC 8.2  --  7.0 6.9  HGB 11.2* 11.9* 9.4* 9.8*  HCT 32.0* 35.0* 27.0* 28.2*  PLT 122*  --  87* 90*   BMET  Recent Labs  04/18/15 0532 04/18/15 0538 04/19/15 0244 04/20/15 0255  NA 136 140 140 138  K 3.9 4.0 3.2* 3.4*  CL 108 109 110 106  CO2 16*  --  18* 21*  GLUCOSE 101* 101* 93 122*  BUN 33* 32* 38* 40*  CREATININE 2.93* 3.10* 2.95* 3.19*  CALCIUM 8.6*  --  8.5* 8.1*    LFT  Recent Labs  04/18/15 0532  PROT 7.4  ALBUMIN 2.5*  AST 43*  ALT 18  ALKPHOS 125  BILITOT 0.9   PT/INR  Recent Labs  04/18/15 0532  LABPROT 15.6*  INR 1.23   Hepatitis Panel No results for input(s): HEPBSAG, HCVAB, HEPAIGM, HEPBIGM in the last 72 hours. PTH: Lab Results  Component Value Date   PTH 86* 04/18/2015   CALCIUM 8.1* 04/20/2015   CAION 1.15 04/18/2015   PHOS 3.2 06/29/2014    Studies/Results: No results found.  Background: 37 yr M admitted on 5/9 with acute decomp dCHF with 2/2 acute pulm edema leading to acute resp failure (req BiPAP). Additionally with HTN Emergency (BP >220/110).  Cardiology consulted. Renal was consulted at time of admit for AoCKD-III to IV (established with [facility]), suspected in setting of HTN emergency and acute decompensated dCHF with reduced perfusion, additionally admit SCr 3.10 (only mildly above new b/l about 3.0 from [place] records on 03/29/15), suspected CKD continues to advance with uncontrolled HTN and underlying progressive GN with nephrotic proteinuria. Prior work-up with negative SPEP / UPEP, UPC 6.7g, neg ANA / ANCA, nml Complete, neg cryoglobulins). Repeat PTH 86.  Significant PMH alcoholic cirrhosis with Hep C, dCHF (Grade 2), CKD-III, HTN (poorly controlled, ?med adherence), and active tobacco and alcohol abuse.  Assessment/Plan: 1. AoCKD-III to IV with progressing GN with chronic nephrotic proteinuria, chronic NAG-Met Acidosis, with fluid overload (in setting of #3, #5), presumed d/t ATN -  Improved. - Followed by Dr. Lorrene Reid ([facility]). Last Renal US (02/2015 - R-12.2cm, L-12.9cm, inc b/l echogenicity, no obstruction or mass. - Baseline SCr near 3.0 (last checked 4/19 at [facility]) - Increased bump in SCr 2.95 to 3.19 (5/11). BUN 40. Given significant clinical improvement and clinical BP control, suspect response to aggressive diuresis. Unclear new b/l in setting of underlying progressive GN. - Greatly improved fluid status.  Clinically euvolemic. Brisk diuresis response in Lasix naive patient with -5.45L > 3.95L UOP yesterday (net -6.56L out), wt down -2lb to 206 near EDW 200-205 lbs (-12 lb from admit 218) - Transition to Lasix PO from IV. Start at Lasix PO 69m BID today, likely continue on discharge. - Continue home sodium bicarb 6544mPO BID - for chronic met acidosis (Bicarb 18 to 21) - Regarding progressive GN (unclear etiology, ?Hep C), not good candidate for renal biopsy - Discussed with patient that given significant improvement, no indication for HD now. Defer future discussion for potential HD needs to outpatient with Dr. DuLorrene Reid 2. HTN Emergency / Uncontrolled - Improved. - On admit BP 224/116. Past 24 hr SBP 140-176. - Cardiology following. Goal to wean Nitro gtt and transition to POs- hopefully can get off ntg today - will take coreg up to 25 BID and add amlodipine -Hold ACEi/ARB until SCr stabilized  3. Acute on chronic Diastolic CHF (Improved) / Acute Pulmonary Edema (Resolved) - Cardiology following. Last ECHO 02/2014 (LVEF 5574-12%Grd 2 Diastolic dysfxn). ECHO 04/19/15 - Improved LVEF 6087-86%Grd 1 diastolic dysfxn  4. Acute Hypoxic Respiratory Failure (initially req BiPAP), d/t #3 - Resolved  5. Alcoholic Cirrhosis, with h/o Hep C, complicated eso/rectal varices with h/o hemorrhage - s/p rectal varceal surgery and TIPs procedure, active EtOH use. No active bleeding.  6. Chronic Anemia, in CKD with prior h/o acute variceal bleed - Hgb 11.9, stable, ?new baseline. - 5/9 Iron studies: iron 97, TIBC 305, T-sat 32, ferritin 55, folate / B12 - all normal range No indication for Aranesp or IV Fe at this time.  7. Hypokalemia - 3.4 - ordered PO K 4078mx 1 today (with lowered diuretic dose)  8. Dispo - Anticipate next step to transfer out of SDU and likely home 1-2 days, depending on able to tolerate wean off Nitro gtt and BP stability.  Renal will sign off today. Recommend continuing Lasix PO 55m96mD  on discharge, additionally can improve BP, suspect pt's volume overload was acute onset likely triggered by HTN and unlikely to vigorously return if BP controlled. Can titrate Lasix outpatient as needed.  Patient has scheduled follow-up with Dr DunhLorrene ReidA)[place] 5/31 @ 3:45pm, additionally already has appointments on 5/26 for [facility] (Vein Mapping, Duplex US) [place] with [facility]-Dr. FielOneida Alar[place]Nobie [place] (Currently working with [facility] - Renal Service) 11:24 AM   Patient seen and examined, agree with above note with above modifications. 54 y43r old with CKD at baseline crt 3- f/b Dr. DunhLorrene Reiddmitted with hypertensive urgency/pulm edema- resolved with diuresis with basically stable renal function.  Only issue remaining is HTN- have increased coreg again and added amlodipine and transitioned his lasix to a reasonable PO dose.  Hopefully can get NTG off today then would be stable for discharge possibly tomorrow- all follow up has been arranged.  I will look at his labs tomorrow to make sure stable Kell[place]liss Parish 04/20/2015

## 2015-04-21 LAB — BASIC METABOLIC PANEL
ANION GAP: 10 (ref 5–15)
BUN: 40 mg/dL — ABNORMAL HIGH (ref 6–20)
CHLORIDE: 107 mmol/L (ref 101–111)
CO2: 22 mmol/L (ref 22–32)
Calcium: 8.2 mg/dL — ABNORMAL LOW (ref 8.9–10.3)
Creatinine, Ser: 3.22 mg/dL — ABNORMAL HIGH (ref 0.61–1.24)
GFR calc Af Amer: 24 mL/min — ABNORMAL LOW (ref >60)
GFR calc non Af Amer: 20 mL/min — ABNORMAL LOW (ref >60)
Glucose, Bld: 94 mg/dL (ref 65–99)
Potassium: 3.3 mmol/L — ABNORMAL LOW (ref 3.5–5.1)
SODIUM: 139 mmol/L (ref 135–145)

## 2015-04-21 MED ORDER — AMLODIPINE BESYLATE 10 MG PO TABS: 10.0000 mg | ORAL_TABLET | Freq: Every day | ORAL | Status: DC

## 2015-04-21 MED ORDER — FUROSEMIDE 40 MG PO TABS: 40.0000 mg | ORAL_TABLET | Freq: Two times a day (BID) | ORAL | Status: AC

## 2015-04-21 MED ORDER — POTASSIUM CHLORIDE ER 10 MEQ PO TBCR: 10.0000 meq | EXTENDED_RELEASE_TABLET | Freq: Every day | ORAL | Status: AC

## 2015-04-21 MED ORDER — CARVEDILOL 25 MG PO TABS: 25.0000 mg | ORAL_TABLET | Freq: Two times a day (BID) | ORAL | Status: AC

## 2015-04-21 MED ORDER — POTASSIUM CHLORIDE CRYS ER 20 MEQ PO TBCR
40.0000 meq | EXTENDED_RELEASE_TABLET | Freq: Once | ORAL | Status: DC
  Filled 2015-04-21: qty 2

## 2015-04-21 NOTE — Discharge Summary (Signed)
Physician Discharge Summary  Edwin Love DPO:[id number] DOB: 1961/10/18 DOA: 04/18/2015  PCP: Montey Hora, PA-C  Admit date: 04/18/2015 Discharge date: 04/21/2015  Time spent: > 35 minutes  Recommendations for Outpatient Follow-up:  1. Please encourage compliance with medications 2. Continue to encourage alcohol cessation  Discharge Diagnoses:  Principal Problem:   Acute respiratory failure with hypoxia Active Problems:   CKD (chronic kidney disease) stage 3, GFR 30-59 ml/min   Tobacco abuse   Alcoholic cirrhosis   Esophageal varices without bleeding   Hepatitis C   S/P TIPS (transjugular intrahepatic portosystemic shunt)   Flash pulmonary edema   Hypertensive emergency   Acute on chronic diastolic heart failure   SOB (shortness of breath)   Discharge Condition: stable  Diet recommendation: renal diet/ low sodium diet  Filed Weights   04/19/15 0322 04/20/15 0300 04/21/15 0342  Weight: 94.4 kg (208 lb 1.8 oz) 93.5 kg (206 lb 2.1 oz) 92.1 kg (203 lb 0.7 oz)    History of present illness:  From original HPI: 54 y.o. male with a past medical history of alcoholic cirrhosis, diastolic heart failure, chronic kidney disease, hypertension, noncompliance who woke up this morning to use the bathroom and developed shortness of breath. Apparently was noted to have wheezing initially and then subsequent he was noted to have crackles. He was placed on BiPAP. The patient denies any chest pain. History is very limited as the patient is a poor historian, along the fact that he is on a BiPAP currently.  Hospital Course:  Acute respiratory failure with hypoxia - O2 saturations were 83% on room air on admission - Secondary to volume overload/acute on chronic diastolic CHF - Was on BiPAP overnight - Improved - Continue to wean off oxygen as tolerated.  Acute on chronic diastolic CHF - Precipitated by noncompliance to medications, acute on chronic kidney disease and hypertensive  emergency. - Cardiology and nephrology input appreciated. - Minimally elevated troponin may be from demand ischemia related to decompensated CHF, hypertensive emergency and chronic kidney disease. Flat trend. 2D echo ordered. - Last echo 02/2014: LVEF 55-60 percent with grade 2 diastolic dysfunction. - Patient is currently on lasix 40 mg po bid  Hypertensive emergency - Secondary to noncompliance with medications, patient threatening to leave AMA as he reports feeling good. Placed orders for discharge so help patient obtain blood pressure medications on discharge.  His last blood pressure was 149/80 prior to discharge. Recommend patient f/u with pcp for further monitoring and adjustment of blood pressure medication. - d/c on B blocker and Amlodipine  Hypokalemia - Replace prior to discharge.  Chronic anemia - No reported bleeding - Hemoglobin steady at 9.8.    Stage III-IV chronic kidney disease - Followed by Dr. Lorrene Reid as outpatient - Baseline creatinine near 3 but has been progressing fast in the last 6 months related to progressive GN of unclear etiology- - nephrology on board - Creatinine stable at 3.1 - pt to f/u on discharge with nephrology  Alcoholic & hep C cirrhosis complicated by esophageal/rectal varices with history of GI bleed & TIPS procedure - encourage cessation  Alcohol abuse - CIWA protocol  Thrombocytopenia - Secondary to alcohol abuse and cirrhosis - Follow CBCs  Procedures:  None  Consultations:  Nephro  cardiology  Discharge Exam: Filed Vitals:   04/21/15 0827  BP: 149/80  Pulse:   Temp:   Resp:     General: Pt in nad, alert and awake Cardiovascular: rrr, no rubs Respiratory: cta bl, no wheezes  Discharge Instructions   Discharge Instructions    Call MD for:  difficulty breathing, headache or visual disturbances    Complete by:  As directed      Call MD for:  extreme fatigue    Complete by:  As directed      Call MD for:  severe  uncontrolled pain    Complete by:  As directed      Call MD for:  temperature >100.4    Complete by:  As directed      Diet - low sodium heart healthy    Complete by:  As directed      Discharge instructions    Complete by:  As directed   Please follow up with your primary care physician in 1 week to reassess your blood pressures.  Recommend having your BMP rechecked in 1 week.     Increase activity slowly    Complete by:  As directed           Current Discharge Medication List    START taking these medications   Details  amLODipine (NORVASC) 10 MG tablet Take 1 tablet (10 mg total) by mouth daily. Qty: 30 tablet, Refills: 0    furosemide (LASIX) 40 MG tablet Take 1 tablet (40 mg total) by mouth 2 (two) times daily. Qty: 30 tablet, Refills: 0    potassium chloride (K-DUR) 10 MEQ tablet Take 1 tablet (10 mEq total) by mouth daily. Qty: 15 tablet, Refills: 0      CONTINUE these medications which have CHANGED   Details  carvedilol (COREG) 25 MG tablet Take 1 tablet (25 mg total) by mouth 2 (two) times daily with a meal. Qty: 60 tablet, Refills: 0       No Known Allergies Follow-up Information    Follow up with DUNHAM,CYNTHIA B, MD On 05/10/2015.   Specialty:  Nephrology   Why:  Hospital follow-up. Appointment at 3:45pm   Contact information:   [place] [address] [phone]       Follow up with [facility] On 05/05/2015.   Specialty:  Vascular Surgery   Why:  Ultrasound Vein Mapping - scheduled to start at 11:30am. Please arrive early.   Contact information:   [address] [id number] [place] [phone]      Follow up with Ruta Hinds, MD On 05/05/2015.   Specialties:  Vascular Surgery, Cardiology   Why:  At 12:30pm   Contact information:   [address] [place] [address] [phone]       Follow up with Richardson Dopp, PA-C On 05/12/2015.   Specialties:   Physician Assistant, Radiology, Interventional Cardiology   Why:  2:20pm.   Contact information:   [address] [place] [address] [phone]        The results of significant diagnostics from this hospitalization (including imaging, microbiology, ancillary and laboratory) are listed below for reference.    Significant Diagnostic Studies: Dg Chest Port 1 View  04/18/2015   CLINICAL DATA:  Shortness of breath tonight.  EXAM: PORTABLE CHEST - 1 VIEW  COMPARISON:  08/15/2014  FINDINGS: Shallow inspiration. Heart size and pulmonary vascularity are normal for technique. No focal airspace disease or consolidation. No blunting of costophrenic angles. No pneumothorax. Calcification of the aorta.  IMPRESSION: Shallow inspiration.  No evidence of active pulmonary disease.   Electronically Signed   By: Lucienne Capers M.D.   On: 04/18/2015 05:46  Microbiology: Recent Results (from the past 240 hour(s))  MRSA PCR Screening     Status: None   Collection Time: 04/18/15 11:08 AM  Result Value Ref Range Status   MRSA by PCR NEGATIVE NEGATIVE Final    Comment:        The GeneXpert MRSA Assay (FDA approved for NASAL specimens only), is one component of a comprehensive MRSA colonization surveillance program. It is not intended to diagnose MRSA infection nor to guide or monitor treatment for MRSA infections.      Labs: Basic Metabolic Panel:  Recent Labs Lab 04/18/15 0532 04/18/15 0538 04/19/15 0244 04/20/15 0255 04/21/15 0253  NA 136 140 140 138 139  K 3.9 4.0 3.2* 3.4* 3.3*  CL 108 109 110 106 107  CO2 16*  --  18* 21* 22  GLUCOSE 101* 101* 93 122* 94  BUN 33* 32* 38* 40* 40*  CREATININE 2.93* 3.10* 2.95* 3.19* 3.22*  CALCIUM 8.6*  --  8.5* 8.1* 8.2*   Liver Function Tests:  Recent Labs Lab 04/18/15 0532  AST 43*  ALT 18  ALKPHOS 125  BILITOT 0.9  PROT 7.4  ALBUMIN 2.5*    Recent Labs Lab 04/18/15 0532  LIPASE 78*   No results for  input(s): AMMONIA in the last 168 hours. CBC:  Recent Labs Lab 04/18/15 0532 04/18/15 0538 04/19/15 0244 04/20/15 0255  WBC 8.2  --  7.0 6.9  NEUTROABS 6.1  --   --   --   HGB 11.2* 11.9* 9.4* 9.8*  HCT 32.0* 35.0* 27.0* 28.2*  MCV 90.4  --  88.8 88.7  PLT 122*  --  87* 90*   Cardiac Enzymes:  Recent Labs Lab 04/18/15 1001 04/18/15 1622 04/18/15 2050  TROPONINI 0.05* 0.05* 0.04*   BNP: BNP (last 3 results)  Recent Labs  04/18/15 0532  BNP 3344.2*    ProBNP (last 3 results) No results for input(s): PROBNP in the last 8760 hours.  CBG:  Recent Labs Lab 04/18/15 1604  GLUCAP 99       Signed:  Velvet Bathe  [facility] 04/21/2015, 10:18 AM

## 2015-04-21 NOTE — Progress Notes (Signed)
BP 180/83 after 10mg  of IV hydralazine administered. NP paged. Awaiting response. Will continue to monitor.   Lum Babe, RN

## 2015-04-21 NOTE — Progress Notes (Signed)
Provided patient with discharge instructions. Spoke at length about medications and follow up orders. Pt verbalized understanding of instructions. All belongs packed to be sent home. Removed PIV. Will wheelchair out once ride has arrived. VSS at time of discharge.

## 2015-04-26 ENCOUNTER — Inpatient Hospital Stay: Payer: Medicaid Other | Admitting: Pulmonary Disease

## 2015-05-04 ENCOUNTER — Encounter: Payer: Self-pay | Admitting: Vascular Surgery

## 2015-05-05 ENCOUNTER — Other Ambulatory Visit: Payer: Self-pay

## 2015-05-05 ENCOUNTER — Ambulatory Visit (INDEPENDENT_AMBULATORY_CARE_PROVIDER_SITE_OTHER): Payer: Medicaid Other | Admitting: Vascular Surgery

## 2015-05-05 ENCOUNTER — Ambulatory Visit (HOSPITAL_COMMUNITY)
Admission: RE | Admit: 2015-05-05 | Discharge: 2015-05-05 | Disposition: A | Discharge status: Home / Self Care | Payer: Medicaid Other | Source: Ambulatory Visit | Attending: Vascular Surgery | Admitting: Vascular Surgery

## 2015-05-05 ENCOUNTER — Encounter: Payer: Self-pay | Admitting: Vascular Surgery

## 2015-05-05 ENCOUNTER — Ambulatory Visit (INDEPENDENT_AMBULATORY_CARE_PROVIDER_SITE_OTHER)
Admission: RE | Admit: 2015-05-05 | Discharge: 2015-05-05 | Disposition: A | Discharge status: Home / Self Care | Payer: Medicaid Other | Source: Ambulatory Visit | Attending: Vascular Surgery | Admitting: Vascular Surgery

## 2015-05-05 VITALS — BP 125/73 | HR 66 | Temp 98.2°F | Resp 16 | Ht 69.0 in | Wt 210.0 lb

## 2015-05-05 DIAGNOSIS — N184 Chronic kidney disease, stage 4 (severe): Secondary | ICD-10-CM

## 2015-05-05 DIAGNOSIS — Z01810 Encounter for preprocedural cardiovascular examination: Secondary | ICD-10-CM | POA: Diagnosis not present

## 2015-05-05 NOTE — Progress Notes (Signed)
[facility] HISTORY AND PHYSICAL   History of Present Illness:  Patient is a 54 y.o. year old male who presents for placement of a permanent hemodialysis access. The patient is right handed.  The patient is not currently on hemodialysis.  The cause of renal failure is thought to be secondary to possibly hepatitis C.  patient has had a prior TIPS procedure. He also has a history of coagulopathy esophageal varices and thrombocytopenia. Other chronic medical problems include hypertension, CK D4 at this point, cirrhosis.  Past Medical History  Diagnosis Date  . Hypertension   . Back pain   . Renal disorder     CKD stage 3 noted in 02/2014 [facility] & [facility]  . Anemia 01/2014  . LVH (left ventricular hypertrophy) 02/2014    grade 2 diastolic dysfunction.   . Ankle fracture, left 10/2008    non surgical mgt.   . Thrombocytopenia 06/2014  . Coagulopathy 06/2014  . Esophageal varices   . GI bleed   . Cirrhosis     Past Surgical History  Procedure Laterality Date  . Eye surgery Left 1990's    Foreign body  . Inguinal hernia repair Right 02/08/2014    Procedure: HERNIA REPAIR INGUINAL INCARCERATED;  Surgeon: Edward Jolly, MD;  Location: [facility];  Service: General;  Laterality: Right;  . Insertion of mesh Right 02/08/2014    Procedure: INSERTION OF MESH;  Surgeon: Edward Jolly, MD;  Location: [facility];  Service: General;  Laterality: Right;  . Hernia repair    . Esophagogastroduodenoscopy N/A 06/24/2014    Procedure: ESOPHAGOGASTRODUODENOSCOPY (EGD);  Surgeon: Inda Castle, MD;  Location: [facility];  Service: Endoscopy;  Laterality: N/A;  . Colonoscopy N/A 06/25/2014    Procedure: COLONOSCOPY;  Surgeon: Inda Castle, MD;  Location: [facility];  Service: Endoscopy;  Laterality: N/A;  . Hemorrhoid surgery N/A 06/25/2014    Procedure: oversew of rectal varices;  Surgeon: Harl Bowie, MD;  Location: [facility];  Service: General;  Laterality: N/A;  . Radiology with  anesthesia N/A 07/02/2014    Procedure: RADIOLOGY WITH ANESTHESIA;  Surgeon: Jacqulynn Cadet, MD;  Location: [facility];  Service: Radiology;  Laterality: N/A;     Social History History  Substance Use Topics  . Smoking status: Current Every Day Smoker -- 0.30 packs/day for 34 years    Types: Cigarettes  . Smokeless tobacco: Never Used     Comment: Smoke since 54 years old  . Alcohol Use: 0.0 oz/week    0 Standard drinks or equivalent per week     Comment: 40 oz every 2-3 days    Family History Family History  Problem Relation Age of Onset  . Diabetes Brother     Mother too  . Hypertension      family  . Sickle cell anemia Sister     Allergies  No Known Allergies   Current Outpatient Prescriptions  Medication Sig Dispense Refill  . amLODipine (NORVASC) 10 MG tablet Take 1 tablet (10 mg total) by mouth daily. 30 tablet 0  . carvedilol (COREG) 25 MG tablet Take 1 tablet (25 mg total) by mouth 2 (two) times daily with a meal. 60 tablet 0  . furosemide (LASIX) 40 MG tablet Take 1 tablet (40 mg total) by mouth 2 (two) times daily. 30 tablet 0  . potassium chloride (K-DUR) 10 MEQ tablet Take 1 tablet (10 mEq total) by mouth daily. 15 tablet 0   No current facility-administered  medications for this visit.    ROS:   General:  No weight loss, Fever, chills  HEENT: No recent headaches, no nasal bleeding, no visual changes, no sore throat  Neurologic: No dizziness, blackouts, seizures. No recent symptoms of stroke or mini- stroke. No recent episodes of slurred speech, or temporary blindness.  Cardiac: No recent episodes of chest pain/pressure, no shortness of breath at rest.  No shortness of breath with exertion.  Denies history of atrial fibrillation or irregular heartbeat  Vascular: No history of rest pain in feet.  No history of claudication.  No history of non-healing ulcer, No history of DVT   Pulmonary: No home oxygen, no productive cough, no hemoptysis,  No asthma or  wheezing  Musculoskeletal:  [ ]  Arthritis, [ ]  Low back pain,  [ ]  Joint pain  Hematologic:No history of hypercoagulable state.  No history of easy bleeding.  No history of anemia  Gastrointestinal: No hematochezia or melena,  No gastroesophageal reflux, no trouble swallowing  Urinary: [x ] chronic Kidney disease, [ ]  on HD - [ ]  MWF or [ ]  TTHS, [ ]  Burning with urination, [ ]  Frequent urination, [ ]  Difficulty urinating;   Skin: No rashes  Psychological: No history of anxiety,  No history of depression   Physical Examination  Filed Vitals:   05/05/15 1217  BP: 125/73  Pulse: 66  Temp: 98.2 F (36.8 C)  TempSrc: Oral  Resp: 16  Height: 5\' 9"  (1.753 m)  Weight: 210 lb (95.255 kg)  SpO2: 99%    Body mass index is 31 kg/(m^2).  General:  Alert and oriented, no acute distress HEENT: Normal Neck: No bruit or JVD Pulmonary: Clear to auscultation bilaterally Cardiac: Regular Rate and Rhythm without murmur Gastrointestinal: Soft, non-tender, non-distended, no mass, no scars, distended with ascites Skin: No rash Extremity Pulses:  2+ radial, brachial pulses bilaterally Musculoskeletal: No deformity or edema  Neurologic: Upper and lower extremity motor 5/5 and symmetric  DATA: Patient had a vein mapping ultrasound today which showed the cephalic vein in the right forearm was 3 mm the right radial artery was 3 mm the left cephalic vein was small in the forearm is greater than 4 mm in the upper arm brachial artery was 5 mm in the left arm. I reviewed and interpreted this study.   ASSESSMENT: Patient needs long-term hemodialysis access. His anatomy is suitable for a left brachiocephalic AV fistula especially since he is right-hand dominant.   PLAN:  Left arm AV fistula on 05/10/2015. Risks benefits possible competitions procedure details were expended the patient today including but not limited to bleeding infection ischemic steal non-maturation of the fistula. We will need to  obtain a new CBC to check his platelet count as well as a PT and PTT to check his coagulation factors to make sure that he has reasonable clotting factors to undergo an operation. These will be scheduled for the day of his operation. If his clotting factors are sufficiently out of range we may need to consider rescheduling him when platelets and fresh frozen plasma would be available.  Ruta Hinds, MD [facility] Office: [phone] Pager: [phone]

## 2015-05-06 ENCOUNTER — Encounter (HOSPITAL_COMMUNITY): Payer: Self-pay | Admitting: *Deleted

## 2015-05-06 NOTE — Anesthesia Preprocedure Evaluation (Addendum)
Anesthesia Evaluation  Patient identified by MRN, date of birth, ID band Patient awake    Reviewed: Allergy & Precautions, NPO status , Patient's Chart, lab work & pertinent test results, reviewed documented beta blocker date and time   Airway Mallampati: II  TM Distance: >3 FB Neck ROM: Full    Dental   Pulmonary Current Smoker,  breath sounds clear to auscultation        Cardiovascular hypertension, Pt. on medications and Pt. on home beta blockers +CHF Rhythm:Regular Rate:Normal     Neuro/Psych negative neurological ROS     GI/Hepatic negative GI ROS, (+) Cirrhosis -  Esophageal Varices  substance abuse  alcohol use, Hepatitis -, CS/p TIPS    Endo/Other  Morbid obesity  Renal/GU CRFRenal disease     Musculoskeletal   Abdominal   Peds  Hematology  (+) anemia ,   Anesthesia Other Findings   Reproductive/Obstetrics                          Lab Results  Component Value Date   WBC 7.8 05/10/2015   HGB 10.3* 05/10/2015   HCT 28.5* 05/10/2015   MCV 85.8 05/10/2015   PLT 130* 05/10/2015   Lab Results  Component Value Date   CREATININE 3.78* 05/10/2015   BUN 75* 05/10/2015   NA 137 05/10/2015   K 4.1 05/10/2015   CL 110 05/10/2015   CO2 15* 05/10/2015     Anesthesia Physical Anesthesia Plan  ASA: III  Anesthesia Plan: General   Post-op Pain Management:    Induction: Intravenous  Airway Management Planned: LMA  Additional Equipment:   Intra-op Plan:   Post-operative Plan:   Informed Consent: I have reviewed the patients History and Physical, chart, labs and discussed the procedure including the risks, benefits and alternatives for the proposed anesthesia with the patient or authorized representative who has indicated his/her understanding and acceptance.   Dental advisory given  Plan Discussed with: CRNA  Anesthesia Plan Comments:      Anesthesia Quick  Evaluation

## 2015-05-06 NOTE — Progress Notes (Signed)
Anesthesia Chart Review: SAME DAY WORK-UP.  Patient is a 54 year old male scheduled for LUE (brachiocephalic) AVF creation on 05/10/15 by Dr. Oneida Alar. Procedure is posted for MAC. He has known hepatitis C and alcoholic cirrhosis.  Patient was just seen by Dr. Oneida Alar yesterday.  According to his note, "Left arm AV fistula on 05/10/2015. Risks benefits possible competitions procedure details were expended the patient today including but not limited to bleeding infection ischemic steal non-maturation of the fistula. We will need to obtain a new CBC to check his platelet count as well as a PT and PTT to check his coagulation factors to make sure that he has reasonable clotting factors to undergo an operation. These will be scheduled for the day of his operation. If his clotting factors are sufficiently out of range we may need to consider rescheduling him when platelets and fresh frozen plasma would be available."  History includes alcoholic cirrhosis, Hepatitis C, esophageal varices with hospitalization for bleeding 06/2014 s/p oversewing of rectal varices 06/24/14, s/p TIPS with stenting of main portal vein and coil embolization of porto-systemic shunt 5/40/08, chronic diastolic CHF, probable PFO by 02/16/14 echo, HTN, smoking, CKD stage IV (not yet on HD), s/p right IHR '15. He was hospitalized from 04/18/15-04/21/15 for acute respiratory failure (treated with Bi-PAP and diuretics) in the setting of hypertensive emergency (required Nitro gtt while transitioned to oral agents), acute on chronic diastolic CHF, and underlying CKD. Notes in Epic indicate a history of medical non-compliance and of being a poor historian.   PCP is listed as Montey Hora, PA-C. Nephrologist is Dr. Lorrene Reid. He is recently re-established with [facility] (Dr. Ron Parker, but saw Dr. Marlou Porch in 2015) during his 04/2015 admission. Saw GI Dr. [name] in 2015.  Meds include amlodipine, Coreg, Lasix, KCl.  04/19/15 EKG: SR with occasional PVC, possible  LAE, LVH, prolonged QT.  04/19/15 Echo: - Left ventricle: The cavity size was normal. Wall thickness was severely increased up to 2.2 cm. Consider infiltrative disaese or hypertrophic cardiomyopathy as a cause. Systolic function was normal. The estimated ejection fraction was in the range of 60% to 65%. Wall motion was normal; there were no regional wall motion abnormalities. Doppler parameters are consistent with abnormal left ventricular relaxation (grade 1 diastolic dysfunction). The E/e&' ratio is >15, suggesting elevated LV filling pressure. - Aortic valve: Trileaflet. Elevated gradient without significant stenosis - mostly subvalvular.. There was no regurgitation. Mean gradient (S): 12 mm Hg. Peak gradient (S): 28 mm Hg. - Mitral valve: Mildly thickened leaflets . There was mild regurgitation. - Left atrium: Severely dilated at 51 ml/m2. - Right atrium: Moderately dilated at 26 ml/m2. - Atrial septum: Suggestion of atrial septal defect by colordoppler. - Inferior vena cava: The vessel was normal in size. The respirophasic diameter changes were in the normal range (>= 50%), consistent with normal central venous pressure. Impressions: Compared to the prior study in 02/16/14, the LV wall thickness has progressed to 2.2 cm. EF has increased to 60-65%. (According to 02/23/14 note by Dr. Marlou Porch, "With LVH, consider amyloidosis however hypertension most likely reason."  03/04/14 Nuclear stress test: Overall Impression: Normal stress nuclear study. No evidence of ischemia. LV Ejection Fraction: 46%. LV Wall Motion: NL LV Function; NL Wall Motion.  04/18/15 1V CXR: Shallow inspiration. No evidence of active pulmonary disease.  He is for BMET, CBC, PT/PTT. I will also add HFP with his known liver disease. Dr. Oneida Alar will determine if he would like to proceed as planned following these results.  In addition, he will be evaluated by his surgeon and anesthesiologist on the day of surgery to ensure BP is  controlled and that he is not having any acute cardiopulmonary/CHF symptoms. According to documentation is Epic he is still drinking ETOH. Hopefully he is being compliant with his medication regimen.    [facility] Short Stay Center/Anesthesiology Phone [phone] 05/06/2015 1:01 PM

## 2015-05-09 MED ORDER — DEXTROSE 5 % IV SOLN
1.5000 g | INTRAVENOUS | Status: AC
  Administered 2015-05-10: 1.5 g via INTRAVENOUS
  Filled 2015-05-09: qty 1.5

## 2015-05-09 MED ORDER — SODIUM CHLORIDE 0.9 % IV SOLN
INTRAVENOUS | Status: DC
  Administered 2015-05-10: 07:00:00 via INTRAVENOUS

## 2015-05-09 MED ORDER — CHLORHEXIDINE GLUCONATE CLOTH 2 % EX PADS: 6.0000 | MEDICATED_PAD | Freq: Once | CUTANEOUS | Status: DC

## 2015-05-09 MED ORDER — CHLORHEXIDINE GLUCONATE CLOTH 2 % EX PADS
6.0000 | MEDICATED_PAD | Freq: Once | CUTANEOUS | Status: DC
Start: 2015-05-09 — End: 2015-05-10

## 2015-05-10 ENCOUNTER — Encounter (HOSPITAL_COMMUNITY)
Admission: RE | Disposition: A | Discharge status: Home / Self Care | Payer: Self-pay | Source: Ambulatory Visit | Attending: Vascular Surgery

## 2015-05-10 ENCOUNTER — Ambulatory Visit (HOSPITAL_COMMUNITY): Payer: Medicaid Other | Admitting: Vascular Surgery

## 2015-05-10 ENCOUNTER — Encounter (HOSPITAL_COMMUNITY): Payer: Self-pay | Admitting: *Deleted

## 2015-05-10 ENCOUNTER — Ambulatory Visit (HOSPITAL_COMMUNITY)
Admission: RE | Admit: 2015-05-10 | Discharge: 2015-05-10 | Disposition: A | Discharge status: Home / Self Care | Payer: Medicaid Other | Source: Ambulatory Visit | Attending: Vascular Surgery | Admitting: Vascular Surgery

## 2015-05-10 ENCOUNTER — Other Ambulatory Visit: Payer: Self-pay | Admitting: *Deleted

## 2015-05-10 DIAGNOSIS — K7460 Unspecified cirrhosis of liver: Secondary | ICD-10-CM | POA: Diagnosis not present

## 2015-05-10 DIAGNOSIS — D649 Anemia, unspecified: Secondary | ICD-10-CM | POA: Diagnosis not present

## 2015-05-10 DIAGNOSIS — I120 Hypertensive chronic kidney disease with stage 5 chronic kidney disease or end stage renal disease: Secondary | ICD-10-CM | POA: Insufficient documentation

## 2015-05-10 DIAGNOSIS — I8500 Esophageal varices without bleeding: Secondary | ICD-10-CM | POA: Diagnosis not present

## 2015-05-10 DIAGNOSIS — F17210 Nicotine dependence, cigarettes, uncomplicated: Secondary | ICD-10-CM | POA: Insufficient documentation

## 2015-05-10 DIAGNOSIS — N186 End stage renal disease: Secondary | ICD-10-CM

## 2015-05-10 DIAGNOSIS — F1910 Other psychoactive substance abuse, uncomplicated: Secondary | ICD-10-CM | POA: Diagnosis not present

## 2015-05-10 DIAGNOSIS — Z4931 Encounter for adequacy testing for hemodialysis: Secondary | ICD-10-CM

## 2015-05-10 DIAGNOSIS — Z6831 Body mass index (BMI) 31.0-31.9, adult: Secondary | ICD-10-CM | POA: Insufficient documentation

## 2015-05-10 DIAGNOSIS — N185 Chronic kidney disease, stage 5: Secondary | ICD-10-CM | POA: Diagnosis not present

## 2015-05-10 DIAGNOSIS — D689 Coagulation defect, unspecified: Secondary | ICD-10-CM | POA: Insufficient documentation

## 2015-05-10 DIAGNOSIS — D696 Thrombocytopenia, unspecified: Secondary | ICD-10-CM | POA: Insufficient documentation

## 2015-05-10 DIAGNOSIS — I517 Cardiomegaly: Secondary | ICD-10-CM | POA: Insufficient documentation

## 2015-05-10 HISTORY — DX: Bipolar disorder, unspecified: F31.9

## 2015-05-10 HISTORY — DX: Presence of other vascular implants and grafts: Z95.828

## 2015-05-10 HISTORY — PX: AV FISTULA PLACEMENT: SHX1204

## 2015-05-10 HISTORY — DX: Inflammatory liver disease, unspecified: K75.9

## 2015-05-10 LAB — CBC
HCT: 28.5 % — ABNORMAL LOW (ref 39.0–52.0)
HEMOGLOBIN: 10.3 g/dL — AB (ref 13.0–17.0)
MCH: 31 pg (ref 26.0–34.0)
MCHC: 36.1 g/dL — AB (ref 30.0–36.0)
MCV: 85.8 fL (ref 78.0–100.0)
PLATELETS: 130 10*3/uL — AB (ref 150–400)
RBC: 3.32 MIL/uL — ABNORMAL LOW (ref 4.22–5.81)
RDW: 13.4 % (ref 11.5–15.5)
WBC: 7.8 10*3/uL (ref 4.0–10.5)

## 2015-05-10 LAB — BASIC METABOLIC PANEL
ANION GAP: 12 (ref 5–15)
BUN: 75 mg/dL — ABNORMAL HIGH (ref 6–20)
CHLORIDE: 110 mmol/L (ref 101–111)
CO2: 15 mmol/L — ABNORMAL LOW (ref 22–32)
CREATININE: 3.78 mg/dL — AB (ref 0.61–1.24)
Calcium: 9.2 mg/dL (ref 8.9–10.3)
GFR, EST AFRICAN AMERICAN: 19 mL/min — AB (ref >60)
GFR, EST NON AFRICAN AMERICAN: 17 mL/min — AB (ref >60)
Glucose, Bld: 108 mg/dL — ABNORMAL HIGH (ref 65–99)
Potassium: 4.1 mmol/L (ref 3.5–5.1)
Sodium: 137 mmol/L (ref 135–145)

## 2015-05-10 LAB — HEPATIC FUNCTION PANEL
ALK PHOS: 97 U/L (ref 38–126)
ALT: 15 U/L — AB (ref 17–63)
AST: 26 U/L (ref 15–41)
Albumin: 3 g/dL — ABNORMAL LOW (ref 3.5–5.0)
BILIRUBIN DIRECT: 0.2 mg/dL (ref 0.1–0.5)
BILIRUBIN INDIRECT: 0.6 mg/dL (ref 0.3–0.9)
Total Bilirubin: 0.8 mg/dL (ref 0.3–1.2)
Total Protein: 8.4 g/dL — ABNORMAL HIGH (ref 6.5–8.1)

## 2015-05-10 LAB — APTT: aPTT: 35 seconds (ref 24–37)

## 2015-05-10 LAB — PROTIME-INR
INR: 1.28 (ref 0.00–1.49)
PROTHROMBIN TIME: 16.1 s — AB (ref 11.6–15.2)

## 2015-05-10 SURGERY — ARTERIOVENOUS (AV) FISTULA CREATION
Anesthesia: General | Site: Arm Upper | Laterality: Left

## 2015-05-10 MED ORDER — FENTANYL CITRATE (PF) 100 MCG/2ML IJ SOLN
INTRAMUSCULAR | Status: DC | PRN
  Administered 2015-05-10: 25 ug via INTRAVENOUS
  Administered 2015-05-10 (×3): 50 ug via INTRAVENOUS

## 2015-05-10 MED ORDER — HEPARIN SODIUM (PORCINE) 1000 UNIT/ML IJ SOLN
INTRAMUSCULAR | Status: AC
  Filled 2015-05-10: qty 1

## 2015-05-10 MED ORDER — ONDANSETRON HCL 4 MG/2ML IJ SOLN
INTRAMUSCULAR | Status: DC | PRN
  Administered 2015-05-10: 4 mg via INTRAVENOUS

## 2015-05-10 MED ORDER — 0.9 % SODIUM CHLORIDE (POUR BTL) OPTIME
TOPICAL | Status: DC | PRN
  Administered 2015-05-10: 1000 mL

## 2015-05-10 MED ORDER — GLYCOPYRROLATE 0.2 MG/ML IJ SOLN
INTRAMUSCULAR | Status: AC
  Filled 2015-05-10: qty 1

## 2015-05-10 MED ORDER — OXYCODONE HCL 5 MG/5ML PO SOLN: 5.0000 mg | Freq: Once | ORAL | Status: DC | PRN

## 2015-05-10 MED ORDER — LIDOCAINE HCL (CARDIAC) 20 MG/ML IV SOLN
INTRAVENOUS | Status: AC
  Filled 2015-05-10: qty 5

## 2015-05-10 MED ORDER — OXYCODONE HCL 5 MG PO TABS: 5.0000 mg | ORAL_TABLET | Freq: Once | ORAL | Status: DC | PRN

## 2015-05-10 MED ORDER — ONDANSETRON HCL 4 MG/2ML IJ SOLN
INTRAMUSCULAR | Status: AC
  Filled 2015-05-10: qty 2

## 2015-05-10 MED ORDER — HYDROMORPHONE HCL 1 MG/ML IJ SOLN: 0.2500 mg | INTRAMUSCULAR | Status: DC | PRN

## 2015-05-10 MED ORDER — PROPOFOL 10 MG/ML IV BOLUS
INTRAVENOUS | Status: AC
  Filled 2015-05-10: qty 20

## 2015-05-10 MED ORDER — OXYCODONE HCL 5 MG PO TABS: 5.0000 mg | ORAL_TABLET | Freq: Four times a day (QID) | ORAL | Status: DC | PRN

## 2015-05-10 MED ORDER — SODIUM CHLORIDE 0.9 % IV SOLN
INTRAVENOUS | Status: DC | PRN
  Administered 2015-05-10: 09:00:00 via INTRAVENOUS

## 2015-05-10 MED ORDER — LIDOCAINE HCL (CARDIAC) 20 MG/ML IV SOLN
INTRAVENOUS | Status: DC | PRN
  Administered 2015-05-10: 60 mg via INTRAVENOUS

## 2015-05-10 MED ORDER — PHENYLEPHRINE HCL 10 MG/ML IJ SOLN
10.0000 mg | INTRAVENOUS | Status: DC | PRN
  Administered 2015-05-10: 40 ug/min via INTRAVENOUS

## 2015-05-10 MED ORDER — PHENYLEPHRINE HCL 10 MG/ML IJ SOLN
INTRAMUSCULAR | Status: DC | PRN
Start: 2015-05-10 — End: 2015-05-10
  Administered 2015-05-10: 40 ug via INTRAVENOUS
  Administered 2015-05-10 (×3): 80 ug via INTRAVENOUS
  Administered 2015-05-10: 40 ug via INTRAVENOUS

## 2015-05-10 MED ORDER — SODIUM CHLORIDE 0.9 % IR SOLN
Status: DC | PRN
  Administered 2015-05-10: 10:00:00

## 2015-05-10 MED ORDER — FENTANYL CITRATE (PF) 250 MCG/5ML IJ SOLN
INTRAMUSCULAR | Status: AC
  Filled 2015-05-10: qty 5

## 2015-05-10 MED ORDER — HEPARIN SODIUM (PORCINE) 1000 UNIT/ML IJ SOLN
INTRAMUSCULAR | Status: DC | PRN
  Administered 2015-05-10: 5000 [IU] via INTRAVENOUS

## 2015-05-10 MED ORDER — PROMETHAZINE HCL 25 MG/ML IJ SOLN
6.2500 mg | INTRAMUSCULAR | Status: DC | PRN
Start: 2015-05-10 — End: 2015-05-10

## 2015-05-10 MED ORDER — PROTAMINE SULFATE 10 MG/ML IV SOLN
INTRAVENOUS | Status: AC
  Filled 2015-05-10: qty 5

## 2015-05-10 MED ORDER — GLYCOPYRROLATE 0.2 MG/ML IJ SOLN
INTRAMUSCULAR | Status: DC | PRN
  Administered 2015-05-10: 0.2 mg via INTRAVENOUS

## 2015-05-10 MED ORDER — PROPOFOL 10 MG/ML IV BOLUS
INTRAVENOUS | Status: DC | PRN
  Administered 2015-05-10: 40 mg via INTRAVENOUS
  Administered 2015-05-10: 140 mg via INTRAVENOUS
  Administered 2015-05-10: 60 mg via INTRAVENOUS

## 2015-05-10 SURGICAL SUPPLY — 28 items
ARMBAND PINK RESTRICT EXTREMIT (MISCELLANEOUS) ×2 IMPLANT
CANISTER SUCTION 2500CC (MISCELLANEOUS) ×2 IMPLANT
CANNULA VESSEL 3MM 2 BLNT TIP (CANNULA) ×2 IMPLANT
CLIP TI MEDIUM 6 (CLIP) ×2 IMPLANT
CLIP TI WIDE RED SMALL 6 (CLIP) ×2 IMPLANT
COVER PROBE W GEL 5X96 (DRAPES) IMPLANT
DECANTER SPIKE VIAL GLASS SM (MISCELLANEOUS) ×1 IMPLANT
DRAIN PENROSE 1/4X12 LTX STRL (WOUND CARE) ×2 IMPLANT
ELECT REM PT RETURN 9FT ADLT (ELECTROSURGICAL) ×2
ELECTRODE REM PT RTRN 9FT ADLT (ELECTROSURGICAL) ×1 IMPLANT
GLOVE BIO SURGEON STRL SZ7.5 (GLOVE) ×2 IMPLANT
GOWN STRL REUS W/ TWL LRG LVL3 (GOWN DISPOSABLE) ×3 IMPLANT
GOWN STRL REUS W/TWL LRG LVL3 (GOWN DISPOSABLE) ×6
KIT BASIN OR (CUSTOM PROCEDURE TRAY) ×2 IMPLANT
KIT ROOM TURNOVER OR (KITS) ×2 IMPLANT
LIQUID BAND (GAUZE/BANDAGES/DRESSINGS) ×2 IMPLANT
LOOP VESSEL MINI RED (MISCELLANEOUS) IMPLANT
NS IRRIG 1000ML POUR BTL (IV SOLUTION) ×2 IMPLANT
PACK CV ACCESS (CUSTOM PROCEDURE TRAY) ×2 IMPLANT
PAD ARMBOARD 7.5X6 YLW CONV (MISCELLANEOUS) ×4 IMPLANT
SPONGE SURGIFOAM ABS GEL 100 (HEMOSTASIS) IMPLANT
SUT PROLENE 6 0 BV (SUTURE) ×1 IMPLANT
SUT PROLENE 7 0 BV 1 (SUTURE) IMPLANT
SUT VIC AB 3-0 SH 27 (SUTURE) ×2
SUT VIC AB 3-0 SH 27X BRD (SUTURE) ×1 IMPLANT
SUT VICRYL 4-0 PS2 18IN ABS (SUTURE) ×2 IMPLANT
UNDERPAD 30X30 INCONTINENT (UNDERPADS AND DIAPERS) ×2 IMPLANT
WATER STERILE IRR 1000ML POUR (IV SOLUTION) ×1 IMPLANT

## 2015-05-10 NOTE — Anesthesia Procedure Notes (Signed)
Procedure Name: LMA Insertion Date/Time: 05/10/2015 9:42 AM Performed by: Susa Loffler Pre-anesthesia Checklist: Patient identified, Timeout performed, Emergency Drugs available, Suction available and Patient being monitored Patient Re-evaluated:Patient Re-evaluated prior to inductionOxygen Delivery Method: Circle system utilized Preoxygenation: Pre-oxygenation with 100% oxygen Intubation Type: IV induction LMA: LMA inserted LMA Size: 4.0 Number of attempts: 1 Placement Confirmation: positive ETCO2 and breath sounds checked- equal and bilateral Tube secured with: Tape Dental Injury: Teeth and Oropharynx as per pre-operative assessment

## 2015-05-10 NOTE — Op Note (Signed)
Procedure: Left Brachial Cephalic AV fistula  Preop: ESRD  Postop: ESRD  Anesthesia: General  Assistant:  Leontine Locket PA-c  Findings: 3.5 mm cephalic vein  Procedure: After obtaining informed consent, the patient was taken to the operating room.  After induction of general anesthesia, the left upper extremity was prepped and draped in usual sterile fashion.  A transverse incision was then made near the antecubital crease the left arm. The incision was carried into the subcutaneous tissues down to level of the cephalic vein. The cephalic vein was approximately 3.5 mm in diameter. It was of good quality. This was dissected free circumferentially and small side branches ligated and divided between silk ties or clips. Next the brachial artery was dissected free in the medial portion of the incision. The artery was  3-4 mm in diameter. The vessel loops were placed proximal and distal to the planned site of arteriotomy. The patient was given 5000 units of intravenous heparin. After appropriate circulation time, the vessel loops were used to control the artery. A longitudinal opening was made in the brachial artery.  The vein was ligated distally with a 2-0 silk tie. The vein was controlled proximally with a fine bulldog clamp. The vein was then swung over to the artery and sewn end of vein to side of artery using a running 6-0 Prolene suture. Just prior to completion of the anastomosis, everything was forebled back bled and thoroughly flushed. The anastomosis was secured, vessel loops released, and there was a palpable thrill in the fistula immediately. After hemostasis was obtained, the subcutaneous tissues were reapproximated using a running 3-0 Vicryl suture. The skin was then closed with a 4 Vicryl subcuticular stitch. Dermabond was applied to the skin incision.  The patient had a palpable radial pulse at the end of the case.  Ruta Hinds, MD [facility] Office:  [phone] Pager: [phone]

## 2015-05-10 NOTE — Interval H&P Note (Signed)
History and Physical Interval Note:  05/10/2015 7:21 AM  Edwin Love  has presented today for surgery, with the diagnosis of Stage III Chronic Kidney Disease N18.3  The various methods of treatment have been discussed with the patient and family. After consideration of risks, benefits and other options for treatment, the patient has consented to  Procedure(s): ARTERIOVENOUS (AV) FISTULA CREATION (Left) as a surgical intervention .  The patient's history has been reviewed, patient examined, no change in status, stable for surgery.  I have reviewed the patient's chart and labs.  Questions were answered to the patient's satisfaction.     Ruta Hinds

## 2015-05-10 NOTE — Discharge Instructions (Signed)
° ° °  05/10/2015 JENIEL SLAUSON [id number] Jul 31, 1961  Surgeon(s): Elam Dutch, MD  Procedure(s): ARTERIOVENOUS (AV) FISTULA CREATION-left brachiocephalic AV Fistula  x Do not stick fistula for 12 weeks

## 2015-05-10 NOTE — H&P (View-Only) (Signed)
[facility] HISTORY AND PHYSICAL   History of Present Illness:  Patient is a 54 y.o. year old male who presents for placement of a permanent hemodialysis access. The patient is right handed.  The patient is not currently on hemodialysis.  The cause of renal failure is thought to be secondary to possibly hepatitis C.  patient has had a prior TIPS procedure. He also has a history of coagulopathy esophageal varices and thrombocytopenia. Other chronic medical problems include hypertension, CK D4 at this point, cirrhosis.  Past Medical History  Diagnosis Date  . Hypertension   . Back pain   . Renal disorder     CKD stage 3 noted in 02/2014 H & P  . Anemia 01/2014  . LVH (left ventricular hypertrophy) 02/2014    grade 2 diastolic dysfunction.   . Ankle fracture, left 10/2008    non surgical mgt.   . Thrombocytopenia 06/2014  . Coagulopathy 06/2014  . Esophageal varices   . GI bleed   . Cirrhosis     Past Surgical History  Procedure Laterality Date  . Eye surgery Left 1990's    Foreign body  . Inguinal hernia repair Right 02/08/2014    Procedure: HERNIA REPAIR INGUINAL INCARCERATED;  Surgeon: Edward Jolly, MD;  Location: [place];  Service: General;  Laterality: Right;  . Insertion of mesh Right 02/08/2014    Procedure: INSERTION OF MESH;  Surgeon: Edward Jolly, MD;  Location: [place];  Service: General;  Laterality: Right;  . Hernia repair    . Esophagogastroduodenoscopy N/A 06/24/2014    Procedure: ESOPHAGOGASTRODUODENOSCOPY (EGD);  Surgeon: Inda Castle, MD;  Location: [facility];  Service: Endoscopy;  Laterality: N/A;  . Colonoscopy N/A 06/25/2014    Procedure: COLONOSCOPY;  Surgeon: Inda Castle, MD;  Location: [facility];  Service: Endoscopy;  Laterality: N/A;  . Hemorrhoid surgery N/A 06/25/2014    Procedure: oversew of rectal varices;  Surgeon: Harl Bowie, MD;  Location: [place];  Service: General;  Laterality: N/A;  . Radiology with  anesthesia N/A 07/02/2014    Procedure: RADIOLOGY WITH ANESTHESIA;  Surgeon: Jacqulynn Cadet, MD;  Location: [facility];  Service: Radiology;  Laterality: N/A;     Social History History  Substance Use Topics  . Smoking status: Current Every Day Smoker -- 0.30 packs/day for 34 years    Types: Cigarettes  . Smokeless tobacco: Never Used     Comment: Smoke since 54 years old  . Alcohol Use: 0.0 oz/week    0 Standard drinks or equivalent per week     Comment: 40 oz every 2-3 days    Family History Family History  Problem Relation Age of Onset  . Diabetes Brother     Mother too  . Hypertension      family  . Sickle cell anemia Sister     Allergies  No Known Allergies   Current Outpatient Prescriptions  Medication Sig Dispense Refill  . amLODipine (NORVASC) 10 MG tablet Take 1 tablet (10 mg total) by mouth daily. 30 tablet 0  . carvedilol (COREG) 25 MG tablet Take 1 tablet (25 mg total) by mouth 2 (two) times daily with a meal. 60 tablet 0  . furosemide (LASIX) 40 MG tablet Take 1 tablet (40 mg total) by mouth 2 (two) times daily. 30 tablet 0  . potassium chloride (K-DUR) 10 MEQ tablet Take 1 tablet (10 mEq total) by mouth daily. 15 tablet 0   No current facility-administered  medications for this visit.    ROS:   General:  No weight loss, Fever, chills  HEENT: No recent headaches, no nasal bleeding, no visual changes, no sore throat  Neurologic: No dizziness, blackouts, seizures. No recent symptoms of stroke or mini- stroke. No recent episodes of slurred speech, or temporary blindness.  Cardiac: No recent episodes of chest pain/pressure, no shortness of breath at rest.  No shortness of breath with exertion.  Denies history of atrial fibrillation or irregular heartbeat  Vascular: No history of rest pain in feet.  No history of claudication.  No history of non-healing ulcer, No history of DVT   Pulmonary: No home oxygen, no productive cough, no hemoptysis,  No asthma or  wheezing  Musculoskeletal:  [ ]  Arthritis, [ ]  Low back pain,  [ ]  Joint pain  Hematologic:No history of hypercoagulable state.  No history of easy bleeding.  No history of anemia  Gastrointestinal: No hematochezia or melena,  No gastroesophageal reflux, no trouble swallowing  Urinary: [x ] chronic Kidney disease, [ ]  on HD - [ ]  MWF or [ ]  TTHS, [ ]  Burning with urination, [ ]  Frequent urination, [ ]  Difficulty urinating;   Skin: No rashes  Psychological: No history of anxiety,  No history of depression   Physical Examination  Filed Vitals:   05/05/15 1217  BP: 125/73  Pulse: 66  Temp: 98.2 F (36.8 C)  TempSrc: Oral  Resp: 16  Height: 5\' 9"  (1.753 m)  Weight: 210 lb (95.255 kg)  SpO2: 99%    Body mass index is 31 kg/(m^2).  General:  Alert and oriented, no acute distress HEENT: Normal Neck: No bruit or JVD Pulmonary: Clear to auscultation bilaterally Cardiac: Regular Rate and Rhythm without murmur Gastrointestinal: Soft, non-tender, non-distended, no mass, no scars, distended with ascites Skin: No rash Extremity Pulses:  2+ radial, brachial pulses bilaterally Musculoskeletal: No deformity or edema  Neurologic: Upper and lower extremity motor 5/5 and symmetric  DATA: Patient had a vein mapping ultrasound today which showed the cephalic vein in the right forearm was 3 mm the right radial artery was 3 mm the left cephalic vein was small in the forearm is greater than 4 mm in the upper arm brachial artery was 5 mm in the left arm. I reviewed and interpreted this study.   ASSESSMENT: Patient needs long-term hemodialysis access. His anatomy is suitable for a left brachiocephalic AV fistula especially since he is right-hand dominant.   PLAN:  Left arm AV fistula on 05/10/2015. Risks benefits possible competitions procedure details were expended the patient today including but not limited to bleeding infection ischemic steal non-maturation of the fistula. We will need to  obtain a new CBC to check his platelet count as well as a PT and PTT to check his coagulation factors to make sure that he has reasonable clotting factors to undergo an operation. These will be scheduled for the day of his operation. If his clotting factors are sufficiently out of range we may need to consider rescheduling him when platelets and fresh frozen plasma would be available.  Ruta Hinds, MD [facility] Office: [phone] Pager: [phone]

## 2015-05-10 NOTE — Anesthesia Postprocedure Evaluation (Signed)
  Anesthesia Post-op Note  Patient: Edwin Love  Procedure(s) Performed: Procedure(s): Left Upper arm Brachiocephalic ARTERIOVENOUS (AV) FISTULA CREATION (Left)  Patient Location: PACU  Anesthesia Type:General  Level of Consciousness: awake, alert  and oriented  Airway and Oxygen Therapy: Patient Spontanous Breathing  Post-op Pain: mild  Post-op Assessment: Post-op Vital signs reviewed  Post-op Vital Signs: Reviewed  Last Vitals:  Filed Vitals:   05/10/15 1141  BP: 126/69  Pulse: 64  Temp:   Resp: 19    Complications: No apparent anesthesia complications

## 2015-05-10 NOTE — Transfer of Care (Signed)
Immediate Anesthesia Transfer of Care Note  Patient: Edwin Love  Procedure(s) Performed: Procedure(s): Left Upper arm Brachiocephalic ARTERIOVENOUS (AV) FISTULA CREATION (Left)  Patient Location: PACU  Anesthesia Type:General  Level of Consciousness: awake, alert  and oriented  Airway & Oxygen Therapy: Patient Spontanous Breathing and Patient connected to nasal cannula oxygen  Post-op Assessment: Report given to RN and Post -op Vital signs reviewed and stable  Post vital signs: Reviewed and stable  Last Vitals:  Filed Vitals:   05/10/15 0752  BP: 136/80  Pulse: 65  Temp: 36.5 C  Resp: 16    Complications: No apparent anesthesia complications

## 2015-05-11 ENCOUNTER — Encounter (HOSPITAL_COMMUNITY): Payer: Self-pay | Admitting: Vascular Surgery

## 2015-05-11 ENCOUNTER — Telehealth: Payer: Self-pay | Admitting: Vascular Surgery

## 2015-05-11 NOTE — Telephone Encounter (Signed)
-----   Message from Mena Goes, RN sent at 05/10/2015 12:47 PM EDT ----- Regarding: Schedule   ----- Message -----    From: Dario Ave    Sent: 05/10/2015  12:18 PM      To: [facility] Charge Pool Subject: Kay's log                                        ----- Message -----    From: Gabriel Earing, PA-C    Sent: 05/10/2015  10:38 AM      To: [facility] Charge Pool  S/p left BC AVF 05/10/15.  F/u with Dr. Oneida Alar in 4-6 weeks with duplex.  Thanks, Aldona Bar

## 2015-05-11 NOTE — Telephone Encounter (Signed)
Left message for pt with his mother, dpm

## 2015-05-12 ENCOUNTER — Ambulatory Visit (INDEPENDENT_AMBULATORY_CARE_PROVIDER_SITE_OTHER): Payer: Medicaid Other | Admitting: Physician Assistant

## 2015-05-12 ENCOUNTER — Encounter: Payer: Self-pay | Admitting: Physician Assistant

## 2015-05-12 VITALS — BP 110/50 | HR 72 | Ht 69.0 in | Wt 209.0 lb

## 2015-05-12 DIAGNOSIS — N183 Chronic kidney disease, stage 3 unspecified: Secondary | ICD-10-CM

## 2015-05-12 DIAGNOSIS — I110 Hypertensive heart disease with heart failure: Secondary | ICD-10-CM

## 2015-05-12 DIAGNOSIS — I119 Hypertensive heart disease without heart failure: Secondary | ICD-10-CM | POA: Insufficient documentation

## 2015-05-12 DIAGNOSIS — K7030 Alcoholic cirrhosis of liver without ascites: Secondary | ICD-10-CM | POA: Diagnosis not present

## 2015-05-12 DIAGNOSIS — I509 Heart failure, unspecified: Secondary | ICD-10-CM

## 2015-05-12 DIAGNOSIS — I5032 Chronic diastolic (congestive) heart failure: Secondary | ICD-10-CM

## 2015-05-12 NOTE — Progress Notes (Addendum)
Cardiology Office Note   Date:  05/12/2015   ID:  MELQUAN ERNSBERGER, DOB May 25, 1961, MRN [id number]  Patient Care Team: Germain Osgood, PA-C as PCP - General (Pulmonary Disease) Jamal Maes, MD as Consulting Physician (Nephrology) Coralie Keens, MD as Consulting Physician (General Surgery) Jerline Pain, MD as Consulting Physician (Cardiology)    Chief Complaint  Patient presents with  . Congestive Heart Failure  . Hospitalization Follow-up     History of Present Illness: Edwin Love is a 54 y.o. male with a hx of hypertensive heart disease with severe LVH, diastolic HF, CKD, alcoholic cirrhosis with esophageal varices s/p transjugular intrahepatic portosystemic shunt, hepatitis C  Admitted 5/9-5/12 with acute on chronic diastolic CHF in the setting of medication nonadherence, acute on chronic renal failure and hypertensive emergency. He had minimally elevated troponins felt to be related to demand ischemia. Echocardiogram demonstrated normal LV function with mild diastolic dysfunction, severely thickened LV, mild MR and mild aortic subvalvular gradient (mean 12 mmHg). LVH was felt to mainly be from uncontrolled hypertension. He was followed by cardiology. He returns for follow-up. Of note he just had an AV fistula placed in his L arm in preparation for dialysis.    Patient is here alone today. He has significant slurred speech when I first entered the room. I asked if he was drinking alcohol and he responded, "I had a beer." He notes chronic dyspnea with exertion. He describes NYHA 3 symptoms. This is no worse. He denies orthopnea. He does admit to PND. He denies pedal edema. He has chronic atypical chest pain. He's had this for years without significant change. He also admits to indigestion. He denies syncope. He denies coughing, wheezing, melena, hematochezia.   Studies/Reports Reviewed Today:  Echo 04/19/15 - Left ventricle: The cavity size was normal. Wall  thickness wasseverely increased up to 2.2 cm. Consider infiltrative disaese orhypertrophic cardiomyopathy as a cause. EF 60%to 65%. Wall motion was normal; Grade 1 diastolicdysfunction - Aortic valve: Trileaflet. Elevated gradient without significantstenosis - mostly subvalvular.. There was no regurgitation. Meangradient (S): 12 mm Hg. Peak gradient (S): 28 mm Hg. - Mitral valve: Mildly thickened leaflets . There was mildregurgitation. - Left atrium: Severely dilated at 51 ml/m2. - Right atrium: Moderately dilated at 26 ml/m2. - Atrial septum: Suggestion of atrial septal defect by colordoppler. Impressions:- Compared to the prior study in 2015, the LV wall thickness hasprogressed to 2.2 cm. EF has increased to 60-65%.  Myoview 02/23/14 Overall Impression: Normal stress nuclear study. No evidence of ischemia.  LV Ejection Fraction: 46%. LV Wall Motion: NL LV Function; NL Wall Motion.   Past Medical History  Diagnosis Date  . Hypertension   . Back pain   . Renal disorder     CKD stage 3 noted in 02/2014 H & P  . Anemia 01/2014  . LVH (left ventricular hypertrophy) 02/2014    grade 2 diastolic dysfunction.   . Ankle fracture, left 10/2008    non surgical mgt.   . Thrombocytopenia 06/2014  . Coagulopathy 06/2014  . Esophageal varices   . GI bleed   . Cirrhosis   . Bipolar disorder   . Hepatitis   . S/P TIPS (transjugular intrahepatic portosystemic shunt)     Past Surgical History  Procedure Laterality Date  . Eye surgery Left 1990's    Foreign body  . Inguinal hernia repair Right 02/08/2014    Procedure: HERNIA REPAIR INGUINAL INCARCERATED;  Surgeon: Edward Jolly, MD;  Location: [facility]  OR;  Service: General;  Laterality: Right;  . Insertion of mesh Right 02/08/2014    Procedure: INSERTION OF MESH;  Surgeon: Edward Jolly, MD;  Location: [place];  Service: General;  Laterality: Right;  . Hernia repair    . Esophagogastroduodenoscopy N/A 06/24/2014    Procedure:  ESOPHAGOGASTRODUODENOSCOPY (EGD);  Surgeon: Inda Castle, MD;  Location: [place];  Service: Endoscopy;  Laterality: N/A;  . Colonoscopy N/A 06/25/2014    Procedure: COLONOSCOPY;  Surgeon: Inda Castle, MD;  Location: [place];  Service: Endoscopy;  Laterality: N/A;  . Hemorrhoid surgery N/A 06/25/2014    Procedure: oversew of rectal varices;  Surgeon: Harl Bowie, MD;  Location: [facility] [facility];  Service: General;  Laterality: N/A;  . Radiology with anesthesia N/A 07/02/2014    Procedure: RADIOLOGY WITH ANESTHESIA;  Surgeon: Jacqulynn Cadet, MD;  Location: [place];  Service: Radiology;  Laterality: N/A;  . Av fistula placement Left 05/10/2015    Procedure: Left Upper arm Brachiocephalic ARTERIOVENOUS (AV) FISTULA CREATION;  Surgeon: Elam Dutch, MD;  Location: [facility] OR;  Service: Vascular;  Laterality: Left;     Current Outpatient Prescriptions  Medication Sig Dispense Refill  . amLODipine (NORVASC) 10 MG tablet Take 1 tablet (10 mg total) by mouth daily. 30 tablet 0  . carvedilol (COREG) 25 MG tablet Take 1 tablet (25 mg total) by mouth 2 (two) times daily with a meal. 60 tablet 0  . furosemide (LASIX) 40 MG tablet Take 1 tablet (40 mg total) by mouth 2 (two) times daily. 30 tablet 0  . oxyCODONE (ROXICODONE) 5 MG immediate release tablet Take 1 tablet (5 mg total) by mouth every 6 (six) hours as needed. 20 tablet 0  . potassium chloride (K-DUR) 10 MEQ tablet Take 1 tablet (10 mEq total) by mouth daily. 15 tablet 0   No current facility-administered medications for this visit.    Allergies:   Review of patient's allergies indicates no known allergies.    Social History:  The patient  reports that he has been smoking Cigarettes.  He has a 10.2 pack-year smoking history. He has never used smokeless tobacco. He reports that he drinks alcohol. He reports that he uses illicit drugs.   Family History:  The patient's family history includes Diabetes in his brother; Hypertension in an  other family member; Sickle cell anemia in his sister. There is no history of Heart attack or Stroke.    ROS:   Please see the history of present illness.   Review of Systems  All other systems reviewed and are negative.     PHYSICAL EXAM: VS:  BP 110/50 mmHg  Pulse 72  Ht 5\' 9"  (1.753 m)  Wt 209 lb (94.802 kg)  BMI 30.85 kg/m2    Wt Readings from Last 3 Encounters:  05/12/15 209 lb (94.802 kg)  05/10/15 210 lb (95.255 kg)  05/05/15 210 lb (95.255 kg)     GEN: Well nourished, well developed, in no acute distress HEENT: normal Neck: + JVD,   no masses Cardiac:  Normal S1/S2, RRR; 8-5/4 systolic murmur along the LSB,  no rubs or gallops, no edema   Respiratory:  clear to auscultation bilaterally, no wheezing, rhonchi or rales. GI: soft, nontender, nondistended, + BS MS: no deformity or atrophy Skin: warm and dry  Neuro:  CNs II-XII intact, Strength and sensation are intact Psych: Normal affect   EKG:  EKG is ordered today.  It demonstrates:   NSR, HR 72, normal axis,  IVCD, LVH with repol abnormality, no changes.    Recent Labs: 06/29/2014: Magnesium 1.3* 04/18/2015: B Natriuretic Peptide 3344.2*; TSH 3.373 05/10/2015: ALT 15*; BUN 75*; Creatinine 3.78*; Hemoglobin 10.3*; Platelets 130*; Potassium 4.1; Sodium 137    Lipid Panel    Component Value Date/Time   TRIG 62 06/25/2014 1130      ASSESSMENT AND PLAN:  Hypertensive heart disease with heart failure:  His recent echocardiogram demonstrated severe LVH with normal LV function and mild diastolic dysfunction. He did have an elevated gradient at the aortic valve without significant stenosis. The gradient was mostly subvalvular. Comments from the echo report include consider infiltrative disease versus hypertrophic cardiomyopathy. As noted, it was felt that his LVH was most likely related to untreated hypertension during his hospitalization. At this point, I'm not certain that there is any further workup that we would need  to accomplish.  I will review this with Dr. Marlou Porch. Currently his blood pressure is controlled. I explained to him the importance of remaining on his current medical therapy. Addendum 05/23/2015 4:36 PM:  Reviewed with Dr. Candee Furbish.  No further evaluation of Hypertrophic CM is recommended at this time. Richardson Dopp, PA-C  05/23/2015 4:37 PM    Chronic diastolic CHF (congestive heart failure): Volume appears stable. Continue current dose of Lasix. He is followed closely by nephrology. Apparently, he will be starting dialysis at some point in the near future.  CKD (chronic kidney disease) stage 3, GFR 30-59 ml/min: Follow-up with nephrology as planned.  Recent creatinine fairly stable.  Alcoholic cirrhosis of liver without ascites: Follow-up with gastroenterology as planned. I advised him to refrain from alcohol use.     Current medicines are reviewed at length with the patient today.  Concerns regarding medicines are as outlined above.  The following changes have been made:    None    Labs/ tests ordered today include:  Orders Placed This Encounter  Procedures  . EKG 12-Lead    Disposition:   FU with Dr. Candee Furbish 1-2 mos.   Signed, Versie Starks, MHS 05/12/2015 5:44 PM    [facility] [address], [place], [place]  [address] Phone: [phone]; Fax: [phone]

## 2015-05-12 NOTE — Patient Instructions (Signed)
Medication Instructions:  Your physician recommends that you continue on your current medications as directed. Please refer to the Current Medication list given to you today.   Labwork: NONE  Testing/Procedures: NONE  Follow-Up: DR Marlou Porch 06/1415 @ 10:15  Any Other Special Instructions Will Be Listed Below (If Applicable).

## 2015-05-18 ENCOUNTER — Other Ambulatory Visit (HOSPITAL_COMMUNITY): Payer: Self-pay | Admitting: Interventional Radiology

## 2015-05-18 ENCOUNTER — Other Ambulatory Visit: Payer: Self-pay | Admitting: Radiology

## 2015-05-18 ENCOUNTER — Encounter: Payer: Self-pay | Admitting: Radiology

## 2015-05-18 DIAGNOSIS — Z95828 Presence of other vascular implants and grafts: Secondary | ICD-10-CM

## 2015-05-18 DIAGNOSIS — C220 Liver cell carcinoma: Secondary | ICD-10-CM

## 2015-05-18 DIAGNOSIS — K7030 Alcoholic cirrhosis of liver without ascites: Secondary | ICD-10-CM

## 2015-05-18 DIAGNOSIS — B1920 Unspecified viral hepatitis C without hepatic coma: Secondary | ICD-10-CM

## 2015-05-31 LAB — COMPREHENSIVE METABOLIC PANEL
ALK PHOS: 99 U/L (ref 39–117)
ALT: 10 U/L (ref 0–53)
AST: 24 U/L (ref 0–37)
Albumin: 3.1 g/dL — ABNORMAL LOW (ref 3.5–5.2)
BILIRUBIN TOTAL: 0.7 mg/dL (ref 0.2–1.2)
BUN: 49 mg/dL — ABNORMAL HIGH (ref 6–23)
CO2: 12 meq/L — AB (ref 19–32)
Calcium: 8.4 mg/dL (ref 8.4–10.5)
Chloride: 104 mEq/L (ref 96–112)
Creat: 3.02 mg/dL — ABNORMAL HIGH (ref 0.50–1.35)
GLUCOSE: 90 mg/dL (ref 70–99)
POTASSIUM: 4.1 meq/L (ref 3.5–5.3)
SODIUM: 132 meq/L — AB (ref 135–145)
Total Protein: 7.2 g/dL (ref 6.0–8.3)

## 2015-05-31 LAB — PROTIME-INR
INR: 1.4 (ref <1.50)
PROTHROMBIN TIME: 17.2 s — AB (ref 11.6–15.2)

## 2015-05-31 LAB — AFP TUMOR MARKER: AFP TUMOR MARKER: 4.7 ng/mL (ref <6.1)

## 2015-05-31 LAB — CBC
HCT: 27.2 % — ABNORMAL LOW (ref 39.0–52.0)
Hemoglobin: 9.2 g/dL — ABNORMAL LOW (ref 13.0–17.0)
MCH: 30.6 pg (ref 26.0–34.0)
MCHC: 33.8 g/dL (ref 30.0–36.0)
MCV: 90.4 fL (ref 78.0–100.0)
MPV: 10.5 fL (ref 8.6–12.4)
PLATELETS: 117 10*3/uL — AB (ref 150–400)
RBC: 3.01 MIL/uL — AB (ref 4.22–5.81)
RDW: 14.9 % (ref 11.5–15.5)
WBC: 6.2 10*3/uL (ref 4.0–10.5)

## 2015-06-09 ENCOUNTER — Ambulatory Visit
Admission: RE | Admit: 2015-06-09 | Discharge: 2015-06-09 | Disposition: A | Discharge status: Home / Self Care | Payer: Medicaid Other | Source: Ambulatory Visit | Attending: Interventional Radiology | Admitting: Interventional Radiology

## 2015-06-09 DIAGNOSIS — B1920 Unspecified viral hepatitis C without hepatic coma: Secondary | ICD-10-CM

## 2015-06-09 DIAGNOSIS — K7030 Alcoholic cirrhosis of liver without ascites: Secondary | ICD-10-CM

## 2015-06-09 DIAGNOSIS — Z95828 Presence of other vascular implants and grafts: Secondary | ICD-10-CM

## 2015-06-09 NOTE — Progress Notes (Signed)
Chief Complaint: Cirrhosis  Referring Physician(s): Vanette Noguchi  History of Present Illness: Edwin Love is a 54 y.o. male with alcoholic and HCV cirrhosis consultative by life-threatening hemorrhage requiring emergent surgical ligation of bleeding rectal varices. After stabilization, the patient underwent creation of a TIPS as well as coil embolization of the portosystemic collaterals on 07/05/2014.  Additionally, he has small lesions in the right hepatic dome which we are following. He presents today for routine TIPS follow-up evaluation. His duplex ultrasound performed earlier today demonstrates no evidence of stenosis or other complicating feature.    He denies any hemoptysis or bright red blood per rectum. Also denies black or tarry stools. He has had some events since I saw him this past March. At that time, his large reevaluation demonstrated acute renal injury. He has now followed by Dr. Lorrene Reid of nephrology. Unfortunately, there is not much to be done for his renal dysfunction and he will likely need hemodialysis in the future. He has undergone placement of a left upper extremity brachiocephalic arteriovenous fistula by Dr. Oneida Alar.  Otherwise, he continues to do well. Because of his new renal dysfunction, he can no longer undergo CT scan or MRI with contrast material which limits our ability to follow the small lesions in his right hepatic dome. The imaging surveillance done thus far demonstrated no interval change and his AFP has began to trend back down into the normal limits.    Past Medical History  Diagnosis Date  . Hypertension   . Back pain   . Renal disorder     CKD stage 3 noted in 02/2014 H & P  . Anemia 01/2014  . LVH (left ventricular hypertrophy) 02/2014    grade 2 diastolic dysfunction.   . Ankle fracture, left 10/2008    non surgical mgt.   . Thrombocytopenia 06/2014  . Coagulopathy 06/2014  . Esophageal varices   . GI bleed   . Cirrhosis   .  Bipolar disorder   . Hepatitis   . S/P TIPS (transjugular intrahepatic portosystemic shunt)     Past Surgical History  Procedure Laterality Date  . Eye surgery Left 1990's    Foreign body  . Inguinal hernia repair Right 02/08/2014    Procedure: HERNIA REPAIR INGUINAL INCARCERATED;  Surgeon: Edward Jolly, MD;  Location: [place];  Service: General;  Laterality: Right;  . Insertion of mesh Right 02/08/2014    Procedure: INSERTION OF MESH;  Surgeon: Edward Jolly, MD;  Location: [place];  Service: General;  Laterality: Right;  . Hernia repair    . Esophagogastroduodenoscopy N/A 06/24/2014    Procedure: ESOPHAGOGASTRODUODENOSCOPY (EGD);  Surgeon: Inda Castle, MD;  Location: [place];  Service: Endoscopy;  Laterality: N/A;  . Colonoscopy N/A 06/25/2014    Procedure: COLONOSCOPY;  Surgeon: Inda Castle, MD;  Location: [place];  Service: Endoscopy;  Laterality: N/A;  . Hemorrhoid surgery N/A 06/25/2014    Procedure: oversew of rectal varices;  Surgeon: Harl Bowie, MD;  Location: [place];  Service: General;  Laterality: N/A;  . Radiology with anesthesia N/A 07/02/2014    Procedure: RADIOLOGY WITH ANESTHESIA;  Surgeon: Jacqulynn Cadet, MD;  Location: [place];  Service: Radiology;  Laterality: N/A;  . Av fistula placement Left 05/10/2015    Procedure: Left Upper arm Brachiocephalic ARTERIOVENOUS (AV) FISTULA CREATION;  Surgeon: Elam Dutch, MD;  Location: [place];  Service: Vascular;  Laterality: Left;    Allergies: Review of patient's allergies indicates no known allergies.  Medications: Prior to Admission medications   Medication Sig Start Date End Date Taking? Authorizing Provider  amLODipine (NORVASC) 10 MG tablet Take 1 tablet (10 mg total) by mouth daily. 04/22/15  Yes Velvet Bathe, MD  carvedilol (COREG) 25 MG tablet Take 1 tablet (25 mg total) by mouth 2 (two) times daily with a meal. 04/21/15  Yes Velvet Bathe, MD  furosemide (LASIX) 40 MG tablet Take 1 tablet  (40 mg total) by mouth 2 (two) times daily. 04/21/15  Yes Velvet Bathe, MD  oxyCODONE (ROXICODONE) 5 MG immediate release tablet Take 1 tablet (5 mg total) by mouth every 6 (six) hours as needed. 05/10/15  Yes Samantha J Rhyne, PA-C  potassium chloride (K-DUR) 10 MEQ tablet Take 1 tablet (10 mEq total) by mouth daily. 04/21/15  Yes Velvet Bathe, MD     Family History  Problem Relation Age of Onset  . Diabetes Brother     Mother too  . Hypertension      family  . Sickle cell anemia Sister   . Heart attack Neg Hx   . Stroke Neg Hx     History   Social History  . Marital Status: Single    Spouse Name: N/A  . Number of Children: N/A  . Years of Education: N/A   Social History Main Topics  . Smoking status: Current Every Day Smoker -- 0.30 packs/day for 34 years    Types: Cigarettes  . Smokeless tobacco: Never Used     Comment: Smoke since 54 years old  . Alcohol Use: 0.0 oz/week    0 Standard drinks or equivalent per week     Comment: 40 oz every 2-3 days  . Drug Use: Yes     Comment: Cocaine 15 years ago, Marijuana 4 mo ago  . Sexual Activity: Not on file   Other Topics Concern  . Not on file   Social History Narrative    Review of Systems: A 12 point ROS discussed and pertinent positives are indicated in the HPI above.  All other systems are negative.  Review of Systems  Vital Signs: BP 138/80 mmHg  Pulse 72  Temp(Src) 98 F (36.7 C)  Resp 14  SpO2 98%  Physical Exam  Constitutional: He is oriented to person, place, and time. He appears well-developed and well-nourished. No distress.  HENT:  Head: Normocephalic and atraumatic.  Eyes: No scleral icterus.  Cardiovascular: Normal rate.   Pulmonary/Chest: Effort normal.  Abdominal: Soft. He exhibits no distension. There is tenderness.  Musculoskeletal:       Arms: Left upper extremity arteriovenous fistula with palpable thrill  Neurological: He is alert and oriented to person, place, and time.  Skin: Skin is  warm and dry.  Psychiatric: He has a normal mood and affect. His behavior is normal.  Nursing note and vitals reviewed.   Imaging: No results found.  Labs:  CBC:  Recent Labs  04/19/15 0244 04/20/15 0255 05/10/15 0724 05/30/15 1544  WBC 7.0 6.9 7.8 6.2  HGB 9.4* 9.8* 10.3* 9.2*  HCT 27.0* 28.2* 28.5* 27.2*  PLT 87* 90* 130* 117*    COAGS:  Recent Labs  06/25/14 2130  06/28/14 0500  07/02/14 0010  02/10/15 1536 04/18/15 0532 05/10/15 0724 05/30/15 1544  INR 2.35*  < > 1.62*  < > 1.43  < > 1.26 1.23 1.28 1.40  APTT 46*  --  32  --  36  --   --   --  35  --   < > =  values in this interval not displayed.  BMP:  Recent Labs  04/19/15 0244 04/20/15 0255 04/21/15 0253 05/10/15 0724 05/30/15 1544  NA 140 138 139 137 132*  K 3.2* 3.4* 3.3* 4.1 4.1  CL 110 106 107 110 104  CO2 18* 21* 22 15* 12*  GLUCOSE 93 122* 94 108* 90  BUN 38* 40* 40* 75* 49*  CALCIUM 8.5* 8.1* 8.2* 9.2 8.4  CREATININE 2.95* 3.19* 3.22* 3.78* 3.02*  GFRNONAA 23* 21* 20* 17*  --   GFRAA 26* 24* 24* 19*  --     LIVER FUNCTION TESTS:  Recent Labs  02/10/15 1536 04/18/15 0532 05/10/15 0724 05/30/15 1544  BILITOT 0.8 0.9 0.8 0.7  AST 47* 43* 26 24  ALT 17 18 15* 10  ALKPHOS 138* 125 97 99  PROT 7.1 7.4 8.4* 7.2  ALBUMIN 2.7* 2.5* 3.0* 3.1*    TUMOR MARKERS:  Recent Labs  07/20/14 1445 11/25/14 1105 02/10/15 1536 05/30/15 1544  AFPTM 3.6 4.6 6.0 4.7    Assessment and Plan:  Doing well. No evidence of TIPS dysfunction on today's follow-up evaluation one year status post TIPS creation. No evidence of recurrent variceal bleeding.  AFP today has trended back down into the normal limits. Unfortunately, continue cross-sectional imaging with MRI or CT scan will be unhelpful in the future given his now advanced chronic kidney disease.  We will have to follow him with screening abdominal ultrasounds from this point forward.  1.) Return to clinic in 1 year with TIPS duplex  ultrasound as well as dedicated liver ultrasound to evaluate for hepatocellular carcinoma.   2.) We'll also obtain labs prior to his next clinic visit including CMP, CBC, INR and AFP   Signed: [place], [place] 06/09/2015, 10:35 AM   I spent a total of 10 Minutes in face to face in clinical consultation, greater than 50% of which was counseling/coordinating care for cirrhosis and portal hypertension with TIPS in situ.

## 2015-06-10 ENCOUNTER — Encounter: Payer: Self-pay | Admitting: Vascular Surgery

## 2015-06-16 ENCOUNTER — Encounter: Payer: Self-pay | Admitting: Vascular Surgery

## 2015-06-16 ENCOUNTER — Ambulatory Visit (INDEPENDENT_AMBULATORY_CARE_PROVIDER_SITE_OTHER): Payer: Self-pay | Admitting: Vascular Surgery

## 2015-06-16 ENCOUNTER — Ambulatory Visit (HOSPITAL_COMMUNITY)
Admission: RE | Admit: 2015-06-16 | Discharge: 2015-06-16 | Disposition: A | Discharge status: Home / Self Care | Payer: Medicaid Other | Source: Ambulatory Visit | Attending: Vascular Surgery | Admitting: Vascular Surgery

## 2015-06-16 VITALS — BP 149/88 | HR 77 | Ht 69.0 in | Wt 216.6 lb

## 2015-06-16 DIAGNOSIS — N186 End stage renal disease: Secondary | ICD-10-CM | POA: Diagnosis not present

## 2015-06-16 DIAGNOSIS — Z4931 Encounter for adequacy testing for hemodialysis: Secondary | ICD-10-CM

## 2015-06-16 DIAGNOSIS — N184 Chronic kidney disease, stage 4 (severe): Secondary | ICD-10-CM

## 2015-06-16 NOTE — Progress Notes (Signed)
Patient is a 54 year old male who is status post placement of a left brachiocephalic AV fistula on May 31. He is currently not on hemodialysis. He states he has some occasional numbness in his hand but more often in his forearm. This is not really bothersome to him. He is scheduled for follow-up with Dr. Lorrene Reid in the near future.  Physical exam:  Filed Vitals:   06/16/15 1525 06/16/15 1527  BP: 159/87 149/88  Pulse: 77   Height: 5\' 9"  (1.753 m)   Weight: 216 lb 9.6 oz (98.249 kg)   SpO2: 99%     Left upper extremity: Well-healed antecubital incision. Palpable thrill in the fistula the fistula is palpable throughout most of the upper arm.  Data: Duplex ultrasound the fistula was performed today. It is 7 mm in diameter throughout most of its course. It is approximate 2 mm deep. There are a few side branches but these do not seem to be preventing development of the fistula.  Assessment: Maturing left arm AV fistula. Patient currently not on hemodialysis.  Plan: Patient will follow-up on as-needed basis.  Ruta Hinds, MD [facility] Office: [phone] Pager: [phone]

## 2015-06-23 ENCOUNTER — Ambulatory Visit: Payer: Medicaid Other | Admitting: Cardiology

## 2015-07-06 ENCOUNTER — Other Ambulatory Visit (HOSPITAL_COMMUNITY): Payer: Self-pay | Admitting: *Deleted

## 2015-07-06 NOTE — Discharge Instructions (Signed)
Darbepoetin Alfa injection What is this medicine? DARBEPOETIN ALFA (dar be POE e tin AL fa) helps your body make more red blood cells. It is used to treat anemia caused by chronic kidney failure and chemotherapy. This medicine may be used for other purposes; ask your health care provider or pharmacist if you have questions. COMMON BRAND NAME(S): Aranesp What should I tell my health care provider before I take this medicine? They need to know if you have any of these conditions: -blood clotting disorders or history of blood clots -cancer patient not on chemotherapy -cystic fibrosis -heart disease, such as angina, heart failure, or a history of a heart attack -hemoglobin level of 12 g/dL or greater -high blood pressure -low levels of folate, iron, or vitamin B12 -seizures -an unusual or allergic reaction to darbepoetin, erythropoietin, albumin, hamster proteins, latex, other medicines, foods, dyes, or preservatives -pregnant or trying to get pregnant -breast-feeding How should I use this medicine? This medicine is for injection into a vein or under the skin. It is usually given by a health care professional in a hospital or clinic setting. If you get this medicine at home, you will be taught how to prepare and give this medicine. Do not shake the solution before you withdraw a dose. Use exactly as directed. Take your medicine at regular intervals. Do not take your medicine more often than directed. It is important that you put your used needles and syringes in a special sharps container. Do not put them in a trash can. If you do not have a sharps container, call your pharmacist or healthcare provider to get one. Talk to your pediatrician regarding the use of this medicine in children. While this medicine may be used in children as young as 1 year for selected conditions, precautions do apply. Overdosage: If you think you have taken too much of this medicine contact a poison control center or  emergency room at once. NOTE: This medicine is only for you. Do not share this medicine with others. What if I miss a dose? If you miss a dose, take it as soon as you can. If it is almost time for your next dose, take only that dose. Do not take double or extra doses. What may interact with this medicine? Do not take this medicine with any of the following medications: -epoetin alfa This list may not describe all possible interactions. Give your health care provider a list of all the medicines, herbs, non-prescription drugs, or dietary supplements you use. Also tell them if you smoke, drink alcohol, or use illegal drugs. Some items may interact with your medicine. What should I watch for while using this medicine? Visit your prescriber or health care professional for regular checks on your progress and for the needed blood tests and blood pressure measurements. It is especially important for the doctor to make sure your hemoglobin level is in the desired range, to limit the risk of potential side effects and to give you the best benefit. Keep all appointments for any recommended tests. Check your blood pressure as directed. Ask your doctor what your blood pressure should be and when you should contact him or her. As your body makes more red blood cells, you may need to take iron, folic acid, or vitamin B supplements. Ask your doctor or health care provider which products are right for you. If you have kidney disease continue dietary restrictions, even though this medication can make you feel better. Talk with your doctor or health   care professional about the foods you eat and the vitamins that you take. What side effects may I notice from receiving this medicine? Side effects that you should report to your doctor or health care professional as soon as possible: -allergic reactions like skin rash, itching or hives, swelling of the face, lips, or tongue -breathing problems -changes in vision -chest  pain -confusion, trouble speaking or understanding -feeling faint or lightheaded, falls -high blood pressure -muscle aches or pains -pain, swelling, warmth in the leg -rapid weight gain -severe headaches -sudden numbness or weakness of the face, arm or leg -trouble walking, dizziness, loss of balance or coordination -seizures (convulsions) -swelling of the ankles, feet, hands -unusually weak or tired Side effects that usually do not require medical attention (report to your doctor or health care professional if they continue or are bothersome): -diarrhea -fever, chills (flu-like symptoms) -headaches -nausea, vomiting -redness, stinging, or swelling at site where injected This list may not describe all possible side effects. Call your doctor for medical advice about side effects. You may report side effects to FDA at 1-800-FDA-1088. Where should I keep my medicine? Keep out of the reach of children. Store in a refrigerator between 2 and 8 degrees C (36 and 46 degrees F). Do not freeze. Do not shake. Throw away any unused portion if using a single-dose vial. Throw away any unused medicine after the expiration date. NOTE: This sheet is a summary. It may not cover all possible information. If you have questions about this medicine, talk to your doctor, pharmacist, or health care provider.  2015, Elsevier/Gold Standard. (2008-11-09 10:23:57)  

## 2015-07-07 ENCOUNTER — Encounter (HOSPITAL_COMMUNITY)
Admission: RE | Admit: 2015-07-07 | Discharge: 2015-07-07 | Disposition: A | Discharge status: Home / Self Care | Payer: Medicaid Other | Source: Ambulatory Visit | Attending: Nephrology | Admitting: Nephrology

## 2015-07-07 DIAGNOSIS — Z5181 Encounter for therapeutic drug level monitoring: Secondary | ICD-10-CM | POA: Insufficient documentation

## 2015-07-07 DIAGNOSIS — Z79899 Other long term (current) drug therapy: Secondary | ICD-10-CM | POA: Diagnosis not present

## 2015-07-07 DIAGNOSIS — N184 Chronic kidney disease, stage 4 (severe): Secondary | ICD-10-CM | POA: Insufficient documentation

## 2015-07-07 DIAGNOSIS — D631 Anemia in chronic kidney disease: Secondary | ICD-10-CM | POA: Insufficient documentation

## 2015-07-07 LAB — POCT HEMOGLOBIN-HEMACUE: Hemoglobin: 10.4 g/dL — ABNORMAL LOW (ref 13.0–17.0)

## 2015-07-07 MED ORDER — DARBEPOETIN ALFA 100 MCG/0.5ML IJ SOSY
100.0000 ug | PREFILLED_SYRINGE | INTRAMUSCULAR | Status: DC
  Administered 2015-07-07: 100 ug via SUBCUTANEOUS

## 2015-08-04 ENCOUNTER — Ambulatory Visit (HOSPITAL_COMMUNITY)
Admission: RE | Admit: 2015-08-04 | Discharge: 2015-08-04 | Disposition: A | Discharge status: Home / Self Care | Payer: Medicaid Other | Source: Ambulatory Visit | Attending: Nephrology | Admitting: Nephrology

## 2015-08-04 DIAGNOSIS — N184 Chronic kidney disease, stage 4 (severe): Secondary | ICD-10-CM | POA: Insufficient documentation

## 2015-08-04 DIAGNOSIS — Z5181 Encounter for therapeutic drug level monitoring: Secondary | ICD-10-CM | POA: Insufficient documentation

## 2015-08-04 DIAGNOSIS — D631 Anemia in chronic kidney disease: Secondary | ICD-10-CM | POA: Insufficient documentation

## 2015-08-04 DIAGNOSIS — Z79899 Other long term (current) drug therapy: Secondary | ICD-10-CM | POA: Insufficient documentation

## 2015-08-04 LAB — IRON AND TIBC
Iron: 39 ug/dL — ABNORMAL LOW (ref 45–182)
SATURATION RATIOS: 10 % — AB (ref 17.9–39.5)
TIBC: 377 ug/dL (ref 250–450)
UIBC: 338 ug/dL

## 2015-08-04 LAB — FERRITIN: FERRITIN: 34 ng/mL (ref 24–336)

## 2015-08-04 LAB — POCT HEMOGLOBIN-HEMACUE: Hemoglobin: 9.2 g/dL — ABNORMAL LOW (ref 13.0–17.0)

## 2015-08-04 MED ORDER — CLONIDINE HCL 0.1 MG PO TABS
0.1000 mg | ORAL_TABLET | Freq: Once | ORAL | Status: DC | PRN
Start: 2015-08-04 — End: 2015-08-05

## 2015-08-04 MED ORDER — DARBEPOETIN ALFA 100 MCG/0.5ML IJ SOSY
100.0000 ug | PREFILLED_SYRINGE | INTRAMUSCULAR | Status: DC
  Administered 2015-08-04: 100 ug via SUBCUTANEOUS

## 2015-08-04 MED ORDER — DARBEPOETIN ALFA 100 MCG/0.5ML IJ SOSY
PREFILLED_SYRINGE | INTRAMUSCULAR | Status: AC
  Filled 2015-08-04: qty 0.5

## 2015-08-16 ENCOUNTER — Other Ambulatory Visit (HOSPITAL_COMMUNITY): Payer: Self-pay | Admitting: *Deleted

## 2015-08-17 ENCOUNTER — Inpatient Hospital Stay (HOSPITAL_COMMUNITY): Admission: RE | Admit: 2015-08-17 | Payer: Medicaid Other | Source: Ambulatory Visit

## 2015-08-24 ENCOUNTER — Ambulatory Visit: Payer: Medicaid Other | Admitting: Cardiology

## 2015-08-31 ENCOUNTER — Other Ambulatory Visit (HOSPITAL_COMMUNITY): Payer: Self-pay | Admitting: *Deleted

## 2015-09-01 ENCOUNTER — Ambulatory Visit (HOSPITAL_COMMUNITY)
Admission: RE | Admit: 2015-09-01 | Discharge: 2015-09-01 | Disposition: A | Discharge status: Home / Self Care | Payer: Medicaid Other | Source: Ambulatory Visit | Attending: Nephrology | Admitting: Nephrology

## 2015-09-01 ENCOUNTER — Encounter: Payer: Self-pay | Admitting: Cardiology

## 2015-09-01 DIAGNOSIS — D631 Anemia in chronic kidney disease: Secondary | ICD-10-CM | POA: Insufficient documentation

## 2015-09-01 DIAGNOSIS — Z79899 Other long term (current) drug therapy: Secondary | ICD-10-CM | POA: Diagnosis not present

## 2015-09-01 DIAGNOSIS — N184 Chronic kidney disease, stage 4 (severe): Secondary | ICD-10-CM | POA: Diagnosis not present

## 2015-09-01 DIAGNOSIS — Z5181 Encounter for therapeutic drug level monitoring: Secondary | ICD-10-CM | POA: Diagnosis not present

## 2015-09-01 DIAGNOSIS — D509 Iron deficiency anemia, unspecified: Secondary | ICD-10-CM | POA: Diagnosis not present

## 2015-09-01 LAB — IRON AND TIBC
Iron: 55 ug/dL (ref 45–182)
Saturation Ratios: 17 % — ABNORMAL LOW (ref 17.9–39.5)
TIBC: 328 ug/dL (ref 250–450)
UIBC: 273 ug/dL

## 2015-09-01 LAB — POCT HEMOGLOBIN-HEMACUE: HEMOGLOBIN: 9.5 g/dL — AB (ref 13.0–17.0)

## 2015-09-01 LAB — FERRITIN: Ferritin: 86 ng/mL (ref 24–336)

## 2015-09-01 MED ORDER — SODIUM CHLORIDE 0.9 % IV SOLN
510.0000 mg | INTRAVENOUS | Status: DC
  Administered 2015-09-01: 510 mg via INTRAVENOUS
  Filled 2015-09-01: qty 17

## 2015-09-01 MED ORDER — DARBEPOETIN ALFA 100 MCG/0.5ML IJ SOSY
PREFILLED_SYRINGE | INTRAMUSCULAR | Status: AC
  Administered 2015-09-01: 100 ug via SUBCUTANEOUS
  Filled 2015-09-01: qty 0.5

## 2015-09-01 MED ORDER — DARBEPOETIN ALFA 100 MCG/0.5ML IJ SOSY
100.0000 ug | PREFILLED_SYRINGE | INTRAMUSCULAR | Status: DC
  Administered 2015-09-01: 100 ug via SUBCUTANEOUS

## 2015-09-13 ENCOUNTER — Inpatient Hospital Stay (HOSPITAL_COMMUNITY): Admission: RE | Admit: 2015-09-13 | Payer: Medicaid Other | Source: Ambulatory Visit

## 2015-09-21 ENCOUNTER — Encounter (HOSPITAL_COMMUNITY)
Admission: RE | Admit: 2015-09-21 | Discharge: 2015-09-21 | Disposition: A | Discharge status: Home / Self Care | Payer: Medicaid Other | Source: Ambulatory Visit | Attending: Nephrology | Admitting: Nephrology

## 2015-09-21 DIAGNOSIS — Z79899 Other long term (current) drug therapy: Secondary | ICD-10-CM | POA: Diagnosis not present

## 2015-09-21 DIAGNOSIS — Z5181 Encounter for therapeutic drug level monitoring: Secondary | ICD-10-CM | POA: Insufficient documentation

## 2015-09-21 DIAGNOSIS — D631 Anemia in chronic kidney disease: Secondary | ICD-10-CM | POA: Insufficient documentation

## 2015-09-21 DIAGNOSIS — N184 Chronic kidney disease, stage 4 (severe): Secondary | ICD-10-CM | POA: Insufficient documentation

## 2015-09-21 MED ORDER — SODIUM CHLORIDE 0.9 % IV SOLN
510.0000 mg | INTRAVENOUS | Status: DC
  Administered 2015-09-21: 510 mg via INTRAVENOUS
  Filled 2015-09-21: qty 17

## 2015-09-29 ENCOUNTER — Encounter (HOSPITAL_COMMUNITY)
Admission: RE | Admit: 2015-09-29 | Discharge: 2015-09-29 | Disposition: A | Discharge status: Home / Self Care | Payer: Medicaid Other | Source: Ambulatory Visit | Attending: Nephrology | Admitting: Nephrology

## 2015-09-29 DIAGNOSIS — N184 Chronic kidney disease, stage 4 (severe): Secondary | ICD-10-CM | POA: Diagnosis not present

## 2015-09-29 LAB — FERRITIN: FERRITIN: 649 ng/mL — AB (ref 24–336)

## 2015-09-29 LAB — IRON AND TIBC
IRON: 57 ug/dL (ref 45–182)
Saturation Ratios: 21 % (ref 17.9–39.5)
TIBC: 272 ug/dL (ref 250–450)
UIBC: 215 ug/dL

## 2015-09-29 LAB — POCT HEMOGLOBIN-HEMACUE: HEMOGLOBIN: 10.6 g/dL — AB (ref 13.0–17.0)

## 2015-09-29 MED ORDER — DARBEPOETIN ALFA 100 MCG/0.5ML IJ SOSY
100.0000 ug | PREFILLED_SYRINGE | INTRAMUSCULAR | Status: DC
  Administered 2015-09-29: 100 ug via SUBCUTANEOUS

## 2015-09-29 MED ORDER — DARBEPOETIN ALFA 100 MCG/0.5ML IJ SOSY
PREFILLED_SYRINGE | INTRAMUSCULAR | Status: AC
  Administered 2015-09-29: 100 ug via SUBCUTANEOUS
  Filled 2015-09-29: qty 0.5

## 2015-10-26 ENCOUNTER — Other Ambulatory Visit (HOSPITAL_COMMUNITY): Payer: Self-pay | Admitting: *Deleted

## 2015-10-27 ENCOUNTER — Encounter (HOSPITAL_COMMUNITY): Payer: Medicaid Other

## 2015-11-03 ENCOUNTER — Emergency Department (HOSPITAL_COMMUNITY): Payer: Medicaid Other

## 2015-11-03 ENCOUNTER — Encounter (HOSPITAL_COMMUNITY): Payer: Self-pay | Admitting: *Deleted

## 2015-11-03 ENCOUNTER — Inpatient Hospital Stay (HOSPITAL_COMMUNITY): Payer: Medicaid Other

## 2015-11-03 ENCOUNTER — Inpatient Hospital Stay (HOSPITAL_COMMUNITY)
Admission: EM | Admit: 2015-11-03 | Discharge: 2015-11-07 | DRG: 682 | Disposition: A | Discharge status: Hospice | Payer: Medicaid Other | Attending: Family Medicine | Admitting: Family Medicine

## 2015-11-03 DIAGNOSIS — D696 Thrombocytopenia, unspecified: Secondary | ICD-10-CM | POA: Diagnosis present

## 2015-11-03 DIAGNOSIS — N19 Unspecified kidney failure: Secondary | ICD-10-CM | POA: Diagnosis present

## 2015-11-03 DIAGNOSIS — K766 Portal hypertension: Secondary | ICD-10-CM | POA: Diagnosis present

## 2015-11-03 DIAGNOSIS — G92 Toxic encephalopathy: Secondary | ICD-10-CM

## 2015-11-03 DIAGNOSIS — I5032 Chronic diastolic (congestive) heart failure: Secondary | ICD-10-CM | POA: Diagnosis present

## 2015-11-03 DIAGNOSIS — F17210 Nicotine dependence, cigarettes, uncomplicated: Secondary | ICD-10-CM | POA: Diagnosis present

## 2015-11-03 DIAGNOSIS — K7290 Hepatic failure, unspecified without coma: Secondary | ICD-10-CM | POA: Diagnosis present

## 2015-11-03 DIAGNOSIS — R0602 Shortness of breath: Secondary | ICD-10-CM

## 2015-11-03 DIAGNOSIS — K7010 Alcoholic hepatitis without ascites: Secondary | ICD-10-CM | POA: Diagnosis present

## 2015-11-03 DIAGNOSIS — E860 Dehydration: Secondary | ICD-10-CM | POA: Diagnosis present

## 2015-11-03 DIAGNOSIS — R531 Weakness: Secondary | ICD-10-CM | POA: Diagnosis present

## 2015-11-03 DIAGNOSIS — K7031 Alcoholic cirrhosis of liver with ascites: Secondary | ICD-10-CM | POA: Diagnosis not present

## 2015-11-03 DIAGNOSIS — D649 Anemia, unspecified: Secondary | ICD-10-CM | POA: Diagnosis present

## 2015-11-03 DIAGNOSIS — Z515 Encounter for palliative care: Secondary | ICD-10-CM | POA: Insufficient documentation

## 2015-11-03 DIAGNOSIS — Z23 Encounter for immunization: Secondary | ICD-10-CM | POA: Diagnosis not present

## 2015-11-03 DIAGNOSIS — K7689 Other specified diseases of liver: Secondary | ICD-10-CM | POA: Diagnosis present

## 2015-11-03 DIAGNOSIS — K7030 Alcoholic cirrhosis of liver without ascites: Secondary | ICD-10-CM | POA: Diagnosis present

## 2015-11-03 DIAGNOSIS — Z66 Do not resuscitate: Secondary | ICD-10-CM | POA: Diagnosis not present

## 2015-11-03 DIAGNOSIS — R160 Hepatomegaly, not elsewhere classified: Secondary | ICD-10-CM | POA: Diagnosis not present

## 2015-11-03 DIAGNOSIS — E872 Acidosis, unspecified: Secondary | ICD-10-CM | POA: Diagnosis present

## 2015-11-03 DIAGNOSIS — I132 Hypertensive heart and chronic kidney disease with heart failure and with stage 5 chronic kidney disease, or end stage renal disease: Secondary | ICD-10-CM | POA: Diagnosis present

## 2015-11-03 DIAGNOSIS — R627 Adult failure to thrive: Secondary | ICD-10-CM | POA: Diagnosis present

## 2015-11-03 DIAGNOSIS — B1920 Unspecified viral hepatitis C without hepatic coma: Secondary | ICD-10-CM | POA: Diagnosis present

## 2015-11-03 DIAGNOSIS — G928 Other toxic encephalopathy: Secondary | ICD-10-CM | POA: Diagnosis present

## 2015-11-03 DIAGNOSIS — F1010 Alcohol abuse, uncomplicated: Secondary | ICD-10-CM | POA: Diagnosis present

## 2015-11-03 DIAGNOSIS — N179 Acute kidney failure, unspecified: Principal | ICD-10-CM | POA: Diagnosis present

## 2015-11-03 DIAGNOSIS — R188 Other ascites: Secondary | ICD-10-CM | POA: Insufficient documentation

## 2015-11-03 DIAGNOSIS — N186 End stage renal disease: Secondary | ICD-10-CM | POA: Diagnosis present

## 2015-11-03 DIAGNOSIS — R079 Chest pain, unspecified: Secondary | ICD-10-CM

## 2015-11-03 DIAGNOSIS — I8510 Secondary esophageal varices without bleeding: Secondary | ICD-10-CM | POA: Diagnosis present

## 2015-11-03 LAB — BLOOD GAS, ARTERIAL
ACID-BASE DEFICIT: 21.6 mmol/L — AB (ref 0.0–2.0)
Bicarbonate: 4.9 mEq/L — ABNORMAL LOW (ref 20.0–24.0)
DRAWN BY: 313061
FIO2: 0.21
O2 SAT: 97.1 %
PCO2 ART: 11.5 mmHg — AB (ref 35.0–45.0)
PO2 ART: 125 mmHg — AB (ref 80.0–100.0)
Patient temperature: 98.6
TCO2: 5.3 mmol/L (ref 0–100)
pH, Arterial: 7.257 — ABNORMAL LOW (ref 7.350–7.450)

## 2015-11-03 LAB — URINALYSIS, ROUTINE W REFLEX MICROSCOPIC
Bilirubin Urine: NEGATIVE
Glucose, UA: NEGATIVE mg/dL
Ketones, ur: NEGATIVE mg/dL
Leukocytes, UA: NEGATIVE
Nitrite: NEGATIVE
Protein, ur: >300 mg/dL — AB
Specific Gravity, Urine: 1.018 (ref 1.005–1.030)
pH: 5 (ref 5.0–8.0)

## 2015-11-03 LAB — URINE MICROSCOPIC-ADD ON

## 2015-11-03 LAB — HEPATIC FUNCTION PANEL
ALT: 18 U/L (ref 17–63)
AST: 31 U/L (ref 15–41)
Albumin: 2.3 g/dL — ABNORMAL LOW (ref 3.5–5.0)
Alkaline Phosphatase: 119 U/L (ref 38–126)
Bilirubin, Direct: 0.3 mg/dL (ref 0.1–0.5)
Indirect Bilirubin: 0.6 mg/dL (ref 0.3–0.9)
Total Bilirubin: 0.9 mg/dL (ref 0.3–1.2)
Total Protein: 9.1 g/dL — ABNORMAL HIGH (ref 6.5–8.1)

## 2015-11-03 LAB — BASIC METABOLIC PANEL WITH GFR
Anion gap: 17 — ABNORMAL HIGH (ref 5–15)
BUN: 160 mg/dL — ABNORMAL HIGH (ref 6–20)
CO2: 6 mmol/L — ABNORMAL LOW (ref 22–32)
Calcium: 9.6 mg/dL (ref 8.9–10.3)
Chloride: 115 mmol/L — ABNORMAL HIGH (ref 101–111)
Creatinine, Ser: 9.24 mg/dL — ABNORMAL HIGH (ref 0.61–1.24)
GFR calc Af Amer: 7 mL/min — ABNORMAL LOW (ref >60)
GFR calc non Af Amer: 6 mL/min — ABNORMAL LOW (ref >60)
Glucose, Bld: 134 mg/dL — ABNORMAL HIGH (ref 65–99)
Potassium: 4.9 mmol/L (ref 3.5–5.1)
Sodium: 138 mmol/L (ref 135–145)

## 2015-11-03 LAB — CBC
HCT: 29.6 % — ABNORMAL LOW (ref 39.0–52.0)
Hemoglobin: 9.8 g/dL — ABNORMAL LOW (ref 13.0–17.0)
MCH: 27.6 pg (ref 26.0–34.0)
MCHC: 33.1 g/dL (ref 30.0–36.0)
MCV: 83.4 fL (ref 78.0–100.0)
Platelets: 139 K/uL — ABNORMAL LOW (ref 150–400)
RBC: 3.55 MIL/uL — ABNORMAL LOW (ref 4.22–5.81)
RDW: 16.8 % — ABNORMAL HIGH (ref 11.5–15.5)
WBC: 12.4 K/uL — ABNORMAL HIGH (ref 4.0–10.5)

## 2015-11-03 LAB — RAPID URINE DRUG SCREEN, HOSP PERFORMED
Amphetamines: NOT DETECTED
BARBITURATES: NOT DETECTED
Benzodiazepines: NOT DETECTED
Cocaine: NOT DETECTED
OPIATES: NOT DETECTED
TETRAHYDROCANNABINOL: NOT DETECTED

## 2015-11-03 LAB — AMMONIA: Ammonia: 105 umol/L — ABNORMAL HIGH (ref 9–35)

## 2015-11-03 LAB — I-STAT CG4 LACTIC ACID, ED: Lactic Acid, Venous: 2.3 mmol/L (ref 0.5–2.0)

## 2015-11-03 LAB — TROPONIN I
Troponin I: 0.07 ng/mL — ABNORMAL HIGH (ref <0.031)
Troponin I: 0.06 ng/mL — ABNORMAL HIGH (ref <0.031)

## 2015-11-03 LAB — ETHANOL: Alcohol, Ethyl (B): <5 mg/dL (ref <5)

## 2015-11-03 MED ORDER — INFLUENZA VAC SPLIT QUAD 0.5 ML IM SUSY
0.5000 mL | PREFILLED_SYRINGE | INTRAMUSCULAR | Status: AC
  Administered 2015-11-04: 0.5 mL via INTRAMUSCULAR
  Filled 2015-11-03: qty 0.5

## 2015-11-03 MED ORDER — LACTULOSE 10 GM/15ML PO SOLN
20.0000 g | Freq: Three times a day (TID) | ORAL | Status: DC
  Administered 2015-11-03 – 2015-11-05 (×3): 20 g via ORAL
  Filled 2015-11-03 (×6): qty 30

## 2015-11-03 MED ORDER — CARVEDILOL 25 MG PO TABS
25.0000 mg | ORAL_TABLET | Freq: Two times a day (BID) | ORAL | Status: DC
  Administered 2015-11-03 – 2015-11-07 (×8): 25 mg via ORAL
  Filled 2015-11-03 (×7): qty 1

## 2015-11-03 MED ORDER — FOLIC ACID 1 MG PO TABS
1.0000 mg | ORAL_TABLET | Freq: Every day | ORAL | Status: DC
  Administered 2015-11-03 – 2015-11-07 (×5): 1 mg via ORAL
  Filled 2015-11-03 (×5): qty 1

## 2015-11-03 MED ORDER — LORAZEPAM 1 MG PO TABS: 1.0000 mg | ORAL_TABLET | Freq: Four times a day (QID) | ORAL | Status: DC | PRN

## 2015-11-03 MED ORDER — PANTOPRAZOLE SODIUM 40 MG IV SOLR
40.0000 mg | Freq: Once | INTRAVENOUS | Status: AC
  Administered 2015-11-03: 40 mg via INTRAVENOUS
  Filled 2015-11-03: qty 40

## 2015-11-03 MED ORDER — THIAMINE HCL 100 MG/ML IJ SOLN
100.0000 mg | Freq: Every day | INTRAMUSCULAR | Status: DC
  Filled 2015-11-03: qty 2

## 2015-11-03 MED ORDER — SODIUM BICARBONATE 4.2 % IV SOLN: 50.0000 meq | Freq: Once | INTRAVENOUS | Status: DC

## 2015-11-03 MED ORDER — SODIUM CHLORIDE 0.9 % IJ SOLN
3.0000 mL | Freq: Two times a day (BID) | INTRAMUSCULAR | Status: DC
  Administered 2015-11-03 – 2015-11-06 (×3): 3 mL via INTRAVENOUS

## 2015-11-03 MED ORDER — LORAZEPAM 2 MG/ML IJ SOLN: 1.0000 mg | Freq: Four times a day (QID) | INTRAMUSCULAR | Status: DC | PRN

## 2015-11-03 MED ORDER — AMLODIPINE BESYLATE 10 MG PO TABS
10.0000 mg | ORAL_TABLET | Freq: Every day | ORAL | Status: DC
Start: 2015-11-03 — End: 2015-11-04
  Administered 2015-11-03: 10 mg via ORAL
  Filled 2015-11-03 (×2): qty 1

## 2015-11-03 MED ORDER — ALBUMIN HUMAN 25 % IV SOLN
25.0000 g | Freq: Four times a day (QID) | INTRAVENOUS | Status: AC
  Administered 2015-11-03 – 2015-11-05 (×6): 25 g via INTRAVENOUS
  Filled 2015-11-03 (×8): qty 100

## 2015-11-03 MED ORDER — ADULT MULTIVITAMIN W/MINERALS CH
1.0000 | ORAL_TABLET | Freq: Every day | ORAL | Status: DC
  Administered 2015-11-03 – 2015-11-07 (×5): 1 via ORAL
  Filled 2015-11-03 (×5): qty 1

## 2015-11-03 MED ORDER — SODIUM BICARBONATE 8.4 % IV SOLN
INTRAVENOUS | Status: AC
  Administered 2015-11-03 – 2015-11-04 (×2): via INTRAVENOUS
  Filled 2015-11-03 (×3): qty 150

## 2015-11-03 MED ORDER — SODIUM CHLORIDE 0.9 % IJ SOLN: 3.0000 mL | INTRAMUSCULAR | Status: DC | PRN

## 2015-11-03 MED ORDER — HEPARIN SODIUM (PORCINE) 5000 UNIT/ML IJ SOLN
5000.0000 [IU] | Freq: Three times a day (TID) | INTRAMUSCULAR | Status: DC
  Administered 2015-11-03 – 2015-11-06 (×7): 5000 [IU] via SUBCUTANEOUS
  Filled 2015-11-03: qty 1

## 2015-11-03 MED ORDER — SODIUM CHLORIDE 0.9 % IV BOLUS (SEPSIS)
1000.0000 mL | Freq: Once | INTRAVENOUS | Status: AC
  Administered 2015-11-03: 1000 mL via INTRAVENOUS

## 2015-11-03 MED ORDER — SODIUM BICARBONATE 8.4 % IV SOLN
50.0000 meq | Freq: Once | INTRAVENOUS | Status: AC
  Administered 2015-11-03: 50 meq via INTRAVENOUS
  Filled 2015-11-03: qty 50

## 2015-11-03 MED ORDER — SODIUM CHLORIDE 0.9 % IV SOLN: 250.0000 mL | INTRAVENOUS | Status: DC | PRN

## 2015-11-03 MED ORDER — SODIUM CHLORIDE 0.9 % IJ SOLN
3.0000 mL | Freq: Two times a day (BID) | INTRAMUSCULAR | Status: DC
  Administered 2015-11-03 – 2015-11-05 (×2): 3 mL via INTRAVENOUS

## 2015-11-03 MED ORDER — SODIUM CHLORIDE 0.9 % IV SOLN: INTRAVENOUS | Status: DC

## 2015-11-03 MED ORDER — VITAMIN B-1 100 MG PO TABS
100.0000 mg | ORAL_TABLET | Freq: Every day | ORAL | Status: DC
  Administered 2015-11-03 – 2015-11-07 (×5): 100 mg via ORAL
  Filled 2015-11-03 (×6): qty 1

## 2015-11-03 MED ORDER — CALCITRIOL 0.25 MCG PO CAPS
0.2500 ug | ORAL_CAPSULE | ORAL | Status: DC
  Administered 2015-11-04 – 2015-11-07 (×4): 0.25 ug via ORAL
  Filled 2015-11-03 (×4): qty 1

## 2015-11-03 MED ORDER — FUROSEMIDE 40 MG PO TABS
40.0000 mg | ORAL_TABLET | Freq: Two times a day (BID) | ORAL | Status: DC
  Administered 2015-11-03 – 2015-11-07 (×7): 40 mg via ORAL
  Filled 2015-11-03 (×8): qty 1

## 2015-11-03 NOTE — ED Notes (Signed)
Pt has dialysis graft to LUE, pt is NOT currently on dialysis per pt and family.

## 2015-11-03 NOTE — ED Provider Notes (Signed)
CSN: [id number]     Arrival date & time 11/03/15  1224 History   First MD Initiated Contact with Patient 11/03/15 1240     Chief Complaint  Patient presents with  . Weakness     (Consider location/radiation/quality/duration/timing/severity/associated sxs/prior Treatment) The history is provided by the patient, a parent and a relative.     Pt with hx cirrhosis, esophageal varices, GI bleed, HTN, CKD, hepatitis s/p TIPS brought in by family for 1 week of worsening SOB, cough, not eating or drinking, feeling dehydrated.  Lives with his mother who states he has been lying in bed a lot and missing doctor's appointments.  They are unsure if he takes his medications.   Denies fevers, chest pain, abdominal pain, leg swelling, hematemesis, urinary complaints.    Level V caveat for acuity of condition vs patient choosing not to or unable to communicate.  Nephrologist Dr Lorrene Reid   Past Medical History  Diagnosis Date  . Hypertension   . Back pain   . Renal disorder     CKD stage 3 noted in 02/2014 [facility] & P  . Anemia 01/2014  . LVH (left ventricular hypertrophy) 02/2014    grade 2 diastolic dysfunction.   . Ankle fracture, left 10/2008    non surgical mgt.   . Thrombocytopenia ([place]) 06/2014  . Coagulopathy ([place]) 06/2014  . Esophageal varices ([place])   . GI bleed   . Cirrhosis ([place])   . Bipolar disorder ([place])   . Hepatitis   . S/P TIPS (transjugular intrahepatic portosystemic shunt)    Past Surgical History  Procedure Laterality Date  . Eye surgery Left 1990's    Foreign body  . Inguinal hernia repair Right 02/08/2014    Procedure: HERNIA REPAIR INGUINAL INCARCERATED;  Surgeon: Edward Jolly, MD;  Location: [place];  Service: General;  Laterality: Right;  . Insertion of mesh Right 02/08/2014    Procedure: INSERTION OF MESH;  Surgeon: Edward Jolly, MD;  Location: [place];  Service: General;  Laterality: Right;  . Hernia repair    . Esophagogastroduodenoscopy N/A 06/24/2014    Procedure:  ESOPHAGOGASTRODUODENOSCOPY (EGD);  Surgeon: Inda Castle, MD;  Location: [place];  Service: Endoscopy;  Laterality: N/A;  . Colonoscopy N/A 06/25/2014    Procedure: COLONOSCOPY;  Surgeon: Inda Castle, MD;  Location: [place];  Service: Endoscopy;  Laterality: N/A;  . Hemorrhoid surgery N/A 06/25/2014    Procedure: oversew of rectal varices;  Surgeon: Harl Bowie, MD;  Location: [place];  Service: General;  Laterality: N/A;  . Radiology with anesthesia N/A 07/02/2014    Procedure: RADIOLOGY WITH ANESTHESIA;  Surgeon: Jacqulynn Cadet, MD;  Location: [place];  Service: Radiology;  Laterality: N/A;  . Av fistula placement Left 05/10/2015    Procedure: Left Upper arm Brachiocephalic ARTERIOVENOUS (AV) FISTULA CREATION;  Surgeon: Elam Dutch, MD;  Location: [facility] OR;  Service: Vascular;  Laterality: Left;   Family History  Problem Relation Age of Onset  . Diabetes Brother     Mother too  . Hypertension      family  . Sickle cell anemia Sister   . Heart attack Neg Hx   . Stroke Neg Hx    Social History  Substance Use Topics  . Smoking status: Current Every Day Smoker -- 0.30 packs/day for 34 years    Types: Cigarettes  . Smokeless tobacco: Never Used     Comment: Smoke since 54 years old  . Alcohol Use: No  Comment: reports last drink 2 weeks ago    Review of Systems  Unable to perform ROS: Acuity of condition      Allergies  Review of patient's allergies indicates no known allergies.  Home Medications   Prior to Admission medications   Medication Sig Start Date End Date Taking? Authorizing Provider  amLODipine (NORVASC) 10 MG tablet Take 1 tablet (10 mg total) by mouth daily. 04/22/15   Velvet Bathe, MD  carvedilol (COREG) 25 MG tablet Take 1 tablet (25 mg total) by mouth 2 (two) times daily with a meal. 04/21/15   Velvet Bathe, MD  furosemide (LASIX) 40 MG tablet Take 1 tablet (40 mg total) by mouth 2 (two) times daily. 04/21/15   Velvet Bathe, MD  oxyCODONE  (ROXICODONE) 5 MG immediate release tablet Take 1 tablet (5 mg total) by mouth every 6 (six) hours as needed. Patient not taking: Reported on 06/16/2015 05/10/15   Aldona Bar J Rhyne, PA-C  potassium chloride (K-DUR) 10 MEQ tablet Take 1 tablet (10 mEq total) by mouth daily. 04/21/15   Velvet Bathe, MD   BP 127/86 mmHg  Pulse 115  Temp(Src) 97.8 F (36.6 C) (Oral)  Resp 22  Ht 5\' 9"  (1.753 m)  Wt 99.791 kg  BMI 32.47 kg/m2  SpO2 100% Physical Exam  Constitutional: He appears well-developed and well-nourished. No distress.  HENT:  Head: Normocephalic and atraumatic.  Mouth/Throat: Mucous membranes are dry.  Dark crusting at the lips  Neck: Normal range of motion. Neck supple.  Cardiovascular: Normal rate and regular rhythm.   Pulmonary/Chest: Effort normal and breath sounds normal. No respiratory distress. He has no wheezes. He has no rales.  Coughing   Abdominal: Soft. He exhibits no distension. There is no tenderness. There is no rebound and no guarding.  Neurological: He is alert. He exhibits normal muscle tone.  Skin: He is not diaphoretic.  Psychiatric:  Keeps eyes closed.  Communicates only occasionally.    Nursing note and vitals reviewed.   ED Course  Procedures (including critical care time) Labs Review Labs Reviewed  BASIC METABOLIC PANEL - Abnormal; Notable for the following:    Chloride 115 (*)    CO2 6 (*)    Glucose, Bld 134 (*)    BUN 160 (*)    Creatinine, Ser 9.24 (*)    GFR calc non Af Amer 6 (*)    GFR calc Af Amer 7 (*)    Anion gap 17 (*)    All other components within normal limits  CBC - Abnormal; Notable for the following:    WBC 12.4 (*)    RBC 3.55 (*)    Hemoglobin 9.8 (*)    HCT 29.6 (*)    RDW 16.8 (*)    Platelets 139 (*)    All other components within normal limits  HEPATIC FUNCTION PANEL - Abnormal; Notable for the following:    Total Protein 9.1 (*)    Albumin 2.3 (*)    All other components within normal limits  I-STAT CG4 LACTIC  ACID, ED - Abnormal; Notable for the following:    Lactic Acid, Venous 2.30 (*)    All other components within normal limits  CULTURE, BLOOD (ROUTINE X 2)  CULTURE, BLOOD (ROUTINE X 2)  ETHANOL  URINALYSIS, ROUTINE W REFLEX MICROSCOPIC (NOT AT [facility])  URINE RAPID DRUG SCREEN, HOSP PERFORMED  [facility]    Imaging Review Dg Chest Portable 1 View  11/03/2015  CLINICAL DATA:  Generalized weakness for 2 weeks  with productive cough EXAM: PORTABLE CHEST - 1 VIEW COMPARISON:  04/18/2015 FINDINGS: The heart size and mediastinal contours are within normal limits. Both lungs are clear. The visualized skeletal structures are unremarkable. IMPRESSION: No active disease. Electronically Signed   By: Inez Catalina M.D.   On: 11/03/2015 13:43   I have personally reviewed and evaluated these images and lab results as part of my medical decision-making.   EKG Interpretation   Date/Time:  Thursday November 03 2015 12:30:16 EST Ventricular Rate:  119 PR Interval:  132 QRS Duration: 98 QT Interval:  346 QTC Calculation: 487 R Axis:   72 Text Interpretation:  Sinus tachycardia Non-specific ST-t changes  Confirmed by RAY MD, Andee Poles [phone]) on 11/03/2015 2:00:36 PM       2:41 PM Pt admitted to [place], unassigned admission.    MDM   Final diagnoses:  Renal failure    Tachycardic, listless patient with hx CKD, cirrhosis brought in by family for decreased activity, decreased PO intake, pt c/o cough, SOB.  In acute renal failure.  BUN 160/creat 9.2.  Baseline creatinine is 3.  Labs also significant for leukocytosis, chronic stable anemia, lactic acidosis.  CXR negative.  Abdominal exam benign. Admitted to [facility] for further evaluation and treatment.     Clayton Bibles, PA-C 11/03/15 1511  Pattricia Boss, MD 11/12/15 [phone]

## 2015-11-03 NOTE — H&P (Signed)
[facility] Admission History and Physical Service Pager: [phone]  Patient name: Edwin Love Medical record number: [id number] Date of birth: October 16, 1961 Age: 54 y.o. Gender: male  Primary Care Provider: Montey Hora, PA-C Consultants: Nephrology Code Status: Full  Chief Complaint: AMS, dyspnea, renal failure  Assessment and Plan: Edwin Love is a 54 y.o. male presenting with dyspnea and AMS and was found to be in renal failure. PMH is significant for CKD IV, cirrhosis with esophageal/rectal varices s/p TIPS with history of alcohol abuse and hepatitis C, HFpEF with grade 2 diastolic dysfunction, HTN, chronic anemia and thrombocytopenia, tobacco abuse and depression.   Renal failure in setting of known CKD, metabolic acidosis: SCr [id number] in ED. Baseline ~ 3. Likely secondary to worsening cirrhosis and portal hypertension. Dehydration also contributing, given 3rd spacing as ascites present and because patient reports reduced PO intake. Granular casts present on UA, consistent with chronic kidney disease. Bicarb 6. -Patient sees Dr. Lorrene Reid through [facility] but has not followed up since May 2016, according to records -AV fistula created 05/10/15 -Limit fluids. Strict I/Os.  -Daily renal panel.  -Nephrology consulted, appreciate recommendations. Anticipate need for dialysis.  -fluids per renal: bicarb drip x 1 day, albumin IVP x 6  Cirrhosis: Abdominal distension at baseline, per family. Patient had TIPS procedure. Ammonia elevated at 105. Patient has confusion and not fully oriented. Lactic acid elevated to 2.30, suggesting poor hepatic clearance.  -Albumin 2.3.  -Total protein 9.1, perhaps 2/2 Hep C.  -Ordered lactulose 20 mg TID given confusion. Titrate with stooling.   AMS secondary to above uremia and hepatic encephalopathy - as above  Chest pain: Centralized, does not radiate. Not reproducible on exam. Could be secondary to cough/shortness of  breath. Does complain of some exertional component. - will cycle troponins - repeat EKG in AM - consider cardiology consult  Dyspnea: CXR negative for infiltrate. suspect some fluid overload with worsened kidney function, missed lasix doses; however no real appreciable crackles at bases. Echo May 2016 EF 60-65%, G1DD. ABG with pH 7.257 / pCO2 11.5 / pO2 0000000 = metabolic acidosis with failed compensation - oxygen as needed (though sat fine in ED) - monitor clinically  HFpEF: Weight stable. No LE edema. No crackles on lung exam. SpO2 100% on RA.  - see above  EtOH abuse: Patient states last drink was about 1 week ago. Ethanol level <5 in the ED. UDS also negative. - CIWA protocol initiated.   Anemia Chronic, at baseline.  -Obtain FOBT.   Thrombocytopenia -Hold kdur, as potassium is 4.9.   FEN/GI: Renal diet, limit fluids Prophylaxis: SubQ heparin  Disposition: Admit to med-surg, attending Dr. Nori Riis. Nephrology consulted. Patient likely in need of dialysis.   History of Present Illness: History is limited by patient confusion. Edwin Love is a 54 y.o. male presenting with shortness of breath and "tightening" in stomach. Patient is a poor historian and is reluctant to answer questions. Patient's family was concerned by his weakness and asked him come to the ED. He states that shortness of breath started 2 weeks ago and has been worse in the past week. He has also been feeling very tired lately. When his SOB gets bad, he lays down to feel better. He says he has had a cough, and he does endorse chest pain. Chest pain is central and present when he is "active". Chest pain has been present over the same time period as the shortness of breath. He has a kidney doctor  that he follows with, but he says he hasn't been in a while. He still makes some urine. He denies any issues with taking his medications. He endorses feeling weak. Denies nausea or vomiting but does say he has had trouble eating  recently. He denies diarrhea or constipation, no blood in stool. He denies recent alcohol use; last drink was a few weeks ago. He continues to smoke currently (~0.5 PPD).    Review Of Systems: Per HPI with the following additions: Decreased appetite.  Otherwise the remainder of the systems were negative.  Patient Active Problem List   Diagnosis Date Noted  . Renal failure 11/03/2015  . Hypertensive heart disease 05/12/2015  . SOB (shortness of breath)   . Flash pulmonary edema ([place]) 04/18/2015  . Acute respiratory failure with hypoxia ([place]) 04/18/2015  . Hypertensive emergency 04/18/2015  . Chronic diastolic heart failure ([place]) 04/18/2015  . S/P TIPS (transjugular intrahepatic portosystemic shunt)   . Hepatitis C 09/27/2014  . Abdominal wall hernia 09/27/2014  . ETOH abuse 08/16/2014  . Physical deconditioning 07/05/2014  . Hemorrhagic shock 06/26/2014  . Acute respiratory failure ([place]) 06/26/2014  . Acute kidney injury ([place]) 06/26/2014  . 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 Acute posthemorrhagic anemia 06/24/2014  . Alcoholic cirrhosis ([place]) [id number]  . Esophageal varices without bleeding ([place]) 06/24/2014  . Lower GI bleed 06/24/2014  . Incarcerated inguinal hernia 02/08/2014  . Chest pain 01/29/2014  . Tachycardia 01/29/2014  . CKD (chronic kidney disease) stage 3, GFR 30-59 ml/min 01/29/2014  . Tobacco abuse 01/29/2014  . Depression 01/29/2014    Past Medical History: Past Medical History  Diagnosis Date  . Hypertension   . Back pain   . Renal disorder     CKD stage 3 noted in 02/2014 H & P  . Anemia 01/2014  . LVH (left ventricular hypertrophy) 02/2014    grade 2 diastolic dysfunction.   . Ankle fracture, left 10/2008    non surgical mgt.   . Thrombocytopenia ([place]) 06/2014  . Coagulopathy ([place]) 06/2014  . Esophageal varices ([place])   . GI bleed   . Cirrhosis ([place])   . Bipolar disorder ([place])   . Hepatitis   . S/P TIPS (transjugular intrahepatic portosystemic shunt)      Past Surgical History: Past Surgical History  Procedure Laterality Date  . Eye surgery Left 1990's    Foreign body  . Inguinal hernia repair Right 02/08/2014    Procedure: HERNIA REPAIR INGUINAL INCARCERATED;  Surgeon: Edward Jolly, MD;  Location: [facility];  Service: General;  Laterality: Right;  . Insertion of mesh Right 02/08/2014    Procedure: INSERTION OF MESH;  Surgeon: Edward Jolly, MD;  Location: [place];  Service: General;  Laterality: Right;  . Hernia repair    . Esophagogastroduodenoscopy N/A 06/24/2014    Procedure: ESOPHAGOGASTRODUODENOSCOPY (EGD);  Surgeon: Inda Castle, MD;  Location: [facility];  Service: Endoscopy;  Laterality: N/A;  . Colonoscopy N/A 06/25/2014    Procedure: COLONOSCOPY;  Surgeon: Inda Castle, MD;  Location: [facility];  Service: Endoscopy;  Laterality: N/A;  . Hemorrhoid surgery N/A 06/25/2014    Procedure: oversew of rectal varices;  Surgeon: Harl Bowie, MD;  Location: [facility];  Service: General;  Laterality: N/A;  . Radiology with anesthesia N/A 07/02/2014    Procedure: RADIOLOGY WITH ANESTHESIA;  Surgeon: Jacqulynn Cadet, MD;  Location: [facility];  Service: Radiology;  Laterality: N/A;  . Av fistula placement Left 05/10/2015    Procedure: Left Upper  arm Brachiocephalic ARTERIOVENOUS (AV) FISTULA CREATION;  Surgeon: Elam Dutch, MD;  Location: [facility] OR;  Service: Vascular;  Laterality: Left;    Social History: Social History  Substance Use Topics  . Smoking status: Current Every Day Smoker -- 0.30 packs/day for 34 years    Types: Cigarettes  . Smokeless tobacco: Never Used     Comment: Smoke since 54 years old  . Alcohol Use: No     Comment: reports last drink 2 weeks ago   Additional social history: Lives at home with his mother.  Please also refer to relevant sections of EMR.  Family History: Family History  Problem Relation Age of Onset  . Diabetes Brother     Mother too  . Hypertension      family  . Sickle cell  anemia Sister   . Heart attack Neg Hx   . Stroke Neg Hx     Allergies and Medications: No Known Allergies No current facility-administered medications on file prior to encounter.   Current Outpatient Prescriptions on File Prior to Encounter  Medication Sig Dispense Refill  . amLODipine (NORVASC) 10 MG tablet Take 1 tablet (10 mg total) by mouth daily. 30 tablet 0  . carvedilol (COREG) 25 MG tablet Take 1 tablet (25 mg total) by mouth 2 (two) times daily with a meal. 60 tablet 0  . furosemide (LASIX) 40 MG tablet Take 1 tablet (40 mg total) by mouth 2 (two) times daily. 30 tablet 0  . potassium chloride (K-DUR) 10 MEQ tablet Take 1 tablet (10 mEq total) by mouth daily. 15 tablet 0    Objective: BP 141/89 mmHg  Pulse 107  Temp(Src) 97.8 F (36.6 C) (Oral)  Resp 23  Ht 5\' 9"  (1.753 m)  Wt 220 lb (99.791 kg)  BMI 32.47 kg/m2  SpO2 100% Exam: General: Thin, disgruntled patient with distended abdomen, sitting in chair  Eyes: Patient missing left eye.  ENTM: MMM tachy. Poor dentition, missing multiple teeth.  Neck: Supple.  Cardiovascular: Tachycardic, S1, S2, no m/r/g.  Respiratory: CTAB, no crackles or increased WOB.  Abdomen: Soft, significantly distended abdomen. Diffuse TTP. +BS Extremities: Thin, AV fistula with bruit of left arm.  Skin: Warm, dry, well-perfused. Abrasion to right upper shin.  Neuro: Stated it was January 2013. Orientated to place. Knew president. Follows commands. Psych: Blunted affect. Minimal eye contact.   Labs and Imaging: CBC BMET   Recent Labs Lab 11/03/15 1326  WBC 12.4*  HGB 9.8*  HCT 29.6*  PLT 139*    Recent Labs Lab 11/03/15 1326  NA 138  K 4.9  CL 115*  CO2 6*  BUN 160*  CREATININE 9.24*  GLUCOSE 134*  CALCIUM 9.6     Dg Chest Portable 1 View  11/03/2015  CLINICAL DATA:  Generalized weakness for 2 weeks with productive cough EXAM: PORTABLE CHEST - 1 VIEW COMPARISON:  04/18/2015 FINDINGS: The heart size and mediastinal  contours are within normal limits. Both lungs are clear. The visualized skeletal structures are unremarkable. IMPRESSION: No active disease. Electronically Signed   By: Inez Catalina M.D.   On: 11/03/2015 13:43    Hillary Corinda Gubler, MD 11/03/2015, 2:52 PM PGY-1, [place] Intern pager: [phone], text pages welcome  I have seen and examined the patient. I have read and agree with the above note. My changes are noted in blue.  Tawanna Sat, MD 11/03/2015, 6:36 PM PGY-3, [place] Intern Pager: [phone], text pages welcome

## 2015-11-03 NOTE — ED Notes (Signed)
X RAY at bedside 

## 2015-11-03 NOTE — Consult Note (Signed)
Edwin Love Admit Date: 11/03/2015 11/03/2015 [name] Requesting Physician:  Nori Riis MD  Reason for Consult:  AoCKD, Acidosis HPI:  52M seen at request of Dr. Nori Riis for above issues. 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 He has continued alcohol use as well.    He came in today with weakness and family had him into ED.  Pt states poor PO for greater than 62mo to fluids and solids.  He says has held his lasix for past several days.  States is thirsty. He denies any recent diarrhea, nausea, vomiting.  He has a protuberant, mildly distended but soft abdomen which is nontender. Trace edema bilaterally. Clear lungs.   CREAT (mg/dL)  Date Value  05/30/2015 3.02*  02/10/2015 2.67*  11/08/2014 1.42*   CREATININE, SER (mg/dL)  Date Value  11/03/2015 9.24*  05/10/2015 3.78*  04/21/2015 3.22*  04/20/2015 3.19*  04/19/2015 2.95*  04/18/2015 3.10*  04/18/2015 2.93*  08/16/2014 1.04  08/15/2014 1.14  07/20/2014 1.4  ] ROS  Balance of 12 systems is negative w/ exceptions as above  PMH  Past Medical History  Diagnosis Date  . Hypertension   . Back pain   . Renal disorder     CKD stage 3 noted in 02/2014 H & P  . Anemia 01/2014  . LVH (left ventricular hypertrophy) 02/2014    grade 2 diastolic dysfunction.   . Ankle fracture, left 10/2008    non surgical mgt.   . Thrombocytopenia ([place]) 06/2014  . Coagulopathy ([place]) 06/2014  . Esophageal varices ([place])   . GI bleed   . Cirrhosis ([place])   . Bipolar disorder ([place])   . Hepatitis   . S/P TIPS (transjugular intrahepatic portosystemic shunt)    PSH  Past Surgical History  Procedure Laterality Date  . Eye surgery Left 1990's    Foreign body  . Inguinal hernia repair Right 02/08/2014    Procedure: HERNIA REPAIR INGUINAL INCARCERATED;  Surgeon:  Edward Jolly, MD;  Location: [facility];  Service: General;  Laterality: Right;  . Insertion of mesh Right 02/08/2014    Procedure: INSERTION OF MESH;  Surgeon: Edward Jolly, MD;  Location: [facility];  Service: General;  Laterality: Right;  . Hernia repair    . Esophagogastroduodenoscopy N/A 06/24/2014    Procedure: ESOPHAGOGASTRODUODENOSCOPY (EGD);  Surgeon: Inda Castle, MD;  Location: [facility];  Service: Endoscopy;  Laterality: N/A;  . Colonoscopy N/A 06/25/2014    Procedure: COLONOSCOPY;  Surgeon: Inda Castle, MD;  Location: [facility];  Service: Endoscopy;  Laterality: N/A;  . Hemorrhoid surgery N/A 06/25/2014    Procedure: oversew of rectal varices;  Surgeon: Harl Bowie, MD;  Location: [facility];  Service: General;  Laterality: N/A;  . Radiology with anesthesia N/A 07/02/2014    Procedure: RADIOLOGY WITH ANESTHESIA;  Surgeon: Jacqulynn Cadet, MD;  Location: [facility];  Service: Radiology;  Laterality: N/A;  . Av fistula placement Left 05/10/2015    Procedure: Left Upper arm Brachiocephalic ARTERIOVENOUS (AV) FISTULA CREATION;  Surgeon: Elam Dutch, MD;  Location: [facility] OR;  Service: Vascular;  Laterality: Left;   FH  Family History  Problem Relation Age of Onset  . Diabetes Brother     Mother too  . Hypertension      family  . Sickle cell anemia  Sister   . Heart attack Neg Hx   . Stroke Neg Hx    SH  reports that he has been smoking Cigarettes.  He has a 10.2 pack-year smoking history. He has never used smokeless tobacco. He reports that he uses illicit drugs. He reports that he does not drink alcohol. Allergies No Known Allergies Home medications Prior to Admission medications   Medication Sig Start Date End Date Taking? Authorizing Provider  amLODipine (NORVASC) 10 MG tablet Take 1 tablet (10 mg total) by mouth daily. 04/22/15  Yes Velvet Bathe, MD  calcitRIOL (ROCALTROL) 0.25 MCG capsule Take 0.25 mcg by mouth every Monday, Wednesday, and Friday. 10/29/15  Yes  Historical Provider, MD  carvedilol (COREG) 25 MG tablet Take 1 tablet (25 mg total) by mouth 2 (two) times daily with a meal. 04/21/15  Yes Velvet Bathe, MD  furosemide (LASIX) 40 MG tablet Take 1 tablet (40 mg total) by mouth 2 (two) times daily. 04/21/15  Yes Velvet Bathe, MD  potassium chloride (K-DUR) 10 MEQ tablet Take 1 tablet (10 mEq total) by mouth daily. 04/21/15  Yes Velvet Bathe, MD    Current Medications Scheduled Meds: . sodium chloride   Intravenous STAT  . amLODipine  10 mg Oral Daily  . [START ON 11/04/2015] calcitRIOL  0.25 mcg Oral Q M,W,F  . carvedilol  25 mg Oral BID WC  . folic acid  1 mg Oral Daily  . furosemide  40 mg Oral BID  . heparin  5,000 Units Subcutaneous 3 times per day  . lactulose  20 g Oral TID  . multivitamin with minerals  1 tablet Oral Daily  . sodium chloride  3 mL Intravenous Q12H  . sodium chloride  3 mL Intravenous Q12H  . thiamine  100 mg Oral Daily   Or  . thiamine  100 mg Intravenous Daily   Continuous Infusions:  PRN Meds:.sodium chloride, LORazepam **OR** LORazepam, sodium chloride  CBC  Recent Labs Lab 11/03/15 1326  WBC 12.4*  HGB 9.8*  HCT 29.6*  MCV 83.4  PLT [id number]*   Basic Metabolic Panel  Recent Labs Lab 11/03/15 1326  NA 138  K 4.9  CL 115*  CO2 6*  GLUCOSE 134*  BUN 160*  CREATININE 9.24*  CALCIUM 9.6    Physical Exam  Blood pressure 135/87, pulse 102, temperature 99 F (37.2 C), temperature source Rectal, resp. rate 21, height 5\' 9"  (1.753 m), weight 99.791 kg (220 lb), SpO2 100 %. GEN: NAD, takes deep breaths ENT: NCAT EYES: muddy sclera CV: RRR, no rub, nl s1s2 PULM: CTAB, no wrr, deep breaths, inc RR ABD: protuberant, mild distension, soft, no tenderness SKIN: no rashes/lesions [id number] LEE AVF LUE +B/T Neuro: faint asterixus b/l   Assessment/Plan 3M ESLD pHTN s/p TIPS admit with FTT, AMS likely uremia + hep encephalopathy, severe metabolic acidosis  1. AoCKD4 with uremia, BL SCr 3s, [place]  CKA likely 2/2 hypovolemia + progression, seronegative and nephrotic 2. 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 Recent ongoing ETOH use 4. AMS, likely uremia + hep encehpalopathy 5. Metabolic Acidosis, mixed NAG and AG likely 2/2 #1 6. LUE AVF +B/T 7. Mild Leukocytosis 8. ? AScites 9. ? HCC 10.   Plan 1. I don't believe pt is candidate for RRT given ESLD, ETOH use 2. Albumin 25gm IVP q6h x 6 doses 3. Change IVFs to NaHCO3 160mEq at 49mLhr x24h 4. 1 amp NaHCO3 now 5. Abd [place] to eval for ascites + renal  obstruction, doubt SBP 6. Monitor for withdrawal 7. Lactulose   Pearson Grippe MD [phone] [phone] 11/03/2015, 5:10 PM

## 2015-11-03 NOTE — ED Notes (Signed)
Pt aware urine is needed.  

## 2015-11-03 NOTE — ED Notes (Signed)
Pt presents via [facility] from home with family for generalized weakness x 2 weeks.  Reports productive cough and abdominal pain, nausea.  Denies emesis or diarrhea.  Pt abdomen distended, per family it looks better than usual.  Denies blood in stool, denies urinary symptoms.  Pt a x 4, tachypnic on arrival.  BP-148/92 P-120 R-24, O2-98% RA.  EKG unremarkable.  Per EMS pt is concerned about ETOH consumption.  NAD.

## 2015-11-03 NOTE — ED Notes (Signed)
Gave pt half a cup of water, per Raquel Sarna - PA

## 2015-11-03 NOTE — ED Notes (Signed)
Arbie Cookey (daughter) would like to be called with updates: [phone]

## 2015-11-04 ENCOUNTER — Inpatient Hospital Stay (HOSPITAL_COMMUNITY): Payer: Medicaid Other

## 2015-11-04 DIAGNOSIS — R160 Hepatomegaly, not elsewhere classified: Secondary | ICD-10-CM | POA: Insufficient documentation

## 2015-11-04 LAB — RENAL FUNCTION PANEL
Albumin: 2.8 g/dL — ABNORMAL LOW (ref 3.5–5.0)
Anion gap: 17 — ABNORMAL HIGH (ref 5–15)
BUN: 155 mg/dL — AB (ref 6–20)
CHLORIDE: 113 mmol/L — AB (ref 101–111)
CO2: 10 mmol/L — ABNORMAL LOW (ref 22–32)
Calcium: 8.9 mg/dL (ref 8.9–10.3)
Creatinine, Ser: 8.57 mg/dL — ABNORMAL HIGH (ref 0.61–1.24)
GFR calc Af Amer: 7 mL/min — ABNORMAL LOW (ref >60)
GFR calc non Af Amer: 6 mL/min — ABNORMAL LOW (ref >60)
GLUCOSE: 143 mg/dL — AB (ref 65–99)
POTASSIUM: 3.9 mmol/L (ref 3.5–5.1)
Phosphorus: 7.9 mg/dL — ABNORMAL HIGH (ref 2.5–4.6)
Sodium: 140 mmol/L (ref 135–145)

## 2015-11-04 LAB — TROPONIN I: TROPONIN I: 0.06 ng/mL — AB (ref <0.031)

## 2015-11-04 LAB — PROTIME-INR
INR: 1.69 — ABNORMAL HIGH (ref 0.00–1.49)
PROTHROMBIN TIME: 19.9 s — AB (ref 11.6–15.2)

## 2015-11-04 MED ORDER — STERILE WATER FOR INJECTION IV SOLN: INTRAVENOUS | Status: DC

## 2015-11-04 MED ORDER — AMLODIPINE BESYLATE 5 MG PO TABS
5.0000 mg | ORAL_TABLET | Freq: Every day | ORAL | Status: DC
  Administered 2015-11-05 – 2015-11-06 (×2): 5 mg via ORAL
  Filled 2015-11-04 (×2): qty 1

## 2015-11-04 MED ORDER — VANCOMYCIN HCL 10 G IV SOLR
1750.0000 mg | INTRAVENOUS | Status: AC
  Administered 2015-11-04: 1750 mg via INTRAVENOUS
  Filled 2015-11-04: qty 1750

## 2015-11-04 MED ORDER — VANCOMYCIN HCL 10 G IV SOLR: 1250.0000 mg | INTRAVENOUS | Status: DC

## 2015-11-04 NOTE — Progress Notes (Signed)
Subjective: Interval History: moaning and doesnot wake up  Objective: Vital signs in last 24 hours: Temp:  [97.6 F (36.4 C)-99 F (37.2 C)] 98.3 F (36.8 C) (11/25 0843) Pulse Rate:  [102-119] 104 (11/25 0843) Resp:  [21-26] 22 (11/25 0843) BP: (122-146)/(69-101) 135/69 mmHg (11/25 0843) SpO2:  [100 %] 100 % (11/25 0843) Weight:  [99.791 kg (220 lb)] 99.791 kg (220 lb) (11/24 1234) Weight change:   Intake/Output from previous day: 11/24 0701 - 11/25 0700 In: 300 [P.O.:300] Out: 750 [Urine:750] Intake/Output this shift: Total I/O In: -  Out: 150 [Urine:150]  General appearance: obtunded, moaning Resp: diminished breath sounds bilaterally and rales bibasilar Cardio: S1, S2 normal GI: obese, mod distension Extremities: extremities normal, atraumatic, no cyanosis or edema and AVF LUA  Lab Results:  Recent Labs  11/03/15 1326  WBC 12.4*  HGB 9.8*  HCT 29.6*  PLT 139*   BMET:  Recent Labs  11/03/15 1326 11/04/15 0420  NA 138 140  K 4.9 3.9  CL 115* 113*  CO2 6* 10*  GLUCOSE 134* 143*  BUN 160* 155*  CREATININE 9.24* 8.57*  CALCIUM 9.6 8.9   No results for input(s): PTH in the last 72 hours. Iron Studies: No results for input(s): IRON, TIBC, TRANSFERRIN, FERRITIN in the last 72 hours.  Studies/Results: US Abdomen Complete  11/03/2015  CLINICAL DATA:  Alcoholic cirrhosis.  Renal failure.  Ascites. EXAM: ULTRASOUND ABDOMEN COMPLETE COMPARISON:  06/09/2015 FINDINGS: Gallbladder: No gallstones identified. Gallbladder is nondilated. Mild diffuse gallbladder wall thickening is seen measuring up to 6 mm. This is nonspecific. No sonographic Murphy sign noted by sonographer. Common bile duct: Diameter: 4 mm, within normal limits. Liver: Diffusely coarsened echotexture, consistent with cirrhosis. A hypoechoic mass is seen in the left hepatic lobe measuring 3.4 cm, which is suspicious for hepatocellular carcinoma in the setting of cirrhosis. There is also a 1 cm  hyperechoic lesion in the right hepatic lobe, with differential diagnosis including regenerative nodule, hepatocellular carcinoma, and hemangioma. IVC: No abnormality visualized. Pancreas: Visualized portion unremarkable. Spleen: Size and appearance within normal limits. Right Kidney: Length: 9.2 cm. Echogenicity within normal limits. No mass or hydronephrosis visualized. Left Kidney: Length: 11.0 cm. Echogenicity within normal limits. No mass or hydronephrosis visualized. Abdominal aorta: No aneurysm visualized. Other findings: No evidence of ascites. IMPRESSION: No evidence of gallstones or biliary ductal dilatation. Mild diffuse gallbladder wall thickening is nonspecific and may be secondary to cirrhosis. Coarse hepatic echotexture, consistent with cirrhosis. 3.4 cm hypoechoic mass in left hepatic lobe, and 1 cm hyperechoic mass in right hepatic lobe. Hepatocellular carcinomas cannot be excluded in setting of cirrhosis. Abdomen MRI without and with contrast recommended for further characterization. No evidence of ascites. Electronically Signed   By: Earle Gell M.D.   On: 11/03/2015 21:15   Dg Chest Portable 1 View  11/03/2015  CLINICAL DATA:  Generalized weakness for 2 weeks with productive cough EXAM: PORTABLE CHEST - 1 VIEW COMPARISON:  04/18/2015 FINDINGS: The heart size and mediastinal contours are within normal limits. Both lungs are clear. The visualized skeletal structures are unremarkable. IMPRESSION: No active disease. Electronically Signed   By: Inez Catalina M.D.   On: 11/03/2015 13:43    I have reviewed the patient's current medications.  Assessment/Plan: 1 CKD 4, AKI thought vol depletion but suspect pos HRS. Will check urine lytes.  Cr a little better but no substantial change in GFR. Still signif acidemia. Will use iv bicarb 2 Cirrhosis ESLD  3 Live mass 4  Ongoing ETOH 5 Varices 6 Anemia will not address at this time 7 HPTH will not address at this time P iv isotonic bicarb, alb,  consider Midodrine   LOS: 1 day   Aryav Wimberly L 11/04/2015,12:11 PM

## 2015-11-04 NOTE — Progress Notes (Signed)
ANTIBIOTIC CONSULT NOTE - INITIAL  Pharmacy Consult for vancomycin Indication: R/o bacteremia  No Known Allergies  Patient Measurements: Height: 5\' 9"  (175.3 cm) Weight: 220 lb (99.791 kg) IBW/kg (Calculated) : 70.7 Adjusted Body Weight:   Vital Signs: Temp: 98.3 F (36.8 C) (11/25 0843) Temp Source: Oral (11/25 0843) BP: 135/69 mmHg (11/25 0843) Pulse Rate: 104 (11/25 0843) Intake/Output from previous day: 11/24 0701 - 11/25 0700 In: 300 [P.O.:300] Out: 750 [Urine:750] Intake/Output from this shift: Total I/O In: -  Out: 150 [Urine:150]  Labs:  Recent Labs  11/03/15 1326 11/04/15 0420  WBC 12.4*  --   HGB 9.8*  --   PLT 139*  --   CREATININE 9.24* 8.57*   Estimated Creatinine Clearance: 11.5 mL/min (by C-G formula based on Cr of 8.57). No results for input(s): VANCOTROUGH, VANCOPEAK, VANCORANDOM, GENTTROUGH, GENTPEAK, GENTRANDOM, TOBRATROUGH, TOBRAPEAK, TOBRARND, AMIKACINPEAK, AMIKACINTROU, AMIKACIN in the last 72 hours.   Microbiology: Recent Results (from the past 720 hour(s))  Culture, blood (routine x 2)     Status: None (Preliminary result)   Collection Time: 11/03/15 12:53 PM  Result Value Ref Range Status   Specimen Description BLOOD RIGHT HAND  Final   Special Requests IN PEDIATRIC BOTTLE 3CC  Final   Culture PENDING  Incomplete   Report Status PENDING  Incomplete  Culture, blood (routine x 2)     Status: None (Preliminary result)   Collection Time: 11/03/15  1:13 PM  Result Value Ref Range Status   Specimen Description BLOOD RIGHT FOREARM  Final   Special Requests BOTTLES DRAWN AEROBIC AND ANAEROBIC 5CC  Final   Culture  Setup Time   Final    GRAM POSITIVE COCCI IN CLUSTERS IN BOTH AEROBIC AND ANAEROBIC BOTTLES CRITICAL RESULT CALLED TO, READ BACK BY AND VERIFIED WITH: A RIO,RN AT 1205 11/04/15 BY L BENFIELD    Culture GRAM POSITIVE COCCI  Final   Report Status PENDING  Incomplete    Medical History: Past Medical History  Diagnosis Date  .  Hypertension   . Back pain   . Renal disorder     CKD stage 3 noted in 02/2014 [facility]  . Anemia 01/2014  . LVH (left ventricular hypertrophy) 02/2014    grade 2 diastolic dysfunction.   . Ankle fracture, left 10/2008    non surgical mgt.   . Thrombocytopenia ([place]) 06/2014  . Coagulopathy ([place]) 06/2014  . Esophageal varices ([place])   . GI bleed   . Cirrhosis ([place])   . Bipolar disorder ([place])   . Hepatitis   . S/P TIPS (transjugular intrahepatic portosystemic shunt)     Medications:  Scheduled:  . albumin human  25 g Intravenous Q6H  . [START ON 11/05/2015] amLODipine  5 mg Oral Daily  . calcitRIOL  0.25 mcg Oral Q M,W,F  . carvedilol  25 mg Oral BID WC  . folic acid  1 mg Oral Daily  . furosemide  40 mg Oral BID  . heparin  5,000 Units Subcutaneous 3 times per day  . lactulose  20 g Oral TID  . multivitamin with minerals  1 tablet Oral Daily  . sodium chloride  3 mL Intravenous Q12H  . sodium chloride  3 mL Intravenous Q12H  . thiamine  100 mg Oral Daily   Or  . thiamine  100 mg Intravenous Daily   Infusions:  .  sodium bicarbonate  infusion 1000 mL 75 mL/hr at 11/03/15 1842   Assessment: 54 yo who presented with  dyspnea and AMS. He was found to be in ARF. He has a hx CKD. Also has the likelihood of hepatocellular carcinoma.  One of his blood cx has come back with GPC so vanc has been ordered. No plan for HD yet.   Goal of Therapy:  Trough = 15-20  Plan:   Vanc 1750g IV x1 then 1.25g IV q72 F/u with level as needed  Onnie Boer, PharmD Pager: [phone] 11/04/2015 2:42 PM

## 2015-11-04 NOTE — Progress Notes (Signed)
Family Medicine Teaching Service Daily Progress Note Intern Pager: [phone]  Patient name: Edwin Love Medical record number: [id number] Date of birth: [birth year] Age: 54 y.o. Gender: male  Primary Care Provider: Montey Hora, PA-C Consultants: Nephrology Code Status: Full  Pt Overview and Major Events to Date:  11/24: US abdomen showing 3.4cm mass  Assessment and Plan: Edwin Love is a 54 y.o. male presenting with dyspnea and AMS and was found to be in renal failure. PMH is significant for CKD IV, cirrhosis with esophageal/rectal varices s/p TIPS with history of alcohol abuse and hepatitis C, HFpEF with grade 2 diastolic dysfunction, HTN, chronic anemia and thrombocytopenia, tobacco abuse and depression.   Renal failure in setting of known CKD, metabolic acidosis: SCr [id number] in ED. Baseline ~ 3. Likely secondary to worsening cirrhosis and portal hypertension. Dehydration also contributing, given 3rd spacing as ascites present and because patient reports reduced PO intake. Granular casts present on UA, consistent with chronic kidney disease. Bicarb 6 improved to 10 on 11/25 -AV fistula created 05/10/15 -Strict I/Os.  -Daily renal panel.  -Nephrology consulted, appreciate recommendations. Anticipate need for dialysis.  -fluids per renal: bicarb drip x 1 day, albumin IVP x 6  Cirrhosis: Abdominal distension at baseline, per family. Patient had TIPS procedure. Ammonia elevated at 105. Patient has confusion and not fully oriented. Lactic acid elevated to 2.30, suggesting poor hepatic clearance. Albumin 2.3. Total protein 9.1. MELD score using INR of 1.4 from 5 months ago (awaiting repeat) = 23, has 3 month mortality 19.6%. -Ordered lactulose 20 mg TID given confusion. Titrate with stooling.  -abdominal ultrasound pending for evaluation of ascites and liver.  Liver mass: 3.4cm mass seen on abdominal ultrasound which may be consistent with [place]. - MRI abdomen w and wo  contrast ordered - consider palliative consult given ESLD, ESRD, and possible cancer diagnosis  Acute toxic metabolic encephalopathy secondary to above uremia and hepatic encephalopathy - as above  Chest pain: Centralized, does not radiate. Not reproducible on exam. Could be secondary to cough/shortness of breath. Does complain of some exertional component. troponins 0.07>0.06>0.06. Denies any current pain. - repeat EKG in AM pending  Dyspnea: CXR negative for infiltrate. suspect some fluid overload with worsened kidney function, missed lasix doses; however no real appreciable crackles at bases. Echo May 2016 EF 60-65%, G1DD. ABG with pH 7.257 / pCO2 11.5 / pO2 0000000 = metabolic acidosis with failed compensation likely contributing (increased respiratory rate) - oxygen as needed (though sat fine in ED) - monitor clinically  HFpEF: Weight stable. No LE edema. No crackles on lung exam. SpO2 100% on RA.  - see above  EtOH abuse: Patient states last drink was about 1 week ago. Ethanol level <5 in the ED. UDS also negative. - CIWA protocol initiated.   Anemia Chronic, at baseline.  - Obtain FOBT.   Thrombocytopenia -Hold kdur, as potassium is 4.9.   FEN/GI: Renal diet, limit fluids Prophylaxis: SubQ heparin  Disposition: pending clinical improvement  Subjective:  Denies pain. Says his breathing is still about the same.   Spoke with daughter, Arbie Cookey [phone], in chart demographics), this morning. She says he lives with her grandparents but they have difficulty taking care of him since they are older. Wife is not involved, and there is one other son but he is incarcerated. She has noted a decline in his function over the past several weeks and confusion, and with his worsened breathing brought him to the ED yesterday. She seems to be realistic about  his prognosis, and would be willing to see palliative medicine team if needed.  Objective: Temp:  [97.6 F (36.4 C)-99 F (37.2 C)]  98.9 F (37.2 C) (11/25 0451) Pulse Rate:  [102-119] 102 (11/25 0451) Resp:  [21-26] 22 (11/25 0451) BP: (122-146)/(77-101) 146/77 mmHg (11/25 0451) SpO2:  [100 %] 100 % (11/25 0451) Weight:  [220 lb (99.791 kg)] 220 lb (99.791 kg) (11/24 1234) Physical Exam: General: NAD, laying on side under blanket, awakens to voice Cardiovascular: tachycardic, no murmur appreciated Respiratory: dependent crackles at bases, rate mildly fast, normal effort Abdomen: soft, protuberant, nontender. Normal bowel sounds Extremities: no LE edema Neuro: oriented to person/place but not time. Follows some commands   Laboratory:  Recent Labs Lab 11/03/15 1326  WBC 12.4*  HGB 9.8*  HCT 29.6*  PLT 139*    Recent Labs Lab 11/03/15 1326 11/04/15 0420  NA 138 140  K 4.9 3.9  CL 115* 113*  CO2 6* 10*  BUN 160* 155*  CREATININE 9.24* 8.57*  CALCIUM 9.6 8.9  PROT 9.1*  --   BILITOT 0.9  --   ALKPHOS 119  --   ALT 18  --   AST 31  --   GLUCOSE 134* 143*    Imaging/Diagnostic Tests: US Abdomen Complete  11/03/2015  CLINICAL DATA:  Alcoholic cirrhosis.  Renal failure.  Ascites. EXAM: ULTRASOUND ABDOMEN COMPLETE COMPARISON:  06/09/2015 FINDINGS: Gallbladder: No gallstones identified. Gallbladder is nondilated. Mild diffuse gallbladder wall thickening is seen measuring up to 6 mm. This is nonspecific. No sonographic Murphy sign noted by sonographer. Common bile duct: Diameter: 4 mm, within normal limits. Liver: Diffusely coarsened echotexture, consistent with cirrhosis. A hypoechoic mass is seen in the left hepatic lobe measuring 3.4 cm, which is suspicious for hepatocellular carcinoma in the setting of cirrhosis. There is also a 1 cm hyperechoic lesion in the right hepatic lobe, with differential diagnosis including regenerative nodule, hepatocellular carcinoma, and hemangioma. IVC: No abnormality visualized. Pancreas: Visualized portion unremarkable. Spleen: Size and appearance within normal limits.  Right Kidney: Length: 9.2 cm. Echogenicity within normal limits. No mass or hydronephrosis visualized. Left Kidney: Length: 11.0 cm. Echogenicity within normal limits. No mass or hydronephrosis visualized. Abdominal aorta: No aneurysm visualized. Other findings: No evidence of ascites. IMPRESSION: No evidence of gallstones or biliary ductal dilatation. Mild diffuse gallbladder wall thickening is nonspecific and may be secondary to cirrhosis. Coarse hepatic echotexture, consistent with cirrhosis. 3.4 cm hypoechoic mass in left hepatic lobe, and 1 cm hyperechoic mass in right hepatic lobe. Hepatocellular carcinomas cannot be excluded in setting of cirrhosis. Abdomen MRI without and with contrast recommended for further characterization. No evidence of ascites. Electronically Signed   By: Earle Gell M.D.   On: 11/03/2015 21:15   Dg Chest Portable 1 View  11/03/2015  CLINICAL DATA:  Generalized weakness for 2 weeks with productive cough EXAM: PORTABLE CHEST - 1 VIEW COMPARISON:  04/18/2015 FINDINGS: The heart size and mediastinal contours are within normal limits. Both lungs are clear. The visualized skeletal structures are unremarkable. IMPRESSION: No active disease. Electronically Signed   By: Inez Catalina M.D.   On: 11/03/2015 13:43     Leone Brand, MD 11/04/2015, 7:11 AM PGY-3, [place] Intern pager: [phone], text pages welcome

## 2015-11-05 LAB — CBC
HEMATOCRIT: 25.2 % — AB (ref 39.0–52.0)
HEMOGLOBIN: 8.4 g/dL — AB (ref 13.0–17.0)
MCH: 27.1 pg (ref 26.0–34.0)
MCHC: 33.3 g/dL (ref 30.0–36.0)
MCV: 81.3 fL (ref 78.0–100.0)
Platelets: 88 10*3/uL — ABNORMAL LOW (ref 150–400)
RBC: 3.1 MIL/uL — AB (ref 4.22–5.81)
RDW: 16.4 % — ABNORMAL HIGH (ref 11.5–15.5)
WBC: 7.7 10*3/uL (ref 4.0–10.5)

## 2015-11-05 LAB — COMPREHENSIVE METABOLIC PANEL
ALBUMIN: 3.3 g/dL — AB (ref 3.5–5.0)
ALK PHOS: 97 U/L (ref 38–126)
ALT: 18 U/L (ref 17–63)
ANION GAP: 15 (ref 5–15)
AST: 38 U/L (ref 15–41)
BILIRUBIN TOTAL: 1.2 mg/dL (ref 0.3–1.2)
BUN: 132 mg/dL — AB (ref 6–20)
CALCIUM: 9 mg/dL (ref 8.9–10.3)
CO2: 15 mmol/L — ABNORMAL LOW (ref 22–32)
Chloride: 106 mmol/L (ref 101–111)
Creatinine, Ser: 7.07 mg/dL — ABNORMAL HIGH (ref 0.61–1.24)
GFR calc Af Amer: 9 mL/min — ABNORMAL LOW (ref >60)
GFR calc non Af Amer: 8 mL/min — ABNORMAL LOW (ref >60)
GLUCOSE: 194 mg/dL — AB (ref 65–99)
Potassium: 3.4 mmol/L — ABNORMAL LOW (ref 3.5–5.1)
SODIUM: 136 mmol/L (ref 135–145)
TOTAL PROTEIN: 7.3 g/dL (ref 6.5–8.1)

## 2015-11-05 LAB — SODIUM, URINE, RANDOM: Sodium, Ur: 77 mmol/L

## 2015-11-05 LAB — CREATININE, URINE, RANDOM: CREATININE, URINE: 49.57 mg/dL

## 2015-11-05 LAB — PHOSPHORUS: Phosphorus: 6.5 mg/dL — ABNORMAL HIGH (ref 2.5–4.6)

## 2015-11-05 MED ORDER — POTASSIUM CHLORIDE CRYS ER 20 MEQ PO TBCR
20.0000 meq | EXTENDED_RELEASE_TABLET | Freq: Once | ORAL | Status: AC
  Administered 2015-11-05: 20 meq via ORAL
  Filled 2015-11-05: qty 1

## 2015-11-05 MED ORDER — STERILE WATER FOR INJECTION IV SOLN
INTRAVENOUS | Status: DC
  Administered 2015-11-05 – 2015-11-06 (×2): via INTRAVENOUS
  Filled 2015-11-05 (×4): qty 850

## 2015-11-05 NOTE — Progress Notes (Signed)
Subjective: Interval History: has no complaint .  Objective: Vital signs in last 24 hours: Temp:  [98 F (36.7 C)-98.9 F (37.2 C)] 98.4 F (36.9 C) (11/26 0812) Pulse Rate:  [97-98] 98 (11/26 0812) Resp:  [22] 22 (11/26 0812) BP: (122-130)/(76-86) 122/76 mmHg (11/26 0812) SpO2:  [98 %-100 %] 100 % (11/26 0812) Weight:  [75 kg (165 lb 5.5 oz)] 75 kg (165 lb 5.5 oz) (11/25 2007) Weight change: -24.791 kg (-54 lb 10.5 oz)  Intake/Output from previous day: 11/25 0701 - 11/26 0700 In: 760 [P.O.:760] Out: 550 [Urine:550] Intake/Output this shift: Total I/O In: 240 [P.O.:240] Out: 200 [Urine:200]  General appearance: no distress, slowed mentation and falls asleep after answering with short answers, tremulous Resp: diminished breath sounds bilaterally and rales bibasilar Cardio: S1, S2 normal and systolic murmur: holosystolic 2/6, blowing at apex GI: obese, pos bs, liver down 4 cm Extremities: extremities normal, atraumatic, no cyanosis or edema  Lab Results:  Recent Labs  11/03/15 1326  WBC 12.4*  HGB 9.8*  HCT 29.6*  PLT 139*   BMET:  Recent Labs  11/03/15 1326 11/04/15 0420  NA 138 140  K 4.9 3.9  CL 115* 113*  CO2 6* 10*  GLUCOSE 134* 143*  BUN 160* 155*  CREATININE 9.24* 8.57*  CALCIUM 9.6 8.9   No results for input(s): PTH in the last 72 hours. Iron Studies: No results for input(s): IRON, TIBC, TRANSFERRIN, FERRITIN in the last 72 hours.  Studies/Results: Mr Abdomen Wo Contrast  11/05/2015  CLINICAL DATA:  Cirrhosis. Portal hypertension. Hypoechoic mass identified on recent ultrasound. MRI recommended for further characterization. Exam was performed without IV contrast. Patient was uncooperative with breathing instructions. Suboptimal exam. EXAM: MRI ABDOMEN WITHOUT CONTRAST TECHNIQUE: Multiplanar multisequence MR imaging was performed without the administration of intravenous contrast. Exam was performed without IV contrast. Patient was uncooperative with  breathing instructions. Limited exam. COMPARISON:  Ultrasound 11/03/2015, MRI 07/29/2014 FINDINGS: Lower chest: Pleural thickening versus atelectasis in the RIGHT lung base (image 4, series 4) Hepatobiliary: There is a new round lesion in the inferior RIGHT hepatic lobe measuring 2.6 cm which is intermediate hyperintense on T2 weighted imaging (image 31, series 4). Liver has a nodular contour with enlargement of the caudate lobe. No ascites. No biliary duct dilatation. Tips shunt noted.  Patency cannot be assessed without contrast. The gallbladder is normal.  The common bile duct normal. Pancreas: Normal pancreatic parenchymal intensity. No ductal dilatation or inflammation. Spleen: Normal spleen. Adrenals/urinary tract: Adrenal glands and kidneys are normal. Stomach/Bowel: Stomach and limited of the small bowel is unremarkable Vascular/Lymphatic: Abdominal aortic normal caliber. No retroperitoneal periportal lymphadenopathy. Musculoskeletal: No aggressive osseous lesion IMPRESSION: 1. New round lesion in the RIGHT hepatic lobe is most consistent with hepatocellular carcinoma given underline cirrhosis. 2. Morphologic changes consists with cirrhosis. Tips shunt catheter noted. No ascites. 3. Pleural thickening versus atelectasis at the RIGHT lung base not completely evaluated. 4. Suboptimal exam due to patient motion lack of IV contrast. Electronically Signed   By: Suzy Bouchard M.D.   On: 11/05/2015 09:52   US Abdomen Complete  11/03/2015  CLINICAL DATA:  Alcoholic cirrhosis.  Renal failure.  Ascites. EXAM: ULTRASOUND ABDOMEN COMPLETE COMPARISON:  06/09/2015 FINDINGS: Gallbladder: No gallstones identified. Gallbladder is nondilated. Mild diffuse gallbladder wall thickening is seen measuring up to 6 mm. This is nonspecific. No sonographic Murphy sign noted by sonographer. Common bile duct: Diameter: 4 mm, within normal limits. Liver: Diffusely coarsened echotexture, consistent with cirrhosis. A hypoechoic mass  is seen in the left hepatic lobe measuring 3.4 cm, which is suspicious for hepatocellular carcinoma in the setting of cirrhosis. There is also a 1 cm hyperechoic lesion in the right hepatic lobe, with differential diagnosis including regenerative nodule, hepatocellular carcinoma, and hemangioma. IVC: No abnormality visualized. Pancreas: Visualized portion unremarkable. Spleen: Size and appearance within normal limits. Right Kidney: Length: 9.2 cm. Echogenicity within normal limits. No mass or hydronephrosis visualized. Left Kidney: Length: 11.0 cm. Echogenicity within normal limits. No mass or hydronephrosis visualized. Abdominal aorta: No aneurysm visualized. Other findings: No evidence of ascites. IMPRESSION: No evidence of gallstones or biliary ductal dilatation. Mild diffuse gallbladder wall thickening is nonspecific and may be secondary to cirrhosis. Coarse hepatic echotexture, consistent with cirrhosis. 3.4 cm hypoechoic mass in left hepatic lobe, and 1 cm hyperechoic mass in right hepatic lobe. Hepatocellular carcinomas cannot be excluded in setting of cirrhosis. Abdomen MRI without and with contrast recommended for further characterization. No evidence of ascites. Electronically Signed   By: Earle Gell M.D.   On: 11/03/2015 21:15   Dg Chest Portable 1 View  11/03/2015  CLINICAL DATA:  Generalized weakness for 2 weeks with productive cough EXAM: PORTABLE CHEST - 1 VIEW COMPARISON:  04/18/2015 FINDINGS: The heart size and mediastinal contours are within normal limits. Both lungs are clear. The visualized skeletal structures are unremarkable. IMPRESSION: No active disease. Electronically Signed   By: Inez Catalina M.D.   On: 11/03/2015 13:43    I have reviewed the patient's current medications.  Assessment/Plan: 1 AKI onCKD 4 from N,V, D in setting of ongoing ETOH abuse.  Do not consider candidate at this time for acute or chronic therapy. With ESLD 2 ESLD 3 Anemia  4 HPTH P Comfort, care.  For  some reason pharmacy stopped ivf with bicarb used for acidemia and hypoperfusion    LOS: 2 days   Witten Certain L 11/05/2015,11:36 AM

## 2015-11-05 NOTE — Progress Notes (Signed)
Family Medicine Teaching Service Daily Progress Note Intern Pager: [phone]  Patient name: Edwin Love Medical record number: [id number] Date of birth: [birth year] Age: 55 y.o. Gender: male  Primary Care Provider: Montey Hora, PA-C Consultants: Nephrology Code Status: Full  Pt Overview and Major Events to Date:  11/24: US abdomen showing 3.4cm mass 11/25: MRI abdomen showed liver lesion c/w HCC  Assessment and Plan: Edwin Love is a 54 y.o. male presenting with dyspnea and AMS and was found to be in renal failure. PMH is significant for CKD IV, cirrhosis with esophageal/rectal varices s/p TIPS with history of alcohol abuse and hepatitis C, HFpEF with grade 2 diastolic dysfunction, HTN, chronic anemia and thrombocytopenia, tobacco abuse and depression.   Renal failure in setting of known CKD, metabolic acidosis: SCr [id number] in ED. Baseline ~ 3. Likely secondary to worsening cirrhosis and portal hypertension. Dehydration also contributing, given 3rd spacing as ascites present and because patient reports reduced PO intake. Granular casts present on UA, consistent with chronic kidney disease. Bicarb 6 improved to 10 on 11/25 - 5:00AM labs STILL NOT DRAWN at 11:00am >> will follow up after they result -AV fistula created 05/10/15 -Strict I/Os.  -Daily renal panel.  -Nephrology consulted, appreciate recommendations. Anticipate need for dialysis.  -fluids per renal: bicarb drip x 1 day, albumin IVP x 6  Cirrhosis: Abdominal distension at baseline, per family. Patient had TIPS procedure. Ammonia elevated at 105. Patient has confusion and not fully oriented. Lactic acid elevated to 2.30, suggesting poor hepatic clearance. Albumin 2.3. Total protein 9.1. MELD score using INR of 1.4 from 5 months ago (awaiting repeat) = 23, has 3 month mortality 19.6%. -Ordered lactulose 20 mg TID given confusion. Titrate with stooling.   - Received NO doses of Lactulose yesterday 11/25 per  nursing -abdominal ultrasound pending for evaluation of ascites and liver.  Liver mass: 3.4cm mass seen on abdominal ultrasound - MRI abdomen w and wo contrast >> concern for [facility] - consider palliative consult given ESLD, ESRD, and possible cancer diagnosis  - Will discuss this further today  Acute toxic metabolic encephalopathy secondary to above uremia and hepatic encephalopathy - as above  Chest pain: Centralized, does not radiate. Not reproducible on exam. Could be secondary to cough/shortness of breath. Does complain of some exertional component. troponins 0.07>0.06>0.06. Denies any current pain. - repeat EKG not concerning - Likely 2/2 poor renal clearance  Dyspnea: CXR negative for infiltrate. suspect some fluid overload with worsened kidney function, missed lasix doses; however no real appreciable crackles at bases. Echo May 2016 EF 60-65%, G1DD. ABG with pH 7.257 / pCO2 11.5 / pO2 0000000 = metabolic acidosis with failed compensation likely contributing (increased respiratory rate) - oxygen as needed (though sat fine in ED) - monitor clinically  HFpEF: Weight stable. No LE edema. No crackles on lung exam. SpO2 100% on RA.  - see above  EtOH abuse: Patient states last drink was about 1 week ago. Ethanol level <5 in the ED. UDS also negative. - CIWA protocol initiated.   Anemia Chronic, at baseline.  - Obtain FOBT.   Thrombocytopenia -Hold kdur, as potassium is 4.9.   FEN/GI: Renal diet, limit fluids Prophylaxis: SubQ heparin  Disposition: pending clinical improvement  Subjective:  Denies pain. Says his breathing is still about the same. Mother at bedside.   Objective: Temp:  [98 F (36.7 C)-98.9 F (37.2 C)] 98.4 F (36.9 C) (11/26 0812) Pulse Rate:  [97-98] 98 (11/26 0812) Resp:  [22] 22 (11/26 [id number])  BP: (122-130)/(76-86) 122/76 mmHg (11/26 0812) SpO2:  [98 %-100 %] 100 % (11/26 0812) Weight:  [165 lb 5.5 oz (75 kg)] 165 lb 5.5 oz (75 kg) (11/25 2007) Physical  Exam: General: NAD, laying in bed, awakens to voice Cardiovascular: tachycardic, no murmur appreciated Respiratory: CTAB, normal effort Abdomen: soft, protuberant, nontender. Normal bowel sounds Extremities: no LE edema Neuro: oriented x3/alert.  Laboratory:  Recent Labs Lab 11/03/15 1326  WBC 12.4*  HGB 9.8*  HCT 29.6*  PLT 139*    Recent Labs Lab 11/03/15 1326 11/04/15 0420  NA 138 140  K 4.9 3.9  CL 115* 113*  CO2 6* 10*  BUN 160* 155*  CREATININE 9.24* 8.57*  CALCIUM 9.6 8.9  PROT 9.1*  --   BILITOT 0.9  --   ALKPHOS 119  --   ALT 18  --   AST 31  --   GLUCOSE 134* 143*    Imaging/Diagnostic Tests: Mr Abdomen Wo Contrast  11/05/2015  CLINICAL DATA:  Cirrhosis. Portal hypertension. Hypoechoic mass identified on recent ultrasound. MRI recommended for further characterization. Exam was performed without IV contrast. Patient was uncooperative with breathing instructions. Suboptimal exam. EXAM: MRI ABDOMEN WITHOUT CONTRAST TECHNIQUE: Multiplanar multisequence MR imaging was performed without the administration of intravenous contrast. Exam was performed without IV contrast. Patient was uncooperative with breathing instructions. Limited exam. COMPARISON:  Ultrasound 11/03/2015, MRI 07/29/2014 FINDINGS: Lower chest: Pleural thickening versus atelectasis in the RIGHT lung base (image 4, series 4) Hepatobiliary: There is a new round lesion in the inferior RIGHT hepatic lobe measuring 2.6 cm which is intermediate hyperintense on T2 weighted imaging (image 31, series 4). Liver has a nodular contour with enlargement of the caudate lobe. No ascites. No biliary duct dilatation. Tips shunt noted.  Patency cannot be assessed without contrast. The gallbladder is normal.  The common bile duct normal. Pancreas: Normal pancreatic parenchymal intensity. No ductal dilatation or inflammation. Spleen: Normal spleen. Adrenals/urinary tract: Adrenal glands and kidneys are normal. Stomach/Bowel:  Stomach and limited of the small bowel is unremarkable Vascular/Lymphatic: Abdominal aortic normal caliber. No retroperitoneal periportal lymphadenopathy. Musculoskeletal: No aggressive osseous lesion IMPRESSION: 1. New round lesion in the RIGHT hepatic lobe is most consistent with hepatocellular carcinoma given underline cirrhosis. 2. Morphologic changes consists with cirrhosis. Tips shunt catheter noted. No ascites. 3. Pleural thickening versus atelectasis at the RIGHT lung base not completely evaluated. 4. Suboptimal exam due to patient motion lack of IV contrast. Electronically Signed   By: Suzy Bouchard M.D.   On: 11/05/2015 09:52   US Abdomen Complete  11/03/2015  CLINICAL DATA:  Alcoholic cirrhosis.  Renal failure.  Ascites. EXAM: ULTRASOUND ABDOMEN COMPLETE COMPARISON:  06/09/2015 FINDINGS: Gallbladder: No gallstones identified. Gallbladder is nondilated. Mild diffuse gallbladder wall thickening is seen measuring up to 6 mm. This is nonspecific. No sonographic Murphy sign noted by sonographer. Common bile duct: Diameter: 4 mm, within normal limits. Liver: Diffusely coarsened echotexture, consistent with cirrhosis. A hypoechoic mass is seen in the left hepatic lobe measuring 3.4 cm, which is suspicious for hepatocellular carcinoma in the setting of cirrhosis. There is also a 1 cm hyperechoic lesion in the right hepatic lobe, with differential diagnosis including regenerative nodule, hepatocellular carcinoma, and hemangioma. IVC: No abnormality visualized. Pancreas: Visualized portion unremarkable. Spleen: Size and appearance within normal limits. Right Kidney: Length: 9.2 cm. Echogenicity within normal limits. No mass or hydronephrosis visualized. Left Kidney: Length: 11.0 cm. Echogenicity within normal limits. No mass or hydronephrosis visualized. Abdominal aorta: No aneurysm visualized.  Other findings: No evidence of ascites. IMPRESSION: No evidence of gallstones or biliary ductal dilatation. Mild  diffuse gallbladder wall thickening is nonspecific and may be secondary to cirrhosis. Coarse hepatic echotexture, consistent with cirrhosis. 3.4 cm hypoechoic mass in left hepatic lobe, and 1 cm hyperechoic mass in right hepatic lobe. Hepatocellular carcinomas cannot be excluded in setting of cirrhosis. Abdomen MRI without and with contrast recommended for further characterization. No evidence of ascites. Electronically Signed   By: Earle Gell M.D.   On: 11/03/2015 21:15   Dg Chest Portable 1 View  11/03/2015  CLINICAL DATA:  Generalized weakness for 2 weeks with productive cough EXAM: PORTABLE CHEST - 1 VIEW COMPARISON:  04/18/2015 FINDINGS: The heart size and mediastinal contours are within normal limits. Both lungs are clear. The visualized skeletal structures are unremarkable. IMPRESSION: No active disease. Electronically Signed   By: Inez Catalina M.D.   On: 11/03/2015 13:43     Elberta Leatherwood, MD 11/05/2015, 11:16 AM PGY-2, [place] Intern pager: [phone], text pages welcome

## 2015-11-06 DIAGNOSIS — R188 Other ascites: Secondary | ICD-10-CM

## 2015-11-06 DIAGNOSIS — Z515 Encounter for palliative care: Secondary | ICD-10-CM

## 2015-11-06 LAB — CULTURE, BLOOD (ROUTINE X 2)

## 2015-11-06 MED ORDER — HYDROMORPHONE HCL 1 MG/ML IJ SOLN
0.2500 mg | INTRAMUSCULAR | Status: DC | PRN
  Administered 2015-11-06: 0.5 mg via INTRAVENOUS
  Filled 2015-11-06: qty 1

## 2015-11-06 MED ORDER — HALOPERIDOL LACTATE 5 MG/ML IJ SOLN: 1.0000 mg | Freq: Four times a day (QID) | INTRAMUSCULAR | Status: DC | PRN

## 2015-11-06 MED ORDER — LACTULOSE 10 GM/15ML PO SOLN
20.0000 g | Freq: Two times a day (BID) | ORAL | Status: DC
Start: 2015-11-06 — End: 2015-11-07
  Filled 2015-11-06: qty 30

## 2015-11-06 MED ORDER — LORAZEPAM 2 MG/ML IJ SOLN: 1.0000 mg | INTRAMUSCULAR | Status: DC | PRN

## 2015-11-06 NOTE — Clinical Social Work Note (Addendum)
Clinical Social Work Assessment  Patient Details  Name: Edwin Love MRN: [id number] Date of Birth: 02-06-1961  Date of referral:  11/06/15               Reason for consult:  End of Life/Hospice                Permission sought to share information with:  Facility Sport and exercise psychologist, Family Supports Permission granted to share information::  Yes, Verbal Permission Granted  Name::     Mother, daughter [place]     Housing/Transportation Living arrangements for the past 2 months:  Single Family Home Source of Information:  Patient Patient Interpreter Needed:  None Criminal Activity/Legal Involvement Pertinent to Current Situation/Hospitalization:  No - Comment as needed Significant Relationships:  Adult Children, Parents Lives with:   Mother Do you feel safe going back to the place where you live?  Yes Need for family participation in patient care:  Yes (Comment)  Care giving concerns:  New referral for residential hospice; not a candidate for dialysis.  Social Worker assessment / plan: CSW spoke with patient about palliative care meeting and recommendation for residential hospice care. Patient indicates an awareness of this and requests [facility] as facility of choice.  He agreed for referral to be initiated.  CSW notified Erling Conte, LCSW Liaison for [facility] who will initiate referral.  CSW attempting to reach patient's mother or daughter for further discussion but was unsuccessful.  Weekday CSW will need to follow up with family. Possible transfer to [facility] if/when bed offered.    Employment status:  Disabled (Comment on whether or not currently receiving Disability) Insurance information:  Medicaid In [place] PT Recommendations:  Not assessed at this time Information / Referral to community resources:  Other (Comment Required) ([place])  Patient/Family's Response to care:  Patient very quiet but indicated that he is comfortable and is  ready to seek residential hospice services.He is agreeable to have his family involved in decision making.    Patient/Family's Understanding of and Emotional Response to Diagnosis, Current Treatment, and Prognosis: Patient noted to be quiet, calm and somewhat stoic when speaking about residential hospice referral.  He stated that he feels this is best option for him and agrees with palliative care recommendation.  CSW unable to reach patient's family for comment.    Emotional Assessment Appearance:  Appears stated age Attitude/Demeanor/Rapport:   (Quiet, calm, responsive, slightly forgetful but aware of this) Affect (typically observed):  Accepting, Calm, Pleasant Orientation:  Oriented to Self, Oriented to Place, Oriented to  Time, Oriented to Situation (has some forgetfulness) Alcohol / Substance use:  Tobacco Use, Alcohol Use (last use of alcohol approx 2 weeks ago) Psych involvement (Current and /or in the community):  No (Comment)  Discharge Needs  Concerns to be addressed:  Care Coordination Readmission within the last 30 days:  No Current discharge risk:  Physical Impairment, Terminally ill Barriers to Discharge:  Continued Medical Work up   [employer] T, LCSW 11/06/2015,4:30 PM

## 2015-11-06 NOTE — Consult Note (Signed)
Consultation Note Date: 11/06/2015   Patient Name: Edwin Love  DOB: May 06, 1961  MRN: [id number]  Age / Sex: 54 y.o., male  PCP: Germain Osgood, PA-C Referring Physician: Dickie La, MD  Reason for Consultation: Establishing goals of care, Inpatient hospice referral, Non pain symptom management, Pain control and Psychosocial/spiritual support    Clinical Assessment/Narrative: Pt is a 54 yo man with ESRD and ESLD./ He is not a candidate for dialysis. He was admitted for AMS. Ammonia 105; creatinine 9.24. New lesions found of [place] and confirmed by MRI and suspicious for [place].   Contacts/Participants in Discussion: Daughter, Francia Greaves Primary Decision Maker: same Relationship to Patient daughter HCPOA: no  Pt is unmarried. He has 2 children, Ms. [place] Crane, and a son who is incarcerated.  SUMMARY OF RECOMMENDATIONS DNR/DNI. Form on shadow chart for trnasport Transfer to in-patient hospice. SW consult placed Will continue PO meds as tolerated.  DC iv antibiotics but if pt can take PO antibiotics and are justified, family agreeable to continuing. Cont IVF as needed until DC No further imaging for invasive procedures Comfort Care  Code Status/Advance Care Planning: DNR    Code Status Orders        Start     Ordered   11/06/15 1051  Do not attempt resuscitation (DNR)   Continuous    Question Answer Comment  In the event of cardiac or respiratory ARREST Do not call a "code blue"   In the event of cardiac or respiratory ARREST Do not perform Intubation, CPR, defibrillation or ACLS   In the event of cardiac or respiratory ARREST Use medication by any route, position, wound care, and other measures to relive pain and suffering. May use oxygen, suction and manual treatment of airway obstruction as needed for comfort.      11/06/15 1050      Other Directives:None  Symptom Management:   Pain: pt c/o abd  pain to RUQ. Order dilaudid .25-.5 q3 prn  Anxiety/agitation: Ativan 1-2 q4 prn as well as haldol 1-2 q6 prn   Palliative Prophylaxis:   Frequent Pain Assessment and Turn Reposition  Additional Recommendations (Limitations, Scope, Preferences):  Full Comfort Care  DC CIWA protocol. Per daughter, pt's last drink does appear to be at least 2 weeks ago. Will order ativan prn though  DC telemetry, continuous pulse ox. Lines and monitoring are irritating pt in his current state  Continue lactulose but will reduce to BID dosing. He is having multiple stools and becomes very agitated when offered lactulose because of risk of soiling himself  Psycho-social/Spiritual:  Support System: [employer] Desire for further Chaplaincy support:no Additional Recommendations: [facility] Referral. Pt has grandchildren in the home  Prognosis: < 4 weeks  Discharge Planning: Hospice facility   Chief Complaint/ Primary Diagnoses: Present on Admission:  . Renal failure . Alcoholic cirrhosis ([place]) . Chest pain . ETOH abuse . Toxic metabolic encephalopathy . Metabolic acidosis . Shortness of breath  I have reviewed the medical record, interviewed the patient and family, and examined the patient. The following aspects are pertinent.  Past Medical History  Diagnosis Date  . Hypertension   . Back pain   . Renal disorder     CKD stage 3 noted in 02/2014 [facility] & P  . Anemia 01/2014  . LVH (left ventricular hypertrophy) 02/2014    grade 2 diastolic dysfunction.   . Ankle fracture, left 10/2008    non surgical mgt.   . Thrombocytopenia ([place]) 06/2014  . Coagulopathy ([place])  06/2014  . Esophageal varices ([place])   . GI bleed   . Cirrhosis ([place])   . Bipolar disorder ([place])   . Hepatitis   . S/P TIPS (transjugular intrahepatic portosystemic shunt)    Social History   Social History  . Marital Status: Single    Spouse Name: N/A  . Number of Children: N/A  . Years of Education: N/A   Social History Main Topics    . Smoking status: Former Smoker -- 0.30 packs/day for 34 years    Types: Cigarettes    Quit date: 10/12/2015  . Smokeless tobacco: Never Used     Comment: Smoke since 54 years old  . Alcohol Use: No     Comment: reports last drink 2 weeks ago  . Drug Use: Yes     Comment: Cocaine 15 years ago, Marijuana 4 mo ago  . Sexual Activity: Not Asked   Other Topics Concern  . None   Social History Narrative   Family History  Problem Relation Age of Onset  . Diabetes Brother     Mother too  . Hypertension      family  . Sickle cell anemia Sister   . Heart attack Neg Hx   . Stroke Neg Hx    Scheduled Meds: . calcitRIOL  0.25 mcg Oral Q M,W,F  . carvedilol  25 mg Oral BID WC  . folic acid  1 mg Oral Daily  . furosemide  40 mg Oral BID  . heparin  5,000 Units Subcutaneous 3 times per day  . lactulose  20 g Oral BID  . multivitamin with minerals  1 tablet Oral Daily  . sodium chloride  3 mL Intravenous Q12H  . sodium chloride  3 mL Intravenous Q12H  . thiamine  100 mg Oral Daily   Or  . thiamine  100 mg Intravenous Daily   Continuous Infusions: .  sodium bicarbonate 150 mEq in sterile water 1000 mL infusion 100 mL/hr at 11/06/15 0649   PRN Meds:.sodium chloride, haloperidol lactate, HYDROmorphone (DILAUDID) injection, LORazepam, sodium chloride Medications Prior to Admission:  Prior to Admission medications   Medication Sig Start Date End Date Taking? Authorizing Provider  amLODipine (NORVASC) 10 MG tablet Take 1 tablet (10 mg total) by mouth daily. 04/22/15  Yes Velvet Bathe, MD  calcitRIOL (ROCALTROL) 0.25 MCG capsule Take 0.25 mcg by mouth every Monday, Wednesday, and Friday. 10/29/15  Yes Historical Provider, MD  carvedilol (COREG) 25 MG tablet Take 1 tablet (25 mg total) by mouth 2 (two) times daily with a meal. 04/21/15  Yes Velvet Bathe, MD  furosemide (LASIX) 40 MG tablet Take 1 tablet (40 mg total) by mouth 2 (two) times daily. 04/21/15  Yes Velvet Bathe, MD  potassium  chloride (K-DUR) 10 MEQ tablet Take 1 tablet (10 mEq total) by mouth daily. 04/21/15  Yes Velvet Bathe, MD   No Known Allergies  Review of Systems  Unable to perform ROS: Mental status change    Physical Exam  Constitutional: He appears well-nourished.  HENT:  Head: Normocephalic.  Cardiovascular: Normal rate and regular rhythm.   Respiratory: Effort normal and breath sounds normal.  GI: He exhibits distension. There is tenderness.  Musculoskeletal: Normal range of motion.  Neurological:  Somnolent, confused  Skin: Skin is warm and dry.  Psychiatric:  Confused, irritable    Vital Signs: BP 121/65 mmHg  Pulse 71  Temp(Src) 97.4 F (36.3 C) (Oral)  Resp 20  Ht 5\' 9"  (1.753 m)  Wt 73  kg (160 lb 15 oz)  BMI 23.76 kg/m2  SpO2 100%  SpO2: SpO2: 100 % O2 Device:SpO2: 100 % O2 Flow Rate: .   IO: Intake/output summary:  Intake/Output Summary (Last 24 hours) at 11/06/15 1125 Last data filed at 11/06/15 [id number]  Gross per 24 hour  Intake    360 ml  Output    878 ml  Net   -518 ml    LBM: Last BM Date: 11/05/15 Baseline Weight: Weight: 99.791 kg (220 lb) Most recent weight: Weight: 73 kg (160 lb 15 oz)      Palliative Assessment/Data:  Flowsheet Rows        Most Recent Value   Intake Tab    Referral Department  Hospitalist   Unit at Time of Referral  Cardiac/Telemetry Unit   Palliative Care Primary Diagnosis  Nephrology   Date Notified  11/05/15   Palliative Care Type  New Palliative care   Reason for referral  Clarify Goals of Care, End of Life Care Assistance, Counsel Regarding Hospice   Date of Admission  11/03/15   Date first seen by Palliative Care  11/06/15   # of days Palliative referral response time  1 Day(s)   # of days IP prior to Palliative referral  2   Clinical Assessment    Palliative Performance Scale Score  40%   Pain Max last 24 hours  Other (Comment)   Pain Min Last 24 hours  Other (Comment)   Dyspnea Max Last 24 Hours  Other (Comment)   Dyspnea  Min Last 24 hours  Other (Comment)   Nausea Max Last 24 Hours  Other (Comment)   Nausea Min Last 24 Hours  Other (Comment)   Anxiety Max Last 24 Hours  Other (Comment)   Anxiety Min Last 24 Hours  Other (Comment)   Other Max Last 24 Hours  Other (Comment)   Psychosocial & Spiritual Assessment    Palliative Care Outcomes    Patient/Family meeting held?  Yes   Who was at the meeting?  daughter   Palliative Care Outcomes  Improved pain interventions, Improved non-pain symptom therapy, Clarified goals of care, Counseled regarding hospice, Changed to focus on comfort, Transitioned to hospice, Completed durable DNR, Changed CPR status   Patient/Family wishes: Interventions discontinued/not started   Mechanical Ventilation, BiPAP, Trach, PEG, Hemodialysis, Transfusion, Vasopressors, Antibiotics      Additional Data Reviewed:  CBC:    Component Value Date/Time   WBC 7.7 11/05/2015 1300   HGB 8.4* 11/05/2015 1300   HCT 25.2* 11/05/2015 1300   PLT 88* 11/05/2015 1300   MCV 81.3 11/05/2015 1300   NEUTROABS 6.1 04/18/2015 0532   LYMPHSABS 1.1 04/18/2015 0532   MONOABS 0.6 04/18/2015 0532   EOSABS 0.3 04/18/2015 0532   BASOSABS 0.1 04/18/2015 0532   Comprehensive Metabolic Panel:    Component Value Date/Time   NA 136 11/05/2015 1300   K 3.4* 11/05/2015 1300   CL 106 11/05/2015 1300   CO2 15* 11/05/2015 1300   BUN 132* 11/05/2015 1300   CREATININE 7.07* 11/05/2015 1300   CREATININE 3.02* 05/30/2015 1544   GLUCOSE 194* 11/05/2015 1300   CALCIUM 9.0 11/05/2015 1300   AST 38 11/05/2015 1300   ALT 18 11/05/2015 1300   ALKPHOS 97 11/05/2015 1300   BILITOT 1.2 11/05/2015 1300   PROT 7.3 11/05/2015 1300   ALBUMIN 3.3* 11/05/2015 1300     Time In: 1000 Time Out: 1130 Time Total: 90 min Greater than 50%  of this  time was spent counseling and coordinating care related to the above assessment and plan. Staffed with attending shift to comfort care  Signed by: Dory Horn,  NP  Dory Horn, NP  11/06/2015, 11:25 AM  Please contact Palliative Medicine Team phone at [phone] for questions and concerns.

## 2015-11-06 NOTE — Progress Notes (Signed)
Subjective: Interval History: has no complaint, sleepy with pain meds.  Objective: Vital signs in last 24 hours: Temp:  [97.4 F (36.3 C)-99.4 F (37.4 C)] 97.4 F (36.3 C) (11/27 0840) Pulse Rate:  [71-97] 71 (11/27 0840) Resp:  [20-22] 20 (11/27 0840) BP: (103-131)/(52-80) 121/65 mmHg (11/27 0840) SpO2:  [100 %] 100 % (11/27 0840) Weight:  [73 kg (160 lb 15 oz)] 73 kg (160 lb 15 oz) (11/26 1958) Weight change: -2 kg (-4 lb 6.6 oz)  Intake/Output from previous day: 11/26 0701 - 11/27 0700 In: 600 [P.O.:600] Out: 928 [Urine:925; Stool:3] Intake/Output this shift: Total I/O In: -  Out: 150 [Urine:150]  General appearance: no distress and slowed mentation Resp: diminished breath sounds bilaterally Cardio: S1, S2 normal and systolic murmur: holosystolic 2/6, blowing at apex GI: pos bs, obese, liver down 5 cm Extremities: extremities normal, atraumatic, no cyanosis or edema  Lab Results:  Recent Labs  11/03/15 1326 11/05/15 1300  WBC 12.4* 7.7  HGB 9.8* 8.4*  HCT 29.6* 25.2*  PLT 139* 88*   BMET:  Recent Labs  11/04/15 0420 11/05/15 1300  NA 140 136  K 3.9 3.4*  CL 113* 106  CO2 10* 15*  GLUCOSE 143* 194*  BUN 155* 132*  CREATININE 8.57* 7.07*  CALCIUM 8.9 9.0   No results for input(s): PTH in the last 72 hours. Iron Studies: No results for input(s): IRON, TIBC, TRANSFERRIN, FERRITIN in the last 72 hours.  Studies/Results: Mr Abdomen Wo Contrast  11/05/2015  CLINICAL DATA:  Cirrhosis. Portal hypertension. Hypoechoic mass identified on recent ultrasound. MRI recommended for further characterization. Exam was performed without IV contrast. Patient was uncooperative with breathing instructions. Suboptimal exam. EXAM: MRI ABDOMEN WITHOUT CONTRAST TECHNIQUE: Multiplanar multisequence MR imaging was performed without the administration of intravenous contrast. Exam was performed without IV contrast. Patient was uncooperative with breathing instructions. Limited  exam. COMPARISON:  Ultrasound 11/03/2015, MRI 07/29/2014 FINDINGS: Lower chest: Pleural thickening versus atelectasis in the RIGHT lung base (image 4, series 4) Hepatobiliary: There is a new round lesion in the inferior RIGHT hepatic lobe measuring 2.6 cm which is intermediate hyperintense on T2 weighted imaging (image 31, series 4). Liver has a nodular contour with enlargement of the caudate lobe. No ascites. No biliary duct dilatation. Tips shunt noted.  Patency cannot be assessed without contrast. The gallbladder is normal.  The common bile duct normal. Pancreas: Normal pancreatic parenchymal intensity. No ductal dilatation or inflammation. Spleen: Normal spleen. Adrenals/urinary tract: Adrenal glands and kidneys are normal. Stomach/Bowel: Stomach and limited of the small bowel is unremarkable Vascular/Lymphatic: Abdominal aortic normal caliber. No retroperitoneal periportal lymphadenopathy. Musculoskeletal: No aggressive osseous lesion IMPRESSION: 1. New round lesion in the RIGHT hepatic lobe is most consistent with hepatocellular carcinoma given underline cirrhosis. 2. Morphologic changes consists with cirrhosis. Tips shunt catheter noted. No ascites. 3. Pleural thickening versus atelectasis at the RIGHT lung base not completely evaluated. 4. Suboptimal exam due to patient motion lack of IV contrast. Electronically Signed   By: Suzy Bouchard M.D.   On: 11/05/2015 09:52    I have reviewed the patient's current medications.  Assessment/Plan: 1 AKI Cr improved and bicarb better with iv bicarb and ivf.  ? Dilution 2 CKD4 3 ESLD 4 Anemia  5 HPTH 6 Prob Hepatoma P conservative measures    LOS: 3 days   Gardiner Espana L 11/06/2015,11:30 AM

## 2015-11-06 NOTE — Progress Notes (Signed)
Family Medicine Teaching Service Daily Progress Note Intern Pager: [phone]  Patient name: Edwin Love Medical record number: [id number] Date of birth: [birth year] Age: 54 y.o. Gender: male  Primary Care Provider: Montey Hora, PA-C Consultants: Nephrology Code Status: Full  Pt Overview and Major Events to Date:  11/24: US abdomen showing 3.4cm mass 11/25: MRI abdomen showed liver lesion c/w [facility]  Assessment and Plan: 54 y.o. male presenting with dyspnea and AMS and was found to be in renal failure. PMH is significant for CKD IV, cirrhosis with esophageal/rectal varices s/p TIPS with history of alcohol abuse and hepatitis C, HFpEF with grade 2 diastolic dysfunction, HTN, chronic anemia and thrombocytopenia, tobacco abuse and depression.   # Renal failure in setting of known CKD, metabolic acidosis: SCr Q000111Q in ED. Baseline ~ 3. Likely secondary to worsening cirrhosis and portal hypertension. Dehydration also contributing, given 3rd spacing as ascites present and because patient reports reduced PO intake. Granular casts present on UA, consistent with chronic kidney disease. Bicarb 6 improved to 10 on 11/25 -Strict I/Os.  -Nephrology consulted, appreciate recommendations. Recommend comfort care.  # Cirrhosis: Abdominal distension at baseline, per family. Patient had TIPS procedure. Ammonia elevated at 105. Lactic acid elevated to 2.30, suggesting poor hepatic clearance. Albumin 2.3. Total protein 9.1.Marland Kitchen - Lactulose 20 mg TID given confusion. Titrate with stooling.  - Continue to monitor mental status  # Liver mass: 3.4cm mass seen on abdominal ultrasound - MRI abdomen w and wo contrast >> concern for [facility] - Palliative care consulted, appreciate recommendations. Change status to DNR  # Dyspnea: CXR negative for infiltrate. suspect some fluid overload with worsened kidney function, missed lasix doses; however no real appreciable crackles at bases. Echo May 2016 EF 60-65%, G1DD. ABG  with pH 7.257 / pCO2 11.5 / pO2 0000000 = metabolic acidosis with failed compensation likely contributing (increased respiratory rate) - Oxygen via [place] PRN - Continue to monitor  # EtOH abuse: Patient states last drink was about 1 week ago. Ethanol level <5 in the ED. UDS also negative. - CIWA protocol (0>2>0)  FEN/GI: Renal diet, limit fluids Prophylaxis: SubQ heparin  Disposition: pending clinical improvement and palliative recommendations  Subjective:  Feeling well today. Notes mild generalized abdominal pain. Feels tired. No further complaints.  Objective: Temp:  [97.4 F (36.3 C)-99.4 F (37.4 C)] 97.4 F (36.3 C) (11/27 0840) Pulse Rate:  [71-97] 71 (11/27 0840) Resp:  [20-22] 20 (11/27 0840) BP: (103-131)/(52-80) 121/65 mmHg (11/27 0840) SpO2:  [100 %] 100 % (11/27 0840) Weight:  [160 lb 15 oz (73 kg)] 160 lb 15 oz (73 kg) (11/26 1958) Physical Exam: General: 54yo male resting comfortably in no apparent distress, sitting in bed Cardiovascular: regular rate and rhythm, no murmurs noted Respiratory: CTAB, normal effort Abdomen: soft, generalized tenderness. Ascites. Normal bowel sounds Extremities: no LE edema Neuro: oriented x3/alert.  Laboratory:  Recent Labs Lab 11/03/15 1326 11/05/15 1300  WBC 12.4* 7.7  HGB 9.8* 8.4*  HCT 29.6* 25.2*  PLT 139* 88*    Recent Labs Lab 11/03/15 1326 11/04/15 0420 11/05/15 1300  NA 138 140 136  K 4.9 3.9 3.4*  CL 115* 113* 106  CO2 6* 10* 15*  BUN 160* 155* 132*  CREATININE 9.24* 8.57* 7.07*  CALCIUM 9.6 8.9 9.0  PROT 9.1*  --  7.3  BILITOT 0.9  --  1.2  ALKPHOS 119  --  97  ALT 18  --  18  AST 31  --  38  GLUCOSE 134*  143* 194*    Imaging/Diagnostic Tests: Mr Abdomen Wo Contrast  11/05/2015  CLINICAL DATA:  Cirrhosis. Portal hypertension. Hypoechoic mass identified on recent ultrasound. MRI recommended for further characterization. Exam was performed without IV contrast. Patient was uncooperative with breathing  instructions. Suboptimal exam. EXAM: MRI ABDOMEN WITHOUT CONTRAST TECHNIQUE: Multiplanar multisequence MR imaging was performed without the administration of intravenous contrast. Exam was performed without IV contrast. Patient was uncooperative with breathing instructions. Limited exam. COMPARISON:  Ultrasound 11/03/2015, MRI 07/29/2014 FINDINGS: Lower chest: Pleural thickening versus atelectasis in the RIGHT lung base (image 4, series 4) Hepatobiliary: There is a new round lesion in the inferior RIGHT hepatic lobe measuring 2.6 cm which is intermediate hyperintense on T2 weighted imaging (image 31, series 4). Liver has a nodular contour with enlargement of the caudate lobe. No ascites. No biliary duct dilatation. Tips shunt noted.  Patency cannot be assessed without contrast. The gallbladder is normal.  The common bile duct normal. Pancreas: Normal pancreatic parenchymal intensity. No ductal dilatation or inflammation. Spleen: Normal spleen. Adrenals/urinary tract: Adrenal glands and kidneys are normal. Stomach/Bowel: Stomach and limited of the small bowel is unremarkable Vascular/Lymphatic: Abdominal aortic normal caliber. No retroperitoneal periportal lymphadenopathy. Musculoskeletal: No aggressive osseous lesion IMPRESSION: 1. New round lesion in the RIGHT hepatic lobe is most consistent with hepatocellular carcinoma given underline cirrhosis. 2. Morphologic changes consists with cirrhosis. Tips shunt catheter noted. No ascites. 3. Pleural thickening versus atelectasis at the RIGHT lung base not completely evaluated. 4. Suboptimal exam due to patient motion lack of IV contrast. Electronically Signed   By: Suzy Bouchard M.D.   On: 11/05/2015 09:52   US Abdomen Complete  11/03/2015  CLINICAL DATA:  Alcoholic cirrhosis.  Renal failure.  Ascites. EXAM: ULTRASOUND ABDOMEN COMPLETE COMPARISON:  06/09/2015 FINDINGS: Gallbladder: No gallstones identified. Gallbladder is nondilated. Mild diffuse gallbladder wall  thickening is seen measuring up to 6 mm. This is nonspecific. No sonographic Murphy sign noted by sonographer. Common bile duct: Diameter: 4 mm, within normal limits. Liver: Diffusely coarsened echotexture, consistent with cirrhosis. A hypoechoic mass is seen in the left hepatic lobe measuring 3.4 cm, which is suspicious for hepatocellular carcinoma in the setting of cirrhosis. There is also a 1 cm hyperechoic lesion in the right hepatic lobe, with differential diagnosis including regenerative nodule, hepatocellular carcinoma, and hemangioma. IVC: No abnormality visualized. Pancreas: Visualized portion unremarkable. Spleen: Size and appearance within normal limits. Right Kidney: Length: 9.2 cm. Echogenicity within normal limits. No mass or hydronephrosis visualized. Left Kidney: Length: 11.0 cm. Echogenicity within normal limits. No mass or hydronephrosis visualized. Abdominal aorta: No aneurysm visualized. Other findings: No evidence of ascites. IMPRESSION: No evidence of gallstones or biliary ductal dilatation. Mild diffuse gallbladder wall thickening is nonspecific and may be secondary to cirrhosis. Coarse hepatic echotexture, consistent with cirrhosis. 3.4 cm hypoechoic mass in left hepatic lobe, and 1 cm hyperechoic mass in right hepatic lobe. Hepatocellular carcinomas cannot be excluded in setting of cirrhosis. Abdomen MRI without and with contrast recommended for further characterization. No evidence of ascites. Electronically Signed   By: Earle Gell M.D.   On: 11/03/2015 21:15   Dg Chest Portable 1 View  11/03/2015  CLINICAL DATA:  Generalized weakness for 2 weeks with productive cough EXAM: PORTABLE CHEST - 1 VIEW COMPARISON:  04/18/2015 FINDINGS: The heart size and mediastinal contours are within normal limits. Both lungs are clear. The visualized skeletal structures are unremarkable. IMPRESSION: No active disease. Electronically Signed   By: Linus Mako.D.  On: 11/03/2015 13:43    Lorna Few, DO 11/06/2015, 9:12 AM PGY-2, [place] Intern pager: [phone], text pages welcome

## 2015-11-07 LAB — CBC
HEMATOCRIT: 25.3 % — AB (ref 39.0–52.0)
HEMOGLOBIN: 8.7 g/dL — AB (ref 13.0–17.0)
MCH: 27.9 pg (ref 26.0–34.0)
MCHC: 34.4 g/dL (ref 30.0–36.0)
MCV: 81.1 fL (ref 78.0–100.0)
Platelets: 81 10*3/uL — ABNORMAL LOW (ref 150–400)
RBC: 3.12 MIL/uL — ABNORMAL LOW (ref 4.22–5.81)
RDW: 16.2 % — AB (ref 11.5–15.5)
WBC: 7.6 10*3/uL (ref 4.0–10.5)

## 2015-11-07 MED ORDER — LACTULOSE 10 GM/15ML PO SOLN: 20.0000 g | Freq: Two times a day (BID) | ORAL | Status: AC

## 2015-11-07 MED ORDER — HALOPERIDOL LACTATE 5 MG/ML IJ SOLN: 1.0000 mg | Freq: Four times a day (QID) | INTRAMUSCULAR | Status: AC | PRN

## 2015-11-07 MED ORDER — HYDROMORPHONE HCL 1 MG/ML IJ SOLN: 0.2500 mg | INTRAMUSCULAR | Status: AC | PRN

## 2015-11-07 MED ORDER — LORAZEPAM 2 MG/ML IJ SOLN: 1.0000 mg | INTRAMUSCULAR | Status: AC | PRN

## 2015-11-07 NOTE — Progress Notes (Signed)
Report given to Arbie Cookey, nurse at [facility].   Gavin Potters

## 2015-11-07 NOTE — Discharge Instructions (Signed)
Hospice °Hospice is a service that is designed to provide people who are terminally ill and their families with medical, spiritual, and psychological support. Its aim is to improve your quality of life by keeping you as alert and comfortable as possible. Hospice is performed by a team of health care professionals and volunteers who: °· Help keep you comfortable. Hospice can be provided in your home or in a homelike setting. The hospice staff works with your family and friends to help meet your needs. You will enjoy the support of loved ones by receiving much of your basic care from family and friends. °· Provide pain relief and manage your symptoms. The staff supply all necessary medicines and equipment. °· Provide companionship when you are alone. °· Allow you and your family to rest. They may do light housekeeping, prepare meals, and run errands. °· Provide counseling. They will make sure your emotional, spiritual, and social needs and those of your family are being met. °· Provide spiritual care. Spiritual care is individualized to meet your needs and your family's needs. It may involve helping you look at what death means to you, say goodbye, or perform a specific religious ceremony or ritual. °Hospice teams often include: °· A nurse. °· A doctor. °· Social workers. °· Religious leaders (such as a chaplain). °· Trained volunteers. °WHEN SHOULD HOSPICE CARE BEGIN? °Most people who use hospice are believed to have fewer than 6 months to live. Your family and health care providers can help you decide when hospice services should begin. If your condition improves, you may discontinue the program. °WHAT SHOULD I CONSIDER BEFORE SELECTING A PROGRAM? °Most hospice programs are run by nonprofit, independent organizations. Some are affiliated with hospitals, nursing homes, or home health care agencies. Hospice programs can take place in the home or at a hospice center, hospital, or skilled nursing facility. When choosing  a hospice program, ask the following questions: °· What services are available to me? °· What services are offered to my loved ones? °· How involved are my loved ones? °· How involved is my health care provider? °· Who makes up the hospice care team? How are they trained or screened? °· How will my pain and symptoms be managed? °· If my circumstances change, can the services be provided in a different setting, such as my home or in the hospital? °· Is the program reviewed and licensed by the state or certified in some other way? °WHERE CAN I LEARN MORE ABOUT HOSPICE? °You can learn about existing hospice programs in your area from your health care providers. You can also read more about hospice online. The websites of the following organizations contain helpful information: °· The National Hospice and Palliative Care Organization (NHPCO). °· The Hospice Association of America (HAA). °· The Hospice Education Institute. °· The American Cancer Society (ACS). °· Hospice Net. °  °This information is not intended to replace advice given to you by your health care provider. Make sure you discuss any questions you have with your health care provider. °  °Document Released: 03/14/2004 Document Revised: 12/01/2013 Document Reviewed: 10/06/2013 °Elsevier Interactive Patient Education ©2016 Elsevier Inc. ° °

## 2015-11-07 NOTE — Progress Notes (Signed)
Family Medicine Teaching Service Daily Progress Note Intern Pager: [phone]  Patient name: Edwin Love Medical record number: [id number] Date of birth: [birth year] Age: 54 y.o. Gender: male  Primary Care Provider: Montey Hora, PA-C Consultants: Nephrology Code Status: Full  Pt Overview and Major Events to Date:  11/24: US abdomen showing 3.4cm mass 11/25: MRI abdomen showed liver lesion c/w [facility]  Assessment and Plan: 54 y.o. male presenting with dyspnea and AMS and was found to be in renal failure, found to have liver lesion c/w [facility] therefore requesting comfort care. PMH is significant for CKD IV, cirrhosis with esophageal/rectal varices s/p TIPS with history of alcohol abuse and hepatitis C, HFpEF with grade 2 diastolic dysfunction, HTN, chronic anemia and thrombocytopenia, tobacco abuse and depression.   # Renal failure in setting of known CKD, metabolic acidosis: SCr Q000111Q in ED. Baseline ~ 3. Likely secondary to worsening cirrhosis and portal hypertension. Dehydration also contributing, given 3rd spacing as ascites present and because patient reports reduced PO intake. Granular casts present on UA, consistent with chronic kidney disease. Bicarb 6 improved to 10 on 11/25 -Strict I/Os.  -Nephrology consulted, appreciate recommendations. Recommend comfort care.  # Cirrhosis: Abdominal distension at baseline, per family. Patient had TIPS procedure. Ammonia elevated at 105. Lactic acid elevated to 2.30, suggesting poor hepatic clearance. Albumin 2.3. Total protein 9.1.Marland Kitchen - Lactulose 20 mg TID given confusion. Titrate with stooling.  - Continue to monitor mental status  # Liver mass: 3.4cm mass seen on abdominal ultrasound - MRI abdomen w and wo contrast >> concern for [facility] - Palliative care consulted, appreciate recommendations. Comfort care, status to DNR, Transfer to in-patient hospice, cont PO meds as tolerated, d/c IV abx but agreeable to continuing PO antibiotics, cont IVF as  needed until DC - SW spoke with daughter, interested in [facility]. Patient is eligible and will be transferred today   # Dyspnea: CXR negative for infiltrate. suspect some fluid overload with worsened kidney function, missed lasix doses; however no real appreciable crackles at bases. Echo May 2016 EF 60-65%, G1DD. ABG with pH 7.257 / pCO2 11.5 / pO2 0000000 = metabolic acidosis with failed compensation likely contributing (increased respiratory rate) - on room air with good sats - Oxygen via [place] PRN - Continue to monitor  # EtOH abuse: Patient states last drink was about 1 week ago. Ethanol level <5 in the ED. UDS also negative. - CIWA protocol (0>2>0)  FEN/GI: Renal diet, limit fluids Prophylaxis: SubQ heparin  Disposition: comfort care at [facility] today   Subjective:  Feeling okay today. Notes mild generalized abdominal pain. Feels tired. No further complaints.  Objective: Temp:  [97.4 F (36.3 C)-98.1 F (36.7 C)] 98.1 F (36.7 C) (11/27 2120) Pulse Rate:  [71-98] 98 (11/27 2120) Resp:  [20] 20 (11/27 2120) BP: (100-135)/(64-84) 100/84 mmHg (11/27 2120) SpO2:  [100 %] 100 % (11/27 2120) Physical Exam: General: 54yo male resting comfortably in no apparent distress, sitting in bed Cardiovascular: regular rate and rhythm, no murmurs noted Respiratory: mild basilar crackles noted  Abdomen: soft, NT. Ascites. Normal bowel sounds Extremities: no LE edema Neuro: oriented x3/alert.  Laboratory:  Recent Labs Lab 11/03/15 1326 11/05/15 1300 11/07/15 0515  WBC 12.4* 7.7 7.6  HGB 9.8* 8.4* 8.7*  HCT 29.6* 25.2* 25.3*  PLT 139* 88* 81*    Recent Labs Lab 11/03/15 1326 11/04/15 0420 11/05/15 1300  NA 138 140 136  K 4.9 3.9 3.4*  CL 115* 113* 106  CO2 6* 10* 15*  BUN 160* 155* 132*  CREATININE 9.24* 8.57* 7.07*  CALCIUM 9.6 8.9 9.0  PROT 9.1*  --  7.3  BILITOT 0.9  --  1.2  ALKPHOS 119  --  97  ALT 18  --  18  AST 31  --  38  GLUCOSE 134* 143* 194*   AFP:  4.7  Imaging/Diagnostic Tests: Mr Abdomen Wo Contrast  11/05/2015  CLINICAL DATA:  Cirrhosis. Portal hypertension. Hypoechoic mass identified on recent ultrasound. MRI recommended for further characterization. Exam was performed without IV contrast. Patient was uncooperative with breathing instructions. Suboptimal exam. EXAM: MRI ABDOMEN WITHOUT CONTRAST TECHNIQUE: Multiplanar multisequence MR imaging was performed without the administration of intravenous contrast. Exam was performed without IV contrast. Patient was uncooperative with breathing instructions. Limited exam. COMPARISON:  Ultrasound 11/03/2015, MRI 07/29/2014 FINDINGS: Lower chest: Pleural thickening versus atelectasis in the RIGHT lung base (image 4, series 4) Hepatobiliary: There is a new round lesion in the inferior RIGHT hepatic lobe measuring 2.6 cm which is intermediate hyperintense on T2 weighted imaging (image 31, series 4). Liver has a nodular contour with enlargement of the caudate lobe. No ascites. No biliary duct dilatation. Tips shunt noted.  Patency cannot be assessed without contrast. The gallbladder is normal.  The common bile duct normal. Pancreas: Normal pancreatic parenchymal intensity. No ductal dilatation or inflammation. Spleen: Normal spleen. Adrenals/urinary tract: Adrenal glands and kidneys are normal. Stomach/Bowel: Stomach and limited of the small bowel is unremarkable Vascular/Lymphatic: Abdominal aortic normal caliber. No retroperitoneal periportal lymphadenopathy. Musculoskeletal: No aggressive osseous lesion IMPRESSION: 1. New round lesion in the RIGHT hepatic lobe is most consistent with hepatocellular carcinoma given underline cirrhosis. 2. Morphologic changes consists with cirrhosis. Tips shunt catheter noted. No ascites. 3. Pleural thickening versus atelectasis at the RIGHT lung base not completely evaluated. 4. Suboptimal exam due to patient motion lack of IV contrast. Electronically Signed   By: Suzy Bouchard M.D.   On: 11/05/2015 09:52   US Abdomen Complete  11/03/2015  CLINICAL DATA:  Alcoholic cirrhosis.  Renal failure.  Ascites. EXAM: ULTRASOUND ABDOMEN COMPLETE COMPARISON:  06/09/2015 FINDINGS: Gallbladder: No gallstones identified. Gallbladder is nondilated. Mild diffuse gallbladder wall thickening is seen measuring up to 6 mm. This is nonspecific. No sonographic Murphy sign noted by sonographer. Common bile duct: Diameter: 4 mm, within normal limits. Liver: Diffusely coarsened echotexture, consistent with cirrhosis. A hypoechoic mass is seen in the left hepatic lobe measuring 3.4 cm, which is suspicious for hepatocellular carcinoma in the setting of cirrhosis. There is also a 1 cm hyperechoic lesion in the right hepatic lobe, with differential diagnosis including regenerative nodule, hepatocellular carcinoma, and hemangioma. IVC: No abnormality visualized. Pancreas: Visualized portion unremarkable. Spleen: Size and appearance within normal limits. Right Kidney: Length: 9.2 cm. Echogenicity within normal limits. No mass or hydronephrosis visualized. Left Kidney: Length: 11.0 cm. Echogenicity within normal limits. No mass or hydronephrosis visualized. Abdominal aorta: No aneurysm visualized. Other findings: No evidence of ascites. IMPRESSION: No evidence of gallstones or biliary ductal dilatation. Mild diffuse gallbladder wall thickening is nonspecific and may be secondary to cirrhosis. Coarse hepatic echotexture, consistent with cirrhosis. 3.4 cm hypoechoic mass in left hepatic lobe, and 1 cm hyperechoic mass in right hepatic lobe. Hepatocellular carcinomas cannot be excluded in setting of cirrhosis. Abdomen MRI without and with contrast recommended for further characterization. No evidence of ascites. Electronically Signed   By: Earle Gell M.D.   On: 11/03/2015 21:15   Dg Chest Portable 1 View  11/03/2015  CLINICAL DATA:  Generalized weakness for 2 weeks with productive cough EXAM: PORTABLE CHEST  - 1 VIEW COMPARISON:  04/18/2015 FINDINGS: The heart size and mediastinal contours are within normal limits. Both lungs are clear. The visualized skeletal structures are unremarkable. IMPRESSION: No active disease. Electronically Signed   By: Inez Catalina M.D.   On: 11/03/2015 13:43    Smiley Houseman, MD 11/07/2015, 6:53 AM PGY-1, [place] Intern pager: [phone], text pages welcome

## 2015-11-07 NOTE — Progress Notes (Signed)
Irving Copas to be D/C'd Nursing Home per MD order.     Medication List    STOP taking these medications        amLODipine 10 MG tablet  Commonly known as:  NORVASC      TAKE these medications        calcitRIOL 0.25 MCG capsule  Commonly known as:  ROCALTROL  Take 0.25 mcg by mouth every Monday, Wednesday, and Friday.     carvedilol 25 MG tablet  Commonly known as:  COREG  Take 1 tablet (25 mg total) by mouth 2 (two) times daily with a meal.     furosemide 40 MG tablet  Commonly known as:  LASIX  Take 1 tablet (40 mg total) by mouth 2 (two) times daily.     haloperidol lactate 5 MG/ML injection  Commonly known as:  HALDOL  Inject 0.2-0.4 mLs (1-2 mg total) into the vein every 6 (six) hours as needed (nausea).     HYDROmorphone 1 MG/ML injection  Commonly known as:  DILAUDID  Inject 0.25-0.5 mLs (0.25-0.5 mg total) into the vein every 4 (four) hours as needed for moderate pain (dyspnea).     lactulose 10 GM/15ML solution  Commonly known as:  CHRONULAC  Take 30 mLs (20 g total) by mouth 2 (two) times daily.     LORazepam 2 MG/ML injection  Commonly known as:  ATIVAN  Inject 0.5-1 mLs (1-2 mg total) into the vein every 4 (four) hours as needed for anxiety or seizure.     potassium chloride 10 MEQ tablet  Commonly known as:  K-DUR  Take 1 tablet (10 mEq total) by mouth daily.        Filed Vitals:   11/06/15 2120 11/07/15 1100  BP: 100/84 93/51  Pulse: 98 68  Temp: 98.1 F (36.7 C) 99.4 F (37.4 C)  Resp: 20 22   Pt denies pain at this time. No complaints noted. PT discharged with IV to [facility].     Carole Civil RN [place] [facility] [address] Phone [phone]

## 2015-11-07 NOTE — Clinical Social Work Note (Signed)
Patient approved for [facility] and will discharge to facility today, transported by ambulance. Call made to patient's daughter, Francia Greaves [phone]) and informed her that transport called.   Loyce Klasen Givens, MSW, LCSW Licensed Clinical Social Worker [place] [phone]

## 2015-11-07 NOTE — Consult Note (Signed)
[facility] Liaison:  [facility] available today for Edwin Love. Daughter completed paper work yesterday.   Please fax discharge summary to [phone].  RN please call report to [phone].  Please arrange transport for Edwin Love to arrive as early as possible.  Thank you.   Erling Conte, [place]

## 2015-11-07 NOTE — Consult Note (Signed)
[facility] [employer]  Received request from [place] 11/06/15 for family interest in [facility]. Per Butch Penny, she spoke with patient's daughter Arbie Cookey by phone and confirmed interest and desire for [facility] transfer. Butch Penny provided me with the number to call Arbie Cookey. When I spoke with Arbie Cookey by phone she was not available to come to hospital until after 5 PM. Met with Arbie Cookey briefly at 5 PM. Arbie Cookey is working this morning and is expecting a call from me to confirm if patient is eligible. Will update CSW after [facility] MD reviews clinical information.   Thank  You. Erling Conte, [place]

## 2015-11-07 NOTE — Discharge Summary (Signed)
[facility] Discharge Summary  Patient name: Edwin Love Medical record number: [id number] Date of birth: November 30, 1961 Age: 54 y.o. Gender: male Date of Admission: 11/03/2015  Date of Discharge: 11/07/15 Admitting Physician: Dickie La, MD  Primary Care Provider: Montey Hora, PA-C Consultants: Palliative Care   Indication for Hospitalization:   Discharge Diagnoses/Problem List:  ESLD ESRD EtOH Use  Disposition: [place]   Discharge Condition: stable   Discharge Exam:  Filed Vitals:   11/06/15 2120 11/07/15 1100  BP: 100/84 93/51  Pulse: 98 68  Temp: 98.1 F (36.7 C) 99.4 F (37.4 C)  Resp: 20 22  General: 54yo male resting comfortably in no apparent distress, sitting in bed Cardiovascular: regular rate and rhythm, no murmurs noted Respiratory: mild basilar crackles noted  Abdomen: soft, NT. Ascites. Normal bowel sounds Extremities: no LE edema Neuro: oriented x3/alert.  Brief Hospital Course:   Edwin Love is a 54 y.o. male presenting with dyspnea and AMS and was found to be in renal failure. PMH is significant for CKD IV, cirrhosis with esophageal/rectal varices s/p TIPS with history of alcohol abuse and hepatitis C, HFpEF with grade 2 diastolic dysfunction, HTN, chronic anemia and thrombocytopenia, tobacco abuse and depression.   Renal failure in setting of known CKD, metabolic acidosis: Serum Creatinine was 9.24 on admit (basline ~3). Thought to be likely secondary to worsening cirrhosis and portal hypertension. Nephrology was consulted, and noted patient was not a candidate for Dialysis due to ESLD. Recommended IV bicarb, which was started and eventually discontinued. Please note below regarding comfort care.   Cirrhosis:  Patient had TIPS procedure. Ammonia elevated at 105 and Lactic acid elevated to 2.30 on admission, suggesting poor hepatic clearance. A new liver mass at 3.4cm was seen on abdominal ultrasound.  MRI abdomen showed findings concerning for [place]. Palliative care was consulted: Patient and family decided on comfort care. Patient chose to be DNR/DNI. Patient and family chose [facility] for inpatient hospice care. Per patient, will continue PO medications as tolerated. Palliative care recommended Dialudid, Ativan, and Haldol for pain/anxitey/agitation.   Dyspnea:  On admission, CXR was negative for infiltrate. Symptoms were thought to be due to metabolic acidosis with failed compensation at ABG on admission noted: pH 7.257 / pCO2 11.5 / pO2 125. Patient was given IV bicarb per nephrology recommendations as noted above.    Issues for Follow Up:  - Home Norvasc was held during hospitalization and at discharge due to normal/soft blood pressures   Significant Procedures: none  Significant Labs and Imaging:   Recent Labs Lab 11/03/15 1326 11/05/15 1300 11/07/15 0515  WBC 12.4* 7.7 7.6  HGB 9.8* 8.4* 8.7*  HCT 29.6* 25.2* 25.3*  PLT 139* 88* 81*    Recent Labs Lab 11/03/15 1326 11/04/15 0420 11/05/15 1300  NA 138 140 136  K 4.9 3.9 3.4*  CL 115* 113* 106  CO2 6* 10* 15*  GLUCOSE 134* 143* 194*  BUN 160* 155* 132*  CREATININE 9.24* 8.57* 7.07*  CALCIUM 9.6 8.9 9.0  PHOS  --  7.9* 6.5*  ALKPHOS 119  --  97  AST 31  --  38  ALT 18  --  18  ALBUMIN 2.3* 2.8* 3.3*   CXR:  FINDINGS: The heart size and mediastinal contours are within normal limits. Both lungs are clear. The visualized skeletal structures are unremarkable.  IMPRESSION: No active disease.  US Abdomen:   CLINICAL DATA: Alcoholic cirrhosis. Renal failure. Ascites.  EXAM: ULTRASOUND ABDOMEN  COMPLETE  COMPARISON: 06/09/2015  FINDINGS: Gallbladder: No gallstones identified. Gallbladder is nondilated. Mild diffuse gallbladder wall thickening is seen measuring up to 6 mm. This is nonspecific. No sonographic Murphy sign noted by sonographer.  Common bile duct: Diameter: 4 mm, within normal  limits.  Liver: Diffusely coarsened echotexture, consistent with cirrhosis. A hypoechoic mass is seen in the left hepatic lobe measuring 3.4 cm, which is suspicious for hepatocellular carcinoma in the setting of cirrhosis. There is also a 1 cm hyperechoic lesion in the right hepatic lobe, with differential diagnosis including regenerative nodule, hepatocellular carcinoma, and hemangioma.  IVC: No abnormality visualized.  Pancreas: Visualized portion unremarkable.  Spleen: Size and appearance within normal limits.  Right Kidney: Length: 9.2 cm. Echogenicity within normal limits. No mass or hydronephrosis visualized.  Left Kidney: Length: 11.0 cm. Echogenicity within normal limits. No mass or hydronephrosis visualized.  Abdominal aorta: No aneurysm visualized.  Other findings: No evidence of ascites.  IMPRESSION: No evidence of gallstones or biliary ductal dilatation. Mild diffuse gallbladder wall thickening is nonspecific and may be secondary to cirrhosis.  Coarse hepatic echotexture, consistent with cirrhosis. 3.4 cm hypoechoic mass in left hepatic lobe, and 1 cm hyperechoic mass in right hepatic lobe. Hepatocellular carcinomas cannot be excluded in setting of cirrhosis. Abdomen MRI without and with contrast recommended for further characterization.  No evidence of ascites.     MRI Abdomen: IMPRESSION: 1. New round lesion in the RIGHT hepatic lobe is most consistent with hepatocellular carcinoma given underline cirrhosis. 2. Morphologic changes consists with cirrhosis. Tips shunt catheter noted. No ascites. 3. Pleural thickening versus atelectasis at the RIGHT lung base not completely evaluated. 4. Suboptimal exam due to patient motion lack of IV contrast.  Results/Tests Pending at Time of Discharge: none  Discharge Medications:    Medication List    STOP taking these medications        amLODipine 10 MG tablet  Commonly known as:  NORVASC       TAKE these medications        calcitRIOL 0.25 MCG capsule  Commonly known as:  ROCALTROL  Take 0.25 mcg by mouth every Monday, Wednesday, and Friday.     carvedilol 25 MG tablet  Commonly known as:  COREG  Take 1 tablet (25 mg total) by mouth 2 (two) times daily with a meal.     furosemide 40 MG tablet  Commonly known as:  LASIX  Take 1 tablet (40 mg total) by mouth 2 (two) times daily.     haloperidol lactate 5 MG/ML injection  Commonly known as:  HALDOL  Inject 0.2-0.4 mLs (1-2 mg total) into the vein every 6 (six) hours as needed (nausea).     HYDROmorphone 1 MG/ML injection  Commonly known as:  DILAUDID  Inject 0.25-0.5 mLs (0.25-0.5 mg total) into the vein every 4 (four) hours as needed for moderate pain (dyspnea).     lactulose 10 GM/15ML solution  Commonly known as:  CHRONULAC  Take 30 mLs (20 g total) by mouth 2 (two) times daily.     LORazepam 2 MG/ML injection  Commonly known as:  ATIVAN  Inject 0.5-1 mLs (1-2 mg total) into the vein every 4 (four) hours as needed for anxiety or seizure.     potassium chloride 10 MEQ tablet  Commonly known as:  K-DUR  Take 1 tablet (10 mEq total) by mouth daily.        Discharge Instructions: Please refer to Patient Instructions section of EMR for full details.  Patient was counseled important signs and symptoms that should prompt return to medical care, changes in medications, dietary instructions, activity restrictions, and follow up appointments.   Follow-Up Appointments:   Smiley Houseman, MD 11/07/2015, 12:25 PM PGY-1, [place]

## 2015-11-08 LAB — CULTURE, BLOOD (ROUTINE X 2): CULTURE: NO GROWTH

## 2015-12-11 DEATH — deceased

## 2016-07-13 IMAGING — NM NM GI BLOOD LOSS
2 series · 12 of 12 positions shown · non-contrast
Comparison: CT the abdomen pelvis - 06/24/2014; 02/08/2014

CLINICAL DATA: Bloody stools since yesterday. Evaluate for GI
bleed.

EXAM:
NUCLEAR MEDICINE GASTROINTESTINAL BLEEDING SCAN
TECHNIQUE: Sequential abdominal images were obtained following intravenous
administration of Ic-44m labeled red blood cells.
RADIOPHARMACEUTICALS:  25 mCi Ic-44m in-vitro labeled red cells.

[gi gi bleed · 5.01mm/px · 6 of 60 frames shown (1 of 2)]
[frame 6/60]
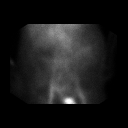
[frame 16/60]
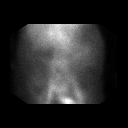
[frame 26/60]
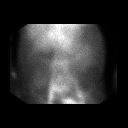
[frame 36/60]
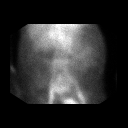
[frame 46/60]
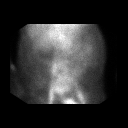
[frame 56/60]
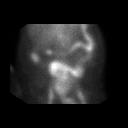

[gi gi bleed · 5.01mm/px · 6 of 60 frames shown (2 of 2)]
[frame 6/60]
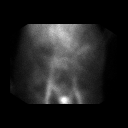
[frame 16/60]
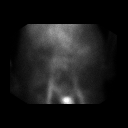
[frame 26/60]
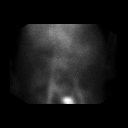
[frame 36/60]
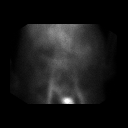
[frame 46/60]
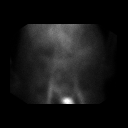
[frame 56/60]
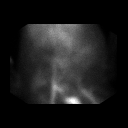

[12 of 12 positions shown; findings below may reference images not displayed]

FINDINGS: The examination is degraded secondary to patient motion artifact.
There is a faint ill-defined area of persistent radiotracer activity
within the right lower abdominal quadrant seen on the initial 60 min
of planar imaging. There is a rapid worsening of this radiotracer
seen on the provided 110 min anterior projection planar images with
subsequent retrograde flow into the distal sigmoid, ascending and
transverse colon.

Expected physiologic activity is seen within the hepatic parenchyma
an intravascular blood pool of the abdomen and pelvis. Excreted free
radiotracer is seen within the urinary bladder.
IMPRESSION: Examination is positive for acute lower GI bleed with source likely
within the sigmoid colon.

Critical Value/emergent results were called by telephone at the time
of interpretation on 06/24/2014 at [DATE] to Dr. Dapilaa Liggi, who
verbally acknowledged these results.

## 2016-07-13 IMAGING — CR DG CHEST 1V PORT
1 series · 1 of 1 positions shown · non-contrast
Comparison: PA and lateral chest x-ray January 21, 2014

CLINICAL DATA: Shortness of breath and hypotension

EXAM:
PORTABLE CHEST - 1 VIEW

[AP]
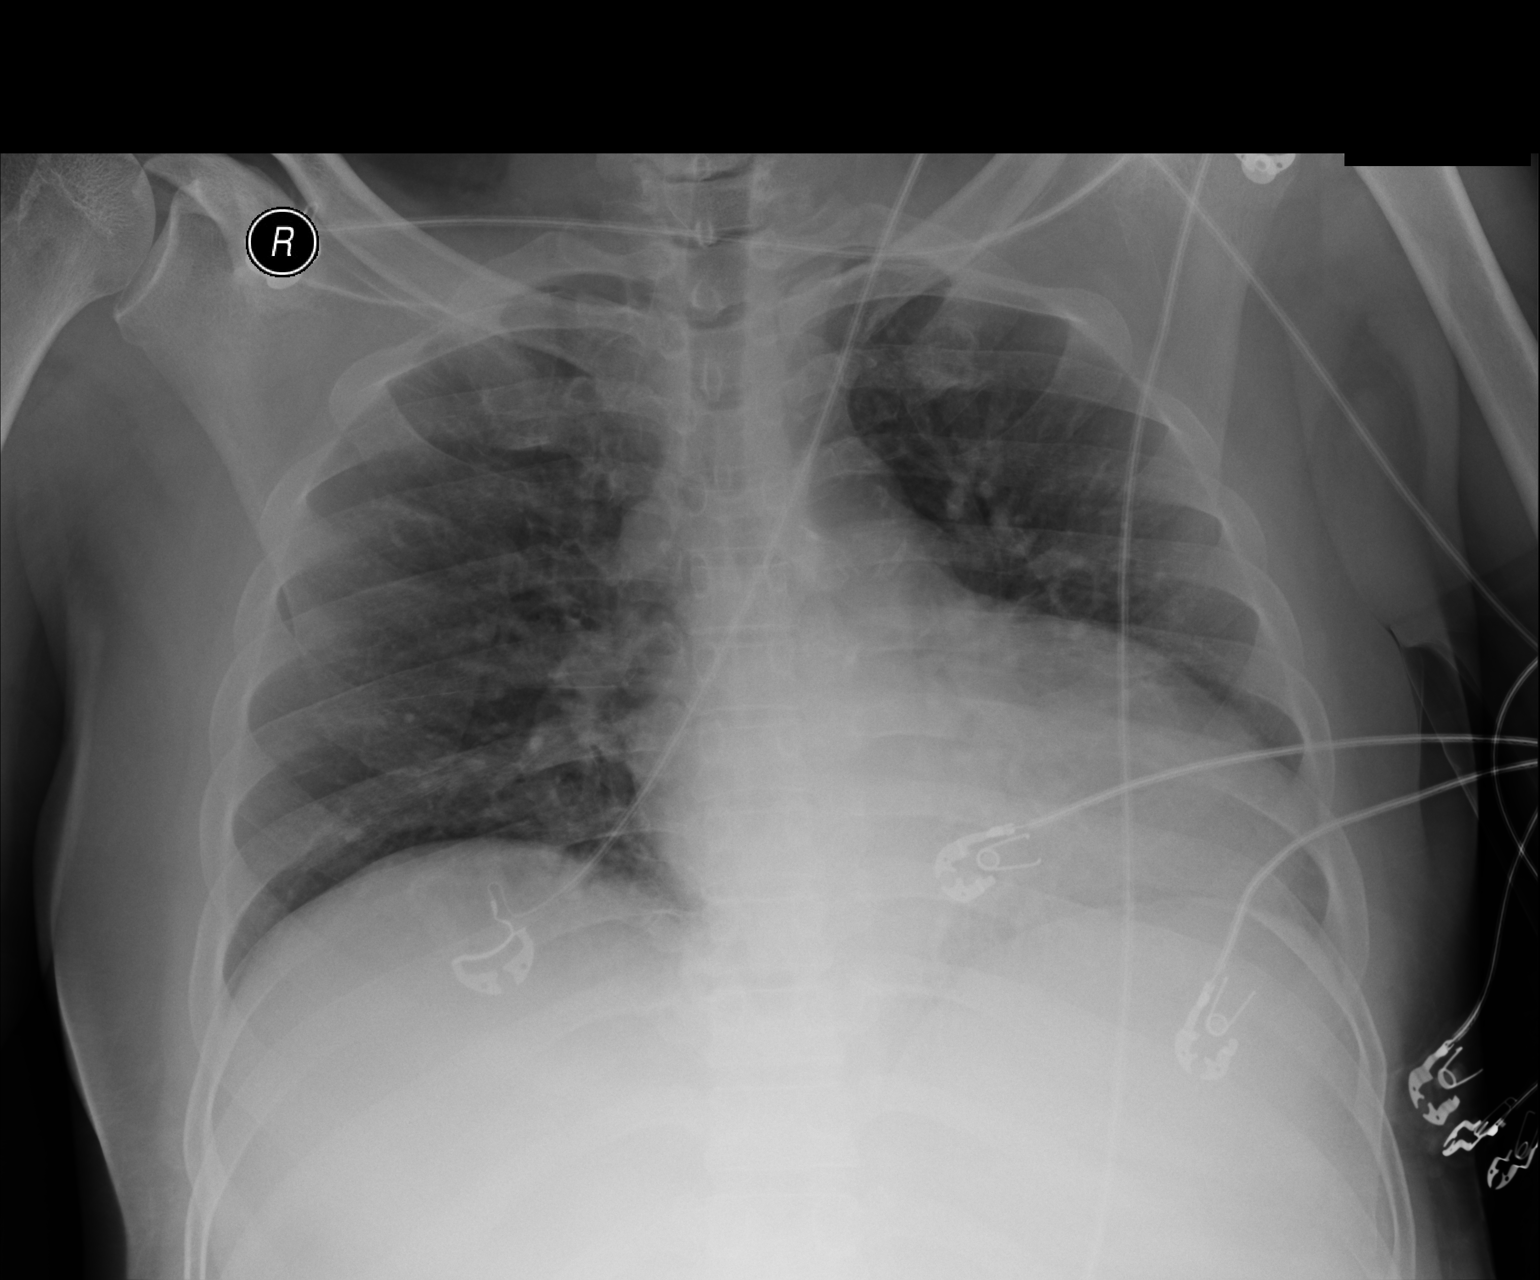

[1 of 1 positions shown; findings below may reference images not displayed]

FINDINGS: The lungs are hypoinflated but clear. The cardiac silhouette is
mildly enlarged. The pulmonary vascularity is normal. There is no
pleural effusion. The bony thorax is unremarkable.
IMPRESSION: The study is limited due to hypoinflation. There is new enlargement
of the cardiac silhouette without evidence of pulmonary edema.

## 2016-07-14 IMAGING — CR DG ABD PORTABLE 1V
2 series · 2 of 2 positions shown · non-contrast
Comparison: Portable chest radiograph 2120 hr the same day and
earlier.

CLINICAL DATA: 53-year-old male undergoing enteric tube placement.

EXAM:
PORTABLE ABDOMEN - 1 VIEW

[AP (1 of 2)]
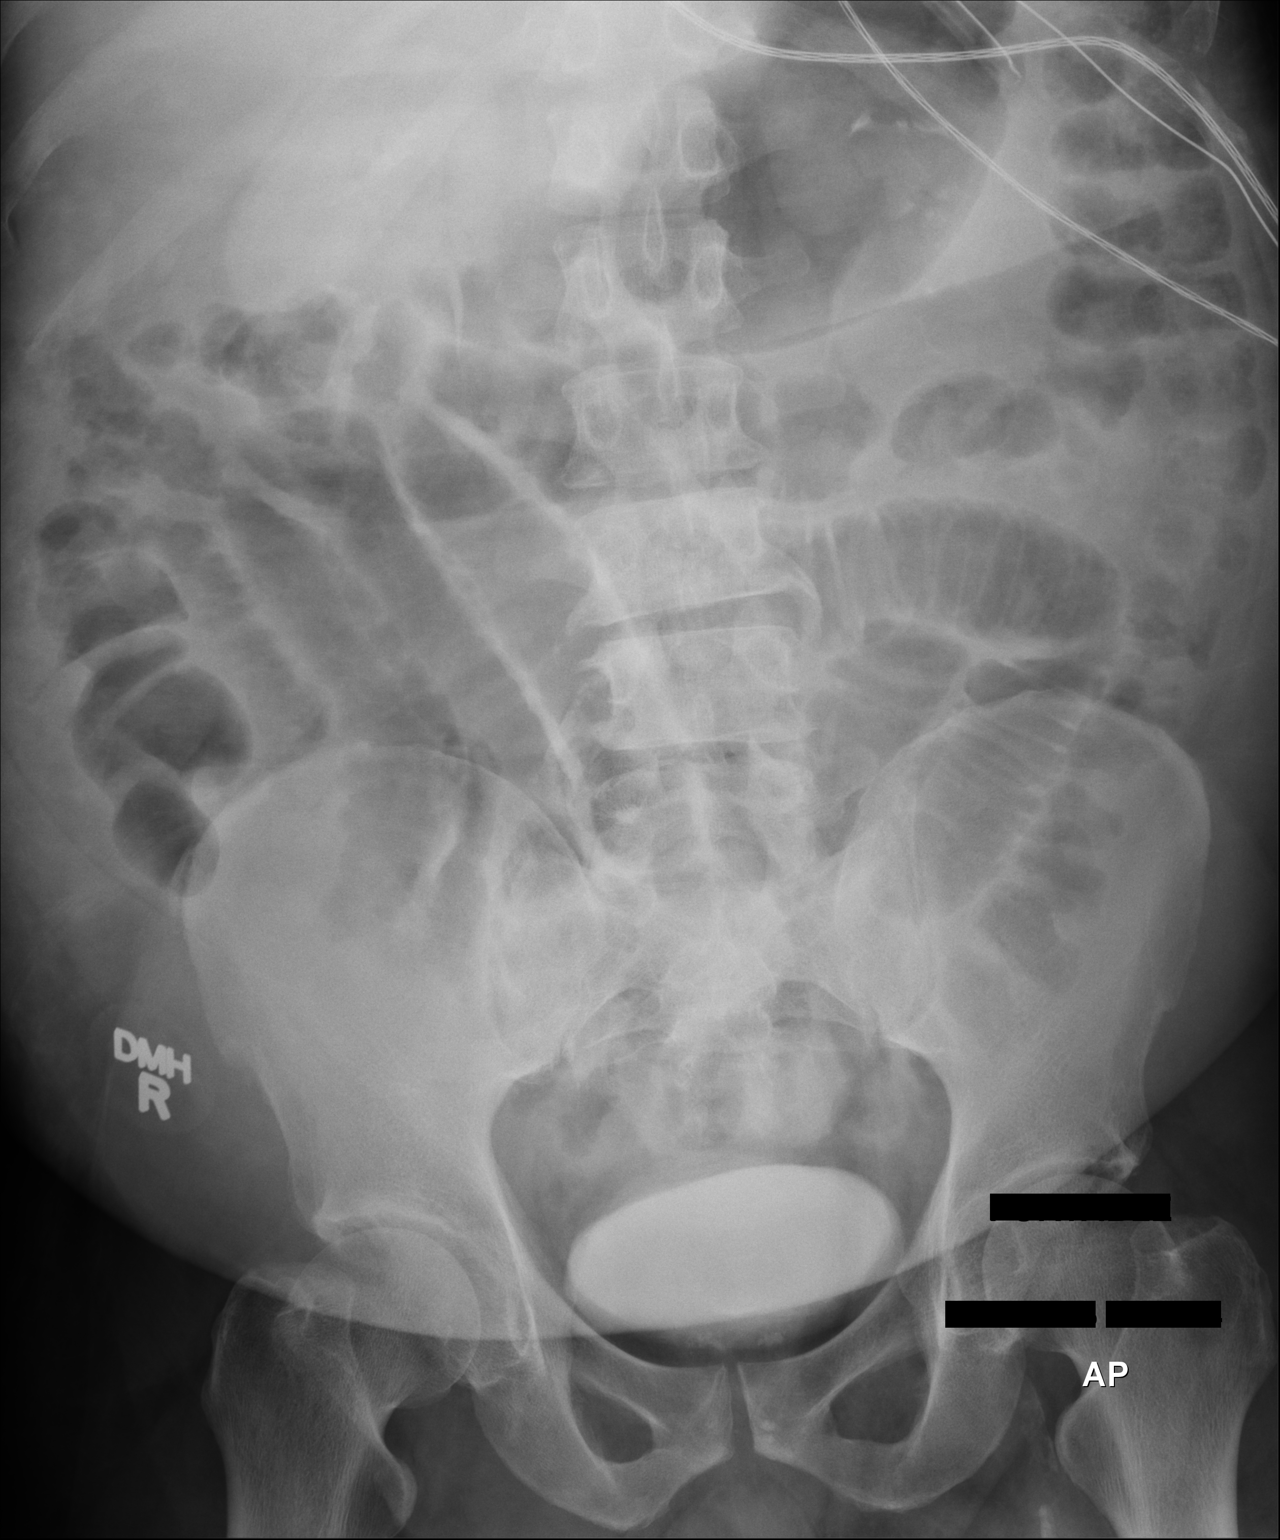

[AP (2 of 2)]
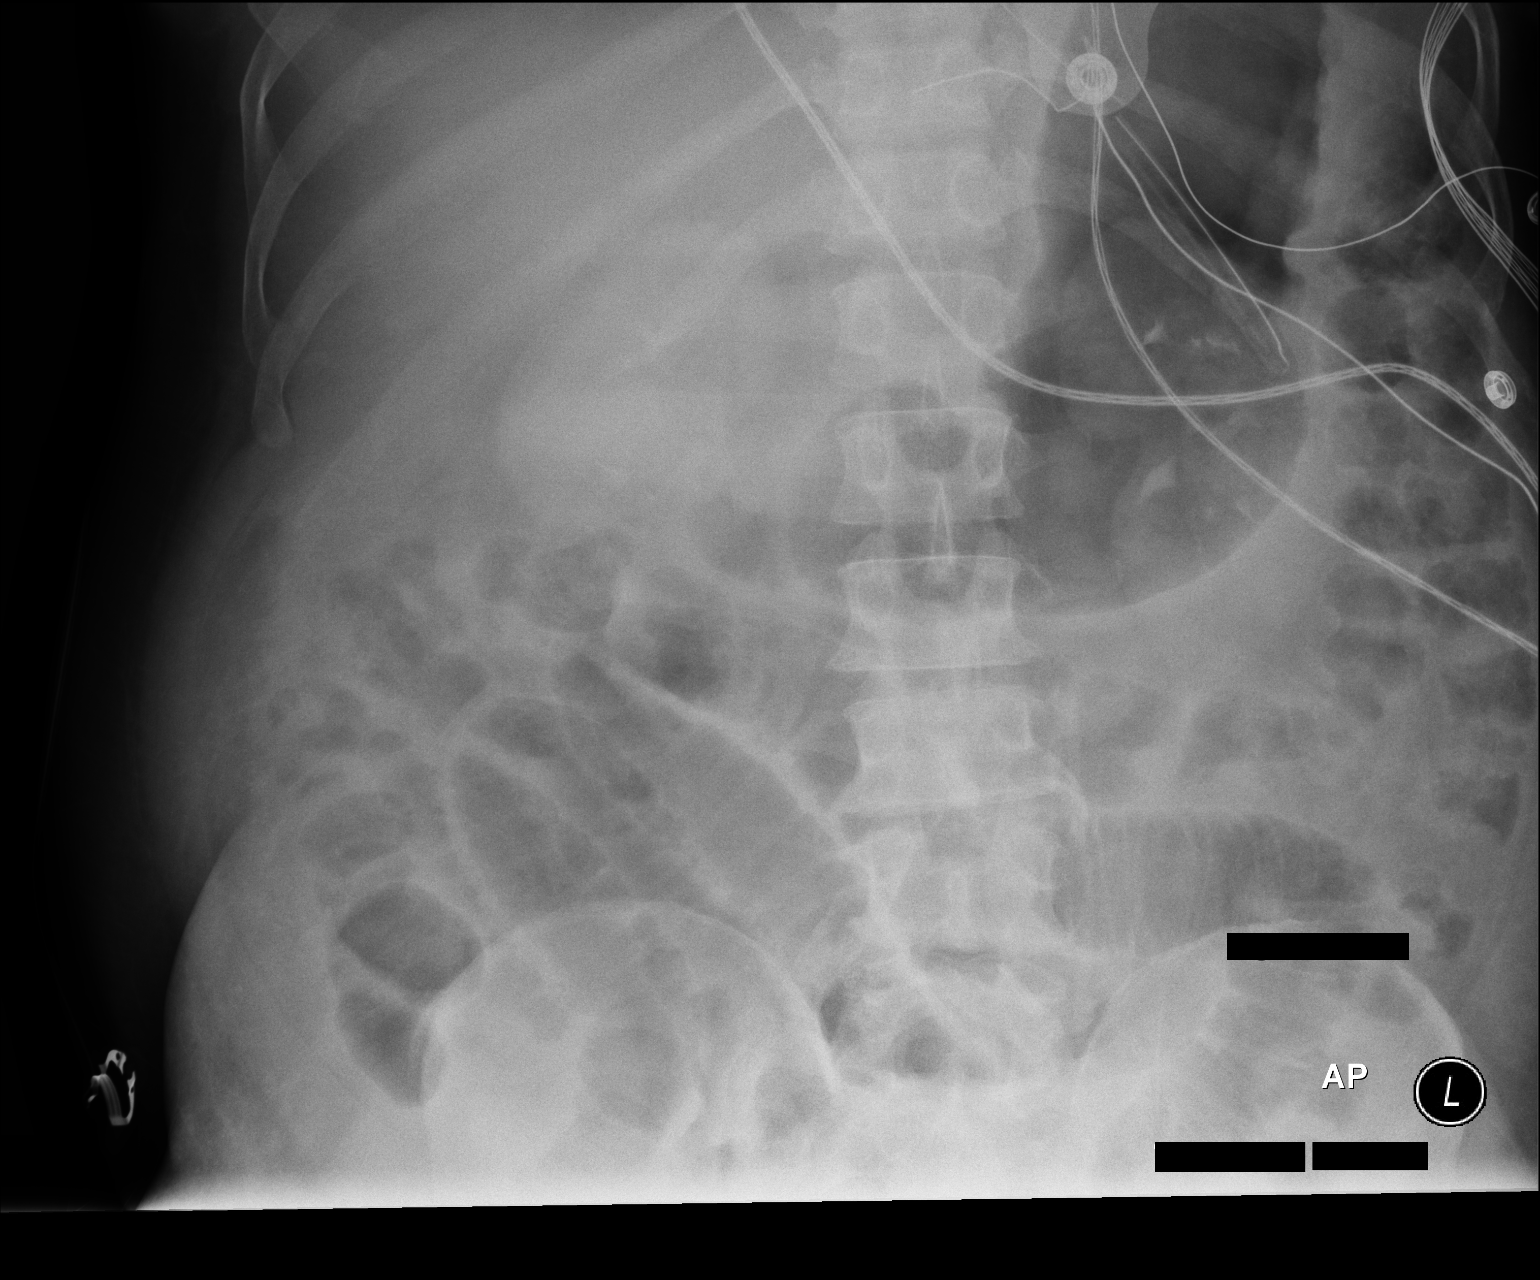

[2 of 2 positions shown; findings below may reference images not displayed]

FINDINGS: Two portable AP views of the abdomen at 4060 hrs. Enteric tube
projects over the proximal stomach, side hole at the level of the
gastric fundus.

Increased dilated gas-filled small bowel loops, measuring up to 39
mm diameter. Similar volume of large bowel gas compared to recent CT
Abdomen and Pelvis. Excreted IV contrast in the bladder. No definite
pneumoperitoneum. No acute osseous abnormality identified.
IMPRESSION: 1. Enteric tube placed, side hole at the level of the gastric
fundus.
2. New small bowel obstruction gas pattern.  No definite free air.

## 2016-12-12 ENCOUNTER — Encounter: Payer: Self-pay | Admitting: Interventional Radiology
